# Patient Record
Sex: Male | Born: 1960 | Race: White | Hispanic: No | Marital: Single | Smoking: Never smoker
Health system: Southern US, Community
[De-identification: ages and names within clinical notes are randomized; demographics above are authoritative.]

## PROBLEM LIST (undated history)

## (undated) DIAGNOSIS — E119 Type 2 diabetes mellitus without complications: Secondary | ICD-10-CM

## (undated) DIAGNOSIS — K219 Gastro-esophageal reflux disease without esophagitis: Secondary | ICD-10-CM

## (undated) DIAGNOSIS — M1990 Unspecified osteoarthritis, unspecified site: Secondary | ICD-10-CM

## (undated) DIAGNOSIS — E7800 Pure hypercholesterolemia, unspecified: Secondary | ICD-10-CM

## (undated) DIAGNOSIS — M869 Osteomyelitis, unspecified: Secondary | ICD-10-CM

## (undated) HISTORY — PX: AMPUTATION TOE: SHX6595

## (undated) HISTORY — PX: EXTERNAL EAR SURGERY: SHX627

## (undated) HISTORY — PX: ADENOIDECTOMY: SUR15

## (undated) HISTORY — PX: ELBOW FRACTURE SURGERY: SHX616

---

## 2017-07-19 ENCOUNTER — Encounter: Payer: Self-pay | Admitting: *Deleted

## 2017-07-19 ENCOUNTER — Ambulatory Visit
Admission: EM | Admit: 2017-07-19 | Discharge: 2017-07-19 | Disposition: A | Discharge status: Home / Self Care | Payer: Managed Care, Other (non HMO) | Attending: Family | Admitting: Family

## 2017-07-19 ENCOUNTER — Ambulatory Visit (INDEPENDENT_AMBULATORY_CARE_PROVIDER_SITE_OTHER): Payer: Managed Care, Other (non HMO)

## 2017-07-19 DIAGNOSIS — L03119 Cellulitis of unspecified part of limb: Secondary | ICD-10-CM | POA: Diagnosis not present

## 2017-07-19 DIAGNOSIS — L02419 Cutaneous abscess of limb, unspecified: Secondary | ICD-10-CM

## 2017-07-19 DIAGNOSIS — E1159 Type 2 diabetes mellitus with other circulatory complications: Secondary | ICD-10-CM

## 2017-07-19 DIAGNOSIS — M79672 Pain in left foot: Secondary | ICD-10-CM | POA: Diagnosis present

## 2017-07-19 DIAGNOSIS — M79671 Pain in right foot: Secondary | ICD-10-CM | POA: Diagnosis present

## 2017-07-19 DIAGNOSIS — E11621 Type 2 diabetes mellitus with foot ulcer: Secondary | ICD-10-CM | POA: Diagnosis not present

## 2017-07-19 DIAGNOSIS — L97518 Non-pressure chronic ulcer of other part of right foot with other specified severity: Secondary | ICD-10-CM | POA: Diagnosis not present

## 2017-07-19 DIAGNOSIS — L97528 Non-pressure chronic ulcer of other part of left foot with other specified severity: Secondary | ICD-10-CM

## 2017-07-19 HISTORY — DX: Type 2 diabetes mellitus without complications: E11.9

## 2017-07-19 LAB — URINALYSIS, COMPLETE (UACMP) WITH MICROSCOPIC
Bacteria, UA: NONE SEEN
Bilirubin Urine: NEGATIVE
GLUCOSE, UA: 500 mg/dL — AB
HGB URINE DIPSTICK: NEGATIVE
Ketones, ur: NEGATIVE mg/dL
Leukocytes, UA: NEGATIVE
Nitrite: NEGATIVE
Protein, ur: 100 mg/dL — AB
RBC / HPF: NONE SEEN RBC/hpf (ref 0–5)
pH: 5 (ref 5.0–8.0)

## 2017-07-19 LAB — BASIC METABOLIC PANEL
Anion gap: 8 (ref 5–15)
BUN: 14 mg/dL (ref 6–20)
CHLORIDE: 100 mmol/L — AB (ref 101–111)
CO2: 28 mmol/L (ref 22–32)
CREATININE: 0.73 mg/dL (ref 0.61–1.24)
Calcium: 8.8 mg/dL — ABNORMAL LOW (ref 8.9–10.3)
GFR calc Af Amer: >60 mL/min (ref >60)
GFR calc non Af Amer: >60 mL/min (ref >60)
Glucose, Bld: 236 mg/dL — ABNORMAL HIGH (ref 65–99)
Potassium: 3.9 mmol/L (ref 3.5–5.1)
SODIUM: 136 mmol/L (ref 135–145)

## 2017-07-19 LAB — CBC WITH DIFFERENTIAL/PLATELET
Basophils Absolute: 0.1 10*3/uL (ref 0–0.1)
Basophils Relative: 1 %
EOS ABS: 0.2 10*3/uL (ref 0–0.7)
EOS PCT: 2 %
HCT: 38.8 % — ABNORMAL LOW (ref 40.0–52.0)
Hemoglobin: 13.6 g/dL (ref 13.0–18.0)
LYMPHS ABS: 1.9 10*3/uL (ref 1.0–3.6)
Lymphocytes Relative: 19 %
MCH: 32.1 pg (ref 26.0–34.0)
MCHC: 35.2 g/dL (ref 32.0–36.0)
MCV: 91.3 fL (ref 80.0–100.0)
MONOS PCT: 6 %
Monocytes Absolute: 0.6 10*3/uL (ref 0.2–1.0)
Neutro Abs: 7 10*3/uL — ABNORMAL HIGH (ref 1.4–6.5)
Neutrophils Relative %: 72 %
PLATELETS: 245 10*3/uL (ref 150–440)
RBC: 4.24 MIL/uL — ABNORMAL LOW (ref 4.40–5.90)
RDW: 12.6 % (ref 11.5–14.5)
WBC: 9.7 10*3/uL (ref 3.8–10.6)

## 2017-07-19 LAB — GLUCOSE, CAPILLARY: Glucose-Capillary: 224 mg/dL — ABNORMAL HIGH (ref 65–99)

## 2017-07-19 MED ORDER — MUPIROCIN 2 % EX OINT: 1.0000 "application " | TOPICAL_OINTMENT | Freq: Two times a day (BID) | CUTANEOUS | 0 refills | Status: DC

## 2017-07-19 MED ORDER — DOXYCYCLINE HYCLATE 100 MG PO TBEC: 100.0000 mg | DELAYED_RELEASE_TABLET | Freq: Two times a day (BID) | ORAL | 0 refills | Status: DC

## 2017-07-19 MED ORDER — METFORMIN HCL ER (MOD) 500 MG PO TB24: 500.0000 mg | ORAL_TABLET | Freq: Every evening | ORAL | 0 refills | Status: DC

## 2017-07-19 NOTE — ED Triage Notes (Signed)
Also, pt states pain now extends from toes up to lower third of lower legs. Both feet are red and edematous.

## 2017-07-19 NOTE — ED Provider Notes (Signed)
[facility]    CSN: [id number] Arrival date & time: 07/19/17  1751     History   Chief Complaint Chief Complaint  Patient presents with  . Foot Pain    HPI [name] is a 56 y.o. male.   Chief complaint of bilateral feet pain and swelling, one week ago, waxing and waning. Some worsening in left great toe, unchanged 'and perhaps' improvement of right toe.   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 Not itchy. Tried neosporin with temporary relief. Has been wrapping gauze and notices clear and sometimes a 'tinge of red.'  Had had 'small blisters on feet' but 'nothing like this.' Has pain in BLE when walking around. Pain improves with rest and swelling goes down over night. After wearing boots all day, swelling returns BLE.   Denies exertional chest pain or pressure, numbness or tingling radiating to left arm or jaw, palpitations, dizziness, frequent headaches, changes in vision, or shortness of breath.   Hasn't had PCP 15 years- called Dr Charisse March office and has OV next week.   Diagnosed with diabetes- 15 years ago. Endorses numbness,tingling in BLE for 4-5 years. Has lost 15 pounds over past couple of years with changes in diet. Endorses uriniating more frequency. No dysuria, fever.   No polydipsia, polyphagia.   No recent surgeries, immobilization. No h/o dvt.   No known h/o MRSA          Past Medical History:  Diagnosis Date  . Diabetes mellitus without complication (HCC)     There are no active problems to display for this patient.   Past Surgical History:  Procedure Laterality Date  . ELBOW FRACTURE SURGERY Right        Home Medications    Prior to Admission medications   Medication Sig Start Date End Date Taking? Authorizing Provider  doxycycline (DORYX) 100 MG EC tablet Take 1 tablet (100 mg total) by mouth 2 (two) times  daily. 07/19/17   Allegra Grana, FNP  metFORMIN (GLUMETZA) 500 MG (MOD) 24 hr tablet Take 1 tablet (500 mg total) by mouth every evening. Take in evening with meal. 07/19/17   Brettany Sydney, Lyn Records, FNP  mupirocin ointment (BACTROBAN) 2 % Place 1 application into the nose 2 (two) times daily. 07/19/17   Allegra Grana, FNP    Family History History reviewed. No pertinent family history.  Social History Social History  Substance Use Topics  . Smoking status: Never Smoker  . Smokeless tobacco: Never Used  . Alcohol use Yes     Allergies   Patient has no known allergies.   Review of Systems Review of Systems  Constitutional: Negative for chills and fever.  Eyes: Negative for visual disturbance.  Respiratory: Negative for cough.   Cardiovascular: Positive for leg swelling. Negative for chest pain and palpitations.  Gastrointestinal: Negative for nausea and vomiting.  Skin: Positive for rash and wound.  Neurological: Positive for numbness. Negative for headaches.     Physical Exam Triage Vital Signs ED Triage Vitals  Enc Vitals Group     BP 07/19/17 1807 (!) 146/76     Pulse Rate 07/19/17 1807 95     Resp 07/19/17 1807 16     Temp 07/19/17 1807 98.6 F (37 C)     Temp Source 07/19/17 1807 Oral     SpO2 07/19/17 1807 97 %  Weight 07/19/17 1810 245 lb (111.1 kg)     Height 07/19/17 1810 6' (1.829 m)     Head Circumference --      Peak Flow --      Pain Score 07/19/17 1810 6     Pain Loc --      Pain Edu? --      Excl. in GC? --    No data found.   Updated Vital Signs BP (!) 146/76 (BP Location: Left Arm)   Pulse 95   Temp 98.6 F (37 C) (Oral)   Resp 16   Ht 6' (1.829 m)   Wt 245 lb (111.1 kg)   SpO2 97%   BMI 33.23 kg/m   Visual Acuity Right Eye Distance:   Left Eye Distance:   Bilateral Distance:    Right Eye Near:   Left Eye Near:    Bilateral Near:     Physical Exam  Constitutional: He appears well-developed and well-nourished.    Cardiovascular: Regular rhythm and normal heart sounds.   Trace pedal , non pitting edema. No palpable cords or masses. No erythema or increased warmth. No asymmetry in calf size when compared bilaterally LE hair growth symmetric and present. No discoloration of varicosities noted. LE warm and palpable pedal pulses.   Pulmonary/Chest: Effort normal and breath sounds normal. No respiratory distress. He has no wheezes. He has no rhonchi. He has no rales.  Musculoskeletal:       Feet:  Indurated, ulcerated lesions noted medial aspect bilateral great toes.  Left 5 x 5cm Right 4x3 cm  No purulent discharge or foul smell. No streaking. No increased warmth. Localized surrounding erythema.  Lymphadenopathy:       Head (left side): No submandibular and no preauricular adenopathy present.  Neurological: He is alert.  Decreased sensation over dorsal and ventral aspect of feet.  Skin: Skin is warm and dry.  Psychiatric: He has a normal mood and affect. His speech is normal and behavior is normal.  Vitals reviewed.    UC Treatments / Results  Labs (all labs ordered are listed, but only abnormal results are displayed) Labs Reviewed  GLUCOSE, CAPILLARY - Abnormal; Notable for the following:       Result Value   Glucose-Capillary 224 (*)    All other components within normal limits  BASIC METABOLIC PANEL - Abnormal; Notable for the following:    Chloride 100 (*)    Glucose, Bld 236 (*)    Calcium 8.8 (*)    All other components within normal limits  CBC WITH DIFFERENTIAL/PLATELET - Abnormal; Notable for the following:    RBC 4.24 (*)    HCT 38.8 (*)    Neutro Abs 7.0 (*)    All other components within normal limits  URINALYSIS, COMPLETE (UACMP) WITH MICROSCOPIC - Abnormal; Notable for the following:    Specific Gravity, Urine >1.030 (*)    Glucose, UA 500 (*)    Protein, ur 100 (*)    Squamous Epithelial / LPF 0-5 (*)    All other components within normal limits  AEROBIC CULTURE  (SUPERFICIAL SPECIMEN)    EKG  EKG Interpretation None       Radiology Dg Foot Complete Left  Result Date: 07/19/2017 CLINICAL DATA:  56 year old male with history of ulcers on the great toes bilaterally for 1 week. History of diabetes. EXAM: LEFT FOOT - COMPLETE 3+ VIEW COMPARISON:  No priors. FINDINGS: Soft tissue irregularity along the plantar and medial aspect of the great  toe, corresponding to the reported soft tissue ulcer. No definite osseous destruction in the adjacent phalanges of the great toe. No acute displaced fracture, subluxation or dislocation. IMPRESSION: 1. No acute bony abnormality in the left foot. Specifically, no definite imaging findings to suggest osteomyelitis in the left great toe. Electronically Signed   By: Trudie Reed M.D.   On: 07/19/2017 19:39   Dg Foot Complete Right  Result Date: 07/19/2017 CLINICAL DATA:  Right great toe ulcer for the past week. EXAM: RIGHT FOOT COMPLETE - 3+ VIEW COMPARISON:  None. FINDINGS: Scalloped defect in the medial soft tissues of the great toe with underlying increased soft tissue density. No soft tissue gas, bone destruction or periosteal reaction. IMPRESSION: Medial great toe ulceration without plain radiographic evidence of underlying osteomyelitis. Electronically Signed   By: Beckie Salts M.D.   On: 07/19/2017 19:38    Procedures Procedures (including critical care time)  Medications Ordered in UC Medications - No data to display   Initial Impression / Assessment and Plan / UC Course  I have reviewed the triage vital signs and the nursing notes.  Pertinent labs & imaging results that were available during my care of the patient were reviewed by me and considered in my medical decision making (see chart for details).      Final Clinical Impressions(s) / UC Diagnoses   Final diagnoses:  Type 2 diabetes mellitus with other circulatory complication, unspecified whether long term insulin use (HCC)  Cellulitis and  abscess of leg  Afebrile. Reassured by x-rays which do not show any bone erosion, gas to raise suspicion osteomyelitis. White blood cells are normal. Mild elevation in neutrophils. No systemic features. At this time, suspect a localized cellulitis. We'll cover with MRSA PO agent and also topical Bactroban .Discussed with patient his results which include large amounts of glucose in urine and elevated serum glucose is diagnostic for diabetes. No significant electrolyte abnormalities. Normal renal function. We agreed to go ahead and start metformin this evening and patient wound follow-up with PCP for further titration.  Pending wound culture. Dressed wound tonight and advise daily dressing changes at home. Patient will come back for wound recheck Sunday. Return precautions given.  New Prescriptions New Prescriptions   DOXYCYCLINE (DORYX) 100 MG EC TABLET    Take 1 tablet (100 mg total) by mouth 2 (two) times daily.   METFORMIN (GLUMETZA) 500 MG (MOD) 24 HR TABLET    Take 1 tablet (500 mg total) by mouth every evening. Take in evening with meal.   MUPIROCIN OINTMENT (BACTROBAN) 2 %    Place 1 application into the nose 2 (two) times daily.     Controlled Substance Prescriptions [place] Controlled Substance Registry consulted? Not Applicable   Allegra Grana, FNP 07/19/17 2023

## 2017-07-19 NOTE — ED Triage Notes (Signed)
Pt was dx with NIDDM many years ago but hasn't seen a PCP in "15 years". 5 days ago pt developed "blisters" on both big toes which have progressively worsened to now deep thickness ulcers.

## 2017-07-19 NOTE — Discharge Instructions (Addendum)
It is most important for you to be hypervigilant to ensure infection is not worsening. Signs of increased redness, pain, swelling, purulent discharge would all be signs of worsening infection- you would need to be seen in emergency room immediately. Otherwise, come back to our clinic on Sunday for recheck to ensure antibiotics are working. Pending wound culture I also provided information for wound care clinic and I would like you to call them on Monday. Please ensure you maintain your appointment with her PCP on Tuesday as well. We'll also start metformin-this dose may be titrated by your PCP  If there is no improvement in your symptoms, or if there is any worsening of symptoms, or if you have any additional concerns, please return for re-evaluation; or, if we are closed, consider going to the Emergency Room for evaluation if symptoms urgent.

## 2017-07-21 ENCOUNTER — Telehealth (HOSPITAL_COMMUNITY): Payer: Self-pay | Admitting: Internal Medicine

## 2017-07-21 ENCOUNTER — Ambulatory Visit
Admission: EM | Admit: 2017-07-21 | Discharge: 2017-07-21 | Disposition: A | Discharge status: Home / Self Care | Payer: Managed Care, Other (non HMO) | Attending: Family Medicine | Admitting: Family Medicine

## 2017-07-21 DIAGNOSIS — L97509 Non-pressure chronic ulcer of other part of unspecified foot with unspecified severity: Secondary | ICD-10-CM

## 2017-07-21 DIAGNOSIS — E11621 Type 2 diabetes mellitus with foot ulcer: Secondary | ICD-10-CM

## 2017-07-21 DIAGNOSIS — L03119 Cellulitis of unspecified part of limb: Secondary | ICD-10-CM

## 2017-07-21 MED ORDER — AMOXICILLIN 875 MG PO TABS: 875.0000 mg | ORAL_TABLET | Freq: Two times a day (BID) | ORAL | 0 refills | Status: DC

## 2017-07-21 NOTE — ED Provider Notes (Signed)
[facility]    CSN: [id number] Arrival date & time: 07/21/17  1210     History   Chief Complaint Chief Complaint  Patient presents with  . Leg Pain    HPI [name] is a 56 y.o. male.   HPI  This a 56 year old male diabetic who was seen approximately 2 days ago for foot ulcers that he's had on both big toes. He had not been taking metformin for a while and was restarted on metformin at that visit. He was also placed on Bactroban ointment and doxycycline. Skin marker shows that the lytic demarcation  has improved. First photographs for detail.        Past Medical History:  Diagnosis Date  . Diabetes mellitus without complication (HCC)     There are no active problems to display for this patient.   Past Surgical History:  Procedure Laterality Date  . ELBOW FRACTURE SURGERY Right        Home Medications    Prior to Admission medications   Medication Sig Start Date End Date Taking? Authorizing Provider  doxycycline (DORYX) 100 MG EC tablet Take 1 tablet (100 mg total) by mouth 2 (two) times daily. 07/19/17  Yes Allegra GranaArnett, Margaret G, FNP  metFORMIN (GLUMETZA) 500 MG (MOD) 24 hr tablet Take 1 tablet (500 mg total) by mouth every evening. Take in evening with meal. 07/19/17  Yes Arnett, Lyn RecordsMargaret G, FNP  mupirocin ointment (BACTROBAN) 2 % Place 1 application into the nose 2 (two) times daily. 07/19/17  Yes Allegra GranaArnett, Margaret G, FNP    Family History No family history on file.  Social History Social History  Substance Use Topics  . Smoking status: Never Smoker  . Smokeless tobacco: Never Used  . Alcohol use Yes     Allergies   Patient has no known allergies.   Review of Systems Review of Systems  Constitutional: Positive for activity change. Negative for chills, fatigue and fever.  Skin: Positive for color change and wound.  All other systems reviewed and are negative.    Physical Exam Triage Vital Signs ED Triage Vitals  Enc Vitals  Group     BP 07/21/17 1304 139/78     Pulse Rate 07/21/17 1304 81     Resp 07/21/17 1304 16     Temp 07/21/17 1304 98.1 F (36.7 C)     Temp Source 07/21/17 1304 Oral     SpO2 07/21/17 1304 98 %     Weight 07/21/17 1302 245 lb (111.1 kg)     Height 07/21/17 1302 6' (1.829 m)     Head Circumference --      Peak Flow --      Pain Score 07/21/17 1303 5     Pain Loc --      Pain Edu? --      Excl. in GC? --    No data found.   Updated Vital Signs BP 139/78 (BP Location: Right Arm)   Pulse 81   Temp 98.1 F (36.7 C) (Oral)   Resp 16   Ht 6' (1.829 m)   Wt 245 lb (111.1 kg)   SpO2 98%   BMI 33.23 kg/m   Visual Acuity Right Eye Distance:   Left Eye Distance:   Bilateral Distance:    Right Eye Near:   Left Eye Near:    Bilateral Near:     Physical Exam  Constitutional: He is oriented to person, place, and time. He appears well-developed and well-nourished. No  distress.  HENT:  Head: Normocephalic.  Eyes: Pupils are equal, round, and reactive to light.  Neck: Normal range of motion.  Musculoskeletal: Normal range of motion. He exhibits edema and tenderness.  Neurological: He is alert and oriented to person, place, and time.  Skin: Skin is warm and dry. He is not diaphoretic. There is erythema.  Refer to photographs for detail  Psychiatric: He has a normal mood and affect. His behavior is normal. Judgment and thought content normal.  Nursing note and vitals reviewed.          UC Treatments / Results  Labs (all labs ordered are listed, but only abnormal results are displayed) Labs Reviewed - No data to display  EKG  EKG Interpretation None       Radiology Dg Foot Complete Left  Result Date: 07/19/2017 CLINICAL DATA:  56 year old male with history of ulcers on the great toes bilaterally for 1 week. History of diabetes. EXAM: LEFT FOOT - COMPLETE 3+ VIEW COMPARISON:  No priors. FINDINGS: Soft tissue irregularity along the plantar and medial aspect of  the great toe, corresponding to the reported soft tissue ulcer. No definite osseous destruction in the adjacent phalanges of the great toe. No acute displaced fracture, subluxation or dislocation. IMPRESSION: 1. No acute bony abnormality in the left foot. Specifically, no definite imaging findings to suggest osteomyelitis in the left great toe. Electronically Signed   By: Trudie Reed M.D.   On: 07/19/2017 19:39   Dg Foot Complete Right  Result Date: 07/19/2017 CLINICAL DATA:  Right great toe ulcer for the past week. EXAM: RIGHT FOOT COMPLETE - 3+ VIEW COMPARISON:  None. FINDINGS: Scalloped defect in the medial soft tissues of the great toe with underlying increased soft tissue density. No soft tissue gas, bone destruction or periosteal reaction. IMPRESSION: Medial great toe ulceration without plain radiographic evidence of underlying osteomyelitis. Electronically Signed   By: Beckie Salts M.D.   On: 07/19/2017 19:38    Procedures Procedures (including critical care time)  Medications Ordered in UC Medications - No data to display   Initial Impression / Assessment and Plan / UC Course  I have reviewed the triage vital signs and the nursing notes.  Pertinent labs & imaging results that were available during my care of the patient were reviewed by me and considered in my medical decision making (see chart for details).  Plan: 1. Test/x-ray results and diagnosis reviewed with patient 2. rx as per orders; risks, benefits, potential side effects reviewed with patient 3. Recommend supportive treatment with continuing his present therapy. Patient is has an appointment with [facility] on Tuesday of this week.They Will assume his care from then on. 4. F/u prn if symptoms worsen or don't improve         Final Clinical Impressions(s) / UC Diagnoses   Final diagnoses:  Cellulitis of upper extremity, unspecified laterality  Type 2 diabetes mellitus with foot ulcer,  unspecified whether long term insulin use (HCC)    New Prescriptions Discharge Medication List as of 07/21/2017  2:03 PM       Controlled Substance Prescriptions [place] Controlled Substance Registry consulted? Not Applicable   Lutricia Feil, PA-C 07/21/17 1417

## 2017-07-21 NOTE — ED Triage Notes (Signed)
Follow up after antibiotics Rx toe pain improved  Little.

## 2017-07-21 NOTE — Telephone Encounter (Signed)
Clinical staff, please let patient know that wound culture from toe ulcer was positive for Strep germ.  Strep is not uniformly sensitive to doxycycline, so would add amoxicillin.  Rx amoxicillin [place] [facility] [place] [place].  Finish doxycycline and amoxicillin and followup with PCP and with wound center as discussed at urgent care visit 8/10.  LM

## 2017-07-23 ENCOUNTER — Encounter: Payer: Self-pay | Admitting: Family Medicine

## 2017-07-23 ENCOUNTER — Ambulatory Visit (INDEPENDENT_AMBULATORY_CARE_PROVIDER_SITE_OTHER): Payer: Managed Care, Other (non HMO) | Admitting: Family Medicine

## 2017-07-23 VITALS — BP 136/81 | HR 74 | Temp 98.3°F | Ht 70.25 in | Wt 245.0 lb

## 2017-07-23 DIAGNOSIS — E1165 Type 2 diabetes mellitus with hyperglycemia: Secondary | ICD-10-CM | POA: Diagnosis not present

## 2017-07-23 DIAGNOSIS — Z1159 Encounter for screening for other viral diseases: Secondary | ICD-10-CM | POA: Diagnosis not present

## 2017-07-23 DIAGNOSIS — L97509 Non-pressure chronic ulcer of other part of unspecified foot with unspecified severity: Secondary | ICD-10-CM | POA: Diagnosis not present

## 2017-07-23 DIAGNOSIS — Z114 Encounter for screening for human immunodeficiency virus [HIV]: Secondary | ICD-10-CM

## 2017-07-23 DIAGNOSIS — Z23 Encounter for immunization: Secondary | ICD-10-CM | POA: Diagnosis not present

## 2017-07-23 DIAGNOSIS — E11621 Type 2 diabetes mellitus with foot ulcer: Secondary | ICD-10-CM

## 2017-07-23 DIAGNOSIS — Z7689 Persons encountering health services in other specified circumstances: Secondary | ICD-10-CM

## 2017-07-23 DIAGNOSIS — R21 Rash and other nonspecific skin eruption: Secondary | ICD-10-CM

## 2017-07-23 LAB — UA/M W/RFLX CULTURE, ROUTINE
BILIRUBIN UA: NEGATIVE
GLUCOSE, UA: NEGATIVE
KETONES UA: NEGATIVE
Leukocytes, UA: NEGATIVE
NITRITE UA: NEGATIVE
RBC UA: NEGATIVE
Specific Gravity, UA: >1.03 — ABNORMAL HIGH (ref 1.005–1.030)
UUROB: 0.2 mg/dL (ref 0.2–1.0)
pH, UA: 5 (ref 5.0–7.5)

## 2017-07-23 LAB — AEROBIC CULTURE  (SUPERFICIAL SPECIMEN)

## 2017-07-23 LAB — AEROBIC CULTURE W GRAM STAIN (SUPERFICIAL SPECIMEN)

## 2017-07-23 LAB — MICROSCOPIC EXAMINATION: Bacteria, UA: NONE SEEN

## 2017-07-23 LAB — MICROALBUMIN, URINE WAIVED
CREATININE, URINE WAIVED: 200 mg/dL (ref 10–300)
Microalb, Ur Waived: 150 mg/L — ABNORMAL HIGH (ref 0–19)

## 2017-07-23 MED ORDER — AZITHROMYCIN 250 MG PO TABS: ORAL_TABLET | ORAL | 0 refills | Status: DC

## 2017-07-23 NOTE — Progress Notes (Signed)
BP 136/81   Pulse 74   Temp 98.3 F (36.8 C)   Ht 5' 10.25" (1.784 m)   Wt 245 lb (111.1 kg)   SpO2 96%   BMI 34.90 kg/m    Subjective:    Patient ID: [name], male    DOB: [birth year], 56 y.o.   MRN: [id number]  HPI: [name] is a 56 y.o. male  Chief Complaint  Patient presents with  . Establish Care  . Diabetes    was restarted on Metformin at [facility], had been off of Metformin since he was last seen here in 2006.  . Toe Ulcers    bilateral great toe ulcers that were treated to UC. States they looks like they are healing.  . Rash    came up on both forearms last night, itches   Patient presents today to re-establish care after being lost to follow up for 12 years. Hx of DM, previously under good control per last office notes. Has been off all medications the entire 12 years and has not been following any particular diet. Presented to UC last week for ulcerations of b/l great toes that were pretty extensive. Was started on abx and instructed to f/u with PCP. Was restarted on metformin, 500 mg XR, at UC.   Also notes a rash and itching on b/l wrists and forearms since last night after starting amoxicillin. No known bites or other changes. Has not tried anything on the areas. Denies SOB, throat swelling, chest tightness, other rashes.    Relevant past medical, surgical, family and social history reviewed and updated as indicated. Interim medical history since our last visit reviewed. Allergies and medications reviewed and updated.  Review of Systems  Constitutional: Negative.   HENT: Negative.   Respiratory: Negative.   Cardiovascular: Negative.   Gastrointestinal: Negative.   Genitourinary: Negative.   Musculoskeletal: Positive for arthralgias (toe pain).  Skin: Positive for rash and wound.  Neurological: Negative.   Psychiatric/Behavioral: Negative.    Per HPI unless specifically indicated above     Objective:    BP 136/81   Pulse 74   Temp 98.3 F (36.8  C)   Ht 5' 10.25" (1.784 m)   Wt 245 lb (111.1 kg)   SpO2 96%   BMI 34.90 kg/m   Wt Readings from Last 3 Encounters:  07/23/17 245 lb (111.1 kg)  07/21/17 245 lb (111.1 kg)  07/19/17 245 lb (111.1 kg)    Physical Exam  Constitutional: He is oriented to person, place, and time. No distress.  Overweight  HENT:  Head: Atraumatic.  Eyes: Pupils are equal, round, and reactive to light. Conjunctivae are normal.  Neck: Normal range of motion. Neck supple.  Cardiovascular: Normal rate and normal heart sounds.   Pulmonary/Chest: Effort normal and breath sounds normal. No respiratory distress.  Musculoskeletal: Normal range of motion.  Neurological: He is alert and oriented to person, place, and time. No cranial nerve deficit.  Skin: Skin is warm and dry.  Diffuse, erythematous macular rash and edema on b/l wrists and forearms  B/l great toes with significant ulceration from calluses from work boots  Psychiatric: He has a normal mood and affect. His behavior is normal.  Nursing note and vitals reviewed.  Results for orders placed or performed in visit on 07/23/17  Microscopic Examination  Result Value Ref Range   WBC, UA 0-5 0 - 5 /hpf   RBC, UA 0-2 0 - 2 /hpf   Epithelial Cells (non renal)  0-10 0 - 10 /hpf   Bacteria, UA None seen None seen/Few  Comprehensive metabolic panel  Result Value Ref Range   Glucose 172 (H) 65 - 99 mg/dL   BUN 14 6 - 24 mg/dL   Creatinine, Ser 1.610.70 (L) 0.76 - 1.27 mg/dL   GFR calc non Af Amer 107 >59 mL/min/1.73   GFR calc Af Amer 124 >59 mL/min/1.73   BUN/Creatinine Ratio 20 9 - 20   Sodium 139 134 - 144 mmol/L   Potassium 4.3 3.5 - 5.2 mmol/L   Chloride 99 96 - 106 mmol/L   CO2 24 20 - 29 mmol/L   Calcium 8.9 8.7 - 10.2 mg/dL   Total Protein 6.6 6.0 - 8.5 g/dL   Albumin 4.0 3.5 - 5.5 g/dL   Globulin, Total 2.6 1.5 - 4.5 g/dL   Albumin/Globulin Ratio 1.5 1.2 - 2.2   Bilirubin Total 0.4 0.0 - 1.2 mg/dL   Alkaline Phosphatase 53 39 - 117 IU/L    AST 23 0 - 40 IU/L   ALT 21 0 - 44 IU/L  HIV antibody  Result Value Ref Range   HIV Screen 4th Generation wRfx Non Reactive Non Reactive  HgB A1c  Result Value Ref Range   Hgb A1c MFr Bld 10.2 (H) 4.8 - 5.6 %   Est. average glucose Bld gHb Est-mCnc 246 mg/dL  Hepatitis C Antibody  Result Value Ref Range   Hep C Virus Ab <0.1 0.0 - 0.9 s/co ratio  Microalbumin, Urine Waived  Result Value Ref Range   Microalb, Ur Waived 150 (H) 0 - 19 mg/L   Creatinine, Urine Waived 200 10 - 300 mg/dL   Microalb/Creat Ratio 30-300 (H) <30 mg/g  UA/M w/rflx Culture, Routine  Result Value Ref Range   Specific Gravity, UA >1.030 (H) 1.005 - 1.030   pH, UA 5.0 5.0 - 7.5   Color, UA Yellow Yellow   Appearance Ur Clear Clear   Leukocytes, UA Negative Negative   Protein, UA 2+ (A) Negative/Trace   Glucose, UA Negative Negative   Ketones, UA Negative Negative   RBC, UA Negative Negative   Bilirubin, UA Negative Negative   Urobilinogen, Ur 0.2 0.2 - 1.0 mg/dL   Nitrite, UA Negative Negative   Microscopic Examination See below:       Assessment & Plan:   Problem List Items Addressed This Visit      Endocrine   Type 2 diabetes mellitus with hyperglycemia (HCC)    Will get an A1C today, increase medications as tolerated until at goal. Discussed lifestyle center for lifestyle education. Pt wanting to try to make modifications independently first. Good dietary and exercise modifications reviewed.       Relevant Orders   Comprehensive metabolic panel (Completed)   HgB A1c (Completed)   Microalbumin, Urine Waived (Completed)   UA/M w/rflx Culture, Routine (Completed)   Microscopic Examination (Completed)    Other Visit Diagnoses    Encounter to establish care    -  Primary   Will do full physical at 3 month f/u as pt has not been seen for so long. Will come fasting for labs. 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 Will write him out of work until then as he has to wear steel toe boots for work. Cont wound care  Relevant Orders   Ambulatory referral to Podiatry   Need for hepatitis C screening test       Relevant Orders   Hepatitis C Antibody (Completed)   Encounter for screening for HIV       Relevant Orders   HIV antibody (Completed)   Need for Tdap vaccination       Relevant Orders   Tdap vaccine greater than or equal to 7yo IM (Completed)   Rash       Will switch from amoxil to zpack in case sensitivity rash. Benadryl BID, f/u if not improving.        Follow up plan: Return in about 3 months (around 10/23/2017) for DM, CPE.

## 2017-07-24 ENCOUNTER — Telehealth: Payer: Self-pay

## 2017-07-24 ENCOUNTER — Telehealth: Payer: Self-pay | Admitting: Family Medicine

## 2017-07-24 LAB — COMPREHENSIVE METABOLIC PANEL
A/G RATIO: 1.5 (ref 1.2–2.2)
ALK PHOS: 53 IU/L (ref 39–117)
ALT: 21 IU/L (ref 0–44)
AST: 23 IU/L (ref 0–40)
Albumin: 4 g/dL (ref 3.5–5.5)
BILIRUBIN TOTAL: 0.4 mg/dL (ref 0.0–1.2)
BUN/Creatinine Ratio: 20 (ref 9–20)
BUN: 14 mg/dL (ref 6–24)
CALCIUM: 8.9 mg/dL (ref 8.7–10.2)
CO2: 24 mmol/L (ref 20–29)
Chloride: 99 mmol/L (ref 96–106)
Creatinine, Ser: 0.7 mg/dL — ABNORMAL LOW (ref 0.76–1.27)
GFR calc Af Amer: 124 mL/min/{1.73_m2} (ref >59)
GFR, EST NON AFRICAN AMERICAN: 107 mL/min/{1.73_m2} (ref >59)
GLOBULIN, TOTAL: 2.6 g/dL (ref 1.5–4.5)
Glucose: 172 mg/dL — ABNORMAL HIGH (ref 65–99)
Potassium: 4.3 mmol/L (ref 3.5–5.2)
Sodium: 139 mmol/L (ref 134–144)
Total Protein: 6.6 g/dL (ref 6.0–8.5)

## 2017-07-24 LAB — HEMOGLOBIN A1C
ESTIMATED AVERAGE GLUCOSE: 246 mg/dL
HEMOGLOBIN A1C: 10.2 % — AB (ref 4.8–5.6)

## 2017-07-24 LAB — HEPATITIS C ANTIBODY

## 2017-07-24 LAB — HIV ANTIBODY (ROUTINE TESTING W REFLEX): HIV SCREEN 4TH GENERATION: NONREACTIVE

## 2017-07-24 MED ORDER — CANAGLIFLOZIN 100 MG PO TABS: 100.0000 mg | ORAL_TABLET | Freq: Every day | ORAL | 3 refills | Status: DC

## 2017-07-24 MED ORDER — DAPAGLIFLOZIN PROPANEDIOL 5 MG PO TABS: 5.0000 mg | ORAL_TABLET | Freq: Every day | ORAL | 3 refills | Status: DC

## 2017-07-24 MED ORDER — METFORMIN HCL 500 MG PO TABS: 500.0000 mg | ORAL_TABLET | Freq: Two times a day (BID) | ORAL | 3 refills | Status: DC

## 2017-07-24 NOTE — Telephone Encounter (Signed)
Left message to call.

## 2017-07-24 NOTE — Assessment & Plan Note (Signed)
Will get an A1C today, increase medications as tolerated until at goal. Discussed lifestyle center for lifestyle education. Pt wanting to try to make modifications independently first. Good dietary and exercise modifications reviewed.

## 2017-07-24 NOTE — Patient Instructions (Signed)
Follow up in 3 months

## 2017-07-24 NOTE — Telephone Encounter (Signed)
Also needs to have his appt changed to a CPE/DM f/u.

## 2017-07-24 NOTE — Telephone Encounter (Signed)
Patient notified of lab results and med changes.  Also scheduled him for CPE/DM f/u.

## 2017-07-24 NOTE — Telephone Encounter (Signed)
Insurance will not cover Invokana, the suggested alternatives are [place]ardiance or [place].

## 2017-07-24 NOTE — Telephone Encounter (Signed)
Script changed to [employer]

## 2017-07-24 NOTE — Telephone Encounter (Signed)
Please call and let him know that his A1C was 10.2. I would like to change his metformin to 500 twice daily and add invokana once daily. We will recheck in 3 months and increase as needed. Will also discuss at that time getting over to the lifestyle center to work with the educators there. Continue lifestyle modifications. Other labs looked normal

## 2017-07-29 ENCOUNTER — Telehealth: Payer: Self-pay | Admitting: Family Medicine

## 2017-07-29 NOTE — Telephone Encounter (Signed)
We did not write that Rx. It looks like it came from the FNP at the urgent care. However, it also looks like they were covering for a bacteria that didn't grow out in the culture. So, he can stop it.

## 2017-07-29 NOTE — Telephone Encounter (Signed)
Called and let patient know what Dr. Johnson said.  

## 2017-07-29 NOTE — Telephone Encounter (Signed)
Patient has been using medication that he as prescribed for his nose bactroban but has been experiencing nose bleeds when he blows his nose which makes it last for a few minutes  Please advise.  Thank you  [phone]

## 2017-07-29 NOTE — Telephone Encounter (Signed)
Routing to provider  

## 2017-08-05 ENCOUNTER — Encounter: Payer: Self-pay | Admitting: Podiatry

## 2017-08-05 ENCOUNTER — Ambulatory Visit (INDEPENDENT_AMBULATORY_CARE_PROVIDER_SITE_OTHER): Payer: Managed Care, Other (non HMO) | Admitting: Podiatry

## 2017-08-05 DIAGNOSIS — E11621 Type 2 diabetes mellitus with foot ulcer: Secondary | ICD-10-CM | POA: Diagnosis not present

## 2017-08-05 DIAGNOSIS — L03039 Cellulitis of unspecified toe: Secondary | ICD-10-CM

## 2017-08-05 DIAGNOSIS — L97501 Non-pressure chronic ulcer of other part of unspecified foot limited to breakdown of skin: Secondary | ICD-10-CM | POA: Diagnosis not present

## 2017-08-05 DIAGNOSIS — L02619 Cutaneous abscess of unspecified foot: Secondary | ICD-10-CM

## 2017-08-05 MED ORDER — DOXYCYCLINE HYCLATE 100 MG PO TABS: 100.0000 mg | ORAL_TABLET | Freq: Two times a day (BID) | ORAL | 0 refills | Status: DC

## 2017-08-05 NOTE — Progress Notes (Signed)
   Subjective:    Patient ID: [name], male    DOB: 03-10-1961, 56 y.o.   MRN: [id number]  HPI care of himself and is now taking this diabetic patient presents to the office with chief complaint of a skin lesions on both big toes.  This patient states that he has been a diabetic but he has not taking care of himself for years.  He says that he purchased a new pair of shoes. 3 weeks ago which cause blisters on both big toes.  He says both big toes. Then became infected. He says he was evaluated by Aldine Contes who treated him with antibiotics.  He was prescribed doxycycline.  He also describes taking amoxicillin which she believes caused a skin reaction.  He says he has been soaking his feet and bandaging his feet.  He says both his toes are improving and the left big toe, which was worse has become much smaller in size.  This patient says he is taking medicine for his diabetes.  He presents the office today for follow-up for an evaluation of the healing of these 2 diabetic ulcers.    Review of Systems  Constitutional: Positive for unexpected weight change.  Skin: Positive for wound.       Objective:   Physical Exam GENERAL APPEARANCE: Alert, conversant. Appropriately groomed. No acute distress.  VASCULAR: Pedal pulses are  palpable at  [facility] and PT bilateral.  Capillary refill time is immediate to all digits,  Normal temperature gradient.   NEUROLOGIC: LOPS absent toes  B/L  Lops diminished feet  B/L MUSCULOSKELETAL: acceptable muscle strength, tone and stability bilateral.  Intrinsic muscluature intact bilateral.  Rectus appearance of foot and digits noted bilateral.  ULCER  Healing ulcer right hallux covered with hyperkeratotic tissue.  No evidence of infection.  Left hallux is enlarged secondary to infection.  Desquamation noted left hallux.  There is a plantar ulcer measuring 10 mm x 10 mm. No drainage or infection noted.   DERMATOLOGIC: skin color, texture, and turgor are within normal  limits.  No preulcerative lesions or ulcers  are seen, no interdigital maceration noted.  No open lesions present.  Digital nails are asymptomatic. No drainage noted.         Assessment & Plan:  Diabetic ulcers great toes  B/L  Cellulitis hallux  B/L  IE  debridement of necrotic callous tissue on the hallux both feet.  Neosporin and dry sterile dressing was applied to both feet.  He was given home instructions for soaks.  He was prescribed doxycycline to take for the next 10 days.  Return to the clinic in 2 weeks for further evaluation and treatment.  Patient has done well in the last 3 weeks and the 2 ulcers which were seen in the previous note have healed and he is doing much better.  I am concerned that he does have a loss of pressure sensation in the digits, both feet.  Therefore, a gel pad was dispensed to be worn over the bandages on both great toes.  If this condition worsens or becomes very painful, the patient was told to contact this office or go to the Emergency Department at the hospital.  Helane Gunther DPM

## 2017-08-14 ENCOUNTER — Ambulatory Visit (INDEPENDENT_AMBULATORY_CARE_PROVIDER_SITE_OTHER): Payer: Managed Care, Other (non HMO) | Admitting: Family Medicine

## 2017-08-14 ENCOUNTER — Encounter: Payer: Self-pay | Admitting: Family Medicine

## 2017-08-14 VITALS — BP 120/74 | HR 87 | Temp 98.3°F | Ht 70.25 in | Wt 236.0 lb

## 2017-08-14 DIAGNOSIS — E1165 Type 2 diabetes mellitus with hyperglycemia: Secondary | ICD-10-CM

## 2017-08-14 MED ORDER — EMPAGLIFLOZIN 10 MG PO TABS: 10.0000 mg | ORAL_TABLET | Freq: Every day | ORAL | 3 refills | Status: DC

## 2017-08-14 NOTE — Patient Instructions (Signed)
Follow up 3 months from starting the medication

## 2017-08-14 NOTE — Progress Notes (Signed)
BP 120/74   Pulse 87   Temp 98.3 F (36.8 C)   Ht 5' 10.25" (1.784 m)   Wt 236 lb (107 kg)   SpO2 97%   BMI 33.62 kg/m    Subjective:    Patient ID: [name], male    DOB: 03/23/1961, 56 y.o.   MRN: [id number]  HPI: [name] is a 56 y.o. male  Chief Complaint  Patient presents with  . Annual Exam  . Diabetes    he could not afford the Farxiga   Pt was to have followed up in 3 months for CPE and DM f/u after starting farxiga. Came in after only one month and after not having been able to add on the medicine due to cost/coverage. Will have him come back in 3 months from starting jardiance for CPE.   Relevant past medical, surgical, family and social history reviewed and updated as indicated. Interim medical history since our last visit reviewed. Allergies and medications reviewed and updated.  Review of Systems  Per HPI unless specifically indicated above     Objective:    BP 120/74   Pulse 87   Temp 98.3 F (36.8 C)   Ht 5' 10.25" (1.784 m)   Wt 236 lb (107 kg)   SpO2 97%   BMI 33.62 kg/m   Wt Readings from Last 3 Encounters:  08/14/17 236 lb (107 kg)  07/23/17 245 lb (111.1 kg)  07/21/17 245 lb (111.1 kg)    Physical Exam  Results for orders placed or performed in visit on 07/23/17  Microscopic Examination  Result Value Ref Range   WBC, UA 0-5 0 - 5 /hpf   RBC, UA 0-2 0 - 2 /hpf   Epithelial Cells (non renal) 0-10 0 - 10 /hpf   Bacteria, UA None seen None seen/Few  Comprehensive metabolic panel  Result Value Ref Range   Glucose 172 (H) 65 - 99 mg/dL   BUN 14 6 - 24 mg/dL   Creatinine, Ser 1.470.70 (L) 0.76 - 1.27 mg/dL   GFR calc non Af Amer 107 >59 mL/min/1.73   GFR calc Af Amer 124 >59 mL/min/1.73   BUN/Creatinine Ratio 20 9 - 20   Sodium 139 134 - 144 mmol/L   Potassium 4.3 3.5 - 5.2 mmol/L   Chloride 99 96 - 106 mmol/L   CO2 24 20 - 29 mmol/L   Calcium 8.9 8.7 - 10.2 mg/dL   Total Protein 6.6 6.0 - 8.5 g/dL   Albumin 4.0 3.5 - 5.5  g/dL   Globulin, Total 2.6 1.5 - 4.5 g/dL   Albumin/Globulin Ratio 1.5 1.2 - 2.2   Bilirubin Total 0.4 0.0 - 1.2 mg/dL   Alkaline Phosphatase 53 39 - 117 IU/L   AST 23 0 - 40 IU/L   ALT 21 0 - 44 IU/L  HIV antibody  Result Value Ref Range   HIV Screen 4th Generation wRfx Non Reactive Non Reactive  HgB A1c  Result Value Ref Range   Hgb A1c MFr Bld 10.2 (H) 4.8 - 5.6 %   Est. average glucose Bld gHb Est-mCnc 246 mg/dL  Hepatitis C Antibody  Result Value Ref Range   Hep C Virus Ab <0.1 0.0 - 0.9 s/co ratio  Microalbumin, Urine Waived  Result Value Ref Range   Microalb, Ur Waived 150 (H) 0 - 19 mg/L   Creatinine, Urine Waived 200 10 - 300 mg/dL   Microalb/Creat Ratio 30-300 (H) <30 mg/g  UA/M w/rflx Culture, Routine  Result Value Ref Range   Specific Gravity, UA >1.030 (H) 1.005 - 1.030   pH, UA 5.0 5.0 - 7.5   Color, UA Yellow Yellow   Appearance Ur Clear Clear   Leukocytes, UA Negative Negative   Protein, UA 2+ (A) Negative/Trace   Glucose, UA Negative Negative   Ketones, UA Negative Negative   RBC, UA Negative Negative   Bilirubin, UA Negative Negative   Urobilinogen, Ur 0.2 0.2 - 1.0 mg/dL   Nitrite, UA Negative Negative   Microscopic Examination See below:       Assessment & Plan:   Problem List Items Addressed This Visit      Endocrine   Type 2 diabetes mellitus with hyperglycemia (HCC) - Primary    Will send in jardiance as the other two haven't been covered. Follow up 3 months from starting the medicine for recheck and CPE      Relevant Medications   empagliflozin (JARDIANCE) 10 MG TABS tablet       Follow up plan: Return in about 3 months (around 11/13/2017) for CPE, DM.

## 2017-08-14 NOTE — Assessment & Plan Note (Signed)
Will send in jardiance as the other two haven't been covered. Follow up 3 months from starting the medicine for recheck and CPE

## 2017-08-19 ENCOUNTER — Ambulatory Visit (INDEPENDENT_AMBULATORY_CARE_PROVIDER_SITE_OTHER): Payer: Managed Care, Other (non HMO) | Admitting: Podiatry

## 2017-08-19 ENCOUNTER — Encounter: Payer: Self-pay | Admitting: Podiatry

## 2017-08-19 DIAGNOSIS — L97501 Non-pressure chronic ulcer of other part of unspecified foot limited to breakdown of skin: Secondary | ICD-10-CM | POA: Diagnosis not present

## 2017-08-19 DIAGNOSIS — E11621 Type 2 diabetes mellitus with foot ulcer: Secondary | ICD-10-CM

## 2017-08-19 NOTE — Progress Notes (Signed)
This patient presents to the office for follow-up evaluation of diabetic ulcers on both big toes.  He was seen about 2 weeks ago and treated with debridement and doxycycline.  His right big toes causing no pain and discomfort as he walks and wears his shoes.  His left diabetic ulcer remains open and causing mild drainage  and is still open.  .  The infection in his left big toe has reduction in swelling and pain.  Peeling his noted around the left hallux.  He presents the office today for an evaluation and treatment of these 2 diabetic ulcers   GENERAL APPEARANCE: Alert, conversant. Appropriately groomed. No acute distress.  VASCULAR: Pedal [facility]  [name].  Capillary refill time is immediate to all digits,  Normal temperature gradient.   NEUROLOGIC: sensation is absent  to 5.07 monofilament at 5/5 sites bilateral.  Light touch is absent  bilateral, Muscle strength normal.  MUSCULOSKELETAL: acceptable muscle strength, tone and stability bilateral.  Intrinsic muscluature intact bilateral.  Rectus appearance of foot and digits noted bilateral.  ULCER healing diabetic ulcer right hallux. Left hallux has reduced swelling and desquamation noted. There is diabetic ulcer still measures 6 mm x 10 mm.  No drainage or infection noted DERMATOLOGIC: skin color, texture, and turgor are within normal limits.  No preulcerative lesions or ulcers  are seen, no interdigital maceration noted.  No open lesions present.  Digital nails are asymptomatic. No drainage noted.  Diabetic ulcer left hallux.  Return office visit. Debridement of necrotic tissue noted to the left hallux. Patient was told to continue soaks and bandaging on his left hallux.  He is to return the office in 3 weeks for further evaluation and treatment.  We should consider diabetic shoes for this patient if he qualifies     Helane GuntherGregory Mahima Hottle DPM

## 2017-09-09 ENCOUNTER — Ambulatory Visit: Payer: Managed Care, Other (non HMO) | Admitting: Podiatry

## 2017-10-24 ENCOUNTER — Ambulatory Visit: Payer: Managed Care, Other (non HMO) | Admitting: Family Medicine

## 2018-01-08 ENCOUNTER — Telehealth: Payer: Self-pay | Admitting: Family Medicine

## 2018-01-08 NOTE — Telephone Encounter (Signed)
Pt overdue for CPE and A1C recheck - [employer] has sent an overdue notice, can we reach out and try to get him scheduled?

## 2018-01-08 NOTE — Telephone Encounter (Signed)
Spoke with the patient. He states he will call back in a week or so to schedule the appointment   Just FYI  Thanks

## 2018-02-05 ENCOUNTER — Ambulatory Visit (INDEPENDENT_AMBULATORY_CARE_PROVIDER_SITE_OTHER): Payer: BLUE CROSS/BLUE SHIELD | Admitting: Family Medicine

## 2018-02-05 ENCOUNTER — Encounter: Payer: Self-pay | Admitting: Family Medicine

## 2018-02-05 VITALS — BP 138/76 | HR 92 | Temp 98.1°F | Ht 69.5 in | Wt 256.1 lb

## 2018-02-05 DIAGNOSIS — E1165 Type 2 diabetes mellitus with hyperglycemia: Secondary | ICD-10-CM | POA: Diagnosis not present

## 2018-02-05 DIAGNOSIS — L97521 Non-pressure chronic ulcer of other part of left foot limited to breakdown of skin: Secondary | ICD-10-CM | POA: Diagnosis not present

## 2018-02-05 LAB — BAYER DCA HB A1C WAIVED: HB A1C: 8.9 % — AB (ref <7.0)

## 2018-02-05 MED ORDER — METFORMIN HCL 1000 MG PO TABS: 1000.0000 mg | ORAL_TABLET | Freq: Two times a day (BID) | ORAL | 1 refills | Status: DC

## 2018-02-05 MED ORDER — MUPIROCIN 2 % EX OINT: 1.0000 "application " | TOPICAL_OINTMENT | Freq: Two times a day (BID) | CUTANEOUS | 1 refills | Status: DC

## 2018-02-05 MED ORDER — GLIPIZIDE 5 MG PO TABS: 5.0000 mg | ORAL_TABLET | Freq: Two times a day (BID) | ORAL | 3 refills | Status: DC

## 2018-02-05 NOTE — Progress Notes (Signed)
BP 138/76 (BP Location: Left Arm, Patient Position: Sitting, Cuff Size: Normal)   Pulse 92   Temp 98.1 F (36.7 C) (Oral)   Ht 5' 9.5" (1.765 m)   Wt 256 lb 1.6 oz (116.2 kg)   SpO2 96%   BMI 37.28 kg/m    Subjective:    Patient ID: [name], male    DOB: 10/12/1961, 57 y.o.   MRN: [id number]  HPI: [name] is a 57 y.o. male  Chief Complaint  Patient presents with  . Diabetes    F/U. Has not taken Jardiance due to price. Only taking metformin.   Pt here for DM f/u. Currently taking metformin 500 mg BID with no problems. Compliant with medication, no reported side effects or low blood sugar spells. Does not check home BS's. Last A1C was 10.2 in 07/2017. Pt was unable to start Jardiance or other related medications at that time due to coverage. Has ben working diligently on dietary changes, particularly cutting out all sodas and sweet teas.   Had multiple foot ulcers, has been treating at home with bactroban ointment. Right great toe has healed completely per pt, left great toe slowly healing. Minimal pain in the area, occasional drainage, no fevers, streaking, sweats.   Past Medical History:  Diagnosis Date  . Diabetes mellitus without complication [facility](HCC)    Social History   Socioeconomic History  . Marital status: Single    Spouse name: Not on file  . Number of children: Not on file  . Years of education: Not on file  . Highest education level: Not on file  Social Needs  . Financial resource strain: Not on file  . Food insecurity - worry: Not on file  . Food insecurity - inability: Not on file  . Transportation needs - medical: Not on file  . Transportation needs - non-medical: Not on file  Occupational History  . Not on file  Tobacco Use  . Smoking status: Never Smoker  . Smokeless tobacco: Never Used  Substance and Sexual Activity  . Alcohol use: Yes    Comment: occasionally  . Drug use: No  . Sexual activity: Not on file  Other Topics Concern  .  Not on file  Social History Narrative  . Not on file   Relevant past medical, surgical, family and social history reviewed and updated as indicated. Interim medical history since our last visit reviewed. Allergies and medications reviewed and updated.  Review of Systems  Per HPI unless specifically indicated above     Objective:    BP 138/76 (BP Location: Left Arm, Patient Position: Sitting, Cuff Size: Normal)   Pulse 92   Temp 98.1 F (36.7 C) (Oral)   Ht 5' 9.5" (1.765 m)   Wt 256 lb 1.6 oz (116.2 kg)   SpO2 96%   BMI 37.28 kg/m   Wt Readings from Last 3 Encounters:  02/05/18 256 lb 1.6 oz (116.2 kg)  08/14/17 236 lb (107 kg)  07/23/17 245 lb (111.1 kg)    Physical Exam  Constitutional: He is oriented to person, place, and time. He appears well-developed and well-nourished. No distress.  HENT:  Head: Atraumatic.  Eyes: Conjunctivae are normal. Pupils are equal, round, and reactive to light.  Neck: Normal range of motion. Neck supple.  Cardiovascular: Normal rate, normal heart sounds and intact distal pulses.  Pulmonary/Chest: Effort normal and breath sounds normal. No respiratory distress.  Musculoskeletal: Normal range of motion.  Neurological: He is alert and oriented to  person, place, and time.  Skin: Skin is warm and dry.  Left great toe with healing ulcer, minimal crevice along center of wound, no active draining or apparent infection. Minimal ttp over wound  Psychiatric: He has a normal mood and affect. His behavior is normal.  Nursing note and vitals reviewed.   Results for orders placed or performed in visit on 02/05/18  Bayer DCA Hb A1c Waived  Result Value Ref Range   Bayer DCA Hb A1c Waived 8.9 (H) <7.0 %  Basic metabolic panel  Result Value Ref Range   Glucose 295 (H) 65 - 99 mg/dL   BUN 19 6 - 24 mg/dL   Creatinine, Ser 4.09 0.76 - 1.27 mg/dL   GFR calc non Af Amer 100 >59 mL/min/1.73   GFR calc Af Amer 116 >59 mL/min/1.73   BUN/Creatinine Ratio  23 (H) 9 - 20   Sodium 136 134 - 144 mmol/L   Potassium 4.3 3.5 - 5.2 mmol/L   Chloride 96 96 - 106 mmol/L   CO2 25 20 - 29 mmol/L   Calcium 9.2 8.7 - 10.2 mg/dL      Assessment & Plan:   Problem List Items Addressed This Visit      Endocrine   Type 2 diabetes mellitus with hyperglycemia (HCC) - Primary    A1C improved to 8.9 with just dietary modifications. Pt was unable to start recommended medications d/t cost. Will increase metformin to 1000 mg BID and add glipizide. Continue working on lifestyle modifications.       Relevant Medications   glipiZIDE (GLUCOTROL) 5 MG tablet   metFORMIN (GLUCOPHAGE) 1000 MG tablet   Other Relevant Orders   Bayer DCA Hb A1c Waived (Completed)   Basic metabolic panel (Completed)    Other Visit Diagnoses    Skin ulcer of left foot, limited to breakdown of skin (HCC)       Significant improvement from 6 months ago when last seen, with no sign of active infection. Will refill ointment, continue wound care as before       Follow up plan: Return in about 3 months (around 05/05/2018) for CPE.

## 2018-02-06 LAB — BASIC METABOLIC PANEL
BUN/Creatinine Ratio: 23 — ABNORMAL HIGH (ref 9–20)
BUN: 19 mg/dL (ref 6–24)
CALCIUM: 9.2 mg/dL (ref 8.7–10.2)
CO2: 25 mmol/L (ref 20–29)
CREATININE: 0.82 mg/dL (ref 0.76–1.27)
Chloride: 96 mmol/L (ref 96–106)
GFR calc Af Amer: 116 mL/min/{1.73_m2} (ref >59)
GFR, EST NON AFRICAN AMERICAN: 100 mL/min/{1.73_m2} (ref >59)
Glucose: 295 mg/dL — ABNORMAL HIGH (ref 65–99)
Potassium: 4.3 mmol/L (ref 3.5–5.2)
Sodium: 136 mmol/L (ref 134–144)

## 2018-02-08 NOTE — Assessment & Plan Note (Signed)
A1C improved to 8.9 with just dietary modifications. Pt was unable to start recommended medications d/t cost. Will increase metformin to 1000 mg BID and add glipizide. Continue working on lifestyle modifications.

## 2018-04-01 ENCOUNTER — Other Ambulatory Visit: Payer: Self-pay | Admitting: Family Medicine

## 2018-05-07 ENCOUNTER — Ambulatory Visit: Payer: BLUE CROSS/BLUE SHIELD | Admitting: Family Medicine

## 2018-07-16 ENCOUNTER — Ambulatory Visit: Payer: BLUE CROSS/BLUE SHIELD | Admitting: Family Medicine

## 2018-08-13 ENCOUNTER — Ambulatory Visit: Payer: BLUE CROSS/BLUE SHIELD | Admitting: Family Medicine

## 2018-09-04 ENCOUNTER — Ambulatory Visit (INDEPENDENT_AMBULATORY_CARE_PROVIDER_SITE_OTHER): Payer: BLUE CROSS/BLUE SHIELD | Admitting: Family Medicine

## 2018-09-04 ENCOUNTER — Other Ambulatory Visit: Payer: Self-pay

## 2018-09-04 ENCOUNTER — Encounter: Payer: Self-pay | Admitting: Family Medicine

## 2018-09-04 VITALS — BP 137/85 | HR 94 | Temp 98.1°F | Ht 69.5 in | Wt 252.0 lb

## 2018-09-04 DIAGNOSIS — E1165 Type 2 diabetes mellitus with hyperglycemia: Secondary | ICD-10-CM | POA: Diagnosis not present

## 2018-09-04 MED ORDER — GLIPIZIDE 5 MG PO TABS: 5.0000 mg | ORAL_TABLET | Freq: Two times a day (BID) | ORAL | 1 refills | Status: DC

## 2018-09-04 MED ORDER — METFORMIN HCL 1000 MG PO TABS: 1000.0000 mg | ORAL_TABLET | Freq: Two times a day (BID) | ORAL | 1 refills | Status: DC

## 2018-09-04 NOTE — Progress Notes (Signed)
BP 137/85   Pulse 94   Temp 98.1 F (36.7 C) (Oral)   Ht 5' 9.5" (1.765 m)   Wt 252 lb (114.3 kg)   SpO2 96%   BMI 36.68 kg/m    Subjective:    Patient ID: [name], male    DOB: 06/01/1961, 57 y.o.   MRN: [id number]  HPI: [name] is a 57 y.o. male  Chief Complaint  Patient presents with  . Diabetes    f/u   Here today for DM f/u. Does not check home sugars. Last A1C was 8.9 back in Feb. Taking medications about 2-3 times a week. No side effects, just forgets. Tries to limit sugar intake for the most part. Has almost completely cut out soft drinks the past few months. Denies low blood sugar spells.   Relevant past medical, surgical, family and social history reviewed and updated as indicated. Interim medical history since our last visit reviewed. Allergies and medications reviewed and updated.  Review of Systems  Per HPI unless specifically indicated above     Objective:    BP 137/85   Pulse 94   Temp 98.1 F (36.7 C) (Oral)   Ht 5' 9.5" (1.765 m)   Wt 252 lb (114.3 kg)   SpO2 96%   BMI 36.68 kg/m   Wt Readings from Last 3 Encounters:  09/04/18 252 lb (114.3 kg)  02/05/18 256 lb 1.6 oz (116.2 kg)  08/14/17 236 lb (107 kg)    Physical Exam  Constitutional: He is oriented to person, place, and time. He appears well-developed and well-nourished.  HENT:  Head: Atraumatic.  Eyes: Conjunctivae and EOM are normal.  Neck: Normal range of motion. Neck supple.  Cardiovascular: Normal rate and regular rhythm.  Pulmonary/Chest: Effort normal and breath sounds normal.  Musculoskeletal: Normal range of motion.  Neurological: He is alert and oriented to person, place, and time.  Skin: Skin is warm and dry.  Psychiatric: He has a normal mood and affect. His behavior is normal.  Nursing note and vitals reviewed.   Results for orders placed or performed in visit on 02/05/18  Bayer DCA Hb A1c Waived  Result Value Ref Range   HB A1C (BAYER DCA - WAIVED) 8.9  (H) <7.0 %  Basic metabolic panel  Result Value Ref Range   Glucose 295 (H) 65 - 99 mg/dL   BUN 19 6 - 24 mg/dL   Creatinine, Ser 4.78 0.76 - 1.27 mg/dL   GFR calc non Af Amer 100 >59 mL/min/1.73   GFR calc Af Amer 116 >59 mL/min/1.73   BUN/Creatinine Ratio 23 (H) 9 - 20   Sodium 136 134 - 144 mmol/L   Potassium 4.3 3.5 - 5.2 mmol/L   Chloride 96 96 - 106 mmol/L   CO2 25 20 - 29 mmol/L   Calcium 9.2 8.7 - 10.2 mg/dL      Assessment & Plan:   Problem List Items Addressed This Visit      Endocrine   Type 2 diabetes mellitus with hyperglycemia (HCC) - Primary    Recheck A1C today. Long discussion with patient about medication and diet compliance. Weight up nearly 20 lb from last year. Continue current regimen - cannot judge if additions need to be made at this time as he's rarely taking the two medications he's got      Relevant Medications   metFORMIN (GLUCOPHAGE) 1000 MG tablet   glipiZIDE (GLUCOTROL) 5 MG tablet   Other Relevant Orders   HgB  A1c   Microalbumin, Urine Waived   Basic Metabolic Panel (BMET)       Follow up plan: Return in about 3 months (around 12/04/2018) for CPE.

## 2018-09-04 NOTE — Assessment & Plan Note (Signed)
Recheck A1C today. Long discussion with patient about medication and diet compliance. Weight up nearly 20 lb from last year. Continue current regimen - cannot judge if additions need to be made at this time as he's rarely taking the two medications he's got

## 2018-09-04 NOTE — Patient Instructions (Signed)
Follow up for CPE 

## 2018-09-05 LAB — MICROALBUMIN, URINE WAIVED
CREATININE, URINE WAIVED: 100 mg/dL (ref 10–300)
Microalb, Ur Waived: 150 mg/L — ABNORMAL HIGH (ref 0–19)

## 2018-09-05 LAB — SPECIMEN STATUS

## 2018-09-11 LAB — BASIC METABOLIC PANEL
BUN / CREAT RATIO: 17 (ref 9–20)
BUN: 16 mg/dL (ref 6–24)
CHLORIDE: 100 mmol/L (ref 96–106)
CO2: 22 mmol/L (ref 20–29)
Calcium: 9.2 mg/dL (ref 8.7–10.2)
Creatinine, Ser: 0.96 mg/dL (ref 0.76–1.27)
GFR calc non Af Amer: 89 mL/min/{1.73_m2} (ref >59)
GFR, EST AFRICAN AMERICAN: 102 mL/min/{1.73_m2} (ref >59)
Glucose: 358 mg/dL — ABNORMAL HIGH (ref 65–99)
Potassium: 4.5 mmol/L (ref 3.5–5.2)
Sodium: 137 mmol/L (ref 134–144)

## 2018-10-03 LAB — SPECIMEN STATUS REPORT

## 2018-10-03 LAB — HGB A1C W/O EAG: HEMOGLOBIN A1C: 11 % — AB (ref 4.8–5.6)

## 2018-12-24 ENCOUNTER — Ambulatory Visit (INDEPENDENT_AMBULATORY_CARE_PROVIDER_SITE_OTHER): Payer: BLUE CROSS/BLUE SHIELD | Admitting: Family Medicine

## 2018-12-24 ENCOUNTER — Encounter: Payer: Self-pay | Admitting: Family Medicine

## 2018-12-24 VITALS — BP 133/79 | HR 89 | Temp 97.6°F | Ht 70.0 in | Wt 249.0 lb

## 2018-12-24 DIAGNOSIS — E1165 Type 2 diabetes mellitus with hyperglycemia: Secondary | ICD-10-CM | POA: Diagnosis not present

## 2018-12-24 DIAGNOSIS — L97529 Non-pressure chronic ulcer of other part of left foot with unspecified severity: Secondary | ICD-10-CM | POA: Diagnosis not present

## 2018-12-24 DIAGNOSIS — Z6835 Body mass index (BMI) 35.0-35.9, adult: Secondary | ICD-10-CM | POA: Diagnosis not present

## 2018-12-24 DIAGNOSIS — E669 Obesity, unspecified: Secondary | ICD-10-CM | POA: Insufficient documentation

## 2018-12-24 DIAGNOSIS — N529 Male erectile dysfunction, unspecified: Secondary | ICD-10-CM | POA: Diagnosis not present

## 2018-12-24 DIAGNOSIS — Z0000 Encounter for general adult medical examination without abnormal findings: Secondary | ICD-10-CM | POA: Diagnosis not present

## 2018-12-24 DIAGNOSIS — Z1211 Encounter for screening for malignant neoplasm of colon: Secondary | ICD-10-CM

## 2018-12-24 LAB — UA/M W/RFLX CULTURE, ROUTINE
Bilirubin, UA: NEGATIVE
Ketones, UA: NEGATIVE
Leukocytes, UA: NEGATIVE
Nitrite, UA: NEGATIVE
Specific Gravity, UA: >1.03 — ABNORMAL HIGH (ref 1.005–1.030)
UUROB: 0.2 mg/dL (ref 0.2–1.0)
pH, UA: 5 (ref 5.0–7.5)

## 2018-12-24 LAB — MICROSCOPIC EXAMINATION: RBC, UA: NONE SEEN /hpf (ref 0–2)

## 2018-12-24 MED ORDER — MUPIROCIN 2 % EX OINT: 1.0000 "application " | TOPICAL_OINTMENT | Freq: Two times a day (BID) | CUTANEOUS | 1 refills | Status: DC

## 2018-12-24 MED ORDER — SILDENAFIL CITRATE 100 MG PO TABS: 100.0000 mg | ORAL_TABLET | Freq: Every day | ORAL | 6 refills | Status: DC | PRN

## 2018-12-24 NOTE — Progress Notes (Signed)
BP 133/79   Pulse 89   Temp 97.6 F (36.4 C) (Oral)   Ht 5\' 10"  (1.778 m)   Wt 249 lb (112.9 kg)   SpO2 96%   BMI 35.73 kg/m    Subjective:    Patient ID: [name], male    DOB: [birth year], 58 y.o.   MRN: [id number]  HPI: Corleone Biegler is a 58 y.o. male presenting on 12/24/2018 for comprehensive medical examination. Current medical complaints include:see below  DM - historically non-compliant with medications, has been on glipizide and metformin for quite some time with significantly uncontrolled A1C. Taking his medicines about 3-4 times weekly on average. No low blood sugar spells or side effects. Pt does not want to continue adding on medicines, esp anything injectable - refuses these. Does not watch diet or exercise.   4-5 years of erectile issues. Has never been on anything for this in the past. Very bothersome for him.   He currently lives with: Interim Problems from his last visit: yes  Depression Screen done today and results listed below:  Depression screen [facility] 2/9 12/24/2018 08/14/2017 07/23/2017  Decreased Interest 0 0 0  Down, Depressed, Hopeless 0 0 0  PHQ - 2 Score 0 0 0  Altered sleeping 0 - -  Tired, decreased energy 0 - -  Change in appetite 0 - -  Feeling bad or failure about yourself  0 - -  Trouble concentrating 0 - -  Moving slowly or fidgety/restless 0 - -  Suicidal thoughts 0 - -  PHQ-9 Score 0 - -     The patient does not have a history of falls. I did not complete a risk assessment for falls. A plan of care for falls was not documented.   Past Medical History:  Past Medical History:  Diagnosis Date  . Diabetes mellitus without complication [facility])     Surgical History:  Past Surgical History:  Procedure Laterality Date  . ELBOW FRACTURE SURGERY Right   . EXTERNAL EAR SURGERY      Medications:  Current Outpatient Medications on File Prior to Visit  Medication Sig  . glipiZIDE (GLUCOTROL) 5 MG tablet Take 1 tablet (5 mg total) by mouth  2 (two) times daily before a meal.  . metFORMIN (GLUCOPHAGE) 1000 MG tablet Take 1 tablet (1,000 mg total) by mouth 2 (two) times daily with a meal.   No current facility-administered medications on file prior to visit.     Allergies:  Allergies  Allergen Reactions  . Amoxicillin Rash    Social History:  Social History   Socioeconomic History  . Marital status: Single    Spouse name: Not on file  . Number of children: Not on file  . Years of education: Not on file  . Highest education level: Not on file  Occupational History  . Not on file  Social Needs  . Financial resource strain: Not on file  . Food insecurity:    Worry: Not on file    Inability: Not on file  . Transportation needs:    Medical: Not on file    Non-medical: Not on file  Tobacco Use  . Smoking status: Never Smoker  . Smokeless tobacco: Never Used  Substance and Sexual Activity  . Alcohol use: Yes    Comment: occasionally  . Drug use: No  . Sexual activity: Not on file  Lifestyle  . Physical activity:    Days per week: Not on file    Minutes per  session: Not on file  . Stress: Not on file  Relationships  . Social connections:    Talks on phone: Not on file    Gets together: Not on file    Attends religious service: Not on file    Active member of club or organization: Not on file    Attends meetings of clubs or organizations: Not on file    Relationship status: Not on file  . Intimate partner violence:    Fear of current or ex partner: Not on file    Emotionally abused: Not on file    Physically abused: Not on file    Forced sexual activity: Not on file  Other Topics Concern  . Not on file  Social History Narrative  . Not on file   Social History   Tobacco Use  Smoking Status Never Smoker  Smokeless Tobacco Never Used   Social History   Substance and Sexual Activity  Alcohol Use Yes   Comment: occasionally    Family History:  Family History  Problem Relation Age of Onset  .  Dementia Mother   . Hypertension Mother   . Diabetes Maternal Grandmother   . Hypertension Maternal Grandmother   . Cancer Neg Hx   . COPD Neg Hx   . Heart disease Neg Hx   . Stroke Neg Hx     Past medical history, surgical history, medications, allergies, family history and social history reviewed with patient today and changes made to appropriate areas of the chart.   Review of Systems - General ROS: negative Psychological ROS: negative Ophthalmic ROS: negative ENT ROS: negative Allergy and Immunology ROS: negative Hematological and Lymphatic ROS: negative Endocrine ROS: negative Respiratory ROS: no cough, shortness of breath, or wheezing Cardiovascular ROS: no chest pain or dyspnea on exertion Gastrointestinal ROS: no abdominal pain, change in bowel habits, or black or bloody stools Genito-Urinary ROS: erectile dysfunction Musculoskeletal ROS: negative Neurological ROS: no TIA or stroke symptoms Dermatological ROS: negative All other ROS negative except what is listed above and in the HPI.      Objective:    BP 133/79   Pulse 89   Temp 97.6 F (36.4 C) (Oral)   Ht 5\' 10"  (1.778 m)   Wt 249 lb (112.9 kg)   SpO2 96%   BMI 35.73 kg/m   Wt Readings from Last 3 Encounters:  12/24/18 249 lb (112.9 kg)  09/04/18 252 lb (114.3 kg)  02/05/18 256 lb 1.6 oz (116.2 kg)    Physical Exam Vitals signs and nursing note reviewed.  Constitutional:      General: He is not in acute distress.    Appearance: He is well-developed.  HENT:     Head: Atraumatic.     Right Ear: External ear normal.     Left Ear: External ear normal.     Nose: Nose normal.  Eyes:     General: No scleral icterus.    Conjunctiva/sclera: Conjunctivae normal.     Pupils: Pupils are equal, round, and reactive to light.  Neck:     Musculoskeletal: Normal range of motion and neck supple.  Cardiovascular:     Rate and Rhythm: Normal rate and regular rhythm.     Heart sounds: Normal heart sounds. No  murmur.  Pulmonary:     Effort: Pulmonary effort is normal. No respiratory distress.     Breath sounds: Normal breath sounds.  Abdominal:     General: Bowel sounds are normal. There is no distension.  Palpations: Abdomen is soft. There is no mass.     Tenderness: There is no abdominal tenderness. There is no guarding.  Genitourinary:    Comments: Exam declined Musculoskeletal: Normal range of motion.        General: No tenderness.  Skin:    General: Skin is warm and dry.     Findings: No rash.  Neurological:     Mental Status: He is alert and oriented to person, place, and time.     Deep Tendon Reflexes: Reflexes are normal and symmetric.  Psychiatric:        Behavior: Behavior normal.    Diabetic Foot Exam - Simple   Simple Foot Form Diabetic Foot exam was performed with the following findings:  Yes 12/24/2018 10:02 PM  Visual Inspection See comments:  Yes Sensation Testing Intact to touch and monofilament testing bilaterally:  Yes Pulse Check Posterior Tibialis and Dorsalis pulse intact bilaterally:  Yes Comments Left great toe ulcer, non infected    Results for orders placed or performed in visit on 12/24/18  Microscopic Examination  Result Value Ref Range   WBC, UA 0-5 0 - 5 /hpf   RBC, UA None seen 0 - 2 /hpf   Epithelial Cells (non renal) 0-10 0 - 10 /hpf   Bacteria, UA Few (A) None seen/Few  CBC with Differential/Platelet  Result Value Ref Range   WBC 6.9 3.4 - 10.8 x10E3/uL   RBC 4.27 4.14 - 5.80 x10E6/uL   Hemoglobin 13.6 13.0 - 17.7 g/dL   Hematocrit 69.7 94.8 - 51.0 %   MCV 91 79 - 97 fL   MCH 31.9 26.6 - 33.0 pg   MCHC 34.9 31.5 - 35.7 g/dL   RDW 01.6 55.3 - 74.8 %   Platelets 205 150 - 450 x10E3/uL   Neutrophils 62 Not Estab. %   Lymphs 27 Not Estab. %   Monocytes 7 Not Estab. %   Eos 3 Not Estab. %   Basos 0 Not Estab. %   Neutrophils Absolute 4.3 1.4 - 7.0 x10E3/uL   Lymphocytes Absolute 1.9 0.7 - 3.1 x10E3/uL   Monocytes Absolute 0.5 0.1 -  0.9 x10E3/uL   EOS (ABSOLUTE) 0.2 0.0 - 0.4 x10E3/uL   Basophils Absolute 0.0 0.0 - 0.2 x10E3/uL   Immature Granulocytes 1 Not Estab. %   Immature Grans (Abs) 0.1 0.0 - 0.1 x10E3/uL  Comprehensive metabolic panel  Result Value Ref Range   Glucose 215 (H) 65 - 99 mg/dL   BUN 13 6 - 24 mg/dL   Creatinine, Ser 2.70 0.76 - 1.27 mg/dL   GFR calc non Af Amer 101 >59 mL/min/1.73   GFR calc Af Amer 116 >59 mL/min/1.73   BUN/Creatinine Ratio 16 9 - 20   Sodium 135 134 - 144 mmol/L   Potassium 4.1 3.5 - 5.2 mmol/L   Chloride 98 96 - 106 mmol/L   CO2 23 20 - 29 mmol/L   Calcium 9.4 8.7 - 10.2 mg/dL   Total Protein 6.6 6.0 - 8.5 g/dL   Albumin 4.3 3.5 - 5.5 g/dL   Globulin, Total 2.3 1.5 - 4.5 g/dL   Albumin/Globulin Ratio 1.9 1.2 - 2.2   Bilirubin Total 0.5 0.0 - 1.2 mg/dL   Alkaline Phosphatase 54 39 - 117 IU/L   AST 24 0 - 40 IU/L   ALT 29 0 - 44 IU/L  Lipid Panel w/o Chol/HDL Ratio  Result Value Ref Range   Cholesterol, Total 192 100 - 199 mg/dL   Triglycerides  363 (H) 0 - 149 mg/dL   HDL 31 (L) >74 mg/dL   VLDL Cholesterol Cal 73 (H) 5 - 40 mg/dL   LDL Calculated 88 0 - 99 mg/dL  UA/M w/rflx Culture, Routine  Result Value Ref Range   Specific Gravity, UA >1.030 (H) 1.005 - 1.030   pH, UA 5.0 5.0 - 7.5   Color, UA Yellow Yellow   Appearance Ur Clear Clear   Leukocytes, UA Negative Negative   Protein, UA 2+ (A) Negative/Trace   Glucose, UA 2+ (A) Negative   Ketones, UA Negative Negative   RBC, UA Trace (A) Negative   Bilirubin, UA Negative Negative   Urobilinogen, Ur 0.2 0.2 - 1.0 mg/dL   Nitrite, UA Negative Negative   Microscopic Examination See below:   HgB A1c  Result Value Ref Range   Hgb A1c MFr Bld 11.2 (H) 4.8 - 5.6 %   Est. average glucose Bld gHb Est-mCnc 275 mg/dL      Assessment & Plan:   Problem List Items Addressed This Visit      Endocrine   Type 2 diabetes mellitus with hyperglycemia (HCC) - Primary    Recheck A1C, add on as needed. Discussed  importance of medication compliance as well as lifestyle modifications. Recommended adding a weekly dosed GLP-1 to help with this but pt refusing any injectable therapy.       Relevant Orders   Comprehensive metabolic panel (Completed)   UA/M w/rflx Culture, Routine (Completed)   HgB A1c (Completed)     Other   Obesity    Lifestyle modifications reviewed      Relevant Orders   Lipid Panel w/o Chol/HDL Ratio (Completed)   ED (erectile dysfunction)    Trial viagra. Risks and benefits reviewed       Other Visit Diagnoses    Annual physical exam       Relevant Orders   CBC with Differential/Platelet (Completed)   Colon cancer screening       Relevant Orders   Cologuard   Chronic ulcer of great toe of left foot, unspecified ulcer stage (HCC)       Non-infected, but still not healing after 1-2 years. Return to podiatry for management       Discussed aspirin prophylaxis for myocardial infarction prevention and decision  LABORATORY TESTING:  Health maintenance labs ordered today as discussed above.   The natural history of prostate cancer and ongoing controversy regarding screening and potential treatment outcomes of prostate cancer has been discussed with the patient. The meaning of a false positive PSA and a false negative PSA has been discussed. He indicates understanding of the limitations of this screening test and wishes not to proceed with screening PSA testing.   IMMUNIZATIONS:   - Tdap: Tetanus vaccination status reviewed: last tetanus booster within 10 years. - Influenza: Refused - Pneumovax: Up to date - Prevnar: Not applicable - HPV: Not applicable - Zostavax vaccine: Not applicable  SCREENING: - Colonoscopy: Refused  Discussed with patient purpose of the colonoscopy is to detect colon cancer at curable precancerous or early stages    PATIENT COUNSELING:    Sexuality: Discussed sexually transmitted diseases, partner selection, use of condoms, avoidance of  unintended pregnancy  and contraceptive alternatives.   Advised to avoid cigarette smoking.  I discussed with the patient that most people either abstain from alcohol or drink within safe limits (<=14/week and <=4 drinks/occasion for males, <=7/weeks and <= 3 drinks/occasion for females) and that the risk for alcohol  disorders and other health effects rises proportionally with the number of drinks per week and how often a drinker exceeds daily limits.  Discussed cessation/primary prevention of drug use and availability of treatment for abuse.   Diet: Encouraged to adjust caloric intake to maintain  or achieve ideal body weight, to reduce intake of dietary saturated fat and total fat, to limit sodium intake by avoiding high sodium foods and not adding table salt, and to maintain adequate dietary potassium and calcium preferably from fresh fruits, vegetables, and low-fat dairy products.    stressed the importance of regular exercise  Injury prevention: Discussed safety belts, safety helmets, smoke detector, smoking near bedding or upholstery.   Dental health: Discussed importance of regular tooth brushing, flossing, and dental visits.   Follow up plan: NEXT PREVENTATIVE PHYSICAL DUE IN 1 YEAR. Return in about 3 months (around 03/25/2019) for DM.

## 2018-12-24 NOTE — Progress Notes (Signed)
Cologuard ordered via online portal.  

## 2018-12-25 LAB — CBC WITH DIFFERENTIAL/PLATELET
BASOS ABS: 0 10*3/uL (ref 0.0–0.2)
Basos: 0 %
EOS (ABSOLUTE): 0.2 10*3/uL (ref 0.0–0.4)
Eos: 3 %
Hematocrit: 39 % (ref 37.5–51.0)
Hemoglobin: 13.6 g/dL (ref 13.0–17.7)
IMMATURE GRANS (ABS): 0.1 10*3/uL (ref 0.0–0.1)
IMMATURE GRANULOCYTES: 1 %
LYMPHS: 27 %
Lymphocytes Absolute: 1.9 10*3/uL (ref 0.7–3.1)
MCH: 31.9 pg (ref 26.6–33.0)
MCHC: 34.9 g/dL (ref 31.5–35.7)
MCV: 91 fL (ref 79–97)
Monocytes Absolute: 0.5 10*3/uL (ref 0.1–0.9)
Monocytes: 7 %
NEUTROS PCT: 62 %
Neutrophils Absolute: 4.3 10*3/uL (ref 1.4–7.0)
PLATELETS: 205 10*3/uL (ref 150–450)
RBC: 4.27 x10E6/uL (ref 4.14–5.80)
RDW: 12.3 % (ref 11.6–15.4)
WBC: 6.9 10*3/uL (ref 3.4–10.8)

## 2018-12-25 LAB — COMPREHENSIVE METABOLIC PANEL
ALT: 29 IU/L (ref 0–44)
AST: 24 IU/L (ref 0–40)
Albumin/Globulin Ratio: 1.9 (ref 1.2–2.2)
Albumin: 4.3 g/dL (ref 3.5–5.5)
Alkaline Phosphatase: 54 IU/L (ref 39–117)
BILIRUBIN TOTAL: 0.5 mg/dL (ref 0.0–1.2)
BUN/Creatinine Ratio: 16 (ref 9–20)
BUN: 13 mg/dL (ref 6–24)
CALCIUM: 9.4 mg/dL (ref 8.7–10.2)
CO2: 23 mmol/L (ref 20–29)
CREATININE: 0.8 mg/dL (ref 0.76–1.27)
Chloride: 98 mmol/L (ref 96–106)
GFR calc Af Amer: 116 mL/min/{1.73_m2} (ref >59)
GFR calc non Af Amer: 101 mL/min/{1.73_m2} (ref >59)
GLUCOSE: 215 mg/dL — AB (ref 65–99)
Globulin, Total: 2.3 g/dL (ref 1.5–4.5)
Potassium: 4.1 mmol/L (ref 3.5–5.2)
Sodium: 135 mmol/L (ref 134–144)
TOTAL PROTEIN: 6.6 g/dL (ref 6.0–8.5)

## 2018-12-25 LAB — HEMOGLOBIN A1C
Est. average glucose Bld gHb Est-mCnc: 275 mg/dL
Hgb A1c MFr Bld: 11.2 % — ABNORMAL HIGH (ref 4.8–5.6)

## 2018-12-25 LAB — LIPID PANEL W/O CHOL/HDL RATIO
Cholesterol, Total: 192 mg/dL (ref 100–199)
HDL: 31 mg/dL — ABNORMAL LOW (ref >39)
LDL Calculated: 88 mg/dL (ref 0–99)
Triglycerides: 363 mg/dL — ABNORMAL HIGH (ref 0–149)
VLDL CHOLESTEROL CAL: 73 mg/dL — AB (ref 5–40)

## 2018-12-26 ENCOUNTER — Other Ambulatory Visit: Payer: Self-pay | Admitting: Family Medicine

## 2018-12-26 MED ORDER — DAPAGLIFLOZIN PROPANEDIOL 5 MG PO TABS: 5.0000 mg | ORAL_TABLET | Freq: Every day | ORAL | 2 refills | Status: DC

## 2018-12-28 DIAGNOSIS — N529 Male erectile dysfunction, unspecified: Secondary | ICD-10-CM | POA: Insufficient documentation

## 2018-12-28 NOTE — Assessment & Plan Note (Signed)
Recheck A1C, add on as needed. Discussed importance of medication compliance as well as lifestyle modifications. Recommended adding a weekly dosed GLP-1 to help with this but pt refusing any injectable therapy.

## 2018-12-28 NOTE — Assessment & Plan Note (Signed)
Lifestyle modifications reviewed 

## 2018-12-28 NOTE — Assessment & Plan Note (Signed)
Trial viagra. Risks and benefits reviewed

## 2018-12-31 ENCOUNTER — Other Ambulatory Visit: Payer: Self-pay | Admitting: Family Medicine

## 2018-12-31 MED ORDER — EMPAGLIFLOZIN 10 MG PO TABS: 10.0000 mg | ORAL_TABLET | Freq: Every day | ORAL | 2 refills | Status: DC

## 2019-01-02 ENCOUNTER — Telehealth: Payer: Self-pay

## 2019-01-02 NOTE — Telephone Encounter (Signed)
Approved PA Case [id number] Status: Approved.  PA Case ID: [id number]

## 2019-01-02 NOTE — Telephone Encounter (Signed)
Prior authorization for Jardiance 10 mg was initiated via [email or web address]. Key: Angela Burke

## 2019-02-27 ENCOUNTER — Telehealth: Payer: Self-pay

## 2019-02-27 NOTE — Telephone Encounter (Signed)
Called patient to remind him to complete his cologuard and send it back to  [employer]. Pt states he will do it.

## 2019-03-01 DIAGNOSIS — Z1211 Encounter for screening for malignant neoplasm of colon: Secondary | ICD-10-CM | POA: Diagnosis not present

## 2019-03-05 LAB — COLOGUARD: Cologuard: NEGATIVE

## 2019-03-27 ENCOUNTER — Ambulatory Visit: Payer: BLUE CROSS/BLUE SHIELD | Admitting: Family Medicine

## 2019-04-24 ENCOUNTER — Ambulatory Visit: Payer: BLUE CROSS/BLUE SHIELD | Admitting: Family Medicine

## 2019-06-04 ENCOUNTER — Encounter: Payer: Self-pay | Admitting: Family Medicine

## 2019-06-04 ENCOUNTER — Ambulatory Visit (INDEPENDENT_AMBULATORY_CARE_PROVIDER_SITE_OTHER): Payer: BC Managed Care – PPO | Admitting: Family Medicine

## 2019-06-04 ENCOUNTER — Other Ambulatory Visit: Payer: Self-pay

## 2019-06-04 VITALS — BP 133/82 | HR 93 | Temp 98.6°F

## 2019-06-04 DIAGNOSIS — N529 Male erectile dysfunction, unspecified: Secondary | ICD-10-CM | POA: Diagnosis not present

## 2019-06-04 DIAGNOSIS — E1165 Type 2 diabetes mellitus with hyperglycemia: Secondary | ICD-10-CM

## 2019-06-04 LAB — BAYER DCA HB A1C WAIVED: HB A1C (BAYER DCA - WAIVED): 10.5 % — ABNORMAL HIGH (ref <7.0)

## 2019-06-04 MED ORDER — BASAGLAR KWIKPEN 100 UNIT/ML ~~LOC~~ SOPN: 10.0000 [IU] | PEN_INJECTOR | Freq: Every day | SUBCUTANEOUS | 2 refills | Status: DC

## 2019-06-04 NOTE — Progress Notes (Signed)
BP 133/82 (BP Location: Left Arm, Patient Position: Sitting, Cuff Size: Normal)   Pulse 93   Temp 98.6 F (37 C) (Oral)   SpO2 96%    Subjective:    Patient ID: [name], male    DOB: 1961/02/06, 58 y.o.   MRN: [id number]  HPI: [name] is a 58 y.o. male  Chief Complaint  Patient presents with  . Diabetes    Follow-up  . Back Pain    Patient states having back pain.   . Erectile Dysfunction    Patient states Viagra was ineffective.   Here today for 3 month f/u DM. Has been cutting out most sugars and starchy foods, minimal soft drinks. Does not check home BSs. Currently on metformin and glipizide. States jardiance was too expensive so he never started it. Denies low blood sugar spells or side effects on current medications. Very resistant to any type of injectable medications, has always refused them in the past. Does not exercise.   Viagra did not help with his ED issues. Does not want to try other medications for this at this time.   Relevant past medical, surgical, family and social history reviewed and updated as indicated. Interim medical history since our last visit reviewed. Allergies and medications reviewed and updated.  Review of Systems  Per HPI unless specifically indicated above     Objective:    BP 133/82 (BP Location: Left Arm, Patient Position: Sitting, Cuff Size: Normal)   Pulse 93   Temp 98.6 F (37 C) (Oral)   SpO2 96%   Wt Readings from Last 3 Encounters:  12/24/18 249 lb (112.9 kg)  09/04/18 252 lb (114.3 kg)  02/05/18 256 lb 1.6 oz (116.2 kg)    Physical Exam Vitals signs and nursing note reviewed.  Constitutional:      Appearance: Normal appearance.  HENT:     Head: Atraumatic.  Eyes:     Extraocular Movements: Extraocular movements intact.     Conjunctiva/sclera: Conjunctivae normal.  Neck:     Musculoskeletal: Normal range of motion and neck supple.  Cardiovascular:     Rate and Rhythm: Normal rate and regular rhythm.   Pulmonary:     Effort: Pulmonary effort is normal.     Breath sounds: Normal breath sounds.  Musculoskeletal: Normal range of motion.  Skin:    General: Skin is warm and dry.  Neurological:     General: No focal deficit present.     Mental Status: He is oriented to person, place, and time.  Psychiatric:        Mood and Affect: Mood normal.        Thought Content: Thought content normal.        Judgment: Judgment normal.     Results for orders placed or performed in visit on 06/04/19  Comprehensive metabolic panel  Result Value Ref Range   Glucose 362 (H) 65 - 99 mg/dL   BUN 25 (H) 6 - 24 mg/dL   Creatinine, Ser 0.89 0.76 - 1.27 mg/dL   GFR calc non Af Amer 96 >59 mL/min/1.73   GFR calc Af Amer 110 >59 mL/min/1.73   BUN/Creatinine Ratio 28 (H) 9 - 20   Sodium 134 134 - 144 mmol/L   Potassium 4.7 3.5 - 5.2 mmol/L   Chloride 97 96 - 106 mmol/L   CO2 21 20 - 29 mmol/L   Calcium 9.5 8.7 - 10.2 mg/dL   Total Protein 6.9 6.0 - 8.5 g/dL   Albumin  4.7 3.8 - 4.9 g/dL   Globulin, Total 2.2 1.5 - 4.5 g/dL   Albumin/Globulin Ratio 2.1 1.2 - 2.2   Bilirubin Total 0.5 0.0 - 1.2 mg/dL   Alkaline Phosphatase 57 39 - 117 IU/L   AST 25 0 - 40 IU/L   ALT 31 0 - 44 IU/L  Bayer DCA Hb A1c Waived  Result Value Ref Range   HB A1C (BAYER DCA - WAIVED) 10.5 (H) <7.0 %  Lipid panel  Result Value Ref Range   Cholesterol, Total 225 (H) 100 - 199 mg/dL   Triglycerides 161777 (HH) 0 - 149 mg/dL   HDL 26 (L) >09>39 mg/dL   VLDL Cholesterol Cal Comment 5 - 40 mg/dL   LDL Calculated Comment 0 - 99 mg/dL   Chol/HDL Ratio 8.7 (H) 0.0 - 5.0 ratio  Testosterone, Free, Total, SHBG  Result Value Ref Range   Testosterone 196 (L) 264 - 916 ng/dL   Testosterone, Free 4.8 (L) 7.2 - 24.0 pg/mL   Sex Hormone Binding 29.4 19.3 - 76.4 nmol/L      Assessment & Plan:   Problem List Items Addressed This Visit      Endocrine   Type 2 diabetes mellitus with hyperglycemia (HCC) - Primary    Long discussion about  continued significantly elevated BSs and long term risks. Patient agreeable to consider using an injectable pen device. Will send basaglar and monitor closely for tolerance. Reviewed use and precautions. Continue oral medications. Will refer to CCM to help with medication coverage. Continue lifestyle modifications, add exercise.       Relevant Medications   Insulin Glargine (BASAGLAR KWIKPEN) 100 UNIT/ML SOPN   Other Relevant Orders   Comprehensive metabolic panel (Completed)   Bayer DCA Hb A1c Waived (Completed)   Lipid panel (Completed)   Referral to Chronic Care Management Services     Other   ED (erectile dysfunction)    Discussed importance of DM control to help with this, and will also check testosterone levels. Declines trying other medicines today      Relevant Orders   Testosterone, Free, Total, SHBG       Follow up plan: Return in about 4 weeks (around 07/02/2019) for DM.

## 2019-06-07 LAB — TESTOSTERONE, FREE, TOTAL, SHBG
Sex Hormone Binding: 29.4 nmol/L (ref 19.3–76.4)
Testosterone, Free: 4.8 pg/mL — ABNORMAL LOW (ref 7.2–24.0)
Testosterone: 196 ng/dL — ABNORMAL LOW (ref 264–916)

## 2019-06-07 LAB — COMPREHENSIVE METABOLIC PANEL
ALT: 31 IU/L (ref 0–44)
AST: 25 IU/L (ref 0–40)
Albumin/Globulin Ratio: 2.1 (ref 1.2–2.2)
Albumin: 4.7 g/dL (ref 3.8–4.9)
Alkaline Phosphatase: 57 IU/L (ref 39–117)
BUN/Creatinine Ratio: 28 — ABNORMAL HIGH (ref 9–20)
BUN: 25 mg/dL — ABNORMAL HIGH (ref 6–24)
Bilirubin Total: 0.5 mg/dL (ref 0.0–1.2)
CO2: 21 mmol/L (ref 20–29)
Calcium: 9.5 mg/dL (ref 8.7–10.2)
Chloride: 97 mmol/L (ref 96–106)
Creatinine, Ser: 0.89 mg/dL (ref 0.76–1.27)
GFR calc Af Amer: 110 mL/min/{1.73_m2} (ref >59)
GFR calc non Af Amer: 96 mL/min/{1.73_m2} (ref >59)
Globulin, Total: 2.2 g/dL (ref 1.5–4.5)
Glucose: 362 mg/dL — ABNORMAL HIGH (ref 65–99)
Potassium: 4.7 mmol/L (ref 3.5–5.2)
Sodium: 134 mmol/L (ref 134–144)
Total Protein: 6.9 g/dL (ref 6.0–8.5)

## 2019-06-07 LAB — LIPID PANEL
Chol/HDL Ratio: 8.7 ratio — ABNORMAL HIGH (ref 0.0–5.0)
Cholesterol, Total: 225 mg/dL — ABNORMAL HIGH (ref 100–199)
HDL: 26 mg/dL — ABNORMAL LOW (ref >39)
Triglycerides: 777 mg/dL (ref 0–149)

## 2019-06-08 ENCOUNTER — Telehealth: Payer: Self-pay | Admitting: Family Medicine

## 2019-06-08 ENCOUNTER — Other Ambulatory Visit: Payer: Self-pay | Admitting: Family Medicine

## 2019-06-08 MED ORDER — ATORVASTATIN CALCIUM 20 MG PO TABS: 20.0000 mg | ORAL_TABLET | Freq: Every day | ORAL | 0 refills | Status: DC

## 2019-06-08 MED ORDER — BLOOD GLUCOSE METER KIT: PACK | 0 refills | Status: DC

## 2019-06-08 MED ORDER — PEN NEEDLES 32G X 6 MM MISC: 1.0000 [IU] | Freq: Every day | 11 refills | Status: DC | PRN

## 2019-06-08 NOTE — Telephone Encounter (Signed)
Signed by Dr. Wynetta Emery in absence of Apolonio Schneiders and faxed to pharmacy

## 2019-06-08 NOTE — Telephone Encounter (Signed)
Called patient to discuss labs - significantly elevated TGs, discussed starting lipitor, making good diet and exercise changes. Also discussed low testosterone levels and their potential impact on energy, libido and erectile issues. Offered Urology referral, pt wishes to hold off for now. Recheck as scheduled

## 2019-06-08 NOTE — Assessment & Plan Note (Signed)
Discussed importance of DM control to help with this, and will also check testosterone levels. Declines trying other medicines today

## 2019-06-08 NOTE — Assessment & Plan Note (Signed)
Long discussion about continued significantly elevated BSs and long term risks. Patient agreeable to consider using an injectable pen device. Will send basaglar and monitor closely for tolerance. Reviewed use and precautions. Continue oral medications. Will refer to CCM to help with medication coverage. Continue lifestyle modifications, add exercise.

## 2019-06-08 NOTE — Telephone Encounter (Signed)
Apolonio Schneiders please sign off on Rx order.

## 2019-06-08 NOTE — Telephone Encounter (Signed)
Order signed.

## 2019-06-08 NOTE — Telephone Encounter (Signed)
Patient said he received a prescription for his insulin on 06/05/19 but he did not receive a prescription for the needles and the meter.  Please have that sent to his preferred pharmacy [facility] in [place].

## 2019-06-30 ENCOUNTER — Ambulatory Visit: Payer: Self-pay | Admitting: Pharmacist

## 2019-06-30 DIAGNOSIS — E1165 Type 2 diabetes mellitus with hyperglycemia: Secondary | ICD-10-CM

## 2019-06-30 NOTE — Patient Instructions (Signed)
Visit Information  Goals Addressed            This Visit's Progress     Patient Stated   . "I need to control my blood sugars" (pt-stated)       Current Barriers:  . Diabetes: uncontrolled; most recent A1c 10.6%. Related to medication nonadherence due to cost - he doesn't remember the name of the medication (likely Jardiance) but notes that the copay was >$400 so he never filled . At last office visit with Merrie Roof, PA, Basaglar initiated at 10 units daily. Also managed on metformin 1000 mg BID and glipizide 5 mg BID  . Current blood glucose readings: Reports fastings initially in 300s when starting Basaglar, but now generally 120-250s. Has been checking daily.  . Cardiovascular risk reduction: o Current hypertensive regimen: n/a o Current hyperlipidemia regimen: atorvastatin 20 mg daily initiated at last office visit; LDL unable to be calculated d/t TG >700.    Pharmacist Clinical Goal(s):  Marland Kitchen Over the next 90 days, patient with work with PharmD and primary care provider to address optimized hyperglycemic management  Interventions: . Comprehensive medication review performed . Discussed goal A1c <7% and goal fasting <130 to reduce risk of long term complications of diabetes. Patient verbalized understanding.  . Discussed availability of manufacturer coupon cards for brand diabetes medications for commercially insured patients; downloaded [place] coupon card from website. Will likely bring patient's copay to $5/month. Reiterated that these are available for all brand medications, if in the future it is decided to retry SGLT2 or start GLP1  Patient Self Care Activities:  . Patient will continue to check blood glucose daily document, and provide at future appointments . Patient will take medications as prescribed . Patient will report any questions or concerns to provider   Initial goal documentation        The patient verbalized understanding of instructions provided today and  declined a print copy of patient instruction materials.    Plan: - Will place physical copy of coupon card in patient's folder at front desk in case needed at his upcoming appointment  - Will outreach patient in 3-5 weeks for continued medication management support and diabetic education  Catie Darnelle Maffucci, PharmD Clinical Pharmacist [place] [phone]

## 2019-06-30 NOTE — Chronic Care Management (AMB) (Signed)
Chronic Care Management   Note  06/30/2019 Name: Daray Polgar MRN: [id number] DOB: 09/20/1961   Subjective:  [name] is a 58 y.o. year old male who is a primary care patient of Volney American, [place]. The CCM team was consulted for assistance with chronic disease management and care coordination needs.    Received referral for medication access assistance.   Review of patient status, including review of consultants reports, laboratory and other test data, was performed as part of comprehensive evaluation and provision of chronic care management services.   Objective:  Lab Results  Component Value Date   CREATININE 0.89 06/04/2019   CREATININE 0.80 12/24/2018   CREATININE 0.96 09/04/2018    Lab Results  Component Value Date   HGBA1C 10.5 (H) 06/04/2019       Component Value Date/Time   CHOL 225 (H) 06/04/2019 1645   TRIG 777 ([place]) 06/04/2019 1645   HDL 26 (L) 06/04/2019 1645   CHOLHDL 8.7 (H) 06/04/2019 1645   LDLCALC Comment 06/04/2019 1645    Clinical ASCVD: No  The 10-year ASCVD risk score Mikey Bussing DC Jr., et al., 2013) is: 23.1%   Values used to calculate the score:     Age: 35 years     Sex: Male     Is Non-Hispanic African American: No     Diabetic: Yes     Tobacco smoker: No     Systolic Blood Pressure: 132 mmHg     Is BP treated: No     HDL Cholesterol: 26 mg/dL     Total Cholesterol: 225 mg/dL    BP Readings from Last 3 Encounters:  06/04/19 133/82  12/24/18 133/79  09/04/18 137/85    Allergies  Allergen Reactions  . Amoxicillin Rash    Medications Reviewed Today    Reviewed by Volney American, PA-C (Physician Assistant Certified) on 44/01/02 at 1549  Med List Status: <None>  Medication Order Taking? Sig Documenting Provider Last Dose Status Informant       Patient not taking:      Discontinued 06/04/19 1523 (Cost of medication)   glipiZIDE (GLUCOTROL) 5 MG tablet [id number] Yes Take 1 tablet (5 mg total) by mouth 2 (two)  times daily before a meal. Volney American, PA-C Taking Active   metFORMIN (GLUCOPHAGE) 1000 MG tablet [id number] Yes Take 1 tablet (1,000 mg total) by mouth 2 (two) times daily with a meal. Volney American, PA-C Taking Active   mupirocin ointment (BACTROBAN) 2 % [id number] No Place 1 application into the nose 2 (two) times daily.  Patient not taking: Reported on 06/04/2019   Volney American, [place] Not Taking Active        Patient not taking:      Discontinued 06/04/19 1524 (Ineffective)            Assessment:   Goals Addressed            This Visit's Progress     Patient Stated   . "I need to control my blood sugars" (pt-stated)       Current Barriers:  . Diabetes: uncontrolled; most recent A1c 10.6%. Related to medication nonadherence due to cost - he doesn't remember the name of the medication (likely Jardiance) but notes that the copay was >$400 so he never filled . At last office visit with Merrie Roof, PA, Basaglar initiated at 10 units daily. Also managed on metformin 1000 mg BID and glipizide 5 mg BID  . Current blood glucose readings: Reports  fastings initially in 300s when starting Basaglar, but now generally 120-250s. Has been checking daily.  . Cardiovascular risk reduction: o Current hypertensive regimen: n/a o Current hyperlipidemia regimen: atorvastatin 20 mg daily initiated at last office visit; LDL unable to be calculated d/t TG >700.    Pharmacist Clinical Goal(s):  Marland Kitchen. Over the next 90 days, patient with work with PharmD and primary care provider to address optimized hyperglycemic management  Interventions: . Comprehensive medication review performed . Discussed goal A1c <7% and goal fasting <130 to reduce risk of long term complications of diabetes. Patient verbalized understanding.  . Discussed availability of manufacturer coupon cards for brand diabetes medications for commercially insured patients; downloaded Basaglar coupon card from  website. Will likely bring patient's copay to $5/month. Reiterated that these are available for all brand medications, if in the future it is decided to retry SGLT2 or start GLP1  Patient Self Care Activities:  . Patient will continue to check blood glucose daily document, and provide at future appointments . Patient will take medications as prescribed . Patient will report any questions or concerns to provider   Initial goal documentation        Plan: - Will place physical copy of coupon card in patient's folder at front desk in case needed at his upcoming appointment  - Will outreach patient in 3-5 weeks for continued medication management support and diabetic education  Catie Feliz Beamravis, PharmD Clinical Pharmacist [facility] [phone]

## 2019-07-02 ENCOUNTER — Encounter: Payer: Self-pay | Admitting: Family Medicine

## 2019-07-02 ENCOUNTER — Ambulatory Visit (INDEPENDENT_AMBULATORY_CARE_PROVIDER_SITE_OTHER): Payer: BC Managed Care – PPO | Admitting: Family Medicine

## 2019-07-02 ENCOUNTER — Other Ambulatory Visit: Payer: Self-pay

## 2019-07-02 VITALS — BP 105/70 | HR 90 | Temp 98.4°F | Ht 72.0 in | Wt 256.0 lb

## 2019-07-02 DIAGNOSIS — E1165 Type 2 diabetes mellitus with hyperglycemia: Secondary | ICD-10-CM

## 2019-07-02 NOTE — Progress Notes (Signed)
   BP 105/70 (BP Location: Right Arm, Patient Position: Sitting, Cuff Size: Normal)   Pulse 90   Temp 98.4 F (36.9 C) (Oral)   Ht 6' (1.829 m)   Wt 256 lb (116.1 kg)   SpO2 97%   BMI 34.72 kg/m    Subjective:    Patient ID: [name], male    DOB: 08/04/1961, 58 y.o.   MRN: [id number]  HPI: [name] is a 58 y.o. male  Chief Complaint  Patient presents with  . Diabetes    Follow-up since starting Basaglar Insulin. No complaints.   Patient presenting today for 1 month f/u after initiating basaglar for persistently uncontrolled DM. Home BSs - 120-220 since adding the medication. Currently on 10 units daily along with metformin and glipizide. No low blood sugar spells. Has been working on diet changes.   Relevant past medical, surgical, family and social history reviewed and updated as indicated. Interim medical history since our last visit reviewed. Allergies and medications reviewed and updated.  Review of Systems  Per HPI unless specifically indicated above     Objective:    BP 105/70 (BP Location: Right Arm, Patient Position: Sitting, Cuff Size: Normal)   Pulse 90   Temp 98.4 F (36.9 C) (Oral)   Ht 6' (1.829 m)   Wt 256 lb (116.1 kg)   SpO2 97%   BMI 34.72 kg/m   Wt Readings from Last 3 Encounters:  07/02/19 256 lb (116.1 kg)  12/24/18 249 lb (112.9 kg)  09/04/18 252 lb (114.3 kg)    Physical Exam Vitals signs and nursing note reviewed.  Constitutional:      Appearance: Normal appearance.  HENT:     Head: Atraumatic.  Eyes:     Extraocular Movements: Extraocular movements intact.     Conjunctiva/sclera: Conjunctivae normal.  Neck:     Musculoskeletal: Normal range of motion and neck supple.  Cardiovascular:     Rate and Rhythm: Normal rate and regular rhythm.  Pulmonary:     Effort: Pulmonary effort is normal.     Breath sounds: Normal breath sounds.  Musculoskeletal: Normal range of motion.  Skin:    General: Skin is warm and dry.   Neurological:     General: No focal deficit present.     Mental Status: He is oriented to person, place, and time.  Psychiatric:        Mood and Affect: Mood normal.        Thought Content: Thought content normal.        Judgment: Judgment normal.     Results for orders placed or performed in visit on 07/02/19  Cologuard  Result Value Ref Range   Cologuard Negative Negative      Assessment & Plan:   Problem List Items Addressed This Visit      Endocrine   Type 2 diabetes mellitus with hyperglycemia ([place]) - Primary    Titrate up to 12 units daily and monitor BSs, may go up to as high as 14 depending on readings. Cautioned about low blood sugar readings/sxs. Diet and exercise reviewed. Will recheck A1C in 2 months      Relevant Medications   aspirin EC 81 MG tablet       Follow up plan: Return in about 2 months (around 09/02/2019) for DM.

## 2019-07-06 NOTE — Assessment & Plan Note (Signed)
Titrate up to 12 units daily and monitor BSs, may go up to as high as 14 depending on readings. Cautioned about low blood sugar readings/sxs. Diet and exercise reviewed. Will recheck A1C in 2 months

## 2019-08-05 ENCOUNTER — Ambulatory Visit: Payer: Self-pay | Admitting: Pharmacist

## 2019-08-05 ENCOUNTER — Telehealth: Payer: Self-pay

## 2019-08-05 NOTE — Chronic Care Management (AMB) (Signed)
  Chronic Care Management   Note  08/05/2019 Name: [name] MRN: [id number] DOB: September 27, 1961  [name] is a 58 y.o. year old male who is a primary care patient of Volney American, [place]. The CCM team was consulted for assistance with chronic disease management and care coordination needs.     Contacted patient to follow up on blood sugar results s/p primary care provider appointment last month. Left HIPAA compliant message for him to return my call at his convenience.   Follow up plan: - If I do not hear back, will outreach again in 3-4 weeks  Catie Darnelle Maffucci, PharmD Clinical Pharmacist [place] [phone]

## 2019-08-12 ENCOUNTER — Ambulatory Visit: Payer: Self-pay | Admitting: Pharmacist

## 2019-08-12 NOTE — Chronic Care Management (AMB) (Signed)
  Chronic Care Management   Note  08/12/2019 Name: [name] MRN: [id number] DOB: 03-16-1961  [name] is a 58 y.o. year old male who is a primary care patient of Volney American, [place]. The CCM team was consulted for assistance with chronic disease management and care coordination needs.    Received call back from patient returning my call. Left HIPAA compliant message for him.   Follow up plan: - If I do not hear back form patient, will outreach later this month as scheduled.   Catie Darnelle Maffucci, PharmD Clinical Pharmacist [place] [phone]

## 2019-08-19 LAB — HM DIABETES EYE EXAM

## 2019-08-31 ENCOUNTER — Other Ambulatory Visit: Payer: Self-pay | Admitting: Family Medicine

## 2019-08-31 DIAGNOSIS — H269 Unspecified cataract: Secondary | ICD-10-CM

## 2019-08-31 NOTE — Progress Notes (Signed)
Referral placed per Optometry report that was received via fax for consultation for right cataracts

## 2019-09-02 ENCOUNTER — Other Ambulatory Visit: Payer: Self-pay

## 2019-09-02 ENCOUNTER — Encounter: Payer: Self-pay | Admitting: Family Medicine

## 2019-09-02 ENCOUNTER — Ambulatory Visit (INDEPENDENT_AMBULATORY_CARE_PROVIDER_SITE_OTHER): Payer: BC Managed Care – PPO | Admitting: Family Medicine

## 2019-09-02 VITALS — BP 139/77 | HR 80 | Temp 98.1°F | Ht 72.0 in | Wt 249.0 lb

## 2019-09-02 DIAGNOSIS — M79642 Pain in left hand: Secondary | ICD-10-CM | POA: Diagnosis not present

## 2019-09-02 DIAGNOSIS — M79641 Pain in right hand: Secondary | ICD-10-CM

## 2019-09-02 DIAGNOSIS — E1165 Type 2 diabetes mellitus with hyperglycemia: Secondary | ICD-10-CM

## 2019-09-02 NOTE — Assessment & Plan Note (Signed)
Will recheck A1C and adjust as needed. Discussed going up to 16-18 units daily as tolerated with his basaglar with goal fasting sugar range of 90-120. Continue working on lifestyle modifications for improvement as well

## 2019-09-02 NOTE — Progress Notes (Signed)
BP 139/77   Pulse 80   Temp 98.1 F (36.7 C) (Oral)   Ht 6' (1.829 m)   Wt 249 lb (112.9 kg)   SpO2 95%   BMI 33.77 kg/m    Subjective:    Patient ID: [name], male    DOB: 01/07/1961, 58 y.o.   MRN: [id number]  HPI: [name] is a 58 y.o. male  Chief Complaint  Patient presents with  . Diabetes   Here today for 3 month DM f/u after starting basaglar. Has been doing 12-14 units daily and BS readings have been 120-180 range. No low blood sugar spells or other side effects noted. Trying to eat better but still feels there's room for improvement. Still taking his other medicines faithfully without issue.   Still having b/l hand pain and stiffness as well as chronic low back pain. Thinks these issues are arthritic. Takes NSAIDs prn with some relief. Does not feel like it's gotten to the point yet where he wants to consult a specialist or it's significantly impacting daily life.   Relevant past medical, surgical, family and social history reviewed and updated as indicated. Interim medical history since our last visit reviewed. Allergies and medications reviewed and updated.  Review of Systems  Per HPI unless specifically indicated above     Objective:    BP 139/77   Pulse 80   Temp 98.1 F (36.7 C) (Oral)   Ht 6' (1.829 m)   Wt 249 lb (112.9 kg)   SpO2 95%   BMI 33.77 kg/m   Wt Readings from Last 3 Encounters:  09/02/19 249 lb (112.9 kg)  07/02/19 256 lb (116.1 kg)  12/24/18 249 lb (112.9 kg)    Physical Exam Vitals signs and nursing note reviewed.  Constitutional:      Appearance: Normal appearance.  HENT:     Head: Atraumatic.  Eyes:     Extraocular Movements: Extraocular movements intact.     Conjunctiva/sclera: Conjunctivae normal.  Neck:     Musculoskeletal: Normal range of motion and neck supple.  Cardiovascular:     Rate and Rhythm: Normal rate and regular rhythm.  Pulmonary:     Effort: Pulmonary effort is normal.     Breath sounds:  Normal breath sounds.  Musculoskeletal: Normal range of motion.  Skin:    General: Skin is warm and dry.  Neurological:     General: No focal deficit present.     Mental Status: He is oriented to person, place, and time.  Psychiatric:        Mood and Affect: Mood normal.        Thought Content: Thought content normal.        Judgment: Judgment normal.     Results for orders placed or performed in visit on 07/02/19  Cologuard  Result Value Ref Range   Cologuard Negative Negative      Assessment & Plan:   Problem List Items Addressed This Visit      Endocrine   Type 2 diabetes mellitus with hyperglycemia ([facility]) - Primary    Will recheck A1C and adjust as needed. Discussed going up to 16-18 units daily as tolerated with his basaglar with goal fasting sugar range of 90-120. Continue working on lifestyle modifications for improvement as well      Relevant Orders   HgB A1c    Other Visit Diagnoses    Bilateral hand pain       Suspect arthritic. Warm epsom salt soaks, NSAIDs,  massage. F/u if worsening or not improving       Follow up plan: Return in about 3 months (around 12/02/2019) for DM.

## 2019-09-03 LAB — HEMOGLOBIN A1C
Est. average glucose Bld gHb Est-mCnc: 177 mg/dL
Hgb A1c MFr Bld: 7.8 % — ABNORMAL HIGH (ref 4.8–5.6)

## 2019-09-04 ENCOUNTER — Telehealth: Payer: Self-pay

## 2019-09-04 ENCOUNTER — Ambulatory Visit: Payer: Self-pay | Admitting: Pharmacist

## 2019-09-04 NOTE — Chronic Care Management (AMB) (Signed)
  Chronic Care Management   Note  09/04/2019 Name: [name] MRN: [id number] DOB: Jan 16, 1961  [name] is a 58 y.o. year old male who is a primary care patient of Volney American, [place]. The CCM team was consulted for assistance with chronic disease management and care coordination needs.    Attempted to contact patient to follow up on medication management needs. Left HIPAA compliant message asking patient to return my call if he has any questions or concerns.   Follow up plan: - If I do not hear back, will outreach again in the next ~8 weeks for continued medication management support  Catie Darnelle Maffucci, PharmD Clinical Pharmacist [place] [phone]

## 2019-09-20 ENCOUNTER — Other Ambulatory Visit: Payer: Self-pay | Admitting: Family Medicine

## 2019-10-14 ENCOUNTER — Ambulatory Visit: Payer: Self-pay | Admitting: Pharmacist

## 2019-10-14 DIAGNOSIS — E1165 Type 2 diabetes mellitus with hyperglycemia: Secondary | ICD-10-CM

## 2019-10-14 NOTE — Chronic Care Management (AMB) (Signed)
Chronic Care Management   Follow Up Note   10/14/2019 Name: [name] MRN: [id number] DOB: Dec 12, 1960  Referred by: Volney American, PA-C Reason for referral : Chronic Care Management (Medication Management)   [name] is a 58 y.o. year old male who is a primary care patient of Volney American, PA-C. The CCM team was consulted for assistance with chronic disease management and care coordination needs.    Contacted patient for medication management review today.   Review of patient status, including review of consultants reports, relevant laboratory and other test results, and collaboration with appropriate care team members and the patient's provider was performed as part of comprehensive patient evaluation and provision of chronic care management services.    SDOH (Social Determinants of Health) screening performed today: Financial Strain . See Care Plan for related entries.   Advanced Directives Status: N See Care Plan and [employer]yn[employer] application for related entries.  Outpatient Encounter Medications as of 10/14/2019  Medication Sig Note  . blood glucose meter kit and supplies Dispense based on patient and insurance preference. Use up to four times daily as directed. (FOR ICD-10 E10.9, E11.9).   Marland Kitchen glipiZIDE (GLUCOTROL) 5 MG tablet Take 1 tablet (5 mg total) by mouth 2 (two) times daily before a meal.   . Insulin Glargine (BASAGLAR KWIKPEN) 100 UNIT/ML SOPN Inject 0.1 mLs (10 Units total) into the skin daily. 10/14/2019: 14-16 units daily   . Insulin Pen Needle (PEN NEEDLES) 32G X 6 MM MISC 1 Units by Does not apply route daily as needed.   . metFORMIN (GLUCOPHAGE) 1000 MG tablet Take 1 tablet (1,000 mg total) by mouth 2 (two) times daily with a meal.   . Accu-Chek FastClix Lancets MISC USE LANCET(S) TO CHECK GLUCOSE UP TO 4 TIMES DAILY   . ACCU-CHEK GUIDE test strip USE UP TO 4 TIMES DAILY AS DIRECTED   . aspirin EC 81 MG tablet Take 81 mg by mouth daily.   Marland Kitchen  atorvastatin (LIPITOR) 20 MG tablet Take 1 tablet by mouth once daily    No facility-administered encounter medications on file as of 10/14/2019.      Goals Addressed            This Visit's Progress     Patient Stated   . "I need to control my blood sugars" (pt-stated)       Current Barriers:  . Diabetes: uncontrolled but improved; most recent A1c 7.8%. o Improved with medication adherence related to affordability  . Antihyperglycemic regimen: metformin 1000 mg BID, Basaglar 14-16 units QAM, glipizide 5 mg BID o Previously prescribed Jardiance, though never started d/t cost  . Current blood glucose readings: Has not been checking blood sugars since last appointment with [name] . Cardiovascular risk reduction: o Current hypertensive regimen: n/a o Current hyperlipidemia regimen: atorvastatin 20 mg daily initiated at last office visit; LDL unable to be calculated d/t TG >700.    Pharmacist Clinical Goal(s):  Marland Kitchen Over the next 90 days, patient with work with PharmD and primary care provider to address optimized hyperglycemic management  Interventions: . Comprehensive medication review performed . Congratulated patient on continued improvement in A1c, encouraged to occasionally check blood sugars prior to f/u with [name] Roof, PA . Discussed goal A1c <7% and goal fasting <130, and  goal post prandial <180 . D/t obesity, consider d/c insulin and start GLP1, such as Ozempic, for fasting and post prandial glucose control as well as weight loss. Patient has follow up with [name]  [place] next month. Recommend directing patient to GLP1 copay card websites to aid in affordability.  Patient Self Care Activities:  . Patient will continue to check blood glucose daily document, and provide at future appointments . Patient will take medications as prescribed . Patient will report any questions or concerns to provider   Please see past updates related to this goal by clicking on the "Past Updates"  button in the selected goal          Plan: - Patient not interested in continuing to follow with CCM team. Provided w/ my contact information for any future questions or concerns  Catie Darnelle Maffucci, PharmD Clinical Pharmacist [place] [phone]

## 2019-10-14 NOTE — Patient Instructions (Signed)
Visit Information  Goals Addressed            This Visit's Progress     Patient Stated   . "I need to control my blood sugars" (pt-stated)       Current Barriers:  . Diabetes: uncontrolled but improved; most recent A1c 7.8%. o Improved with medication adherence related to affordability  . Antihyperglycemic regimen: metformin 1000 mg BID, Basaglar 14-16 units QAM, glipizide 5 mg BID o Previously prescribed Jardiance, though never started d/t cost  . Current blood glucose readings: Has not been checking blood sugars since last appointment with Apolonio Schneiders . Cardiovascular risk reduction: o Current hypertensive regimen: n/a o Current hyperlipidemia regimen: atorvastatin 20 mg daily initiated at last office visit; LDL unable to be calculated d/t TG >700.    Pharmacist Clinical Goal(s):  Marland Kitchen Over the next 90 days, patient with work with PharmD and primary care provider to address optimized hyperglycemic management  Interventions: . Comprehensive medication review performed . Congratulated patient on continued improvement in A1c, encouraged to occasionally check blood sugars prior to f/u with Merrie Roof, PA . Discussed goal A1c <7% and goal fasting <130, and  goal post prandial <180 . D/t obesity, consider d/c insulin and start GLP1, such as Ozempic, for fasting and post prandial glucose control as well as weight loss. Patient has follow up with Merrie Roof next month. Recommend directing patient to GLP1 copay card websites to aid in affordability.  Patient Self Care Activities:  . Patient will continue to check blood glucose daily document, and provide at future appointments . Patient will take medications as prescribed . Patient will report any questions or concerns to provider   Please see past updates related to this goal by clicking on the "Past Updates" button in the selected goal         The patient verbalized understanding of instructions provided today and declined a print copy  of patient instruction materials.   Plan: - Patient not interested in continuing to follow with CCM team. Provided w/ my contact information for any future questions or concerns  Catie Darnelle Maffucci, PharmD Clinical Pharmacist [place] [phone]

## 2019-11-04 DIAGNOSIS — E113393 Type 2 diabetes mellitus with moderate nonproliferative diabetic retinopathy without macular edema, bilateral: Secondary | ICD-10-CM | POA: Diagnosis not present

## 2019-11-13 ENCOUNTER — Telehealth: Payer: Self-pay | Admitting: Family Medicine

## 2019-11-13 MED ORDER — METFORMIN HCL 1000 MG PO TABS: 1000.0000 mg | ORAL_TABLET | Freq: Two times a day (BID) | ORAL | 1 refills | Status: DC

## 2019-11-13 MED ORDER — GLIPIZIDE 5 MG PO TABS: 5.0000 mg | ORAL_TABLET | Freq: Two times a day (BID) | ORAL | 1 refills | Status: DC

## 2019-11-13 MED ORDER — BASAGLAR KWIKPEN 100 UNIT/ML ~~LOC~~ SOPN: 10.0000 [IU] | PEN_INJECTOR | Freq: Every day | SUBCUTANEOUS | 2 refills | Status: DC

## 2019-11-13 NOTE — Telephone Encounter (Signed)
refilled 

## 2019-11-13 NOTE — Telephone Encounter (Signed)
Patient requesting metFORMIN (GLUCOPHAGE) 1000 MG tablet , glipiZIDE (GLUCOTROL) 5 MG tablet and insulin medicine not the needles , informed please allow 48 to 72 hour turn around time  [place] (N), [place] [facility] [place]

## 2019-11-13 NOTE — Telephone Encounter (Signed)
Routing to provider. Patient last seen 09/02/19 and has appointment 12/02/19.

## 2019-12-02 ENCOUNTER — Ambulatory Visit: Payer: BC Managed Care – PPO | Admitting: Family Medicine

## 2019-12-17 ENCOUNTER — Ambulatory Visit: Payer: BC Managed Care – PPO | Admitting: Family Medicine

## 2019-12-18 DIAGNOSIS — H25041 Posterior subcapsular polar age-related cataract, right eye: Secondary | ICD-10-CM | POA: Diagnosis not present

## 2019-12-18 DIAGNOSIS — E119 Type 2 diabetes mellitus without complications: Secondary | ICD-10-CM | POA: Diagnosis not present

## 2019-12-29 ENCOUNTER — Other Ambulatory Visit
Admission: RE | Admit: 2019-12-29 | Discharge: 2019-12-29 | Disposition: A | Discharge status: Home / Self Care | Payer: BC Managed Care – PPO | Source: Ambulatory Visit | Attending: Ophthalmology | Admitting: Ophthalmology

## 2019-12-29 ENCOUNTER — Other Ambulatory Visit: Payer: Self-pay

## 2019-12-29 DIAGNOSIS — Z01812 Encounter for preprocedural laboratory examination: Secondary | ICD-10-CM | POA: Diagnosis not present

## 2019-12-29 DIAGNOSIS — U071 COVID-19: Secondary | ICD-10-CM | POA: Diagnosis not present

## 2019-12-29 HISTORY — DX: COVID-19: U07.1

## 2019-12-30 LAB — SARS CORONAVIRUS 2 (TAT 6-24 HRS): SARS Coronavirus 2: POSITIVE — AB

## 2019-12-31 ENCOUNTER — Telehealth: Payer: Self-pay | Admitting: Nurse Practitioner

## 2019-12-31 ENCOUNTER — Encounter: Admission: RE | Payer: Self-pay | Source: Home / Self Care

## 2019-12-31 ENCOUNTER — Ambulatory Visit: Admission: RE | Admit: 2019-12-31 | Payer: BC Managed Care – PPO | Source: Home / Self Care

## 2019-12-31 SURGERY — PHACOEMULSIFICATION, CATARACT, WITH IOL INSERTION
Anesthesia: Topical | Laterality: Right

## 2019-12-31 NOTE — Telephone Encounter (Signed)
Called to Discuss with patient about Covid symptoms and the use of bamlanivimab, a monoclonal antibody infusion for those with mild to moderate Covid symptoms and at a high risk of hospitalization.     Pt is qualified for this infusion at the [facility] infusion center due to co-morbid conditions and/or a member of an at-risk group.     Patient Active Problem List   Diagnosis Date Noted  . ED (erectile dysfunction) 12/28/2018  . Obesity 12/24/2018  . Type 2 diabetes mellitus with hyperglycemia (HCC) 07/23/2017    Patient denies any symptoms at this time. Symptoms tier reviewed as well as criteria for ending isolation. Preventative practices reviewed. Patient verbalized understanding.    Patient advised to call back if he decides that he does want to get infusion. Callback number to the infusion center given. Patient advised to go to Urgent care or ED with severe symptoms.

## 2020-01-04 ENCOUNTER — Ambulatory Visit: Payer: Self-pay | Admitting: Family Medicine

## 2020-01-05 DIAGNOSIS — Z20828 Contact with and (suspected) exposure to other viral communicable diseases: Secondary | ICD-10-CM | POA: Diagnosis not present

## 2020-01-11 ENCOUNTER — Ambulatory Visit: Payer: Self-pay | Admitting: Family Medicine

## 2020-02-08 ENCOUNTER — Ambulatory Visit (INDEPENDENT_AMBULATORY_CARE_PROVIDER_SITE_OTHER): Payer: BC Managed Care – PPO | Admitting: Family Medicine

## 2020-02-08 ENCOUNTER — Other Ambulatory Visit: Payer: Self-pay

## 2020-02-08 ENCOUNTER — Encounter: Payer: Self-pay | Admitting: Family Medicine

## 2020-02-08 VITALS — BP 120/76 | HR 91 | Temp 98.3°F | Ht 72.0 in | Wt 249.0 lb

## 2020-02-08 DIAGNOSIS — E785 Hyperlipidemia, unspecified: Secondary | ICD-10-CM

## 2020-02-08 DIAGNOSIS — E1169 Type 2 diabetes mellitus with other specified complication: Secondary | ICD-10-CM | POA: Diagnosis not present

## 2020-02-08 DIAGNOSIS — E1165 Type 2 diabetes mellitus with hyperglycemia: Secondary | ICD-10-CM | POA: Diagnosis not present

## 2020-02-08 NOTE — Assessment & Plan Note (Signed)
Recheck A1C, adjust as needed. Recommended daily fasting BS checks and logging readings to bring to appts. Continue working on diet and exercise changes

## 2020-02-08 NOTE — Assessment & Plan Note (Signed)
Recheck lipids, adjust as needed. Continue current regimen 

## 2020-02-08 NOTE — Progress Notes (Signed)
BP 120/76   Pulse 91   Temp 98.3 F (36.8 C) (Oral)   Ht 6' (1.829 m)   Wt 249 lb (112.9 kg)   SpO2 98%   BMI 33.77 kg/m    Subjective:    Patient ID: [name], male    DOB: 25-Nov-1961, 59 y.o.   MRN: [id number]  HPI: [name] is a 59 y.o. male  Chief Complaint  Patient presents with  . Diabetes   Overall pretty compliant with medications but here and there missing days. Taking 16 units of basaglar daily in addition to glipizide and metformin. No low blood sugar episodes noted. Home BS readings - rarely checking it but in the afternoons after meals it's running 160-180 range. Diet could be improved, not exercising.   HLD - taking lipitor without issue. Denies CP, SOB, claudication myalgias.   Relevant past medical, surgical, family and social history reviewed and updated as indicated. Interim medical history since our last visit reviewed. Allergies and medications reviewed and updated.  Review of Systems  Per HPI unless specifically indicated above     Objective:    BP 120/76   Pulse 91   Temp 98.3 F (36.8 C) (Oral)   Ht 6' (1.829 m)   Wt 249 lb (112.9 kg)   SpO2 98%   BMI 33.77 kg/m   Wt Readings from Last 3 Encounters:  02/08/20 249 lb (112.9 kg)  09/02/19 249 lb (112.9 kg)  07/02/19 256 lb (116.1 kg)    Physical Exam Vitals and nursing note reviewed.  Constitutional:      Appearance: Normal appearance.  HENT:     Head: Atraumatic.  Eyes:     Extraocular Movements: Extraocular movements intact.     Conjunctiva/sclera: Conjunctivae normal.  Cardiovascular:     Rate and Rhythm: Normal rate and regular rhythm.  Pulmonary:     Effort: Pulmonary effort is normal.     Breath sounds: Normal breath sounds.  Musculoskeletal:        General: Normal range of motion.     Cervical back: Normal range of motion and neck supple.  Skin:    General: Skin is warm and dry.  Neurological:     General: No focal deficit present.     Mental Status: He is  oriented to person, place, and time.  Psychiatric:        Mood and Affect: Mood normal.        Thought Content: Thought content normal.        Judgment: Judgment normal.     Results for orders placed or performed during the hospital encounter of 12/29/19  SARS CORONAVIRUS 2 (TAT 6-24 HRS) Nasopharyngeal Nasopharyngeal Swab   Specimen: Nasopharyngeal Swab  Result Value Ref Range   SARS Coronavirus 2 POSITIVE (A) NEGATIVE      Assessment & Plan:   Problem List Items Addressed This Visit      Endocrine   Type 2 diabetes mellitus with hyperglycemia (HCC) - Primary    Recheck A1C, adjust as needed. Recommended daily fasting BS checks and logging readings to bring to appts. Continue working on diet and exercise changes      Relevant Orders   HgB A1c   Comprehensive metabolic panel   Hyperlipidemia associated with type 2 diabetes mellitus (HCC)    Recheck lipids, adjust as needed. Continue current regimen      Relevant Orders   Lipid Panel w/o Chol/HDL Ratio       Follow up plan:  Return in about 6 months (around 08/10/2020) for CPE.

## 2020-02-09 LAB — HEMOGLOBIN A1C
Est. average glucose Bld gHb Est-mCnc: 160 mg/dL
Hgb A1c MFr Bld: 7.2 % — ABNORMAL HIGH (ref 4.8–5.6)

## 2020-02-25 ENCOUNTER — Encounter: Payer: Self-pay | Admitting: Ophthalmology

## 2020-02-25 ENCOUNTER — Other Ambulatory Visit: Payer: Self-pay

## 2020-03-01 NOTE — Discharge Instructions (Signed)

## 2020-03-03 ENCOUNTER — Ambulatory Visit: Payer: BC Managed Care – PPO | Admitting: Anesthesiology

## 2020-03-03 ENCOUNTER — Ambulatory Visit
Admission: RE | Admit: 2020-03-03 | Discharge: 2020-03-03 | Disposition: A | Discharge status: Home / Self Care | Payer: BC Managed Care – PPO | Attending: Ophthalmology | Admitting: Ophthalmology

## 2020-03-03 ENCOUNTER — Encounter
Admission: RE | Disposition: A | Discharge status: Home / Self Care | Payer: Self-pay | Source: Home / Self Care | Attending: Ophthalmology

## 2020-03-03 ENCOUNTER — Other Ambulatory Visit: Payer: Self-pay

## 2020-03-03 ENCOUNTER — Encounter: Payer: Self-pay | Admitting: Ophthalmology

## 2020-03-03 DIAGNOSIS — Z8616 Personal history of COVID-19: Secondary | ICD-10-CM | POA: Diagnosis not present

## 2020-03-03 DIAGNOSIS — H25041 Posterior subcapsular polar age-related cataract, right eye: Secondary | ICD-10-CM | POA: Diagnosis not present

## 2020-03-03 DIAGNOSIS — Y838 Other surgical procedures as the cause of abnormal reaction of the patient, or of later complication, without mention of misadventure at the time of the procedure: Secondary | ICD-10-CM | POA: Insufficient documentation

## 2020-03-03 DIAGNOSIS — E1136 Type 2 diabetes mellitus with diabetic cataract: Secondary | ICD-10-CM | POA: Diagnosis not present

## 2020-03-03 DIAGNOSIS — H59211 Accidental puncture and laceration of right eye and adnexa during an ophthalmic procedure: Secondary | ICD-10-CM | POA: Insufficient documentation

## 2020-03-03 DIAGNOSIS — H25811 Combined forms of age-related cataract, right eye: Secondary | ICD-10-CM | POA: Diagnosis not present

## 2020-03-03 DIAGNOSIS — H2511 Age-related nuclear cataract, right eye: Secondary | ICD-10-CM | POA: Diagnosis not present

## 2020-03-03 HISTORY — DX: Unspecified osteoarthritis, unspecified site: M19.90

## 2020-03-03 HISTORY — DX: Gastro-esophageal reflux disease without esophagitis: K21.9

## 2020-03-03 HISTORY — PX: CATARACT EXTRACTION W/PHACO: SHX586

## 2020-03-03 HISTORY — DX: Pure hypercholesterolemia, unspecified: E78.00

## 2020-03-03 LAB — GLUCOSE, CAPILLARY
Glucose-Capillary: 164 mg/dL — ABNORMAL HIGH (ref 70–99)
Glucose-Capillary: 202 mg/dL — ABNORMAL HIGH (ref 70–99)

## 2020-03-03 SURGERY — PHACOEMULSIFICATION, CATARACT, WITH IOL INSERTION
Anesthesia: Monitor Anesthesia Care | Site: Eye | Laterality: Right

## 2020-03-03 MED ORDER — ARMC OPHTHALMIC DILATING DROPS
1.0000 "application " | OPHTHALMIC | Status: DC | PRN
  Administered 2020-03-03 (×3): 1 via OPHTHALMIC

## 2020-03-03 MED ORDER — ACETAMINOPHEN 160 MG/5ML PO SOLN: 325.0000 mg | Freq: Once | ORAL | Status: DC

## 2020-03-03 MED ORDER — NA CHONDROIT SULF-NA HYALURON 40-17 MG/ML IO SOLN
INTRAOCULAR | Status: DC | PRN
  Administered 2020-03-03: 1 mL via INTRAOCULAR

## 2020-03-03 MED ORDER — MIDAZOLAM HCL 2 MG/2ML IJ SOLN
INTRAMUSCULAR | Status: DC | PRN
  Administered 2020-03-03 (×2): 1 mg via INTRAVENOUS

## 2020-03-03 MED ORDER — TRIAMCINOLONE ACETONIDE 40 MG/ML IJ SUSP: INTRAMUSCULAR | Status: DC | PRN

## 2020-03-03 MED ORDER — TETRACAINE HCL 0.5 % OP SOLN
1.0000 [drp] | OPHTHALMIC | Status: DC | PRN
  Administered 2020-03-03 (×3): 1 [drp] via OPHTHALMIC

## 2020-03-03 MED ORDER — NEOMYCIN-POLYMYXIN-DEXAMETH 3.5-10000-0.1 OP OINT
TOPICAL_OINTMENT | OPHTHALMIC | Status: DC | PRN
  Administered 2020-03-03: 1 via OPHTHALMIC

## 2020-03-03 MED ORDER — TRIAMCINOLONE ACETONIDE 40 MG/ML IO SUSP
INTRAOCULAR | Status: DC | PRN
  Administered 2020-03-03: 40 mg via INTRAVITREAL

## 2020-03-03 MED ORDER — ACETYLCHOLINE CHLORIDE 20 MG IO SOLR
INTRAOCULAR | Status: DC | PRN
  Administered 2020-03-03: 20 mg via INTRAOCULAR

## 2020-03-03 MED ORDER — EPINEPHRINE PF 1 MG/ML IJ SOLN
INTRAOCULAR | Status: DC | PRN
  Administered 2020-03-03: 400 mL via OPHTHALMIC

## 2020-03-03 MED ORDER — LIDOCAINE HCL (PF) 2 % IJ SOLN
INTRAOCULAR | Status: DC | PRN
  Administered 2020-03-03: 1 mL via INTRAOCULAR

## 2020-03-03 MED ORDER — BRIMONIDINE TARTRATE-TIMOLOL 0.2-0.5 % OP SOLN
OPHTHALMIC | Status: DC | PRN
  Administered 2020-03-03: 1 [drp] via OPHTHALMIC

## 2020-03-03 MED ORDER — LACTATED RINGERS IV SOLN: 10.0000 mL/h | INTRAVENOUS | Status: DC

## 2020-03-03 MED ORDER — PROVISC 10 MG/ML IO SOLN
INTRAOCULAR | Status: DC | PRN
  Administered 2020-03-03: 0.55 mL via INTRAOCULAR

## 2020-03-03 MED ORDER — TETRACAINE 0.5 % OP SOLN OPTIME - NO CHARGE
OPHTHALMIC | Status: DC | PRN
  Administered 2020-03-03: 2 [drp] via OPHTHALMIC

## 2020-03-03 MED ORDER — ACETAMINOPHEN 325 MG PO TABS: 325.0000 mg | ORAL_TABLET | Freq: Once | ORAL | Status: DC

## 2020-03-03 MED ORDER — FENTANYL CITRATE (PF) 100 MCG/2ML IJ SOLN
INTRAMUSCULAR | Status: DC | PRN
  Administered 2020-03-03: 50 ug via INTRAVENOUS

## 2020-03-03 MED ORDER — TRYPAN BLUE 0.06 % OP SOLN
OPHTHALMIC | Status: DC | PRN
  Administered 2020-03-03: 0.5 mL via INTRAOCULAR

## 2020-03-03 MED ORDER — MOXIFLOXACIN HCL 0.5 % OP SOLN
OPHTHALMIC | Status: DC | PRN
  Administered 2020-03-03: 0.2 mL via OPHTHALMIC

## 2020-03-03 SURGICAL SUPPLY — 24 items
DISSECTOR HYDRO NUCLEUS 50X22 (MISCELLANEOUS) ×10 IMPLANT
DRSG TEGADERM 2-3/8X2-3/4 SM (GAUZE/BANDAGES/DRESSINGS) ×2 IMPLANT
GLOVE BIOGEL PI IND STRL 8 (GLOVE) ×1 IMPLANT
GLOVE BIOGEL PI INDICATOR 8 (GLOVE) ×2
GOWN STRL REUS W/ TWL LRG LVL3 (GOWN DISPOSABLE) ×1 IMPLANT
GOWN STRL REUS W/ TWL XL LVL3 (GOWN DISPOSABLE) ×1 IMPLANT
GOWN STRL REUS W/TWL LRG LVL3 (GOWN DISPOSABLE) ×1
GOWN STRL REUS W/TWL XL LVL3 (GOWN DISPOSABLE) ×1
KNIFE 45D UP 2.3 (MISCELLANEOUS) ×2 IMPLANT
LENS IOL ACRSF MP 15.0 (Intraocular Lens) IMPLANT
LENS IOL ACRYSOF POST 15.0 (Intraocular Lens) ×2 IMPLANT
LENS IOL DIOP 15.5 (Intraocular Lens) IMPLANT
LENS IOL TECNIS MONO 15.5 (Intraocular Lens) IMPLANT
PACK CATARACT (MISCELLANEOUS) ×2 IMPLANT
PACK DR. KING ARMS (PACKS) ×2 IMPLANT
PACK EYE AFTER SURG (MISCELLANEOUS) ×2 IMPLANT
PACK VIT ANT 23G (MISCELLANEOUS) ×1 IMPLANT
SOL BAL SALT 15ML (MISCELLANEOUS) ×6
SOLUTION BAL SALT 15ML (MISCELLANEOUS) IMPLANT
SOLUTION OPHTHALMIC SALT (MISCELLANEOUS) ×2 IMPLANT
SUT ETHILON 10-0 CS-B-6CS-B-6 (SUTURE) ×2
SUTURE EHLN 10-0 CS-B-6CS-B-6 (SUTURE) IMPLANT
WATER STERILE IRR 250ML POUR (IV SOLUTION) ×2 IMPLANT
WIPE NON LINTING 3.25X3.25 (MISCELLANEOUS) ×2 IMPLANT

## 2020-03-03 NOTE — Anesthesia Preprocedure Evaluation (Signed)
Anesthesia Evaluation  Patient identified by MRN, date of birth, ID band Patient awake    Reviewed: Allergy & Precautions, H&P , NPO status , Patient's Chart, lab work & pertinent test results  Airway Mallampati: II  TM Distance: >3 FB Neck ROM: full    Dental no notable dental hx.    Pulmonary    Pulmonary exam normal breath sounds clear to auscultation       Cardiovascular hypertension, Normal cardiovascular exam Rhythm:regular Rate:Normal     Neuro/Psych    GI/Hepatic GERD  ,  Endo/Other  diabetes  Renal/GU      Musculoskeletal   Abdominal   Peds  Hematology   Anesthesia Other Findings   Reproductive/Obstetrics                             Anesthesia Physical Anesthesia Plan  ASA: II  Anesthesia Plan: MAC   Post-op Pain Management:    Induction:   PONV Risk Score and Plan: 1 and Treatment may vary due to age or medical condition, Midazolam and TIVA  Airway Management Planned:   Additional Equipment:   Intra-op Plan:   Post-operative Plan:   Informed Consent: I have reviewed the patients History and Physical, chart, labs and discussed the procedure including the risks, benefits and alternatives for the proposed anesthesia with the patient or authorized representative who has indicated his/her understanding and acceptance.     Dental Advisory Given  Plan Discussed with: CRNA  Anesthesia Plan Comments:         Anesthesia Quick Evaluation

## 2020-03-03 NOTE — H&P (Signed)
   I have reviewed the patient's H&P and agree with its findings. There have been no interval changes.  Devonn Giampietro MD Ophthalmology 

## 2020-03-03 NOTE — Transfer of Care (Signed)
Immediate Anesthesia Transfer of Care Note  Patient: [name]  Procedure(s) Performed: CATARACT EXTRACTION PHACO AND INTRAOCULAR LENS PLACEMENT (IOC) RIGHT DIABETIC VISION BLUE (Right Eye)  Patient Location: PACU  Anesthesia Type: MAC  Level of Consciousness: awake, alert  and patient cooperative  Airway and Oxygen Therapy: Patient Spontanous Breathing  Post-op Assessment: Post-op Vital signs reviewed, Patient's Cardiovascular Status Stable, Respiratory Function Stable, Patent Airway and No signs of Nausea or vomiting  Post-op Vital Signs: Reviewed and stable  Complications: No apparent anesthesia complications

## 2020-03-03 NOTE — Anesthesia Procedure Notes (Signed)
Procedure Name: MAC Date/Time: 03/03/2020 7:35 AM Performed by: Vanetta Shawl, CRNA Pre-anesthesia Checklist: Patient identified, Emergency Drugs available, Suction available, Timeout performed and Patient being monitored Patient Re-evaluated:Patient Re-evaluated prior to induction Oxygen Delivery Method: Nasal cannula Placement Confirmation: positive ETCO2

## 2020-03-03 NOTE — Anesthesia Postprocedure Evaluation (Signed)
Anesthesia Post Note  Patient: [name]  Procedure(s) Performed: CATARACT EXTRACTION PHACO AND INTRAOCULAR LENS PLACEMENT (IOC) RIGHT DIABETIC VISION BLUE (Right Eye)     Patient location during evaluation: PACU Anesthesia Type: MAC Level of consciousness: awake and alert and oriented Pain management: satisfactory to patient Vital Signs Assessment: post-procedure vital signs reviewed and stable Respiratory status: spontaneous breathing, nonlabored ventilation and respiratory function stable Cardiovascular status: blood pressure returned to baseline and stable Postop Assessment: Adequate PO intake and No signs of nausea or vomiting Anesthetic complications: no    Raliegh Ip

## 2020-03-03 NOTE — Op Note (Signed)
  PREOPERATIVE DIAGNOSIS:  Nuclear sclerotic cataract of the RIGHT eye.   POSTOPERATIVE DIAGNOSIS:  Nuclear sclerotic cataract of the RIGHT eye.   OPERATIVE PROCEDURE: Cataract surgery OD   SURGEON:  Elliot Cousin, MD.   ANESTHESIA:  Anesthesiologist: Ranee Gosselin, MD CRNA: Jiles Garter, CRNA  1.      Managed anesthesia care. 2.     0.39ml of Shugarcaine was instilled following the paracentesis   COMPLICATIONS:  Insertion of sulcus lens; anterior vitrectomy   TECHNIQUE:   Divide and conquer   DESCRIPTION OF PROCEDURE:  The patient was examined and consented in the preoperative holding area where the aforementioned topical anesthesia was applied to the RIGHT eye and then brought back to the Operating Room where the RIGHT eye was prepped and draped in the usual sterile ophthalmic fashion and a lid speculum was placed. A paracentesis was created with the side port blade, the anterior chamber was washed out with trypan blue to stain the anterior capsule, and the anterior chamber was filled with viscoelastic. A near clear corneal incision was performed with the steel keratome. A continuous curvilinear capsulorrhexis was performed with a cystotome followed by the capsulorrhexis forceps. Hydrodissection and hydrodelineation were carried out with BSS on a blunt cannula. The lens was removed in a divide and conquer  technique and the remaining cortical material was removed with the irrigation-aspiration handpiece.  Towards the end of this process, an anterior capsular tear was noted that extended posteriorly.  The main incision was extended to 2.8 mm with a keratome, and a 3-piece IOL was inserted in the usual fashion, taking care to rotate the haptics away from the anterior tear.  The main incision was closed with a 10-0 nylon suture, a new paracentesis was created, and an anterior vitrectomy was performed after the injection of triescence.  The wounds were hydrated. The anterior chamber was flushed  and the eye was inflated to physiologic pressure. 0.104ml Vigamox was placed in the anterior chamber. The wounds were found to be water tight. The eye was dressed with maxitrol, and a patch and shield were placed over the eye. The patient was also given drops with which to begin a drop regimen tomorrow and will follow-up with me in one day. Implant Name Type Inv. Item Serial No. Manufacturer Lot No. LRB No. Used Action  LENS IOL DIOP 15.5 - [id number] Intraocular Lens LENS IOL DIOP 15.5 [id number] AMO  Right 1 Wasted  LENS IOL POST 15.0 - [id number] Intraocular Lens LENS IOL POST 15.0 [id number] ALCON  Right 1 Implanted    Procedure(s) with comments: CATARACT EXTRACTION PHACO AND INTRAOCULAR LENS PLACEMENT (IOC) RIGHT DIABETIC VISION BLUE (Right) - COVID ( + ) 12-29-2019  30.97 02:27.7  Electronically signed: Jalayia Bagheri 03/03/2020 9:54 AM

## 2020-04-04 ENCOUNTER — Other Ambulatory Visit: Payer: Self-pay | Admitting: Family Medicine

## 2020-04-04 NOTE — Telephone Encounter (Signed)
Medication Refill - Medication: Insulin Glargine (BASAGLAR KWIKPEN) 100 UNIT/ML SOPN   Preferred Pharmacy (with phone number or street name):  [facility] [address] [place]),  - [address] Phone:  [phone]  Fax:  [phone]       Agent: Please be advised that RX refills may take up to 3 business days. We ask that you follow-up with your pharmacy.

## 2020-04-04 NOTE — Telephone Encounter (Signed)
Requested medication (s) are due for refill today:  Yes  Requested medication (s) are on the active medication list:  yes  Future visit scheduled:  yes  Last Refill: 11/13/19; 15 ml., RF x 2  Note to clinic:  it is noted that pt. Is taking differently; in office note 09/02/19, dose was increased to take 16-18 units daily.  (pt. has reported he is taking 18 units daily)  Please update script to reflect higher dose.    Requested Prescriptions  Pending Prescriptions Disp Refills   Insulin Glargine (BASAGLAR KWIKPEN) 100 UNIT/ML 15 mL 2    Sig: Inject 0.1 mLs (10 Units total) into the skin daily.      Endocrinology:  Diabetes - Insulins Passed - 04/04/2020  4:19 PM      Passed - HBA1C is between 0 and 7.9 and within 180 days    HB A1C (BAYER DCA - WAIVED)  Date Value Ref Range Status  06/04/2019 10.5 (H) <7.0 % Final    Comment:                                          Diabetic Adult            <7.0                                       Healthy Adult        4.3 - 5.7                                                           (DCCT/NGSP) American Diabetes Association's Summary of Glycemic Recommendations for Adults with Diabetes: Hemoglobin A1c <7.0%. More stringent glycemic goals (A1c <6.0%) may further reduce complications at the cost of increased risk of hypoglycemia.    Hgb A1c MFr Bld  Date Value Ref Range Status  02/08/2020 7.2 (H) 4.8 - 5.6 % Final    Comment:             Prediabetes: 5.7 - 6.4          Diabetes: >6.4          Glycemic control for adults with diabetes: <7.0           Passed - Valid encounter within last 6 months    Recent Outpatient Visits           1 month ago Type 2 diabetes mellitus with hyperglycemia, without long-term current use of insulin [facility])   [facility], [place], [place]   7 months ago Type 2 diabetes mellitus with hyperglycemia, without long-term current use of insulin [facility])   [facility], [place], [place]   9 months ago Type 2 diabetes mellitus with hyperglycemia, without long-term current use of insulin [facility])   [facility], [place], [place]   10 months ago Type 2 diabetes mellitus with hyperglycemia, without long-term current use of insulin [facility])   [facility], [place], [place]   1 year ago Type 2 diabetes mellitus with hyperglycemia, without long-term current use of insulin ([place])  [facility] [place], [place], [place]       Future Appointments             In 1 month Maurice March, [place], PA-C [facility], [place]

## 2020-04-04 NOTE — Telephone Encounter (Signed)
Routing to provider  

## 2020-04-05 ENCOUNTER — Telehealth: Payer: Self-pay

## 2020-04-05 MED ORDER — BASAGLAR KWIKPEN 100 UNIT/ML ~~LOC~~ SOPN: 18.0000 [IU] | PEN_INJECTOR | Freq: Every day | SUBCUTANEOUS | 2 refills | Status: DC

## 2020-04-05 NOTE — Telephone Encounter (Signed)
PA for Basaglar initiated and submitted via Cover My Meds. Key: [id number]

## 2020-04-07 NOTE — Telephone Encounter (Signed)
PA denied. Will await denial letter for reasoning.

## 2020-04-13 NOTE — Telephone Encounter (Signed)
Received denial letter. Patient must try and fail 2 alternatives before Basaglar is covered. Alternatives are Lantus, Toujeo, Levemir, and [place].   Can one of these be sent in for the patient? Did not see any of these listed in historical medications.

## 2020-04-14 MED ORDER — TRESIBA FLEXTOUCH 100 UNIT/ML ~~LOC~~ SOPN: 18.0000 [IU] | PEN_INJECTOR | Freq: Every day | SUBCUTANEOUS | 2 refills | Status: DC

## 2020-04-14 NOTE — Telephone Encounter (Signed)
Sent in tresiba to switch the basaglar out due to coverage

## 2020-04-14 NOTE — Telephone Encounter (Signed)
Patient notified

## 2020-05-23 ENCOUNTER — Other Ambulatory Visit: Payer: Self-pay

## 2020-05-23 ENCOUNTER — Ambulatory Visit (INDEPENDENT_AMBULATORY_CARE_PROVIDER_SITE_OTHER): Payer: BC Managed Care – PPO | Admitting: Family Medicine

## 2020-05-23 ENCOUNTER — Encounter: Payer: Self-pay | Admitting: Family Medicine

## 2020-05-23 VITALS — BP 136/82 | HR 90 | Temp 98.3°F | Wt 247.0 lb

## 2020-05-23 DIAGNOSIS — E11621 Type 2 diabetes mellitus with foot ulcer: Secondary | ICD-10-CM | POA: Diagnosis not present

## 2020-05-23 DIAGNOSIS — L97519 Non-pressure chronic ulcer of other part of right foot with unspecified severity: Secondary | ICD-10-CM

## 2020-05-23 DIAGNOSIS — E1169 Type 2 diabetes mellitus with other specified complication: Secondary | ICD-10-CM | POA: Diagnosis not present

## 2020-05-23 DIAGNOSIS — E785 Hyperlipidemia, unspecified: Secondary | ICD-10-CM

## 2020-05-23 DIAGNOSIS — E1140 Type 2 diabetes mellitus with diabetic neuropathy, unspecified: Secondary | ICD-10-CM | POA: Diagnosis not present

## 2020-05-23 DIAGNOSIS — Z89432 Acquired absence of left foot: Secondary | ICD-10-CM | POA: Insufficient documentation

## 2020-05-23 DIAGNOSIS — E119 Type 2 diabetes mellitus without complications: Secondary | ICD-10-CM | POA: Insufficient documentation

## 2020-05-23 DIAGNOSIS — E1165 Type 2 diabetes mellitus with hyperglycemia: Secondary | ICD-10-CM | POA: Diagnosis not present

## 2020-05-23 DIAGNOSIS — Z89422 Acquired absence of other left toe(s): Secondary | ICD-10-CM | POA: Insufficient documentation

## 2020-05-23 DIAGNOSIS — Z794 Long term (current) use of insulin: Secondary | ICD-10-CM

## 2020-05-23 LAB — BAYER DCA HB A1C WAIVED: HB A1C (BAYER DCA - WAIVED): 7.3 % — ABNORMAL HIGH (ref <7.0)

## 2020-05-23 MED ORDER — ATORVASTATIN CALCIUM 20 MG PO TABS: 20.0000 mg | ORAL_TABLET | ORAL | 1 refills | Status: DC

## 2020-05-23 NOTE — Progress Notes (Signed)
BP 136/82   Pulse 90   Temp 98.3 F (36.8 C) (Oral)   Wt 247 lb (112 kg)   SpO2 96%   BMI 33.50 kg/m    Subjective:    Patient ID: [name], male    DOB: [birth year], 59 y.o.   MRN: [id number]  HPI: [name] is a 59 y.o. male  Chief Complaint  Patient presents with  . Diabetes  . Wound Check    pt states that has had an open wound on his right foot   Has a couple of wounds on his feet. Has been using alcohol and peroxide and witch hazel and neosporin. He has been keeping them covered. Seem like they've been staying about the same. Has not been following with podiatry.   DIABETES Hypoglycemic episodes:no Polydipsia/polyuria: yes Visual disturbance: yes Chest pain: no Paresthesias: yes Glucose Monitoring: no  Accucheck frequency: Not Checking Taking Insulin?: yes Blood Pressure Monitoring: not checking Retinal Examination: Up to Date Foot Exam: Done today Diabetic Education: Completed Pneumovax: Up to Date Influenza: not up to date Aspirin: yes   Has not been taking his atorvastatin because he heard bad things about it. Has not had any side effects from it.   Relevant past medical, surgical, family and social history reviewed and updated as indicated. Interim medical history since our last visit reviewed. Allergies and medications reviewed and updated.  Review of Systems  Constitutional: Negative.   Respiratory: Negative.   Cardiovascular: Negative.   Gastrointestinal: Negative.   Musculoskeletal: Negative.   Skin: Positive for wound. Negative for color change, pallor and rash.  Neurological: Negative.   Psychiatric/Behavioral: Negative.     Per HPI unless specifically indicated above     Objective:    BP 136/82   Pulse 90   Temp 98.3 F (36.8 C) (Oral)   Wt 247 lb (112 kg)   SpO2 96%   BMI 33.50 kg/m   Wt Readings from Last 3 Encounters:  05/23/20 247 lb (112 kg)  03/03/20 246 lb (111.6 kg)  02/08/20 249 lb (112.9 kg)    Physical  Exam Vitals and nursing note reviewed.  Constitutional:      General: He is not in acute distress.    Appearance: Normal appearance. He is obese. He is not ill-appearing, toxic-appearing or diaphoretic.  HENT:     Head: Normocephalic and atraumatic.     Right Ear: External ear normal.     Left Ear: External ear normal.     Nose: Nose normal.     Mouth/Throat:     Mouth: Mucous membranes are moist.     Pharynx: Oropharynx is clear.  Eyes:     General: No scleral icterus.       Right eye: No discharge.        Left eye: No discharge.     Extraocular Movements: Extraocular movements intact.     Conjunctiva/sclera: Conjunctivae normal.     Pupils: Pupils are equal, round, and reactive to light.  Cardiovascular:     Rate and Rhythm: Normal rate and regular rhythm.     Pulses: Normal pulses.     Heart sounds: Normal heart sounds. No murmur heard.  No friction rub. No gallop.   Pulmonary:     Effort: Pulmonary effort is normal. No respiratory distress.     Breath sounds: Normal breath sounds. No stridor. No wheezing, rhonchi or rales.  Chest:     Chest wall: No tenderness.  Musculoskeletal:  General: Normal range of motion.     Cervical back: Normal range of motion and neck supple.  Skin:    General: Skin is warm and dry.     Capillary Refill: Capillary refill takes less than 2 seconds.     Coloration: Skin is not jaundiced or pale.     Findings: No bruising, erythema, lesion or rash.  Neurological:     General: No focal deficit present.     Mental Status: He is alert and oriented to person, place, and time. Mental status is at baseline.  Psychiatric:        Mood and Affect: Mood normal.        Behavior: Behavior normal.        Thought Content: Thought content normal.        Judgment: Judgment normal.     Results for orders placed or performed in visit on 05/23/20  Bayer DCA Hb A1c Waived  Result Value Ref Range   HB A1C (BAYER DCA - WAIVED) 7.3 (H) <7.0 %        Assessment & Plan:   Problem List Items Addressed This Visit      Endocrine   Type 2 diabetes mellitus with hyperglycemia ([place]) - Primary    Stable with A1c of 7.3. Continue current regimen. Continue to monitor. Call with any concerns. Refills given today.       Relevant Medications   atorvastatin (LIPITOR) 20 MG tablet   Other Relevant Orders   Bayer DCA Hb A1c Waived (Completed)   Hyperlipidemia associated with type 2 diabetes mellitus ([place])    Encouraged him to take his medicine every other day. Continue to monitor. Call with any concerns.       Relevant Medications   atorvastatin (LIPITOR) 20 MG tablet   Type 2 diabetes mellitus with diabetic neuropathy, with long-term current use of insulin (HCC)   Relevant Medications   atorvastatin (LIPITOR) 20 MG tablet   Diabetic ulcer of toe of right foot associated with type 2 diabetes mellitus ([place])    Will get him into wound care. Referral generated today. Continue to monitor.       Relevant Medications   atorvastatin (LIPITOR) 20 MG tablet   Other Relevant Orders   AMB referral to wound care center       Follow up plan: Return in about 3 months (around 08/23/2020).

## 2020-05-23 NOTE — Assessment & Plan Note (Signed)
Encouraged him to take his medicine every other day. Continue to monitor. Call with any concerns.

## 2020-05-23 NOTE — Assessment & Plan Note (Signed)
Will get him into wound care. Referral generated today. Continue to monitor.

## 2020-05-23 NOTE — Assessment & Plan Note (Signed)
Stable with A1c of 7.3. Continue current regimen. Continue to monitor. Call with any concerns. Refills given today.

## 2020-06-07 ENCOUNTER — Encounter: Payer: BC Managed Care – PPO | Attending: Physician Assistant | Admitting: Physician Assistant

## 2020-06-07 ENCOUNTER — Other Ambulatory Visit: Payer: Self-pay | Admitting: Physician Assistant

## 2020-06-07 ENCOUNTER — Ambulatory Visit
Admission: RE | Admit: 2020-06-07 | Discharge: 2020-06-07 | Disposition: A | Discharge status: Home / Self Care | Payer: BC Managed Care – PPO | Source: Ambulatory Visit | Attending: Physician Assistant | Admitting: Physician Assistant

## 2020-06-07 ENCOUNTER — Ambulatory Visit
Admission: RE | Admit: 2020-06-07 | Discharge: 2020-06-07 | Disposition: A | Discharge status: Home / Self Care | Payer: BC Managed Care – PPO | Attending: Physician Assistant | Admitting: Physician Assistant

## 2020-06-07 ENCOUNTER — Other Ambulatory Visit
Admission: RE | Admit: 2020-06-07 | Discharge: 2020-06-07 | Disposition: A | Discharge status: Home / Self Care | Payer: BC Managed Care – PPO | Source: Home / Self Care | Attending: *Deleted | Admitting: *Deleted

## 2020-06-07 ENCOUNTER — Other Ambulatory Visit: Payer: Self-pay

## 2020-06-07 DIAGNOSIS — E11621 Type 2 diabetes mellitus with foot ulcer: Secondary | ICD-10-CM | POA: Insufficient documentation

## 2020-06-07 DIAGNOSIS — B999 Unspecified infectious disease: Secondary | ICD-10-CM | POA: Diagnosis not present

## 2020-06-07 DIAGNOSIS — E1140 Type 2 diabetes mellitus with diabetic neuropathy, unspecified: Secondary | ICD-10-CM | POA: Diagnosis not present

## 2020-06-07 DIAGNOSIS — H2512 Age-related nuclear cataract, left eye: Secondary | ICD-10-CM | POA: Diagnosis not present

## 2020-06-07 DIAGNOSIS — L97522 Non-pressure chronic ulcer of other part of left foot with fat layer exposed: Secondary | ICD-10-CM | POA: Diagnosis not present

## 2020-06-07 DIAGNOSIS — E1136 Type 2 diabetes mellitus with diabetic cataract: Secondary | ICD-10-CM | POA: Insufficient documentation

## 2020-06-07 DIAGNOSIS — M869 Osteomyelitis, unspecified: Secondary | ICD-10-CM

## 2020-06-07 DIAGNOSIS — L97529 Non-pressure chronic ulcer of other part of left foot with unspecified severity: Secondary | ICD-10-CM | POA: Insufficient documentation

## 2020-06-07 DIAGNOSIS — E7800 Pure hypercholesterolemia, unspecified: Secondary | ICD-10-CM | POA: Diagnosis not present

## 2020-06-07 DIAGNOSIS — M86172 Other acute osteomyelitis, left ankle and foot: Secondary | ICD-10-CM | POA: Diagnosis not present

## 2020-06-07 NOTE — Progress Notes (Addendum)
[name], [name] ([id number]) Visit Report for 06/07/2020 Allergy List Details Patient Name: [name], [name] Date of Service: 06/07/2020 9:15 AM Medical Record Number: [id number] Patient Account Number: [id number] Date of Birth/Sex: 1961/09/17 (59 y.o. M) Treating RN: Huel Coventry Primary Care Bard Haupert: Roosvelt Maser Other Clinician: Referring Zenaya Ulatowski: Roosvelt Maser Treating Risa Auman/Extender: Linwood Dibbles, HOYT Weeks in Treatment: 0 Allergies Active Allergies amoxicillin Allergy Notes Electronic Signature(s) Signed: 06/07/2020 12:55:53 PM By: Elliot Gurney, BSN, RN, CWS, Kim RN, BSN Entered By: Elliot Gurney, BSN, RN, CWS, Kim on 06/07/2020 10:55:51 Sabala, Elton ([id number]) -------------------------------------------------------------------------------- Arrival Information Details Patient Name: [name] Date of Service: 06/07/2020 9:15 AM Medical Record Number: [id number] Patient Account Number: [id number] Date of Birth/Sex: [birth year] (59 y.o. M) Treating RN: Huel Coventry Primary Care Vyolet Sakuma: Roosvelt Maser Other Clinician: Referring Ellen Goris: Roosvelt Maser Treating Koron Godeaux/Extender: Linwood Dibbles, HOYT Weeks in Treatment: 0 Visit Information Patient Arrived: Ambulatory Arrival Time: 09:47 Accompanied By: self Transfer Assistance: None Patient Identification Verified: Yes Secondary Verification Process Completed: Yes Patient Has Alerts: Yes Patient Alerts: Type II Diabetic Electronic Signature(s) Signed: 06/07/2020 12:55:53 PM By: Elliot Gurney, BSN, RN, CWS, Kim RN, BSN Entered By: Elliot Gurney, BSN, RN, CWS, Kim on 06/07/2020 09:53:26 [name], [name] ([id number]) -------------------------------------------------------------------------------- Clinic Level of Care Assessment Details Patient Name: [name] Date of Service: 06/07/2020 9:15 AM Medical Record Number: [id number] Patient Account Number: [id number] Date of Birth/Sex: [birth year] (59 y.o. M) Treating RN: Huel Coventry Primary Care Enza Shone: Roosvelt Maser Other Clinician: Referring Petro Talent: Roosvelt Maser Treating Keniesha Adderly/Extender: Linwood Dibbles, HOYT Weeks in Treatment: 0 Clinic Level of Care Assessment Items TOOL 1 Quantity Score  - Use when EandM and Procedure is performed on INITIAL visit 0 ASSESSMENTS - Nursing Assessment / Reassessment X - General Physical Exam (combine w/ comprehensive assessment (listed just below) when performed on new 1 20 pt. evals) X- 1 25 Comprehensive Assessment (HX, ROS, Risk Assessments, Wounds Hx, etc.) ASSESSMENTS - Wound and Skin Assessment / Reassessment  - Dermatologic / Skin Assessment (not related to wound area) 0 ASSESSMENTS - Ostomy and/or Continence Assessment and Care  - Incontinence Assessment and Management 0  - 0 Ostomy Care Assessment and Management (repouching, etc.) PROCESS - Coordination of Care X - Simple Patient / Family Education for ongoing care 1 15  - 0 Complex (extensive) Patient / Family Education for ongoing care  - 0 Staff obtains Chiropractor, Records, Test Results / Process Orders  - 0 Staff telephones HHA, Nursing Homes / Clarify orders / etc  - 0 Routine Transfer to another Facility (non-emergent condition)  - 0 Routine Hospital Admission (non-emergent condition) X- 1 15 New Admissions / Manufacturing engineer / Ordering NPWT, Apligraf, etc.  - 0 Emergency Hospital Admission (emergent condition) PROCESS - Special Needs  - Pediatric / Minor Patient Management 0  - 0 Isolation Patient Management  - 0 Hearing / Language / Visual special needs  - 0 Assessment of Community assistance (transportation, D/C planning, etc.)  - 0 Additional assistance / Altered mentation  - 0 Support Surface(s) Assessment (bed, cushion, seat, etc.) INTERVENTIONS - Miscellaneous  - External ear exam 0  - 0 Patient Transfer (multiple staff / Nurse, adult / Similar devices)  - 0 Simple Staple / Suture removal (25 or less)  - 0 Complex Staple  / Suture removal (26 or more)  - 0 Hypo/Hyperglycemic Management (do not check if billed separately) X- 1 15 Ankle / Brachial Index (ABI) - do not check if billed separately Has the patient been seen at the hospital within the last three  years: Yes Total Score: 90 Level Of Care: New/Established - Level 3 [name], [name] ([id number]) Electronic Signature(s) Signed: 06/07/2020 12:55:53 PM By: Elliot Gurney, BSN, RN, CWS, Kim RN, BSN Entered By: Elliot Gurney, BSN, RN, CWS, Kim on 06/07/2020 11:03:19 [name], [name] ([id number]) -------------------------------------------------------------------------------- [name] Date of Service: 06/07/2020 9:15 AM Medical Record Number: [id number] Patient Account Number: [id number] Date of Birth/Sex: November 27, 1961 (59 y.o. M) Treating RN: Huel Coventry Primary Care Marcel Sorter: Roosvelt Maser Other Clinician: Referring Jamila Slatten: Roosvelt Maser Treating Tallulah Hosman/Extender: Linwood Dibbles, HOYT Weeks in Treatment: 0 Encounter Discharge Information Items Post Procedure Vitals Discharge Condition: Stable Temperature (F): 98.4 Ambulatory Status: Ambulatory Pulse (bpm): 75 Discharge Destination: Home Respiratory Rate (breaths/min): 18 Transportation: Private Auto Blood Pressure (mmHg): 152/74 Accompanied By: self Schedule Follow-up Appointment: Yes Clinical Summary of Care: Electronic Signature(s) Signed: 06/07/2020 4:34:32 PM By: Elliot Gurney, BSN, RN, CWS, Kim RN, BSN Entered By: Elliot Gurney, BSN, RN, CWS, Kim on 06/07/2020 16:34:31 [name], [name] ([id number]) -------------------------------------------------------------------------------- Lower Extremity Assessment Details Patient Name: [name] Date of Service: 06/07/2020 9:15 AM Medical Record Number: [id number] Patient Account Number: [id number] Date of Birth/Sex: 08-Jun-1961 (59 y.o. M) Treating RN: Huel Coventry Primary Care Mallary Kreger: Roosvelt Maser Other Clinician: Referring  Everli Rother: Roosvelt Maser Treating Lorissa Kishbaugh/Extender: Linwood Dibbles, HOYT Weeks in Treatment: 0 Edema Assessment Assessed: [Left: Yes] [Right: Yes] Edema: [Left: No] [Right: No] Vascular Assessment Pulses: Dorsalis Pedis Palpable: [Left:Yes] [Right:Yes] Doppler Audible: [Left:Yes] [Right:Yes] Posterior Tibial Palpable: [Left:Yes] [Right:Yes] Doppler Audible: [Right:Yes] Blood Pressure: Brachial: [Left:122] Dorsalis Pedis: 190 [Left:Dorsalis Pedis: 200] Ankle: Posterior Tibial: 198 [Left:Posterior Tibial: 198 1.62] [Right:1.64] Electronic Signature(s) Signed: 06/07/2020 12:55:53 PM By: Elliot Gurney, BSN, RN, CWS, Kim RN, BSN Entered By: Elliot Gurney, BSN, RN, CWS, Kim on 06/07/2020 10:11:31 [name], [name] ([id number]) -------------------------------------------------------------------------------- Multi Wound Chart Details Patient Name: [name] Date of Service: 06/07/2020 9:15 AM Medical Record Number: [id number] Patient Account Number: [id number] Date of Birth/Sex: Aug 19, 1961 (59 y.o. M) Treating RN: Huel Coventry Primary Care Luanna Weesner: Roosvelt Maser Other Clinician: Referring Jovi Zavadil: Roosvelt Maser Treating Jaylissa Felty/Extender: Linwood Dibbles, HOYT Weeks in Treatment: 0 Vital Signs Height(in): 72 Pulse(bpm): 75 Weight(lbs): 249 Blood Pressure(mmHg): 152/74 Body Mass Index(BMI): 34 Temperature(F): 98.4 Respiratory Rate(breaths/min): 18 Photos: Wound Location: Right, Plantar Foot Left, Anterior Toe Great Left, Circumferential Toe Second Wounding Event: Trauma Gradually Appeared Gradually Appeared Primary Etiology: Trauma, Other Diabetic Wound/Ulcer of the Lower Diabetic Wound/Ulcer of the Lower Extremity Extremity Secondary Etiology: Diabetic Wound/Ulcer of the Lower N/A N/A Extremity Comorbid History: Cataracts, Type II Diabetes, Cataracts, Type II Diabetes, Cataracts, Type II Diabetes, Neuropathy Neuropathy Neuropathy Date Acquired: 05/17/2020 05/07/2020 05/17/2020 Weeks of Treatment: 0 0  0 Wound Status: Open Open Open Measurements L x W x D (cm) 0.5x0.6x0.3 1.1x1.5x0.2 7x6x2.2 Area (cm) : 0.236 1.296 32.987 Volume (cm) : 0.071 0.259 72.571 % Reduction in Area: 0.00% 0.00% N/A % Reduction in Volume: 0.00% 0.00% N/A Starting Position 1 (o'clock): 8 8 Ending Position 1 (o'clock): 5 2 Maximum Distance 1 (cm): 0.4 0.9 Undermining: Yes Yes No Classification: Full Thickness Without Exposed Grade 1 Grade 4 Support Structures Wagner Verification: N/A N/A Other Exudate Amount: Medium Medium Medium Exudate Type: Serous Serous Serous Exudate Color: amber amber amber Wound Margin: Flat and Intact Epibole Flat and Intact Granulation Amount: Large (67-100%) None Present (0%) None Present (0%) Granulation Quality: Red N/A N/A Necrotic Amount: Small (1-33%) Large (67-100%) Large (67-100%) Necrotic Tissue: Adherent [address], Adherent [place], [place] ([id number]) Exposed Structures: Fat Layer (Subcutaneous Tissue) Fat Layer (Subcutaneous Tissue) Fat Layer (  Subcutaneous Tissue) Exposed: Yes Exposed: Yes Exposed: Yes Fascia: No Fascia: No Fascia: No Tendon: No Tendon: No Tendon: No Muscle: No Muscle: No Muscle: No Joint: No Joint: No Joint: No Bone: No Bone: No Bone: No Epithelialization: None None None Treatment Notes Electronic Signature(s) Signed: 06/07/2020 12:55:53 PM By: Elliot Gurney, BSN, RN, CWS, Kim RN, BSN Entered By: Elliot Gurney, BSN, RN, CWS, Kim on 06/07/2020 10:47:56 [name], [name] ([id number]) -------------------------------------------------------------------------------- Multi-Disciplinary Care Plan Details Patient Name: [name] Date of Service: 06/07/2020 9:15 AM Medical Record Number: [id number] Patient Account Number: [id number] Date of Birth/Sex: [birth year] (59 y.o. M) Treating RN: Huel Coventry Primary Care Adley Castello: Roosvelt Maser Other Clinician: Referring Prosper Paff: Roosvelt Maser Treating Bernerd Terhune/Extender: Linwood Dibbles,  HOYT Weeks in Treatment: 0 Active Inactive Necrotic Tissue Nursing Diagnoses: Impaired tissue integrity related to necrotic/devitalized tissue Goals: Necrotic/devitalized tissue will be minimized in the wound bed Date Initiated: 06/07/2020 Target Resolution Date: 07/06/2020 Goal Status: Active Interventions: Assess patient pain level pre-, during and post procedure and prior to discharge Treatment Activities: Excisional debridement : 06/07/2020 Notes: Orientation to the Wound Care Program Nursing Diagnoses: Knowledge deficit related to the wound healing center program Goals: Patient/caregiver will verbalize understanding of the Wound Healing Center Program Date Initiated: 06/07/2020 Target Resolution Date: 07/06/2020 Goal Status: Active Interventions: Provide education on orientation to the wound center Notes: Osteomyelitis Nursing Diagnoses: Potential for infection: osteomyelitis Goals: Diagnostic evaluation for osteomyelitis completed as ordered Date Initiated: 06/07/2020 Target Resolution Date: 07/06/2020 Goal Status: Active Patient/caregiver will verbalize understanding of disease process and disease management Date Initiated: 06/07/2020 Target Resolution Date: 07/06/2020 Goal Status: Active Patient's osteomyelitis will resolve Date Initiated: 06/07/2020 Target Resolution Date: 07/06/2020 Goal Status: Active Signs and symptoms for osteomyelitis will be recognized and promptly addressed Date Initiated: 06/07/2020 Target Resolution Date: 07/06/2020 Goal Status: Active Interventions: Assess for signs and symptoms of osteomyelitis resolution every visit [name], [name] ([id number]) Provide education on osteomyelitis Notes: Peripheral Neuropathy Nursing Diagnoses: Knowledge deficit related to disease process and management of peripheral neurovascular dysfunction Goals: Patient/caregiver will verbalize understanding of disease process and disease management Date Initiated:  06/07/2020 Target Resolution Date: 07/06/2020 Goal Status: Active Interventions: Assess signs and symptoms of neuropathy upon admission and as needed Provide education on Management of Neuropathy and Related Ulcers Notes: Pressure Nursing Diagnoses: Knowledge deficit related to management of pressures ulcers Goals: Patient will remain free from development of additional pressure ulcers Date Initiated: 06/07/2020 Target Resolution Date: 07/06/2020 Goal Status: Active Patient will remain free of pressure ulcers Date Initiated: 06/07/2020 Target Resolution Date: 07/06/2020 Goal Status: Active Interventions: Assess: immobility, friction, shearing, incontinence upon admission and as needed Notes: Soft Tissue Infection Nursing Diagnoses: Impaired tissue integrity Goals: Patient/caregiver will verbalize understanding of or measures to prevent infection and contamination in the home setting Date Initiated: 06/07/2020 Target Resolution Date: 07/06/2020 Goal Status: Active Interventions: Assess signs and symptoms of infection every visit Treatment Activities: Culture and sensitivity : 06/07/2020 Notes: Wound/Skin Impairment Nursing Diagnoses: Impaired tissue integrity Goals: Patient/caregiver will verbalize understanding of skin care regimen Date Initiated: 06/07/2020 Target Resolution Date: 07/06/2020 Goal Status: Active Ulcer/skin breakdown will have a volume reduction of 30% by week 4 Date Initiated: 06/07/2020 Target Resolution Date: 07/06/2020 Goal Status: Active [name], [name] ([id number]) Interventions: Assess patient/caregiver ability to obtain necessary supplies Assess ulceration(s) every visit Treatment Activities: Referred to DME Taneya Conkel for dressing supplies : 06/07/2020 Notes: Electronic Signature(s) Signed: 06/07/2020 12:55:53 PM By: Elliot Gurney, BSN, RN, CWS, Kim RN, BSN Entered By: Elliot Gurney, BSN, RN, CWS, Kim on 06/07/2020 10:47:38 Estis,  Jourdan  ([id number]) -------------------------------------------------------------------------------- Pain Assessment Details Patient Name: [name], [name] Date of Service: 06/07/2020 9:15 AM Medical Record Number: [id number] Patient Account Number: [id number] Date of Birth/Sex: [birth year] (59 y.o. M) Treating RN: Huel Coventry Primary Care Pershing Skidmore: Roosvelt Maser Other Clinician: Referring Heinrich Fertig: Roosvelt Maser Treating Ziasia Lenoir/Extender: Linwood Dibbles, HOYT Weeks in Treatment: 0 Active Problems Location of Pain Severity and Description of Pain Patient Has Paino No Site Locations Pain Management and Medication Current Pain Management: Notes Patient denies pain at this time. Electronic Signature(s) Signed: 06/07/2020 12:55:53 PM By: Elliot Gurney, BSN, RN, CWS, Kim RN, BSN Entered By: Elliot Gurney, BSN, RN, CWS, Kim on 06/07/2020 09:53:44 Dech, Marshaun ([id number]) -------------------------------------------------------------------------------- Patient/Caregiver Education Details Patient Name: [name] Date of Service: 06/07/2020 9:15 AM Medical Record Number: [id number] Patient Account Number: [id number] Date of Birth/Gender: Aug 04, 1961 (59 y.o. M) Treating RN: Huel Coventry Primary Care Physician: Roosvelt Maser Other Clinician: Referring Physician: Roosvelt Maser Treating Physician/Extender: Skeet Simmer in Treatment: 0 Education Assessment Education Provided To: Patient Education Topics Provided Infection: Handouts: Infection Prevention and Management Methods: Demonstration, Explain/Verbal Responses: State content correctly Peripheral Neuropathy: Handouts: Diabetes Methods: Demonstration, Explain/Verbal Responses: State content correctly Welcome To The Wound Care Center: Handouts: Welcome To The Wound Care Center Methods: Demonstration, Explain/Verbal Responses: Refused information Wound/Skin Impairment: Handouts: Caring for Your Ulcer Methods: Demonstration, Explain/Verbal Responses:  State content correctly Electronic Signature(s) Signed: 06/07/2020 12:55:53 PM By: Elliot Gurney, BSN, RN, CWS, Kim RN, BSN Entered By: Elliot Gurney, BSN, RN, CWS, Kim on 06/07/2020 11:04:36 [name], [name] ([id number]) -------------------------------------------------------------------------------- Wound Assessment Details Patient Name: [name] Date of Service: 06/07/2020 9:15 AM Medical Record Number: [id number] Patient Account Number: [id number] Date of Birth/Sex: 1961/01/24 (59 y.o. M) Treating RN: Huel Coventry Primary Care Jasaiah Karwowski: Roosvelt Maser Other Clinician: Referring Pollie Poma: Roosvelt Maser Treating Jalexis Breed/Extender: Linwood Dibbles, HOYT Weeks in Treatment: 0 Wound Status Wound Number: 1 Primary Etiology: Trauma, Other Wound Location: Right, Plantar Foot Secondary Etiology: Diabetic Wound/Ulcer of the Lower Extremity Wounding Event: Trauma Wound Status: Open Date Acquired: 05/17/2020 Comorbid History: Cataracts, Type II Diabetes, Neuropathy Weeks Of Treatment: 0 Clustered Wound: No Photos Wound Measurements Length: (cm) 0.5 Width: (cm) 0.6 Depth: (cm) 0.3 Area: (cm) 0.236 Volume: (cm) 0.071 % Reduction in Area: 0% % Reduction in Volume: 0% Epithelialization: None Tunneling: No Undermining: Yes Starting Position (o'clock): 8 Ending Position (o'clock): 5 Maximum Distance: (cm) 0.4 Wound Description Classification: Full Thickness Without Exposed Support Structu Wound Margin: Flat and Intact Exudate Amount: Medium Exudate Type: Serous Exudate Color: amber res Foul Odor After Cleansing: No Slough/Fibrino Yes Wound Bed Granulation Amount: Large (67-100%) Exposed Structure Granulation Quality: Red Fascia Exposed: No Necrotic Amount: Small (1-33%) Fat Layer (Subcutaneous Tissue) Exposed: Yes Necrotic Quality: Adherent Slough Tendon Exposed: No Muscle Exposed: No Joint Exposed: No Bone Exposed: No Treatment Notes Wound #1 (Right, Plantar Foot) [name], [name]  ([id number]) Notes Silvercell, gauze and conform Electronic Signature(s) Signed: 06/07/2020 12:55:53 PM By: Elliot Gurney, BSN, RN, CWS, Kim RN, BSN Entered By: Elliot Gurney, BSN, RN, CWS, Kim on 06/07/2020 10:19:20 Pleitez, Amarion ([id number]) -------------------------------------------------------------------------------- Wound Assessment Details Patient Name: [name] Date of Service: 06/07/2020 9:15 AM Medical Record Number: [id number] Patient Account Number: [id number] Date of Birth/Sex: 05-17-1961 (59 y.o. M) Treating RN: Huel Coventry Primary Care Antwane Grose: Roosvelt Maser Other Clinician: Referring Waverly Tarquinio: Roosvelt Maser Treating [name] Bancroft/Extender: Linwood Dibbles, HOYT Weeks in Treatment: 0 Wound Status Wound Number: 2 Primary Etiology: Diabetic Wound/Ulcer of the Lower Extremity Wound Location: Left, Anterior Toe Great Wound Status: Open Wounding Event: Gradually Appeared Comorbid History: Cataracts,  Type II Diabetes, Neuropathy Date Acquired: 05/07/2020 Weeks Of Treatment: 0 Clustered Wound: No Photos Wound Measurements Length: (cm) 1.1 % R Width: (cm) 1.5 % R Depth: (cm) 0.2 Epi Area: (cm) 1.296 Tu Volume: (cm) 0.259 Un eduction in Area: 0% eduction in Volume: 0% thelialization: None nneling: No dermining: Yes Starting Position (o'clock): 8 Ending Position (o'clock): 2 Maximum Distance: (cm) 0.9 Wound Description Classification: Grade 1 Fou Wound Margin: Epibole Slo Exudate Amount: Medium Exudate Type: Serous Exudate Color: amber l Odor After Cleansing: No ugh/Fibrino Yes Wound Bed Granulation Amount: None Present (0%) Exposed Structure Necrotic Amount: Large (67-100%) Fascia Exposed: No Necrotic Quality: Adherent Slough Fat Layer (Subcutaneous Tissue) Exposed: Yes Tendon Exposed: No Muscle Exposed: No Joint Exposed: No Bone Exposed: No Treatment Notes Wound #2 (Left, Anterior Toe Great) Weppler, Wolfe ([id number]) Notes Silvercell, gauze and conform Electronic  Signature(s) Signed: 06/07/2020 12:55:53 PM By: Elliot GurneyWoody, BSN, RN, CWS, Kim RN, BSN Entered By: Elliot GurneyWoody, BSN, RN, CWS, Kim on 06/07/2020 10:21:39 Ertle, Trever ([id number]) -------------------------------------------------------------------------------- Wound Assessment Details Patient Name: [name] LenzNORRIS, Kindred Date of Service: 06/07/2020 9:15 AM Medical Record Number: [id number] Patient Account Number: [id number] Date of Birth/Sex: 05/16/1961 (59 y.o. M) Treating RN: Huel CoventryWoody, Kim Primary Care Tyiesha Brackney: Roosvelt MaserLANE, RACHEL Other Clinician: Referring Manaia Samad: Roosvelt MaserLANE, RACHEL Treating Chaim Gatley/Extender: Linwood DibblesSTONE III, HOYT Weeks in Treatment: 0 Wound Status Wound Number: 3 Primary Etiology: Diabetic Wound/Ulcer of the Lower Extremity Wound Location: Left, Circumferential Toe Second Wound Status: Open Wounding Event: Gradually Appeared Comorbid History: Cataracts, Type II Diabetes, Neuropathy Date Acquired: 05/17/2020 Weeks Of Treatment: 0 Clustered Wound: No Photos Wound Measurements Length: (cm) 7 Width: (cm) 6 Depth: (cm) 2.2 Area: (cm) 32.987 Volume: (cm) 72.571 % Reduction in Area: 0% % Reduction in Volume: 0% Epithelialization: None Tunneling: No Undermining: No Wound Description Classification: Grade 4 Wound Margin: Flat and Intact Exudate Amount: Medium Exudate Type: Serous Exudate Color: amber Foul Odor After Cleansing: No Slough/Fibrino Yes Wound Bed Granulation Amount: None Present (0%) Exposed Structure Necrotic Amount: Large (67-100%) Fascia Exposed: No Necrotic Quality: Eschar, Adherent Slough Fat Layer (Subcutaneous Tissue) Exposed: Yes Tendon Exposed: No Muscle Exposed: No Joint Exposed: No Bone Exposed: Yes Treatment Notes Wound #3 (Left, Circumferential Toe Second) Notes Silvercell, gauze and conform Electronic Signature(s) Signed: 06/08/2020 8:52:01 AM By: Elliot GurneyWoody, BSN, RN, CWS, Kim RN, BSN CliftonNORRIS, Mihir ([id number]) Previous Signature: 06/07/2020 12:55:53 PM Version  By: Elliot GurneyWoody, BSN, RN, CWS, Kim RN, BSN Entered By: Elliot GurneyWoody, BSN, RN, CWS, Kim on 06/08/2020 08:52:01 Drach, Kaliel ([id number]) -------------------------------------------------------------------------------- Vitals Details Patient Name: [name] LenzNORRIS, Jiovani Date of Service: 06/07/2020 9:15 AM Medical Record Number: [id number] Patient Account Number: [id number] Date of Birth/Sex: 07/22/1961 (59 y.o. M) Treating RN: Huel CoventryWoody, Kim Primary Care Jarrid Lienhard: Roosvelt MaserLANE, RACHEL Other Clinician: Referring Kandas Oliveto: Roosvelt MaserLANE, RACHEL Treating Laneya Gasaway/Extender: Linwood DibblesSTONE III, HOYT Weeks in Treatment: 0 Vital Signs Time Taken: 09:47 Temperature (F): 98.4 Height (in): 72 Pulse (bpm): 75 Weight (lbs): 249 Respiratory Rate (breaths/min): 18 Body Mass Index (BMI): 33.8 Blood Pressure (mmHg): 152/74 Reference Range: 80 - 120 mg / dl Electronic Signature(s) Signed: 06/07/2020 12:55:53 PM By: Elliot GurneyWoody, BSN, RN, CWS, Kim RN, BSN Entered By: Elliot GurneyWoody, BSN, RN, CWS, Kim on 06/07/2020 09:54:24

## 2020-06-07 NOTE — Progress Notes (Signed)
[name], Pickel [name] ([id number]) Visit Report for 06/07/2020 Abuse/Suicide Risk Screen Details Patient Name: [name], [name] Date of Service: 06/07/2020 9:15 AM Medical Record Number: [id number] Patient Account Number: [id number] Date of Birth/Sex: 04-02-1961 (59 y.o. M) Treating RN: Huel Coventry Primary Care Sergei Delo: Roosvelt Maser Other Clinician: Referring Archit Leger: Roosvelt Maser Treating Liliana Dang/Extender: Linwood Dibbles, HOYT Weeks in Treatment: 0 Abuse/Suicide Risk Screen Items Answer ABUSE RISK SCREEN: Has anyone close to you tried to hurt or harm you recentlyo No Do you feel uncomfortable with anyone in your familyo No Has anyone forced you do things that you didnot want to doo No Electronic Signature(s) Signed: 06/07/2020 12:55:53 PM By: Elliot Gurney, BSN, RN, CWS, Kim RN, BSN Entered By: Elliot Gurney, BSN, RN, CWS, Kim on 06/07/2020 09:59:44 Kleckley, Shawna ([id number]) -------------------------------------------------------------------------------- Activities of Daily Living Details Patient Name: [name] Date of Service: 06/07/2020 9:15 AM Medical Record Number: [id number] Patient Account Number: [id number] Date of Birth/Sex: June 09, 1961 (59 y.o. M) Treating RN: Huel Coventry Primary Care Azariya Freeman: Roosvelt Maser Other Clinician: Referring Sameen Leas: Roosvelt Maser Treating Nohemy Koop/Extender: Linwood Dibbles, HOYT Weeks in Treatment: 0 Activities of Daily Living Items Answer Activities of Daily Living (Please select one for each item) Drive Automobile Completely Able Take Medications Completely Able Use Telephone Completely Able Care for Appearance Completely Able Use Toilet Completely Able Bath / Shower Completely Able Dress Self Completely Able Feed Self Completely Able Walk Completely Able Get In / Out Bed Completely Able Housework Completely Able Prepare Meals Completely Able Handle Money Completely Able Shop for Self Completely Able Electronic Signature(s) Signed: 06/07/2020 12:55:53 PM By:  Elliot Gurney, BSN, RN, CWS, Kim RN, BSN Entered By: Elliot Gurney, BSN, RN, CWS, Kim on 06/07/2020 09:59:56 Kuhnert, Kauan ([id number]) -------------------------------------------------------------------------------- Education Screening Details Patient Name: [name] Date of Service: 06/07/2020 9:15 AM Medical Record Number: [id number] Patient Account Number: [id number] Date of Birth/Sex: 17-Dec-1960 (59 y.o. M) Treating RN: Huel Coventry Primary Care [name]: Roosvelt Maser Other Clinician: Referring Chandra Asher: Roosvelt Maser Treating Itali Mckendry/Extender: Linwood Dibbles, HOYT Weeks in Treatment: 0 Primary Learner Assessed: Patient Learning Preferences/Education Level/Primary Language Learning Preference: Explanation, Demonstration Highest Education Level: College or Above Preferred Language: English Cognitive Barrier Language Barrier: No Translator Needed: No Memory Deficit: No Emotional Barrier: No Physical Barrier Impaired Vision: No Impaired Hearing: No Decreased Hand dexterity: No Knowledge/Comprehension Knowledge Level: High Comprehension Level: High Ability to understand written instructions: High Ability to understand verbal instructions: High Motivation Anxiety Level: Calm Cooperation: Cooperative Education Importance: Acknowledges Need Interest in Health Problems: Uninterested Perception: Coherent Willingness to Engage in Self-Management High Activities: Readiness to Engage in Self-Management High Activities: Psychologist, prison and probation services) Signed: 06/07/2020 12:55:53 PM By: Elliot Gurney, BSN, RN, CWS, Kim RN, BSN Entered By: Elliot Gurney, BSN, RN, CWS, Kim on 06/07/2020 10:00:26 Olson, Amaury ([id number]) -------------------------------------------------------------------------------- Fall Risk Assessment Details Patient Name: [name] Date of Service: 06/07/2020 9:15 AM Medical Record Number: [id number] Patient Account Number: [id number] Date of Birth/Sex: [birth year] (59 y.o. M) Treating RN:  Huel Coventry Primary Care Idriss Quackenbush: Roosvelt Maser Other Clinician: Referring Jasreet Dickie: Roosvelt Maser Treating Tikita Mabee/Extender: Linwood Dibbles, HOYT Weeks in Treatment: 0 Fall Risk Assessment Items Have you had 2 or more falls in the last 12 monthso 0 No Have you had any fall that resulted in injury in the last 12 monthso 0 No FALLS RISK SCREEN History of falling - immediate or within 3 months 0 No Secondary diagnosis (Do you have 2 or more medical diagnoseso) 0 No Ambulatory aid None/bed rest/wheelchair/nurse 0 Yes Crutches/cane/walker 0 No Furniture 0 No Intravenous therapy Access/Saline/Heparin Lock 0 No  Gait/Transferring Normal/ bed rest/ wheelchair 0 Yes Weak (short steps with or without shuffle, stooped but able to lift head while walking, may 0 No seek support from furniture) Impaired (short steps with shuffle, may have difficulty arising from chair, head down, impaired 0 No balance) Mental Status Oriented to own ability 0 Yes Electronic Signature(s) Signed: 06/07/2020 12:55:53 PM By: Elliot Gurney, BSN, RN, CWS, Kim RN, BSN Entered By: Elliot Gurney, BSN, RN, CWS, Kim on 06/07/2020 10:00:36 Gibbins, Timm ([id number]) -------------------------------------------------------------------------------- Foot Assessment Details Patient Name: [name] Date of Service: 06/07/2020 9:15 AM Medical Record Number: [id number] Patient Account Number: [id number] Date of Birth/Sex: 1961-02-07 (59 y.o. M) Treating RN: Huel Coventry Primary Care Jabier Deese: Roosvelt Maser Other Clinician: Referring Solana Coggin: Roosvelt Maser Treating Candy Ziegler/Extender: Linwood Dibbles, HOYT Weeks in Treatment: 0 Foot Assessment Items Site Locations + = Sensation present, - = Sensation absent, C = Callus, U = Ulcer R = Redness, W = Warmth, M = Maceration, PU = Pre-ulcerative lesion F = Fissure, S = Swelling, D = Dryness Assessment Right: Left: Other Deformity: No No Prior Foot Ulcer: No No Prior Amputation: No No Charcot Joint: No  No Ambulatory Status: Ambulatory Without Help Gait: Steady Electronic Signature(s) Signed: 06/07/2020 12:55:53 PM By: Elliot Gurney, BSN, RN, CWS, Kim RN, BSN Entered By: Elliot Gurney, BSN, RN, CWS, Kim on 06/07/2020 10:02:27 Dalal, Aiman ([id number]) -------------------------------------------------------------------------------- Nutrition Risk Screening Details Patient Name: [name] Date of Service: 06/07/2020 9:15 AM Medical Record Number: [id number] Patient Account Number: [id number] Date of Birth/Sex: 05/14/1961 (59 y.o. M) Treating RN: Huel Coventry Primary Care Norval Slaven: Roosvelt Maser Other Clinician: Referring Evianna Chandran: Roosvelt Maser Treating Tassie Pollett/Extender: Linwood Dibbles, HOYT Weeks in Treatment: 0 Height (in): 72 Weight (lbs): 249 Body Mass Index (BMI): 33.8 Nutrition Risk Screening Items Score Screening NUTRITION RISK SCREEN: I have an illness or condition that made me change the kind and/or amount of food I eat 0 No I eat fewer than two meals per day 0 No I eat few fruits and vegetables, or milk products 2 Yes I have three or more drinks of beer, liquor or wine almost every day 0 No I have tooth or mouth problems that make it hard for me to eat 0 No I don't always have enough money to buy the food I need 0 No I eat alone most of the time 0 No I take three or more different prescribed or over-the-counter drugs a day 0 No Without wanting to, I have lost or gained 10 pounds in the last six months 0 No I am not always physically able to shop, cook and/or feed myself 0 No Nutrition Protocols Good Risk Protocol Provide education on elevated Moderate Risk Protocol 0 blood sugars and impact on wound healing, as applicable High Risk Proctocol Risk Level: Good Risk Score: 2 Electronic Signature(s) Signed: 06/07/2020 12:55:53 PM By: Elliot Gurney, BSN, RN, CWS, Kim RN, BSN Entered By: Elliot Gurney, BSN, RN, CWS, Kim on 06/07/2020 10:01:06

## 2020-06-10 LAB — AEROBIC CULTURE W GRAM STAIN (SUPERFICIAL SPECIMEN)
Culture: NORMAL
Gram Stain: NONE SEEN

## 2020-06-14 ENCOUNTER — Encounter: Payer: Self-pay | Admitting: Ophthalmology

## 2020-06-16 ENCOUNTER — Encounter: Payer: BC Managed Care – PPO | Attending: Physician Assistant | Admitting: Physician Assistant

## 2020-06-16 ENCOUNTER — Other Ambulatory Visit: Payer: Self-pay

## 2020-06-16 DIAGNOSIS — L97524 Non-pressure chronic ulcer of other part of left foot with necrosis of bone: Secondary | ICD-10-CM | POA: Diagnosis not present

## 2020-06-16 DIAGNOSIS — L97512 Non-pressure chronic ulcer of other part of right foot with fat layer exposed: Secondary | ICD-10-CM | POA: Diagnosis not present

## 2020-06-16 DIAGNOSIS — E11621 Type 2 diabetes mellitus with foot ulcer: Secondary | ICD-10-CM | POA: Insufficient documentation

## 2020-06-16 DIAGNOSIS — L97528 Non-pressure chronic ulcer of other part of left foot with other specified severity: Secondary | ICD-10-CM | POA: Insufficient documentation

## 2020-06-16 DIAGNOSIS — L97522 Non-pressure chronic ulcer of other part of left foot with fat layer exposed: Secondary | ICD-10-CM | POA: Insufficient documentation

## 2020-06-16 DIAGNOSIS — E1140 Type 2 diabetes mellitus with diabetic neuropathy, unspecified: Secondary | ICD-10-CM | POA: Diagnosis not present

## 2020-06-16 NOTE — Progress Notes (Addendum)
[name] ([id number]) Visit Report for 06/16/2020 Arrival Information Details Patient Name: [name], [name] Date of Service: 06/16/2020 3:30 PM Medical Record Number: [id number] Patient Account Number: [id number] Date of Birth/Sex: 16-Nov-1961 (59 y.o. M) Treating RN: Huel Coventry Primary Care Mara Favero: Roosvelt Maser Other Clinician: Referring Lasandra Batley: Roosvelt Maser Treating Jaydence Vanyo/Extender: Linwood Dibbles, HOYT Weeks in Treatment: 1 Visit Information History Since Last Visit Added or deleted any medications: No Patient Arrived: Ambulatory Any new allergies or adverse reactions: No Arrival Time: 15:30 Had a fall or experienced change in No Accompanied By: self activities of daily living that may affect Transfer Assistance: None risk of falls: Patient Identification Verified: Yes Signs or symptoms of abuse/neglect since last visito No Secondary Verification Process Completed: Yes Hospitalized since last visit: No Patient Has Alerts: Yes Implantable device outside of the clinic excluding No Patient Alerts: Type II Diabetic cellular tissue based products placed in the center since last visit: Has Dressing in Place as Prescribed: Yes Pain Present Now: No Electronic Signature(s) Signed: 06/16/2020 5:43:28 PM By: Elliot Gurney, BSN, RN, CWS, Kim RN, BSN Entered By: Elliot Gurney, BSN, RN, CWS, Kim on 06/16/2020 15:30:35 [name], [name] ([id number]) -------------------------------------------------------------------------------- [name] Date of Service: 06/16/2020 3:30 PM Medical Record Number: [id number] Patient Account Number: [id number] Date of Birth/Sex: 02-Oct-1961 (59 y.o. M) Treating RN: Huel Coventry Primary Care [name]: Roosvelt Maser Other Clinician: Referring Serenah Mill: Roosvelt Maser Treating [name]/Extender: Linwood Dibbles, HOYT Weeks in Treatment: 1 Encounter Discharge Information Items Post Procedure Vitals Discharge Condition:  Stable Temperature (F): 98.0 Ambulatory Status: Ambulatory Pulse (bpm): 96 Discharge Destination: Home Respiratory Rate (breaths/min): 16 Transportation: Private Auto Blood Pressure (mmHg): 133/80 Accompanied By: self Schedule Follow-up Appointment: Yes Clinical Summary of Care: Electronic Signature(s) Signed: 06/16/2020 5:43:28 PM By: Elliot Gurney, BSN, RN, CWS, Kim RN, BSN Entered By: Elliot Gurney, BSN, RN, CWS, Kim on 06/16/2020 16:06:19 Tiedt, Keyston ([id number]) -------------------------------------------------------------------------------- Lower Extremity Assessment Details Patient Name: [name] Date of Service: 06/16/2020 3:30 PM Medical Record Number: [id number] Patient Account Number: [id number] Date of Birth/Sex: 1961/07/16 (59 y.o. M) Treating RN: Huel Coventry Primary Care [name]: Roosvelt Maser Other Clinician: Referring Lyam Provencio: Roosvelt Maser Treating Rotunda Worden/Extender: Linwood Dibbles, HOYT Weeks in Treatment: 1 Edema Assessment Assessed: [Left: No] [Right: No] Edema: [Left: No] [Right: No] Vascular Assessment Pulses: Dorsalis Pedis Palpable: [Left:Yes] [Right:Yes] Electronic Signature(s) Signed: 06/16/2020 5:43:28 PM By: Elliot Gurney, BSN, RN, CWS, Kim RN, BSN Entered By: Elliot Gurney, BSN, RN, CWS, Kim on 06/16/2020 15:39:17 Gunkel, Ryshawn ([id number]) -------------------------------------------------------------------------------- Multi Wound Chart Details Patient Name: [name] Date of Service: 06/16/2020 3:30 PM Medical Record Number: [id number] Patient Account Number: [id number] Date of Birth/Sex: Dec 07, 1961 (59 y.o. 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 Extremity Secondary Etiology: Diabetic Wound/Ulcer of the Lower N/A N/A Extremity Comorbid History: Cataracts, Type II Diabetes, Cataracts, Type II Diabetes, N/A Neuropathy Neuropathy Date Acquired: 05/17/2020 05/07/2020 05/17/2020 Weeks of Treatment: 1 1 1  Wound Status: Open Open Open Measurements L x W x D (cm) 0.5x0.5x0.4 1x1.4x0.2 2x2x0.3 Area (cm) : 0.196 1.1 3.142 Volume (cm) : 0.079 0.22 0.942 % Reduction in  Area: 16.90% 15.10% 90.50% % Reduction in Volume: -11.30% 15.10% 98.70% Starting Position 1 (o'clock): 12 Ending Position 1 (o'clock): 12 Maximum Distance 1 (cm): 0.2 Undermining: No Yes N/A Classification: Full Thickness Without Exposed Grade 1 Grade 4 Support Structures Exudate Amount: Medium Medium N/A Exudate Type: Serous Serous N/A Exudate Color: amber amber N/A Wound Margin: Flat and Intact Epibole N/A Granulation Amount: Medium (34-66%) None Present (0%) N/A Granulation Quality: Red N/A N/A Necrotic Amount: Medium (34-66%) Large (67-100%) N/A Exposed Structures: Fat Layer (Subcutaneous Tissue) Fat Layer (Subcutaneous Tissue) N/A Exposed: Yes Exposed: Yes Fascia: No Fascia: No Tendon: No Tendon: No Muscle: No Muscle: No Joint: No Joint: No Bone: No Bone: No Epithelialization: None None N/A Treatment Notes WILKERSON, Kemari ([id number]) Electronic Signature(s) Signed: 06/16/2020 5:43:28 PM By: Elliot Gurney, BSN, RN, CWS, Kim RN, BSN Entered By: Elliot Gurney, BSN, RN, CWS, Kim on 06/16/2020 15:44:19 [name], [name] ([id number]) -------------------------------------------------------------------------------- Multi-Disciplinary Care Plan Details Patient Name: [name] Date of Service: 06/16/2020  3:30 PM Medical Record Number: [id number] Patient Account Number: [id number] Date of Birth/Sex: 1961-11-17 (59 y.o. M) Treating RN: Huel Coventry Primary Care Jerald Hennington: Roosvelt Maser Other Clinician: Referring Jeannemarie Sawaya: Roosvelt Maser Treating Kanoe Wanner/Extender: Linwood Dibbles, HOYT Weeks in Treatment: 1 Active Inactive Necrotic Tissue Nursing Diagnoses: Impaired tissue integrity related to necrotic/devitalized tissue Goals: Necrotic/devitalized tissue will be minimized in the wound bed Date Initiated: 06/07/2020 Target Resolution Date: 07/06/2020 Goal Status: Active Interventions: Assess patient pain level pre-, during and post procedure and prior to discharge Treatment Activities: Excisional debridement : 06/07/2020 Notes: Orientation to the Wound Care Program Nursing Diagnoses: Knowledge deficit related to the wound healing center program Goals: Patient/caregiver will verbalize understanding of the Wound Healing Center Program Date Initiated: 06/07/2020 Target Resolution Date: 07/06/2020 Goal Status: Active Interventions: Provide education on orientation to the wound center Notes: Osteomyelitis Nursing Diagnoses: Potential for infection: osteomyelitis Goals: Diagnostic evaluation for osteomyelitis completed as ordered Date Initiated: 06/07/2020 Target Resolution Date: 07/06/2020 Goal Status: Active Patient/caregiver will verbalize understanding of disease process and disease management Date Initiated: 06/07/2020 Target Resolution Date: 07/06/2020 Goal Status: Active Patient's osteomyelitis will resolve Date Initiated: 06/07/2020 Target Resolution Date: 07/06/2020 Goal Status: Active Signs and symptoms for osteomyelitis will be recognized and promptly addressed Date Initiated: 06/07/2020 Target Resolution Date: 07/06/2020 Goal Status: Active Interventions: Assess for signs and symptoms of osteomyelitis resolution every visit Schuchart, Chick ([id number]) Provide education on  osteomyelitis Notes: Peripheral Neuropathy Nursing Diagnoses: Knowledge deficit related to disease process and management of peripheral neurovascular dysfunction Goals: Patient/caregiver will verbalize understanding of disease process and disease management Date Initiated: 06/07/2020 Target Resolution Date: 07/06/2020 Goal Status: Active Interventions: Assess signs and symptoms of neuropathy upon admission and as needed Provide education on Management of Neuropathy and Related Ulcers Notes: Pressure Nursing Diagnoses: Knowledge deficit related to management of pressures ulcers Goals: Patient will remain free from development of additional pressure ulcers Date Initiated: 06/07/2020 Target Resolution Date: 07/06/2020 Goal Status: Active Patient will remain free of pressure ulcers Date Initiated: 06/07/2020 Target Resolution Date: 07/06/2020 Goal Status: Active Interventions: Assess: immobility, friction, shearing, incontinence upon admission and as needed Notes: Soft Tissue Infection Nursing Diagnoses: Impaired tissue integrity Goals: Patient/caregiver will verbalize understanding of or measures to prevent infection and contamination in the home setting Date Initiated: 06/07/2020 Target Resolution Date: 07/06/2020 Goal Status: Active Interventions: Assess signs and symptoms of infection every visit Treatment Activities: Culture and sensitivity : 06/07/2020 Notes: Wound/Skin Impairment Nursing Diagnoses: Impaired tissue integrity Goals: Patient/caregiver will verbalize understanding of skin care regimen Date  Initiated: 06/07/2020 Target Resolution Date: 07/06/2020 Goal Status: Active Ulcer/skin breakdown will have a volume reduction of 30% by week 4 Date Initiated: 06/07/2020 Target Resolution Date: 07/06/2020 Goal Status: Active Saner, Kailen ([id number]) Interventions: Assess patient/caregiver ability to obtain necessary supplies Assess ulceration(s) every visit Treatment  Activities: Referred to DME [facility] for dressing supplies : 06/07/2020 Notes: Electronic Signature(s) Signed: 06/16/2020 5:43:28 PM By: Elliot GurneyWoody, BSN, RN, CWS, Kim RN, BSN Entered By: Elliot GurneyWoody, BSN, RN, CWS, Kim on 06/16/2020 15:44:08 Hamill, Moustafa ([id number]) -------------------------------------------------------------------------------- Pain Assessment Details Patient Name: [name] LenzNORRIS, Kiyan Date of Service: 06/16/2020 3:30 PM Medical Record Number: [id number] Patient Account Number: [id number] Date of Birth/Sex: 09/13/1961 (59 y.o. M) Treating RN: Huel CoventryWoody, Kim Primary Care Lakyn Alsteen: Roosvelt MaserLANE, RACHEL Other Clinician: Referring Jarris Kortz: Roosvelt MaserLANE, RACHEL Treating Onyinyechi Huante/Extender: Linwood DibblesSTONE III, HOYT Weeks in Treatment: 1 Active Problems Location of Pain Severity and Description of Pain Patient Has Paino No Site Locations Pain Management and Medication Current Pain Management: Notes Patient denies pain at this time. Electronic Signature(s) Signed: 06/16/2020 5:43:28 PM By: Elliot GurneyWoody, BSN, RN, CWS, Kim RN, BSN Entered By: Elliot GurneyWoody, BSN, RN, CWS, Kim on 06/16/2020 15:31:29 Caserta, Jeriel ([id number]) -------------------------------------------------------------------------------- Patient/Caregiver Education Details Patient Name: [name] LenzNORRIS, Spyros Date of Service: 06/16/2020 3:30 PM Medical Record Number: [id number] Patient Account Number: [id number] Date of Birth/Gender: 01/07/1961 (59 y.o. M) Treating RN: Huel CoventryWoody, Kim Primary Care Physician: Roosvelt MaserLANE, RACHEL Other Clinician: Referring Physician: Roosvelt MaserLANE, RACHEL Treating Physician/Extender: Skeet SimmerSTONE III, HOYT Weeks in Treatment: 1 Education Assessment Education Provided To: Patient Education Topics Provided Wound/Skin Impairment: Handouts: Caring for Your Ulcer Methods: Demonstration, Explain/Verbal Responses: State content correctly Electronic Signature(s) Signed: 06/16/2020 5:43:28 PM By: Elliot GurneyWoody, BSN, RN, CWS, Kim RN, BSN Entered By: Elliot GurneyWoody, BSN, RN, CWS, Kim on  06/16/2020 16:02:09 Knutson, Raywood ([id number]) -------------------------------------------------------------------------------- Wound Assessment Details Patient Name: [name] LenzNORRIS, Octavia Date of Service: 06/16/2020 3:30 PM Medical Record Number: [id number] Patient Account Number: [id number] Date of Birth/Sex: 12/17/1960 (59 y.o. M) Treating RN: Huel CoventryWoody, Kim Primary Care Iley Breeden: Roosvelt MaserLANE, RACHEL Other Clinician: Referring Lakasha Mcfall: Roosvelt MaserLANE, RACHEL Treating Effrey Davidow/Extender: Linwood DibblesSTONE III, HOYT Weeks in Treatment: 1 Wound Status Wound Number: 1 Primary Etiology: Trauma, Other Wound Location: Right, Plantar Foot Secondary Etiology: Diabetic Wound/Ulcer of the Lower Extremity Wounding Event: Trauma Wound Status: Open Date Acquired: 05/17/2020 Comorbid History: Cataracts, Type II Diabetes, Neuropathy Weeks Of Treatment: 1 Clustered Wound: No Photos Photo Uploaded By: Elliot GurneyWoody, BSN, RN, CWS, Kim on 06/16/2020 15:40:01 Wound Measurements Length: (cm) 0.5 Width: (cm) 0.5 Depth: (cm) 0.4 Area: (cm) 0.196 Volume: (cm) 0.079 % Reduction in Area: 16.9% % Reduction in Volume: -11.3% Epithelialization: None Tunneling: No Undermining: No Wound Description Classification: Full Thickness Without Exposed Support Structu Wound Margin: Flat and Intact Exudate Amount: Medium Exudate Type: Serous Exudate Color: amber res Foul Odor After Cleansing: No Slough/Fibrino Yes Wound Bed Granulation Amount: Medium (34-66%) Exposed Structure Granulation Quality: Red Fascia Exposed: No Necrotic Amount: Medium (34-66%) Fat Layer (Subcutaneous Tissue) Exposed: Yes Necrotic Quality: Adherent Slough Tendon Exposed: No Muscle Exposed: No Joint Exposed: No Bone Exposed: No Treatment Notes Wound #1 (Right, Plantar Foot) Notes scell, gauze, conform Electronic Signature(s) Ricciardelli, Avner ([id number]) Signed: 06/16/2020 5:43:28 PM By: Elliot GurneyWoody, BSN, RN, CWS, Kim RN, BSN Entered By: Elliot GurneyWoody, BSN, RN, CWS, Kim on 06/16/2020  15:37:25 Macgowan, Jhoan ([id number]) -------------------------------------------------------------------------------- Wound Assessment Details Patient Name: [name] LenzNORRIS, Mazi Date of Service: 06/16/2020 3:30 PM Medical Record Number: [id number] Patient Account Number: [id number] Date of Birth/Sex: 11/14/1961 (59 y.o. M) Treating RN: Huel CoventryWoody, Kim Primary Care Jaislyn Blinn: Roosvelt MaserLANE, RACHEL Other Clinician: Referring Almedia Cordell: Roosvelt MaserLANE, RACHEL  Treating Juda Toepfer/Extender: [name], HOYT Weeks in Treatment: 1 Wound Status Wound Number: 2 Primary Etiology: Diabetic Wound/Ulcer of the Lower Extremity Wound Location: Left, Anterior Toe Great Wound Status: Open Wounding Event: Gradually Appeared Comorbid History: Cataracts, Type II Diabetes, Neuropathy Date Acquired: 05/07/2020 Weeks Of Treatment: 1 Clustered Wound: No Photos Photo Uploaded By: Elliot Gurney, BSN, RN, CWS, Kim on 06/16/2020 15:40:02 Wound Measurements Length: (cm) 1 Width: (cm) 1.4 Depth: (cm) 0.2 Area: (cm) 1.1 Volume: (cm) 0.22 % Reduction in Area: 15.1% % Reduction in Volume: 15.1% Epithelialization: None Undermining: Yes Starting Position (o'clock): 12 Ending Position (o'clock): 12 Maximum Distance: (cm) 0.2 Wound Description Classification: Grade 1 Wound Margin: Epibole Exudate Amount: Medium Exudate Type: Serous Exudate Color: amber Foul Odor After Cleansing: No Slough/Fibrino Yes Wound Bed Granulation Amount: None Present (0%) Exposed Structure Necrotic Amount: Large (67-100%) Fascia Exposed: No Necrotic Quality: Adherent Slough Fat Layer (Subcutaneous Tissue) Exposed: Yes Tendon Exposed: No Muscle Exposed: No Joint Exposed: No Bone Exposed: No Treatment Notes Wound #2 (Left, Anterior Toe Great) Notes Rockholt, Mizael ([id number]) scell, gauze, conform Electronic Signature(s) Signed: 06/16/2020 5:43:28 PM By: Elliot Gurney, BSN, RN, CWS, Kim RN, BSN Entered By: Elliot Gurney, BSN, RN, CWS, Kim on 06/16/2020 15:38:08 Koziel, Nayden  ([id number]) -------------------------------------------------------------------------------- Wound Assessment Details Patient Name: [name] Date of Service: 06/16/2020 3:30 PM Medical Record Number: [id number] Patient Account Number: [id number] Date of Birth/Sex: 07-15-1961 (59 y.o. M) Treating RN: Huel Coventry Primary Care Erikka Follmer: Roosvelt Maser Other Clinician: Referring Anishka Bushard: Roosvelt Maser Treating Parveen Freehling/Extender: Linwood Dibbles, HOYT Weeks in Treatment: 1 Wound Status Wound Number: 3 Primary Etiology: Diabetic Wound/Ulcer of the Lower Extremity Wound Location: Left, Circumferential Toe Second Wound Status: Open Wounding Event: Gradually Appeared Date Acquired: 05/17/2020 Weeks Of Treatment: 1 Clustered Wound: No Photos Photo Uploaded By: Elliot Gurney, BSN, RN, CWS, Kim on 06/16/2020 15:40:26 Wound Measurements Length: (cm) 2 Width: (cm) 2 Depth: (cm) 0.3 Area: (cm) 3.142 Volume: (cm) 0.942 % Reduction in Area: 90.5% % Reduction in Volume: 98.7% Wound Description Classification: Grade 4 Treatment Notes Wound #3 (Left, Circumferential Toe Second) Notes scell, gauze, conform Electronic Signature(s) Signed: 06/16/2020 5:43:28 PM By: Elliot Gurney, BSN, RN, CWS, Kim RN, BSN Entered By: Elliot Gurney, BSN, RN, CWS, Kim on 06/16/2020 15:38:57 Mester, Zoran ([id number]) -------------------------------------------------------------------------------- Vitals Details Patient Name: [name] Date of Service: 06/16/2020 3:30 PM Medical Record Number: [id number] Patient Account Number: [id number] Date of Birth/Sex: 1961-07-02 (59 y.o. M) Treating RN: Huel Coventry Primary Care Alyne Martinson: Roosvelt Maser Other Clinician: Referring Jairy Angulo: Roosvelt Maser Treating Naleah Kofoed/Extender: Linwood Dibbles, HOYT Weeks in Treatment: 1 Vital Signs Time Taken: 15:30 Temperature (F): 98.0 Height (in): 72 Pulse (bpm): 96 Weight (lbs): 249 Respiratory Rate (breaths/min): 16 Body Mass Index (BMI): 33.8 Blood  Pressure (mmHg): 133/80 Reference Range: 80 - 120 mg / dl Electronic Signature(s) Signed: 06/16/2020 5:43:28 PM By: Elliot Gurney, BSN, RN, CWS, Kim RN, BSN Entered By: Elliot Gurney, BSN, RN, CWS, Kim on 06/16/2020 15:30:55

## 2020-06-16 NOTE — Progress Notes (Addendum)
[name], Elisha ([id number]) Visit Report for 06/16/2020 Chief Complaint Document Details Patient Name: [name], Vicki Date of Service: 06/16/2020 3:30 PM Medical Record Number: [id number] Patient Account Number: [id number] Date of Birth/Sex: 03/08/1961 (59 y.o. M) Treating RN: Rodell PernaScott, Dajea Primary Care Provider: Roosvelt MaserLANE, RACHEL Other Clinician: Referring Provider: Roosvelt MaserLANE, RACHEL Treating Provider/Extender: Linwood DibblesSTONE III, Itxel Wickard Weeks in Treatment: 1 Information Obtained from: Patient Chief Complaint Left foot/toe ulcers Electronic Signature(s) Signed: 06/16/2020 3:29:32 PM By: Lenda KelpStone III, Issa Luster PA-C Entered By: Lenda KelpStone III, Linzy Darling on 06/16/2020 15:29:31 Hazelrigg, Amine ([id number]) -------------------------------------------------------------------------------- Debridement Details Patient Name: [name] LenzNORRIS, Torre Date of Service: 06/16/2020 3:30 PM Medical Record Number: [id number] Patient Account Number: [id number] Date of Birth/Sex: 01/02/1961 (59 y.o. M) Treating RN: Huel CoventryWoody, Kim Primary Care Provider: Roosvelt MaserLANE, RACHEL Other Clinician: Referring Provider: Roosvelt MaserLANE, RACHEL Treating Provider/Extender: Linwood DibblesSTONE III, Beckey Polkowski Weeks in Treatment: 1 Debridement Performed for Wound #2 Left,Anterior Toe Great Assessment: Performed By: Physician [name], Lawrnce Reyez E., PA-C Debridement Type: Debridement Severity of Tissue Pre Debridement: Fat layer exposed Level of Consciousness (Pre- Awake and Alert procedure): Pre-procedure Verification/Time Out Yes - 15:46 Taken: Start Time: 15:46 Pain Control: Lidocaine Total Area Debrided (L x W): 1 (cm) x 1.4 (cm) = 1.4 (cm) Tissue and other material Viable, Non-Viable, Callus, Slough, Subcutaneous, Slough debrided: Level: Skin/Subcutaneous Tissue Debridement Description: Excisional Instrument: Curette Bleeding: Minimum Hemostasis Achieved: Pressure End Time: 15:47 Response to Treatment: Procedure was tolerated well Level of Consciousness (Post- Awake and Alert procedure): Post  Debridement Measurements of Total Wound Length: (cm) 1 Width: (cm) 1.4 Depth: (cm) 0.2 Volume: (cm) 0.22 Character of Wound/Ulcer Post Debridement: Stable Severity of Tissue Post Debridement: Fat layer exposed Post Procedure Diagnosis Same as Pre-procedure Electronic Signature(s) Signed: 06/16/2020 4:55:37 PM By: Lenda KelpStone III, Kizzie Cotten PA-C Signed: 06/16/2020 5:43:28 PM By: Elliot GurneyWoody, BSN, RN, CWS, Kim RN, BSN Entered By: Elliot GurneyWoody, BSN, RN, CWS, Kim on 06/16/2020 15:47:31 Jolly, Thaddius ([id number]) -------------------------------------------------------------------------------- Debridement Details Patient Name: [name] LenzNORRIS, Tod Date of Service: 06/16/2020 3:30 PM Medical Record Number: [id number] Patient Account Number: [id number] Date of Birth/Sex: 11/13/1961 (59 y.o. M) Treating RN: Huel CoventryWoody, Kim Primary Care Provider: Roosvelt MaserLANE, RACHEL Other Clinician: Referring Provider: Roosvelt MaserLANE, RACHEL Treating Provider/Extender: Linwood DibblesSTONE III, Effie Janoski Weeks in Treatment: 1 Debridement Performed for Wound #3 Left,Circumferential Toe Second Assessment: Performed By: Physician [name], Jazilyn Siegenthaler E., PA-C Debridement Type: Debridement Severity of Tissue Pre Debridement: Fat layer exposed Level of Consciousness (Pre- Awake and Alert procedure): Pre-procedure Verification/Time Out Yes - 15:46 Taken: Start Time: 15:46 Pain Control: Lidocaine Total Area Debrided (L x W): 2 (cm) x 2 (cm) = 4 (cm) Tissue and other material Viable, Non-Viable, Bone, Callus, Slough, Subcutaneous, Slough debrided: Level: Skin/Subcutaneous Tissue/Muscle/Bone Debridement Description: Excisional Instrument: Curette Bleeding: Minimum Hemostasis Achieved: Pressure End Time: 15:47 Response to Treatment: Procedure was tolerated well Level of Consciousness (Post- Awake and Alert procedure): Post Debridement Measurements of Total Wound Length: (cm) 2 Width: (cm) 2 Depth: (cm) 0.3 Volume: (cm) 0.942 Character of Wound/Ulcer Post Debridement:  Stable Severity of Tissue Post Debridement: Necrosis of bone Post Procedure Diagnosis Same as Pre-procedure Electronic Signature(s) Signed: 06/16/2020 4:55:37 PM By: Lenda KelpStone III, Reganne Messerschmidt PA-C Signed: 06/16/2020 5:43:28 PM By: Elliot GurneyWoody, BSN, RN, CWS, Kim RN, BSN Entered By: Elliot GurneyWoody, BSN, RN, CWS, Kim on 06/16/2020 15:54:16 Durfee, Sayge ([id number]) -------------------------------------------------------------------------------- Debridement Details Patient Name: [name] LenzNORRIS, Hawken Date of Service: 06/16/2020 3:30 PM Medical Record Number: [id number] Patient Account Number: [id number] Date of Birth/Sex: 06/18/1961 (59 y.o. M) Treating RN: Huel CoventryWoody, Kim Primary Care Provider: Roosvelt MaserLANE, RACHEL Other Clinician: Referring Provider: Roosvelt MaserLANE, RACHEL Treating  Provider/Extender: Linwood Dibbles, Brandyce Dimario Weeks in Treatment: 1 Debridement Performed for Wound #1 Right,Plantar Foot Assessment: Performed By: Physician [name], Seleen Walter E., PA-C Debridement Type: Debridement Severity of Tissue Pre Debridement: Fat layer exposed Level of Consciousness (Pre- Awake and Alert procedure): Pre-procedure Verification/Time Out Yes - 15:46 Taken: Start Time: 15:46 Pain Control: Lidocaine Total Area Debrided (L x W): 0.5 (cm) x 0.5 (cm) = 0.25 (cm) Tissue and other material Viable, Non-Viable, Callus, Slough, Subcutaneous, Slough debrided: Level: Skin/Subcutaneous Tissue Debridement Description: Excisional Instrument: Curette Bleeding: Minimum Hemostasis Achieved: Pressure End Time: 15:47 Response to Treatment: Procedure was tolerated well Level of Consciousness (Post- Awake and Alert procedure): Post Debridement Measurements of Total Wound Length: (cm) 0.5 Width: (cm) 0.5 Depth: (cm) 0.4 Volume: (cm) 0.079 Character of Wound/Ulcer Post Debridement: Stable Severity of Tissue Post Debridement: Fat layer exposed Post Procedure Diagnosis Same as Pre-procedure Electronic Signature(s) Signed: 06/16/2020 4:55:37 PM By: Lenda Kelp PA-C Signed: 06/16/2020 5:43:28 PM By: Elliot Gurney, BSN, RN, CWS, Kim RN, BSN Entered By: Elliot Gurney, BSN, RN, CWS, Kim on 06/16/2020 16:00:07 Monsivais, River ([id number]) -------------------------------------------------------------------------------- HPI Details Patient Name: [name] Lenz Date of Service: 06/16/2020 3:30 PM Medical Record Number: [id number] Patient Account Number: [id number] Date of Birth/Sex: [birth year] (59 y.o. M) Treating RN: Rodell Perna Primary Care Provider: Roosvelt Maser Other Clinician: Referring Provider: Roosvelt Maser Treating Provider/Extender: Linwood Dibbles, Devrin Monforte Weeks in Treatment: 1 History of Present Illness HPI Description: 06/10/2020 upon evaluation today patient appears to be doing poorly in regard to his right plantar foot, left great toe, and left second toe. Unfortunately he has significant wounds at these locations but especially in regard to the second toe which does have a lot of necrotic tissue over the distal portion of the toe. The great toe does have some necrotic tissue as well. With that being said the patient tells me that this has been going on for 1-2 months and seems to be getting worse especially in regard to the toe. He has not had any specific x-rays to identify obvious signs of osteomyelitis up to this point that something would likely get a different today as well. With that being said he also has not had any current arterial studies which I think is something else that we probably will look into performing at this point. He does have a history of diabetes mellitus type 2, and diabetic neuropathy. Currently he has been using normal shoes for ambulation though he does have some kind of external device so that he does not have to have actual still toed boots anymore at work which he states has helped nonetheless we is at work he does have to have something that protects his feet as such per policy. He tells me he is having to do a lot more walking right  now compared to normal but he can try to see if he can change something in that regard. 06/16/2020 upon evaluation today patient appears to be doing well all things considered with regard to his wounds. I do not feel like anything is it any worse and overall I feel like the Bactrim has done well. The culture that I reviewed did not reveal any specific organisms unfortunately that would help tailor treatment. I did actually review the x-ray as well it does appear that he has evidence of osteomyelitis of the distal tuft of the second toe left foot. That was discussed with him today. He likely will need a referral to infectious disease. Electronic Signature(s) Signed: 06/16/2020 4:36:50 PM  By: Lenda Kelp PA-C Entered By: Lenda Kelp on 06/16/2020 16:36:50 Lazare, Skylor ([id number]) -------------------------------------------------------------------------------- Physical Exam Details Patient Name: [name] Lenz Date of Service: 06/16/2020 3:30 PM Medical Record Number: [id number] Patient Account Number: [id number] Date of Birth/Sex: 1961-11-09 (59 y.o. M) Treating RN: Rodell Perna Primary Care Provider: Roosvelt Maser Other Clinician: Referring Provider: Roosvelt Maser Treating Provider/Extender: [name], Randee Huston Weeks in Treatment: 1 Constitutional Well-nourished and well-hydrated in no acute distress. Respiratory normal breathing without difficulty. Psychiatric this patient is able to make decisions and demonstrates good insight into disease process. Alert and Oriented x 3. pleasant and cooperative. Notes Upon inspection patient's wound bed actually showed signs of good granulation at this time. There does not appear to be any evidence of active infection and overall very pleased with where things stand. I did perform debridement at all wound locations I was able to remove callus and slough from most locations on the second toe actually did remove some necrotic bone in the distal portion of  the toe as well which the patient tolerated without any pain. I do believe taking some silver alginate to this area will be beneficial for him. Electronic Signature(s) Signed: 06/16/2020 4:37:27 PM By: Lenda Kelp PA-C Entered By: Lenda Kelp on 06/16/2020 16:37:27 Salomone, Abednego ([id number]) -------------------------------------------------------------------------------- Physician Orders Details Patient Name: [name] Lenz Date of Service: 06/16/2020 3:30 PM Medical Record Number: [id number] Patient Account Number: [id number] Date of Birth/Sex: Jun 25, 1961 (59 y.o. M) Treating RN: Huel Coventry Primary Care Provider: Roosvelt Maser Other Clinician: Referring Provider: Roosvelt Maser Treating Provider/Extender: Linwood Dibbles, Darlynn Ricco Weeks in Treatment: 1 Verbal / Phone Orders: No Diagnosis Coding ICD-10 Coding Code Description E11.621 Type 2 diabetes mellitus with foot ulcer 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.528 Non-pressure chronic ulcer of other part of left foot with other specified severity E11.40 Type 2 diabetes mellitus with diabetic neuropathy, unspecified Wound Cleansing Wound #1 Right,Plantar Foot o Clean wound with Normal Saline. Primary Wound Dressing Wound #1 Right,Plantar Foot o Silver Alginate Wound #2 Left,Anterior Toe Great o Silver Alginate Wound #3 Left,Circumferential Toe Second o Silver Alginate Secondary Dressing Wound #1 Right,Plantar Foot o Gauze and Kerlix/Conform Wound #2 Left,Anterior Toe Great o Gauze and Kerlix/Conform Wound #3 Left,Circumferential Toe Second o Gauze and Kerlix/Conform Dressing Change Frequency Wound #1 Right,Plantar Foot o Change Dressing Monday, Wednesday, Friday Wound #2 Left,Anterior Toe Great o Change Dressing Monday, Wednesday, Friday Wound #3 Left,Circumferential Toe Second o Change Dressing Monday, Wednesday, Friday Follow-up Appointments Wound #1  Right,Plantar Foot o Return Appointment in 1 week. Wound #2 Left,Anterior Toe Great o Return Appointment in 1 week. Wound #3 Left,Circumferential Toe Second o Return Appointment in 1 week. Edema Control Wound #1 Right,Plantar Foot o Elevate legs to the level of the heart and pump ankles as often as possible Hutcherson, Vipul ([id number]) Wound #2 Left,Anterior Toe Great o Elevate legs to the level of the heart and pump ankles as often as possible Wound #3 Left,Circumferential Toe Second o Elevate legs to the level of the heart and pump ankles as often as possible Off-Loading Wound #1 Right,Plantar Foot o Open toe surgical shoe Wound #2 Left,Anterior Toe Great o Open toe surgical shoe Wound #3 Left,Circumferential Toe Second o Open toe surgical shoe Additional Orders / Instructions Wound #1 Right,Plantar Foot o Increase protein intake. Wound #2 Left,Anterior Toe Great o Increase protein intake. Wound #3 Left,Circumferential Toe Second o Increase protein intake. Medications-please add to medication list. Wound #  1 Right,Plantar Foot o P.O. Antibiotics Wound #2 Left,Anterior Toe Great o P.O. Antibiotics Wound #3 Left,Circumferential Toe Second o P.O. Antibiotics Consults o Infectious Disease - [facility] Patient Medications Allergies: amoxicillin Notifications Medication Indication Start End Bactrim DS 06/16/2020 DOSE 1 - oral 800 mg-160 mg tablet - 1 tablet oral taken 2 times per day for 14 additional days to add on to the current regimen Electronic Signature(s) Signed: 06/16/2020 4:38:52 PM By: Lenda Kelp PA-C Entered By: Lenda Kelp on 06/16/2020 16:38:51 Seney, Diontae ([id number]) -------------------------------------------------------------------------------- Problem List Details Patient Name: [name] Lenz Date of Service: 06/16/2020 3:30 PM Medical Record Number: [id number] Patient Account Number: [id number] Date of Birth/Sex: 1961-09-02  (59 y.o. M) Treating RN: Rodell Perna Primary Care Provider: Roosvelt Maser Other Clinician: Referring Provider: Roosvelt Maser Treating Provider/Extender: Linwood Dibbles, Kadey Mihalic Weeks in Treatment: 1 Active Problems ICD-10 Encounter Code Description Active Date MDM Diagnosis E11.621 Type 2 diabetes mellitus with foot ulcer 06/07/2020 No Yes L97.522 Non-pressure chronic ulcer of other part of left foot with fat layer 06/07/2020 No Yes exposed L97.528 Non-pressure chronic ulcer of other part of left foot with other specified 06/07/2020 No Yes severity E11.40 Type 2 diabetes mellitus with diabetic neuropathy, unspecified 06/07/2020 No Yes Inactive Problems Resolved Problems Electronic Signature(s) Signed: 06/16/2020 3:29:21 PM By: Lenda Kelp PA-C Entered By: Lenda Kelp on 06/16/2020 15:29:21 Callicott, Heith ([id number]) -------------------------------------------------------------------------------- Progress Note Details Patient Name: [name] Lenz Date of Service: 06/16/2020 3:30 PM Medical Record Number: [id number] Patient Account Number: [id number] Date of Birth/Sex: 1961-06-03 (59 y.o. M) Treating RN: Rodell Perna Primary Care Provider: Roosvelt Maser Other Clinician: Referring Provider: Roosvelt Maser Treating Provider/Extender: Linwood Dibbles, Jaslen Adcox Weeks in Treatment: 1 Subjective Chief Complaint Information obtained from Patient Left foot/toe ulcers History of Present Illness (HPI) 06/10/2020 upon evaluation today patient appears to be doing poorly in regard to his right plantar foot, left great toe, and left second toe. Unfortunately he has significant wounds at these locations but especially in regard to the second toe which does have a lot of necrotic tissue over the distal portion of the toe. The great toe does have some necrotic tissue as well. With that being said the patient tells me that this has been going on for 1-2 months and seems to be getting worse especially in regard to the  toe. He has not had any specific x-rays to identify obvious signs of osteomyelitis up to this point that something would likely get a different today as well. With that being said he also has not had any current arterial studies which I think is something else that we probably will look into performing at this point. He does have a history of diabetes mellitus type 2, and diabetic neuropathy. Currently he has been using normal shoes for ambulation though he does have some kind of external device so that he does not have to have actual still toed boots anymore at work which he states has helped nonetheless we is at work he does have to have something that protects his feet as such per policy. He tells me he is having to do a lot more walking right now compared to normal but he can try to see if he can change something in that regard. 06/16/2020 upon evaluation today patient appears to be doing well all things considered with regard to his wounds. I do not feel like anything is it any worse and overall I feel like the Bactrim has done well. The culture that I reviewed  did not reveal any specific organisms unfortunately that would help tailor treatment. I did actually review the x-ray as well it does appear that he has evidence of osteomyelitis of the distal tuft of the second toe left foot. That was discussed with him today. He likely will need a referral to infectious disease. Objective Constitutional Well-nourished and well-hydrated in no acute distress. Vitals Time Taken: 3:30 PM, Height: 72 in, Weight: 249 lbs, BMI: 33.8, Temperature: 98.0 F, Pulse: 96 bpm, Respiratory Rate: 16 breaths/min, Blood Pressure: 133/80 mmHg. Respiratory normal breathing without difficulty. Psychiatric this patient is able to make decisions and demonstrates good insight into disease process. Alert and Oriented x 3. pleasant and cooperative. General Notes: Upon inspection patient's wound bed actually showed signs of  good granulation at this time. There does not appear to be any evidence of active infection and overall very pleased with where things stand. I did perform debridement at all wound locations I was able to remove callus and slough from most locations on the second toe actually did remove some necrotic bone in the distal portion of the toe as well which the patient tolerated without any pain. I do believe taking some silver alginate to this area will be beneficial for him. Integumentary (Hair, Skin) Wound #1 status is Open. Original cause of wound was Trauma. The wound is located on the Right,Plantar Foot. The wound measures 0.5cm length x 0.5cm width x 0.4cm depth; 0.196cm^2 area and 0.079cm^3 volume. There is Fat Layer (Subcutaneous Tissue) Exposed exposed. There is no tunneling or undermining noted. There is a medium amount of serous drainage noted. The wound margin is flat and intact. There is medium (34-66%) red granulation within the wound bed. There is a medium (34-66%) amount of necrotic tissue within the wound bed including Adherent Slough. Wound #2 status is Open. Original cause of wound was Gradually Appeared. The wound is located on the [employer]. The wound measures 1cm length x 1.4cm width x 0.2cm depth; 1.1cm^2 area and 0.22cm^3 volume. There is Fat Layer (Subcutaneous Tissue) Exposed exposed. There is undermining starting at 12:00 and ending at 12:00 with a maximum distance of 0.2cm. There is a medium amount of serous drainage noted. The wound margin is epibole. There is no granulation within the wound bed. There is a large (67-100%) amount of necrotic tissue within the wound bed including Adherent Slough. Diodato, Millie ([id number]) Wound #3 status is Open. Original cause of wound was Gradually Appeared. The wound is located on the Left,Circumferential Toe Second. The wound measures 2cm length x 2cm width x 0.3cm depth; 3.142cm^2 area and 0.942cm^3  volume. Assessment Active Problems ICD-10 Type 2 diabetes mellitus with foot ulcer Non-pressure chronic ulcer of other part of left foot with fat layer exposed Non-pressure chronic ulcer of other part of left foot with other specified severity Type 2 diabetes mellitus with diabetic neuropathy, unspecified Procedures Wound #1 Pre-procedure diagnosis of Wound #1 is a Trauma, Other located on the Right,Plantar Foot .Severity of Tissue Pre Debridement is: Fat layer exposed. There was a Excisional Skin/Subcutaneous Tissue Debridement with a total area of 0.25 sq cm performed by [name], Jerek Meulemans E., PA-C. With the following instrument(s): Curette to remove Viable and Non-Viable tissue/material. Material removed includes Callus, Subcutaneous Tissue, and Slough after achieving pain control using Lidocaine. A time out was conducted at 15:46, prior to the start of the procedure. A Minimum amount of bleeding was controlled with Pressure. The procedure was tolerated well. Post Debridement Measurements: 0.5cm length  x 0.5cm width x 0.4cm depth; 0.079cm^3 volume. Character of Wound/Ulcer Post Debridement is stable. Severity of Tissue Post Debridement is: Fat layer exposed. Post procedure Diagnosis Wound #1: Same as Pre-Procedure Wound #2 Pre-procedure diagnosis of Wound #2 is a Diabetic Wound/Ulcer of the Lower Extremity located on the Left,Anterior Toe Great .Severity of Tissue Pre Debridement is: Fat layer exposed. There was a Excisional Skin/Subcutaneous Tissue Debridement with a total area of 1.4 sq cm performed by [name], Laquetta Racey E., PA-C. With the following instrument(s): Curette to remove Viable and Non-Viable tissue/material. Material removed includes Callus, Subcutaneous Tissue, and Slough after achieving pain control using Lidocaine. A time out was conducted at 15:46, prior to the start of the procedure. A Minimum amount of bleeding was controlled with Pressure. The procedure was tolerated well.  Post Debridement Measurements: 1cm length x 1.4cm width x 0.2cm depth; 0.22cm^3 volume. Character of Wound/Ulcer Post Debridement is stable. Severity of Tissue Post Debridement is: Fat layer exposed. Post procedure Diagnosis Wound #2: Same as Pre-Procedure Wound #3 Pre-procedure diagnosis of Wound #3 is a Diabetic Wound/Ulcer of the Lower Extremity located on the Left,Circumferential Toe Second .Severity of Tissue Pre Debridement is: Fat layer exposed. There was a Excisional Skin/Subcutaneous Tissue/Muscle/Bone Debridement with a total area of 4 sq cm performed by [name], Sarajane Fambrough E., PA-C. With the following instrument(s): Curette to remove Viable and Non-Viable tissue/material. Material removed includes Bone,Callus, Subcutaneous Tissue, and Slough after achieving pain control using Lidocaine. A time out was conducted at 15:46, prior to the start of the procedure. A Minimum amount of bleeding was controlled with Pressure. The procedure was tolerated well. Post Debridement Measurements: 2cm length x 2cm width x 0.3cm depth; 0.942cm^3 volume. Character of Wound/Ulcer Post Debridement is stable. Severity of Tissue Post Debridement is: Necrosis of bone. Post procedure Diagnosis Wound #3: Same as Pre-Procedure Plan Wound Cleansing: Wound #1 Right,Plantar Foot: Clean wound with Normal Saline. Primary Wound Dressing: Wound #1 Right,Plantar Foot: Silver Alginate Wound #2 Left,Anterior Toe Great: Silver Alginate Wound #3 Left,Circumferential Toe Second: Silver Alginate Secondary Dressing: Wound #1 Right,Plantar Foot: Gauze and Kerlix/Conform Nudelman, Artemio ([id number]) Wound #2 Left,Anterior Toe Great: Gauze and Kerlix/Conform Wound #3 Left,Circumferential Toe Second: Gauze and Kerlix/Conform Dressing Change Frequency: Wound #1 Right,Plantar Foot: Change Dressing Monday, Wednesday, Friday Wound #2 Left,Anterior Toe Great: Change Dressing Monday, Wednesday, Friday Wound #3  Left,Circumferential Toe Second: Change Dressing Monday, Wednesday, Friday Follow-up Appointments: Wound #1 Right,Plantar Foot: Return Appointment in 1 week. Wound #2 Left,Anterior Toe Great: Return Appointment in 1 week. Wound #3 Left,Circumferential Toe Second: Return Appointment in 1 week. Edema Control: Wound #1 Right,Plantar Foot: Elevate legs to the level of the heart and pump ankles as often as possible Wound #2 Left,Anterior Toe Great: Elevate legs to the level of the heart and pump ankles as often as possible Wound #3 Left,Circumferential Toe Second: Elevate legs to the level of the heart and pump ankles as often as possible Off-Loading: Wound #1 Right,Plantar Foot: Open toe surgical shoe Wound #2 Left,Anterior Toe Great: Open toe surgical shoe Wound #3 Left,Circumferential Toe Second: Open toe surgical shoe Additional Orders / Instructions: Wound #1 Right,Plantar Foot: Increase protein intake. Wound #2 Left,Anterior Toe Great: Increase protein intake. Wound #3 Left,Circumferential Toe Second: Increase protein intake. Medications-please add to medication list.: Wound #1 Right,Plantar Foot: P.O. Antibiotics Wound #2 Left,Anterior Toe Great: P.O. Antibiotics Wound #3 Left,Circumferential Toe Second: P.O. Antibiotics Consults ordered were: Infectious Disease - [facility] The following medication(s) was prescribed: Bactrim DS oral 800  mg-160 mg tablet 1 1 tablet oral taken 2 times per day for 14 additional days to add on to the current regimen starting 06/16/2020 1. I would recommend currently that we continue with the wound care measures as before specifically with regard to the silver alginate dressing which I think is probably still the best. 2. I am also can go ahead and extend the Bactrim for an additional 2 weeks he does seem to be doing well with that at least until he is able to see infectious disease we are making an infectious disease referral as well. 3. I am also  can recommend that we continue to monitor for any signs of worsening infection. Especially any fevers, chills, nausea, vomiting, or diarrhea if any this occurs patient knows to go to the ER for further evaluation at that point. We will see patient back for reevaluation in 1 week here in the clinic. If anything worsens or changes patient will contact our office for additional recommendations. Electronic Signature(s) Signed: 06/16/2020 4:39:30 PM By: Lenda Kelp PA-C Entered By: Lenda Kelp on 06/16/2020 16:39:30 Yetter, Tyreke ([id number]) -------------------------------------------------------------------------------- SuperBill Details Patient Name: [name] Lenz Date of Service: 06/16/2020 Medical Record Number: [id number] Patient Account Number: [id number] Date of Birth/Sex: [birth year] (59 y.o. M) Treating RN: Rodell Perna Primary Care Provider: Roosvelt Maser Other Clinician: Referring Provider: Roosvelt Maser Treating Provider/Extender: Linwood Dibbles, Larie Mathes Weeks in Treatment: 1 Diagnosis Coding ICD-10 Codes Code Description E11.621 Type 2 diabetes mellitus with foot ulcer 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.528 Non-pressure chronic ulcer of other part of left foot with other specified severity E11.40 Type 2 diabetes mellitus with diabetic neuropathy, unspecified Facility Procedures CPT4 Code: 01601093 Description: 11042 - DEB SUBQ TISSUE 20 SQ CM/< Modifier: Quantity: 1 CPT4 Code: Description: ICD-10 Diagnosis Description L97.522 Non-pressure chronic ulcer of other part of left foot with fat layer expos L97.528 Non-pressure chronic ulcer of other part of left foot with other specified Modifier: ed severity Quantity: CPT4 Code: 23557322 Description: 11044 - DEB BONE 20 SQ CM/< Modifier: Quantity: 1 CPT4 Code: Description: ICD-10 Diagnosis Description L97.528 Non-pressure chronic ulcer of other part of left foot with other  specified L97.522 Non-pressure chronic ulcer of other part of left foot with fat layer expos Modifier: severity ed Quantity: Physician Procedures CPT4: Description Modifier Quantity Code [id number] 99214 - WC PHYS LEVEL 4 - EST PT 25 1 CPT4: ICD-10 Diagnosis Description E11.621 Type 2 diabetes mellitus with foot ulcer 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.528 Non-pressure chronic ulcer of other part of left foot with other specified  severity E11.40 Type 2 diabetes mellitus with diabetic neuropathy, unspecified CPT4: 6237628 11042 - WC PHYS SUBQ TISS 20 SQ CM 1 CPT4: ICD-10 Diagnosis Description 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.528 Non-pressure chronic ulcer of other part of left foot with other specified severity CPT4: 3151761 Debridement; bone (includes epidermis, dermis, subQ tissue, muscle and/or fascia, if performed) 1st 20 1 sqcm or less CPT4: ICD-10 Diagnosis Description L97.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

## 2020-06-21 NOTE — Discharge Instructions (Signed)

## 2020-06-23 ENCOUNTER — Ambulatory Visit: Payer: BC Managed Care – PPO | Admitting: Anesthesiology

## 2020-06-23 ENCOUNTER — Encounter: Payer: Self-pay | Admitting: Ophthalmology

## 2020-06-23 ENCOUNTER — Other Ambulatory Visit: Payer: Self-pay

## 2020-06-23 ENCOUNTER — Ambulatory Visit
Admission: RE | Admit: 2020-06-23 | Discharge: 2020-06-23 | Disposition: A | Discharge status: Home / Self Care | Payer: BC Managed Care – PPO | Attending: Ophthalmology | Admitting: Ophthalmology

## 2020-06-23 ENCOUNTER — Encounter
Admission: RE | Disposition: A | Discharge status: Home / Self Care | Payer: Self-pay | Source: Home / Self Care | Attending: Ophthalmology

## 2020-06-23 DIAGNOSIS — Z880 Allergy status to penicillin: Secondary | ICD-10-CM | POA: Insufficient documentation

## 2020-06-23 DIAGNOSIS — I10 Essential (primary) hypertension: Secondary | ICD-10-CM | POA: Insufficient documentation

## 2020-06-23 DIAGNOSIS — E7800 Pure hypercholesterolemia, unspecified: Secondary | ICD-10-CM | POA: Insufficient documentation

## 2020-06-23 DIAGNOSIS — H2512 Age-related nuclear cataract, left eye: Secondary | ICD-10-CM | POA: Diagnosis not present

## 2020-06-23 DIAGNOSIS — E1136 Type 2 diabetes mellitus with diabetic cataract: Secondary | ICD-10-CM | POA: Diagnosis not present

## 2020-06-23 DIAGNOSIS — Z9841 Cataract extraction status, right eye: Secondary | ICD-10-CM | POA: Diagnosis not present

## 2020-06-23 DIAGNOSIS — K219 Gastro-esophageal reflux disease without esophagitis: Secondary | ICD-10-CM | POA: Insufficient documentation

## 2020-06-23 DIAGNOSIS — H25812 Combined forms of age-related cataract, left eye: Secondary | ICD-10-CM | POA: Diagnosis not present

## 2020-06-23 HISTORY — DX: Osteomyelitis, unspecified: M86.9

## 2020-06-23 HISTORY — PX: CATARACT EXTRACTION W/PHACO: SHX586

## 2020-06-23 LAB — GLUCOSE, CAPILLARY: Glucose-Capillary: 136 mg/dL — ABNORMAL HIGH (ref 70–99)

## 2020-06-23 SURGERY — PHACOEMULSIFICATION, CATARACT, WITH IOL INSERTION
Anesthesia: Monitor Anesthesia Care | Site: Eye | Laterality: Left

## 2020-06-23 MED ORDER — MIDAZOLAM HCL 2 MG/2ML IJ SOLN
INTRAMUSCULAR | Status: DC | PRN
  Administered 2020-06-23: 1 mg via INTRAVENOUS

## 2020-06-23 MED ORDER — BRIMONIDINE TARTRATE-TIMOLOL 0.2-0.5 % OP SOLN
OPHTHALMIC | Status: DC | PRN
  Administered 2020-06-23: 1 [drp] via OPHTHALMIC

## 2020-06-23 MED ORDER — TETRACAINE 0.5 % OP SOLN OPTIME - NO CHARGE
OPHTHALMIC | Status: DC | PRN
  Administered 2020-06-23: 2 [drp] via OPHTHALMIC

## 2020-06-23 MED ORDER — PROVISC 10 MG/ML IO SOLN
INTRAOCULAR | Status: DC | PRN
  Administered 2020-06-23: 0.55 mL via INTRAOCULAR

## 2020-06-23 MED ORDER — LACTATED RINGERS IV SOLN: INTRAVENOUS | Status: DC

## 2020-06-23 MED ORDER — ACETAMINOPHEN 325 MG PO TABS: 325.0000 mg | ORAL_TABLET | Freq: Once | ORAL | Status: DC

## 2020-06-23 MED ORDER — NA CHONDROIT SULF-NA HYALURON 40-17 MG/ML IO SOLN
INTRAOCULAR | Status: DC | PRN
  Administered 2020-06-23: 1 mL via INTRAOCULAR

## 2020-06-23 MED ORDER — TETRACAINE HCL 0.5 % OP SOLN
1.0000 [drp] | OPHTHALMIC | Status: DC | PRN
  Administered 2020-06-23 (×3): 1 [drp] via OPHTHALMIC

## 2020-06-23 MED ORDER — ACETAMINOPHEN 160 MG/5ML PO SOLN: 325.0000 mg | Freq: Once | ORAL | Status: DC

## 2020-06-23 MED ORDER — EPINEPHRINE PF 1 MG/ML IJ SOLN
INTRAOCULAR | Status: DC | PRN
  Administered 2020-06-23: 87 mL via OPHTHALMIC

## 2020-06-23 MED ORDER — LIDOCAINE HCL (PF) 2 % IJ SOLN
INTRAOCULAR | Status: DC | PRN
  Administered 2020-06-23: 1 mL via INTRAOCULAR

## 2020-06-23 MED ORDER — MOXIFLOXACIN HCL 0.5 % OP SOLN
OPHTHALMIC | Status: DC | PRN
  Administered 2020-06-23: 0.2 mL via OPHTHALMIC

## 2020-06-23 MED ORDER — FENTANYL CITRATE (PF) 100 MCG/2ML IJ SOLN
INTRAMUSCULAR | Status: DC | PRN
  Administered 2020-06-23: 50 ug via INTRAVENOUS

## 2020-06-23 MED ORDER — TRYPAN BLUE 0.06 % OP SOLN
OPHTHALMIC | Status: DC | PRN
  Administered 2020-06-23: 0.5 mL via INTRAOCULAR

## 2020-06-23 MED ORDER — ARMC OPHTHALMIC DILATING DROPS
1.0000 "application " | OPHTHALMIC | Status: DC | PRN
  Administered 2020-06-23 (×3): 1 via OPHTHALMIC

## 2020-06-23 SURGICAL SUPPLY — 20 items
DISSECTOR HYDRO NUCLEUS 50X22 (MISCELLANEOUS) ×8 IMPLANT
DRSG TEGADERM 2-3/8X2-3/4 SM (GAUZE/BANDAGES/DRESSINGS) ×2 IMPLANT
GLOVE BIOGEL PI IND STRL 8 (GLOVE) ×1 IMPLANT
GLOVE BIOGEL PI INDICATOR 8 (GLOVE) ×1
GOWN STRL REUS W/ TWL LRG LVL3 (GOWN DISPOSABLE) ×1 IMPLANT
GOWN STRL REUS W/ TWL XL LVL3 (GOWN DISPOSABLE) ×1 IMPLANT
GOWN STRL REUS W/TWL LRG LVL3 (GOWN DISPOSABLE) ×1
GOWN STRL REUS W/TWL XL LVL3 (GOWN DISPOSABLE) ×1
KNIFE 45D UP 2.3 (MISCELLANEOUS) ×2 IMPLANT
LENS IOL DIOP 15.5 (Intraocular Lens) ×2 IMPLANT
LENS IOL TECNIS MONO 15.5 (Intraocular Lens) IMPLANT
MARKER SKIN DUAL TIP RULER LAB (MISCELLANEOUS) ×2 IMPLANT
NDL CAPSULORHEX 25GA (NEEDLE) ×1 IMPLANT
NEEDLE CAPSULORHEX 25GA (NEEDLE) ×2 IMPLANT
PACK CATARACT (MISCELLANEOUS) ×2 IMPLANT
PACK DR. KING ARMS (PACKS) ×2 IMPLANT
PACK EYE AFTER SURG (MISCELLANEOUS) ×2 IMPLANT
SOLUTION OPHTHALMIC SALT (MISCELLANEOUS) ×2 IMPLANT
WATER STERILE IRR 250ML POUR (IV SOLUTION) ×2 IMPLANT
WIPE NON LINTING 3.25X3.25 (MISCELLANEOUS) ×2 IMPLANT

## 2020-06-23 NOTE — Anesthesia Postprocedure Evaluation (Signed)
Anesthesia Post Note  Patient: [name]  Procedure(s) Performed: CATARACT EXTRACTION PHACO AND INTRAOCULAR LENS PLACEMENT (IOC) LEFT DIABETIC VISION BLUE 5.14  00:34.7 (Left Eye)     Patient location during evaluation: PACU Anesthesia Type: MAC Level of consciousness: awake and alert and oriented Pain management: satisfactory to patient Vital Signs Assessment: post-procedure vital signs reviewed and stable Respiratory status: spontaneous breathing, nonlabored ventilation and respiratory function stable Cardiovascular status: blood pressure returned to baseline and stable Postop Assessment: Adequate PO intake and No signs of nausea or vomiting Anesthetic complications: no   No complications documented.  Raliegh Ip

## 2020-06-23 NOTE — Anesthesia Preprocedure Evaluation (Signed)
Anesthesia Evaluation  Patient identified by MRN, date of birth, ID band Patient awake    Reviewed: Allergy & Precautions, H&P , NPO status , Patient's Chart, lab work & pertinent test results  Airway Mallampati: II  TM Distance: >3 FB Neck ROM: full    Dental no notable dental hx.    Pulmonary    Pulmonary exam normal breath sounds clear to auscultation       Cardiovascular hypertension, Normal cardiovascular exam Rhythm:regular Rate:Normal     Neuro/Psych    GI/Hepatic GERD  ,  Endo/Other  diabetes  Renal/GU      Musculoskeletal   Abdominal   Peds  Hematology   Anesthesia Other Findings   Reproductive/Obstetrics                             Anesthesia Physical Anesthesia Plan  ASA: II  Anesthesia Plan: MAC   Post-op Pain Management:    Induction:   PONV Risk Score and Plan: 1 and Treatment may vary due to age or medical condition, Midazolam and TIVA  Airway Management Planned:   Additional Equipment:   Intra-op Plan:   Post-operative Plan:   Informed Consent: I have reviewed the patients History and Physical, chart, labs and discussed the procedure including the risks, benefits and alternatives for the proposed anesthesia with the patient or authorized representative who has indicated his/her understanding and acceptance.     Dental Advisory Given  Plan Discussed with: CRNA  Anesthesia Plan Comments:         Anesthesia Quick Evaluation  

## 2020-06-23 NOTE — Anesthesia Procedure Notes (Signed)
Procedure Name: MAC Date/Time: 06/23/2020 9:15 AM Performed by: Cameron Ali, CRNA Pre-anesthesia Checklist: Patient identified, Emergency Drugs available, Suction available, Timeout performed and Patient being monitored Patient Re-evaluated:Patient Re-evaluated prior to induction Oxygen Delivery Method: Nasal cannula Placement Confirmation: positive ETCO2

## 2020-06-23 NOTE — Op Note (Signed)
  PREOPERATIVE DIAGNOSIS:  Nuclear sclerotic cataract of the LEFT eye.   POSTOPERATIVE DIAGNOSIS:  Nuclear sclerotic cataract of the LEFT eye.   OPERATIVE PROCEDURE: Cataract surgery OS   SURGEON:  Elliot Cousin, MD.   ANESTHESIA:  Anesthesiologist: Ranee Gosselin, MD CRNA: Maree Krabbe, CRNA  1.      Managed anesthesia care. 2.     0.46ml of Shugarcaine was instilled following the paracentesis   COMPLICATIONS:  None.   TECHNIQUE:   Divide and conquer   DESCRIPTION OF PROCEDURE:  The patient was examined and consented in the preoperative holding area where the aforementioned topical anesthesia was applied to the LEFT eye and then brought back to the Operating Room where the left eye was prepped and draped in the usual sterile ophthalmic fashion and a lid speculum was placed. A paracentesis was created with the side port blade, the anterior chamber was washed out with trypan blue to stain the anterior capsule, and the anterior chamber was filled with viscoelastic. A near clear corneal incision was performed with the steel keratome. A continuous curvilinear capsulorrhexis was performed with a cystotome followed by the capsulorrhexis forceps. Hydrodissection and hydrodelineation were carried out with BSS on a blunt cannula. The lens was removed in a divide and conquer  technique and the remaining cortical material was removed with the irrigation-aspiration handpiece. The capsular bag was inflated with viscoelastic and the lens was placed in the capsular bag without complication. The remaining viscoelastic was removed from the eye with the irrigation-aspiration handpiece. The wounds were hydrated. The anterior chamber was flushed and the eye was inflated to physiologic pressure. 0.72ml Vigamox was placed in the anterior chamber. The wounds were found to be water tight. The eye was dressed with Vigamox. The patient was given protective glasses to wear throughout the day and a shield with which to sleep  tonight. The patient was also given drops with which to begin a drop regimen today and will follow-up with me in one day. Implant Name Type Inv. Item Serial No. Manufacturer Lot No. LRB No. Used Action  LENS IOL DIOP 15.5 - [id number] Intraocular Lens LENS IOL DIOP 15.5 [id number] [employer]  Left 1 Implanted    Procedure(s) with comments: CATARACT EXTRACTION PHACO AND INTRAOCULAR LENS PLACEMENT (IOC) LEFT DIABETIC VISION BLUE 5.14  00:34.7 (Left) - DIABETIC  Electronically signed: Elliot Cousin 06/23/2020 9:59 AM

## 2020-06-23 NOTE — H&P (Signed)
   I have reviewed the patient's H&P and agree with its findings. There have been no interval changes.  Atlas Crossland MD Ophthalmology 

## 2020-06-23 NOTE — Transfer of Care (Signed)
Immediate Anesthesia Transfer of Care Note  Patient: [name]  Procedure(s) Performed: CATARACT EXTRACTION PHACO AND INTRAOCULAR LENS PLACEMENT (IOC) LEFT DIABETIC VISION BLUE 5.14  00:34.7 (Left Eye)  Patient Location: PACU  Anesthesia Type: MAC  Level of Consciousness: awake, alert  and patient cooperative  Airway and Oxygen Therapy: Patient Spontanous Breathing and Patient connected to supplemental oxygen  Post-op Assessment: Post-op Vital signs reviewed, Patient's Cardiovascular Status Stable, Respiratory Function Stable, Patent Airway and No signs of Nausea or vomiting  Post-op Vital Signs: Reviewed and stable  Complications: No complications documented.

## 2020-06-24 ENCOUNTER — Encounter: Payer: Self-pay | Admitting: Ophthalmology

## 2020-06-24 LAB — GLUCOSE, CAPILLARY: Glucose-Capillary: 150 mg/dL — ABNORMAL HIGH (ref 70–99)

## 2020-06-27 NOTE — Progress Notes (Signed)
[name] ([id number]) Visit Report for 06/07/2020 Chief Complaint Document Details Patient Name: [name] Date of Service: 06/07/2020 9:15 AM Medical Record Number: [id number] Patient Account Number: [id number] Date of Birth/Sex: 05/05/1961 (59 y.o. M) Treating RN: Huel Coventry Primary Care Provider: Roosvelt Maser Other Clinician: Referring Provider: Roosvelt Maser Treating Provider/Extender: Linwood Dibbles, Kensy Blizard Weeks in Treatment: 0 Information Obtained from: Patient Chief Complaint Left foot/toe ulcers Electronic Signature(s) Signed: 06/07/2020 10:35:18 AM By: Lenda Kelp PA-C Entered By: Lenda Kelp on 06/07/2020 10:35:17 [name], [name] ([id number]) -------------------------------------------------------------------------------- Debridement Details Patient Name: [name] Date of Service: 06/07/2020 9:15 AM Medical Record Number: [id number] Patient Account Number: [id number] Date of Birth/Sex: 21-Jun-1961 (59 y.o. M) Treating RN: Huel Coventry Primary Care Provider: Roosvelt Maser Other Clinician: Referring Provider: Roosvelt Maser Treating Provider/Extender: Linwood Dibbles, Parris Signer Weeks in Treatment: 0 Debridement Performed for Wound #2 Left,Anterior Toe Great Assessment: Performed By: Physician [name], Camie Hauss E., PA-C Debridement Type: Chemical/Enzymatic/Mechanical Agent Used: Saline Severity of Tissue Pre Debridement: Fat layer exposed Level of Consciousness (Pre- Awake and Alert procedure): Pre-procedure Verification/Time Out Yes - 10:40 Taken: Instrument: Other : saline and gauze Bleeding: None Hemostasis Achieved: Pressure Response to Treatment: Procedure was tolerated well Level of Consciousness (Post- Awake and Alert procedure): Post Debridement Measurements of Total Wound Length: (cm) 1.1 Width: (cm) 1.5 Depth: (cm) 0.2 Volume: (cm) 0.259 Character of Wound/Ulcer Post Debridement: Stable Severity of Tissue Post Debridement: Fat layer exposed Post  Procedure Diagnosis Same as Pre-procedure Electronic Signature(s) Signed: 06/07/2020 12:55:53 PM By: Elliot Gurney, BSN, RN, CWS, Kim RN, BSN Signed: 06/27/2020 4:14:28 PM By: Lenda Kelp PA-C Entered By: Elliot Gurney, BSN, RN, CWS, Kim on 06/07/2020 11:02:54 [name], [name] ([id number]) -------------------------------------------------------------------------------- Debridement Details Patient Name: [name] Date of Service: 06/07/2020 9:15 AM Medical Record Number: [id number] Patient Account Number: [id number] Date of Birth/Sex: [birth year] (59 y.o. M) Treating RN: Huel Coventry Primary Care Provider: Roosvelt Maser Other Clinician: Referring Provider: Roosvelt Maser Treating Provider/Extender: Linwood Dibbles, Estefani Bateson Weeks in Treatment: 0 Debridement Performed for Wound #3 Left,Circumferential Toe Second Assessment: Performed By: Physician [name], Lagena Strand E., PA-C Debridement Type: Debridement Severity of Tissue Pre Debridement: Fat layer exposed Level of Consciousness (Pre- Awake and Alert procedure): Pre-procedure Verification/Time Out Yes - 10:40 Taken: Total Area Debrided (L x W): 4 (cm) x 3 (cm) = 12 (cm) Tissue and other material Non-Viable, Eschar, Slough, Subcutaneous, Slough debrided: Level: Skin/Subcutaneous Tissue Debridement Description: Excisional Instrument: Curette, Forceps Specimen: Swab, Number of Specimens Taken: 1 Bleeding: Minimum Hemostasis Achieved: Pressure Response to Treatment: Procedure was tolerated well Level of Consciousness (Post- Awake and Alert procedure): Post Debridement Measurements of Total Wound Length: (cm) 7.4 Width: (cm) 6 Depth: (cm) 2.2 Volume: (cm) 76.718 Character of Wound/Ulcer Post Debridement: Stable Severity of Tissue Post Debridement: Fat layer exposed Post Procedure Diagnosis Same as Pre-procedure Electronic Signature(s) Signed: 06/07/2020 5:17:41 PM By: Lenda Kelp PA-C Signed: 06/07/2020 5:48:04 PM By: Elliot Gurney, BSN, RN, CWS, Kim  RN, BSN Previous Signature: 06/07/2020 12:55:53 PM Version By: Elliot Gurney, BSN, RN, CWS, Kim RN, BSN Entered By: Lenda Kelp on 06/07/2020 17:17:40 [name], [name] ([id number]) -------------------------------------------------------------------------------- HPI Details Patient Name: [name] Date of Service: 06/07/2020 9:15 AM Medical Record Number: [id number] Patient Account Number: [id number] Date of Birth/Sex: 12-18-1960 (59 y.o. M) Treating RN: Huel Coventry Primary Care Provider: Roosvelt Maser Other Clinician: Referring Provider: Roosvelt Maser Treating Provider/Extender: Linwood Dibbles, Javae Braaten Weeks in Treatment: 0 History of Present Illness HPI Description: 06/10/2020 upon evaluation today patient appears to be doing poorly  in regard to his right plantar foot, left great toe, and left second toe. Unfortunately he has significant wounds at these locations but especially in regard to the second toe which does have a lot of necrotic tissue over the distal portion of the toe. The great toe does have some necrotic tissue as well. With that being said the patient tells me that this has been going on for 1-2 months and seems to be getting worse especially in regard to the toe. He has not had any specific x-rays to identify obvious signs of osteomyelitis up to this point that something would likely get a different today as well. With that being said he also has not had any current arterial studies which I think is something else that we probably will look into performing at this point. He does have a history of diabetes mellitus type 2, and diabetic neuropathy. Currently he has been using normal shoes for ambulation though he does have some kind of external device so that he does not have to have actual still toed boots anymore at work which he states has helped nonetheless we is at work he does have to have something that protects his feet as such per policy. He tells me he is having to do a lot more  walking right now compared to normal but he can try to see if he can change something in that regard. Electronic Signature(s) Signed: 06/10/2020 3:44:56 PM By: Lenda Kelp PA-C Entered By: Lenda Kelp on 06/10/2020 15:44:56 [name], [name] ([id number]) -------------------------------------------------------------------------------- Physical Exam Details Patient Name: [name] Date of Service: 06/07/2020 9:15 AM Medical Record Number: [id number] Patient Account Number: [id number] Date of Birth/Sex: 03-22-1961 (59 y.o. M) Treating RN: Huel Coventry Primary Care Provider: Roosvelt Maser Other Clinician: Referring Provider: Roosvelt Maser Treating Provider/Extender: Linwood Dibbles, Kmya Placide Weeks in Treatment: 0 Constitutional patient is hypertensive.. pulse regular and within target range for patient.Marland Kitchen respirations regular, non-labored and within target range for patient.Marland Kitchen temperature within target range for patient.. Well-nourished and well-hydrated in no acute distress. Eyes conjunctiva clear no eyelid edema noted. pupils equal round and reactive to light and accommodation. Ears, Nose, Mouth, and Throat no gross abnormality of ear auricles or external auditory canals. normal hearing noted during conversation. mucus membranes moist. Respiratory normal breathing without difficulty. Cardiovascular 2+ dorsalis pedis/posterior tibialis pulses. no clubbing, cyanosis, significant edema, <3 sec cap refill. Musculoskeletal normal gait and posture. no significant deformity or arthritic changes, no loss or range of motion, no clubbing. Psychiatric this patient is able to make decisions and demonstrates good insight into disease process. Alert and Oriented x 3. pleasant and cooperative. Notes Upon inspection patient's wound bed actually showed signs of good granulation at this time. Fortunately there is no evidence of active infection systemically although I am concerned about infection in the toe  particular in fact the distal portion of the toe does not appear to be stable I am concerned about breakdown of the bone in this location. I was able to debride away some of the necrotic tissue which did expose the fact that the patient does have a more extensive wound at the nailbed location. With that being said I think that this is can be something that we will have to continue to monitor and further evaluate I Ernie Hew have the patient have an x-ray were also can evaluate an arterial test and then subsequently depending on the results of everything we will then make a determinations on what we go treatment wise  going forward. Electronic Signature(s) Signed: 06/10/2020 3:45:51 PM By: Lenda Kelp PA-C Entered By: Lenda Kelp on 06/10/2020 15:45:50 [name], [name] ([id number]) -------------------------------------------------------------------------------- Physician Orders Details Patient Name: [name] Date of Service: 06/07/2020 9:15 AM Medical Record Number: [id number] Patient Account Number: [id number] Date of Birth/Sex: 27-Nov-1961 (59 y.o. M) Treating RN: Huel Coventry Primary Care Provider: Roosvelt Maser Other Clinician: Referring Provider: Roosvelt Maser Treating Provider/Extender: Linwood Dibbles, Omunique Pederson Weeks in Treatment: 0 Verbal / Phone Orders: No Diagnosis Coding ICD-10 Coding Code Description E11.621 Type 2 diabetes mellitus with foot ulcer 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.528 Non-pressure chronic ulcer of other part of left foot with other specified severity E11.40 Type 2 diabetes mellitus with diabetic neuropathy, unspecified Wound Cleansing Wound #1 Right,Plantar Foot o Clean wound with Normal Saline. Wound #2 Left,Anterior Toe Great o Clean wound with Normal Saline. Wound #3 Left,Circumferential Toe Second o Clean wound with Normal Saline. Primary Wound Dressing Wound #1 Right,Plantar Foot o Alginate Wound  #2 Left,Anterior Toe Great o Alginate Wound #3 Left,Circumferential Toe Second o Alginate Secondary Dressing Wound #1 Right,Plantar Foot o Gauze and Kerlix/Conform Wound #2 Left,Anterior Toe Great o Gauze and Kerlix/Conform Wound #3 Left,Circumferential Toe Second o Gauze and Kerlix/Conform Dressing Change Frequency Wound #1 Right,Plantar Foot o Change Dressing Monday, Wednesday, Friday Wound #2 Left,Anterior Toe Great o Change Dressing Monday, Wednesday, Friday Wound #3 Left,Circumferential Toe Second o Change Dressing Monday, Wednesday, Friday Follow-up Appointments Wound #1 Right,Plantar Foot o Return Appointment in 1 week. Wound #2 Left,Anterior Toe Great o Return Appointment in 1 week. Wound #3 Left,Circumferential Toe Second [name], [name] ([id number]) o Return Appointment in 1 week. Edema Control Wound #1 Right,Plantar Foot o Elevate legs to the level of the heart and pump ankles as often as possible Wound #2 Left,Anterior Toe Great o Elevate legs to the level of the heart and pump ankles as often as possible Wound #3 Left,Circumferential Toe Second o Elevate legs to the level of the heart and pump ankles as often as possible Off-Loading Wound #2 Left,Anterior Toe Great o Open toe surgical shoe Wound #3 Left,Circumferential Toe Second o Open toe surgical shoe Additional Orders / Instructions Wound #1 Right,Plantar Foot o Increase protein intake. Wound #2 Left,Anterior Toe Great o Increase protein intake. Wound #3 Left,Circumferential Toe Second o Increase protein intake. Medications-please add to medication list. Wound #1 Right,Plantar Foot o P.O. Antibiotics Wound #2 Left,Anterior Toe Great o P.O. Antibiotics Wound #3 Left,Circumferential Toe Second o P.O. Antibiotics Laboratory o Bacteria identified in Wound by Culture (MICRO) - left 2nd toe oooo LOINC Code: 6462-6 oooo Convenience Name: Wound culture  routine Radiology o X-ray, toes - Left 2nd toe Services and Therapies o Ankle Brachial Index (ABI) - ABI/TBI with consult Patient Medications Allergies: amoxicillin Notifications Medication Indication Start End Bactrim DS 06/08/2020 DOSE 1 - oral 800 mg-160 mg tablet - 1 tablet oral taken 2 times per day for 14 days Electronic Signature(s) Signed: 06/08/2020 2:48:27 PM By: Lenda Kelp PA-C Previous Signature: 06/07/2020 12:55:53 PM Version By: Elliot Gurney, BSN, RN, CWS, Kim RN, BSN [place], [place] ([id number]) Entered By: Lenda Kelp on 06/08/2020 14:48:27 [name], [name] ([id number]) -------------------------------------------------------------------------------- Problem List Details Patient Name: [name] Date of Service: 06/07/2020 9:15 AM Medical Record Number: [id number] Patient Account Number: [id number] Date of Birth/Sex: May 25, 1961 (59 y.o. M) Treating RN: Huel Coventry Primary Care Provider: Roosvelt Maser Other Clinician: Referring Provider: Roosvelt Maser Treating Provider/Extender: Linwood Dibbles, Iyari Hagner Weeks in  Treatment: 0 Active Problems ICD-10 Encounter Code Description Active Date MDM Diagnosis E11.621 Type 2 diabetes mellitus with foot ulcer 06/07/2020 No Yes L97.522 Non-pressure chronic ulcer of other part of left foot with fat layer 06/07/2020 No Yes exposed L97.528 Non-pressure chronic ulcer of other part of left foot with other specified 06/07/2020 No Yes severity E11.40 Type 2 diabetes mellitus with diabetic neuropathy, unspecified 06/07/2020 No Yes Inactive Problems Resolved Problems Electronic Signature(s) Signed: 06/07/2020 10:35:03 AM By: Lenda Kelp PA-C Entered By: Lenda Kelp on 06/07/2020 10:35:02 [name], [name] ([id number]) -------------------------------------------------------------------------------- Progress Note Details Patient Name: [name] Date of Service: 06/07/2020 9:15 AM Medical Record Number: [id number] Patient Account  Number: [id number] Date of Birth/Sex: 02-26-1961 (59 y.o. M) Treating RN: Huel Coventry Primary Care Provider: Roosvelt Maser Other Clinician: Referring Provider: Roosvelt Maser Treating Provider/Extender: Linwood Dibbles, Brandace Cargle Weeks in Treatment: 0 Subjective Chief Complaint Information obtained from Patient Left foot/toe ulcers History of Present Illness (HPI) 06/10/2020 upon evaluation today patient appears to be doing poorly in regard to his right plantar foot, left great toe, and left second toe. Unfortunately he has significant wounds at these locations but especially in regard to the second toe which does have a lot of necrotic tissue over the distal portion of the toe. The great toe does have some necrotic tissue as well. With that being said the patient tells me that this has been going on for 1-2 months and seems to be getting worse especially in regard to the toe. He has not had any specific x-rays to identify obvious signs of osteomyelitis up to this point that something would likely get a different today as well. With that being said he also has not had any current arterial studies which I think is something else that we probably will look into performing at this point. He does have a history of diabetes mellitus type 2, and diabetic neuropathy. Currently he has been using normal shoes for ambulation though he does have some kind of external device so that he does not have to have actual still toed boots anymore at work which he states has helped nonetheless we is at work he does have to have something that protects his feet as such per policy. He tells me he is having to do a lot more walking right now compared to normal but he can try to see if he can change something in that regard. Patient History Information obtained from Patient. Allergies amoxicillin Family History Diabetes - Maternal Grandparents, No family history of Stroke. Social History Never smoker, Marital Status - Single,  Alcohol Use - Never, Drug Use - No History, Caffeine Use - Daily. Medical History Eyes Patient has history of Cataracts - right removed, left 7/15 Endocrine Patient has history of Type II Diabetes Neurologic Patient has history of Neuropathy Patient is treated with Insulin, Oral Agents. Blood sugar is not tested. Medical And Surgical History Notes Cardiovascular HIgh Cholesterol Review of Systems (ROS) Eyes Denies complaints or symptoms of Dry Eyes, Vision Changes, Glasses / Contacts. Ear/Nose/Mouth/Throat Denies complaints or symptoms of Difficult clearing ears, Sinusitis. Hematologic/Lymphatic Denies complaints or symptoms of Bleeding / Clotting Disorders, Human Immunodeficiency Virus. Respiratory Denies complaints or symptoms of Chronic or frequent coughs, Shortness of Breath. Cardiovascular Denies complaints or symptoms of Chest pain, LE edema. Gastrointestinal Denies complaints or symptoms of Frequent diarrhea, Nausea, Vomiting. Genitourinary Denies complaints or symptoms of Kidney failure/ Dialysis, Incontinence/dribbling. Immunological Denies complaints or symptoms of Hives, Itching. Integumentary (Skin) Complains or has symptoms  of Wounds. Musculoskeletal [name], [name] ([id number]) Denies complaints or symptoms of Muscle Pain, Muscle Weakness. Neurologic Denies complaints or symptoms of Numbness/parasthesias, Focal/Weakness. Psychiatric Denies complaints or symptoms of Anxiety, Claustrophobia. Objective Constitutional patient is hypertensive.. pulse regular and within target range for patient.Marland Kitchen respirations regular, non-labored and within target range for patient.Marland Kitchen temperature within target range for patient.. Well-nourished and well-hydrated in no acute distress. Vitals Time Taken: 9:47 AM, Height: 72 in, Weight: 249 lbs, BMI: 33.8, Temperature: 98.4 F, Pulse: 75 bpm, Respiratory Rate: 18 breaths/min, Blood Pressure: 152/74 mmHg. Eyes conjunctiva clear no  eyelid edema noted. pupils equal round and reactive to light and accommodation. Ears, Nose, Mouth, and Throat no gross abnormality of ear auricles or external auditory canals. normal hearing noted during conversation. mucus membranes moist. Respiratory normal breathing without difficulty. Cardiovascular 2+ dorsalis pedis/posterior tibialis pulses. no clubbing, cyanosis, significant edema, Musculoskeletal normal gait and posture. no significant deformity or arthritic changes, no loss or range of motion, no clubbing. Psychiatric this patient is able to make decisions and demonstrates good insight into disease process. Alert and Oriented x 3. pleasant and cooperative. General Notes: Upon inspection patient's wound bed actually showed signs of good granulation at this time. Fortunately there is no evidence of active infection systemically although I am concerned about infection in the toe particular in fact the distal portion of the toe does not appear to be stable I am concerned about breakdown of the bone in this location. I was able to debride away some of the necrotic tissue which did expose the fact that the patient does have a more extensive wound at the nailbed location. With that being said I think that this is can be something that we will have to continue to monitor and further evaluate I Ernie Hew have the patient have an x-ray were also can evaluate an arterial test and then subsequently depending on the results of everything we will then make a determinations on what we go treatment wise going forward. Integumentary (Hair, Skin) Wound #1 status is Open. Original cause of wound was Trauma. The wound is located on the Right,Plantar Foot. The wound measures 0.5cm length x 0.6cm width x 0.3cm depth; 0.236cm^2 area and 0.071cm^3 volume. There is Fat Layer (Subcutaneous Tissue) Exposed exposed. There is no tunneling noted, however, there is undermining starting at 8:00 and ending at 5:00 with a  maximum distance of 0.4cm. There is a medium amount of serous drainage noted. The wound margin is flat and intact. There is large (67-100%) red granulation within the wound bed. There is a small (1-33%) amount of necrotic tissue within the wound bed including Adherent Slough. Wound #2 status is Open. Original cause of wound was Gradually Appeared. The wound is located on the [employer]. The wound measures 1.1cm length x 1.5cm width x 0.2cm depth; 1.296cm^2 area and 0.259cm^3 volume. There is Fat Layer (Subcutaneous Tissue) Exposed exposed. There is no tunneling noted, however, there is undermining starting at 8:00 and ending at 2:00 with a maximum distance of 0.9cm. There is a medium amount of serous drainage noted. The wound margin is epibole. There is no granulation within the wound bed. There is a large (67-100%) amount of necrotic tissue within the wound bed including Adherent Slough. Wound #3 status is Open. Original cause of wound was Gradually Appeared. The wound is located on the Left,Circumferential Toe Second. The wound measures 7cm length x 6cm width x 2.2cm depth; 32.987cm^2 area and 72.571cm^3 volume. There is bone and Fat Layer (  Subcutaneous Tissue) Exposed exposed. There is no tunneling or undermining noted. There is a medium amount of serous drainage noted. The wound margin is flat and intact. There is no granulation within the wound bed. There is a large (67-100%) amount of necrotic tissue within the wound bed including Eschar and Adherent Slough. Assessment Active Problems ICD-10 Type 2 diabetes mellitus with foot ulcer [name], [name] ([id number]) Non-pressure chronic ulcer of other part of left foot with fat layer exposed Non-pressure chronic ulcer of other part of left foot with other specified severity Type 2 diabetes mellitus with diabetic neuropathy, unspecified Procedures Wound #2 Pre-procedure diagnosis of Wound #2 is a Diabetic Wound/Ulcer of the Lower  Extremity located on the Left,Anterior Toe Great .Severity of Tissue Pre Debridement is: Fat layer exposed. There was a Chemical/Enzymatic/Mechanical debridement performed by [name], Yaris Ferrell E., PA-C. With the following instrument(s): saline and gauze to remove Non-Viable tissue/material. Material removed includes Eschar, Subcutaneous Tissue, and Slough. Other agent used was Saline. A time out was conducted at 10:40, prior to the start of the procedure. There was no bleeding. The procedure was tolerated well. Post Debridement Measurements: 1.1cm length x 1.5cm width x 0.2cm depth; 0.259cm^3 volume. Character of Wound/Ulcer Post Debridement is stable. Severity of Tissue Post Debridement is: Fat layer exposed. Post procedure Diagnosis Wound #2: Same as Pre-Procedure Wound #3 Pre-procedure diagnosis of Wound #3 is a Diabetic Wound/Ulcer of the Lower Extremity located on the Left,Circumferential Toe Second .Severity of Tissue Pre Debridement is: Fat layer exposed. There was a Excisional Skin/Subcutaneous Tissue Debridement with a total area of 12 sq cm performed by [name], Darion Juhasz E., PA-C. With the following instrument(s): Curette, and Forceps to remove Non-Viable tissue/material. Material removed includes Eschar, Subcutaneous Tissue, and Slough. 1 specimen was taken by a Swab and sent to the lab per facility protocol. A time out was conducted at 10:40, prior to the start of the procedure. A Minimum amount of bleeding was controlled with Pressure. The procedure was tolerated well. Post Debridement Measurements: 7.4cm length x 6cm width x 2.2cm depth; 76.718cm^3 volume. Character of Wound/Ulcer Post Debridement is stable. Severity of Tissue Post Debridement is: Fat layer exposed. Post procedure Diagnosis Wound #3: Same as Pre-Procedure Plan Wound Cleansing: Wound #1 Right,Plantar Foot: Clean wound with Normal Saline. Wound #2 Left,Anterior Toe Great: Clean wound with Normal Saline. Wound #3  Left,Circumferential Toe Second: Clean wound with Normal Saline. Primary Wound Dressing: Wound #1 Right,Plantar Foot: Alginate Wound #2 Left,Anterior Toe Great: Alginate Wound #3 Left,Circumferential Toe Second: Alginate Secondary Dressing: Wound #1 Right,Plantar Foot: Gauze and Kerlix/Conform Wound #2 Left,Anterior Toe Great: Gauze and Kerlix/Conform Wound #3 Left,Circumferential Toe Second: Gauze and Kerlix/Conform Dressing Change Frequency: Wound #1 Right,Plantar Foot: Change Dressing Monday, Wednesday, Friday Wound #2 Left,Anterior Toe Great: Change Dressing Monday, Wednesday, Friday Wound #3 Left,Circumferential Toe Second: Change Dressing Monday, Wednesday, Friday Follow-up Appointments: Wound #1 Right,Plantar Foot: Return Appointment in 1 week. Wound #2 Left,Anterior Toe Great: Return Appointment in 1 week. Wound #3 Left,Circumferential Toe Second: Return Appointment in 1 week. Edema Control: Wound #1 Right,Plantar Foot: Elevate legs to the level of the heart and pump ankles as often as possible Wound #2 Left,Anterior Toe Great: [name], [name] ([id number]) Elevate legs to the level of the heart and pump ankles as often as possible Wound #3 Left,Circumferential Toe Second: Elevate legs to the level of the heart and pump ankles as often as possible Off-Loading: Wound #2 Left,Anterior Toe Great: Open toe surgical shoe Wound #3 Left,Circumferential Toe Second:  Open toe surgical shoe Additional Orders / Instructions: Wound #1 Right,Plantar Foot: Increase protein intake. Wound #2 Left,Anterior Toe Great: Increase protein intake. Wound #3 Left,Circumferential Toe Second: Increase protein intake. Medications-please add to medication list.: Wound #1 Right,Plantar Foot: P.O. Antibiotics Wound #2 Left,Anterior Toe Great: P.O. Antibiotics Wound #3 Left,Circumferential Toe Second: P.O. Antibiotics Laboratory ordered were: Wound culture routine - left 2nd  toe Radiology ordered were: X-ray, toes - Left 2nd toe Services and Therapies ordered were: Ankle Brachial Index (ABI) - ABI/TBI with consult The following medication(s) was prescribed: Bactrim DS oral 800 mg-160 mg tablet 1 1 tablet oral taken 2 times per day for 14 days starting 06/08/2020 1. I am going to suggest currently that we go ahead and initiate treatment with a alginate dressing I think this is probably can be the best way to go. We will not use silver for now since the patient is going to be going to x-ray and again I do not want this to interfere with the x-ray. 2. I am also can recommend that we go ahead and wrap this with roll gauze to secure in place along with gauze to cover. 3. I am also going to suggest that we put the patient in an open toe surgical shoe I think this is what he should wear all the time he can especially even at work but if he cannot do this at work that he needs to try and keep the walking to a minimum. 4. I am can order arterial studies with ABI and TBI as well for the patient. 5. Due to the fact that I do believe this patient likely has some type of infection I am going to start him on Bactrim DS which will be the initial antibiotic of choice until we can hopefully get results back from the culture which was obtained today as well. We will see patient back for reevaluation in 1 week here in the clinic. If anything worsens or changes patient will contact our office for additional recommendations. Electronic Signature(s) Signed: 06/10/2020 3:47:44 PM By: Lenda KelpStone III, Dodi Leu PA-C Entered By: Lenda KelpStone III, Sabra Sessler on 06/10/2020 15:47:44 [name], [name] ([id number]) -------------------------------------------------------------------------------- ROS/PFSH Details Patient Name: [name] LenzNORRIS, Kemet Date of Service: 06/07/2020 9:15 AM Medical Record Number: [id number] Patient Account Number: [id number] Date of Birth/Sex: 09/24/1961 (59 y.o. M) Treating RN: Huel CoventryWoody, Kim Primary Care  Provider: Roosvelt MaserLANE, RACHEL Other Clinician: Referring Provider: Roosvelt MaserLANE, RACHEL Treating Provider/Extender: Linwood DibblesSTONE III, Atari Novick Weeks in Treatment: 0 Information Obtained From Patient Eyes Complaints and Symptoms: Negative for: Dry Eyes; Vision Changes; Glasses / Contacts Medical History: Positive for: Cataracts - right removed, left 7/15 Ear/Nose/Mouth/Throat Complaints and Symptoms: Negative for: Difficult clearing ears; Sinusitis Hematologic/Lymphatic Complaints and Symptoms: Negative for: Bleeding / Clotting Disorders; Human Immunodeficiency Virus Respiratory Complaints and Symptoms: Negative for: Chronic or frequent coughs; Shortness of Breath Cardiovascular Complaints and Symptoms: Negative for: Chest pain; LE edema Medical History: Past Medical History Notes: HIgh Cholesterol Gastrointestinal Complaints and Symptoms: Negative for: Frequent diarrhea; Nausea; Vomiting Genitourinary Complaints and Symptoms: Negative for: Kidney failure/ Dialysis; Incontinence/dribbling Immunological Complaints and Symptoms: Negative for: Hives; Itching Integumentary (Skin) Complaints and Symptoms: Positive for: Wounds Musculoskeletal Complaints and Symptoms: Negative for: Muscle Pain; Muscle Weakness Gabor, [name] ([id number]) Neurologic Complaints and Symptoms: Negative for: Numbness/parasthesias; Focal/Weakness Medical History: Positive for: Neuropathy Psychiatric Complaints and Symptoms: Negative for: Anxiety; Claustrophobia Endocrine Medical History: Positive for: Type II Diabetes Time with diabetes: 17 years Treated with: Insulin, Oral agents Blood sugar tested every day: No Oncologic HBO Extended  History Items Eyes: Cataracts Immunizations Pneumococcal Vaccine: Received Pneumococcal Vaccination: No Implantable Devices None Family and Social History Diabetes: Yes - Maternal Grandparents; Stroke: No; Never smoker; Marital Status - Single; Alcohol Use: Never; Drug Use:  No History; Caffeine Use: Daily Electronic Signature(s) Signed: 06/07/2020 12:55:53 PM By: Elliot Gurney, BSN, RN, CWS, Kim RN, BSN Signed: 06/27/2020 4:14:28 PM By: Lenda Kelp PA-C Entered By: Elliot Gurney BSN, RN, CWS, Kim on 06/07/2020 09:59:36 [name], Zubin ([id number]) -------------------------------------------------------------------------------- SuperBill Details Patient Name: [name] Date of Service: 06/07/2020 Medical Record Number: [id number] Patient Account Number: [id number] Date of Birth/Sex: 02/01/1961 (59 y.o. 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 E11.40 Type 2 diabetes mellitus with diabetic neuropathy, unspecified Facility Procedures CPT4 Code: 78469629 Description: 99213 - WOUND CARE VISIT-LEV 3 EST PT Modifier: Quantity: 1 CPT4 Code: 52841324 Description: 11042 - DEB SUBQ TISSUE 20 SQ CM/< Modifier: Quantity: 1 CPT4 Code: Description: ICD-10 Diagnosis Description L97.528 Non-pressure chronic ulcer of other part of left foot with other specified Modifier: severity Quantity: Physician Procedures CPT4 Code: 4010272 Description: 99204 - WC PHYS LEVEL 4 - NEW PT Modifier: 25 Quantity: 1 CPT4 Code: Description: ICD-10 Diagnosis Description E11.621 Type 2 diabetes mellitus with foot ulcer L97.522 Non-pressure chronic ulcer of other part of left foot with fat layer expos L97.528 Non-pressure chronic ulcer of other part of left foot with other  specified E11.40 Type 2 diabetes mellitus with diabetic neuropathy, unspecified Modifier: ed  severity Quantity: CPT4 Code: 5366440 Description: 11042 - WC PHYS SUBQ TISS 20 SQ CM Modifier: Quantity: 1 CPT4 Code: Description: ICD-10 Diagnosis Description L97.528 Non-pressure chronic ulcer of other part of left foot with other specified Modifier: severity Quantity: Electronic Signature(s) Signed: 06/07/2020 5:18:14 PM By: Lenda Kelp PA-C Previous Signature: 06/07/2020 5:13:48 PM Version By: Lenda Kelp PA-C Previous Signature: 06/07/2020 12:55:53 PM Version By: Elliot Gurney, BSN, RN, CWS, Kim RN, BSN Entered By: Lenda Kelp on 06/07/2020 17:18:14

## 2020-06-30 ENCOUNTER — Other Ambulatory Visit: Payer: Self-pay

## 2020-06-30 ENCOUNTER — Encounter: Payer: BC Managed Care – PPO | Admitting: Physician Assistant

## 2020-06-30 DIAGNOSIS — E1140 Type 2 diabetes mellitus with diabetic neuropathy, unspecified: Secondary | ICD-10-CM | POA: Diagnosis not present

## 2020-06-30 DIAGNOSIS — L97522 Non-pressure chronic ulcer of other part of left foot with fat layer exposed: Secondary | ICD-10-CM | POA: Diagnosis not present

## 2020-06-30 DIAGNOSIS — L97512 Non-pressure chronic ulcer of other part of right foot with fat layer exposed: Secondary | ICD-10-CM | POA: Diagnosis not present

## 2020-06-30 DIAGNOSIS — L97528 Non-pressure chronic ulcer of other part of left foot with other specified severity: Secondary | ICD-10-CM | POA: Diagnosis not present

## 2020-06-30 DIAGNOSIS — E11621 Type 2 diabetes mellitus with foot ulcer: Secondary | ICD-10-CM | POA: Diagnosis not present

## 2020-06-30 NOTE — Progress Notes (Addendum)
[name], [name] ([id number]) Visit Report for 06/30/2020 Chief Complaint Document Details Patient Name: [name], [name] Date of Service: 06/30/2020 3:00 PM Medical Record Number: [id number] Patient Account Number: [id number] Date of Birth/Sex: 1961/05/18 (59 y.o. M) Treating RN: Rodell Perna Primary Care Provider: Roosvelt Maser Other Clinician: Referring Provider: Roosvelt Maser Treating Provider/Extender: Linwood Dibbles, Aison Malveaux Weeks in Treatment: 3 Information Obtained from: Patient Chief Complaint Left foot/toe ulcers Electronic Signature(s) Signed: 06/30/2020 3:20:51 PM By: Lenda Kelp PA-C Entered By: Lenda Kelp on 06/30/2020 15:20:49 [name], [name] ([id number]) -------------------------------------------------------------------------------- Debridement Details Patient Name: [name] Date of Service: 06/30/2020 3:00 PM Medical Record Number: [id number] Patient Account Number: [id number] Date of Birth/Sex: [birth year] (59 y.o. M) Treating RN: Rodell Perna Primary Care Provider: Roosvelt Maser Other Clinician: Referring Provider: Roosvelt Maser Treating Provider/Extender: Linwood Dibbles, Evone Arseneau Weeks in Treatment: 3 Debridement Performed for Wound #2 Left,Anterior Toe Great Assessment: Performed By: Physician [name], Blakelyn Dinges E., PA-C Debridement Type: Debridement Severity of Tissue Pre Debridement: Fat layer exposed Level of Consciousness (Pre- Awake and Alert procedure): Pre-procedure Verification/Time Out Yes - 15:25 Taken: Start Time: 15:27 Pain Control: Lidocaine Total Area Debrided (L x W): 1 (cm) x 1 (cm) = 1 (cm) Tissue and other material Viable, Non-Viable, Callus, Slough, Subcutaneous, Slough debrided: Level: Skin/Subcutaneous Tissue Debridement Description: Excisional Instrument: Curette Bleeding: Minimum Hemostasis Achieved: Pressure End Time: 15:29 Response to Treatment: Procedure was tolerated well Level of Consciousness (Post- Awake and  Alert procedure): Post Debridement Measurements of Total Wound Length: (cm) 1 Width: (cm) 1 Depth: (cm) 0.2 Volume: (cm) 0.157 Character of Wound/Ulcer Post Debridement: Stable Severity of Tissue Post Debridement: Fat layer exposed Post Procedure Diagnosis Same as Pre-procedure Electronic Signature(s) Signed: 06/30/2020 4:00:14 PM By: Rodell Perna Signed: 07/01/2020 2:35:58 PM By: Lenda Kelp PA-C Entered By: Rodell Perna on 06/30/2020 15:31:45 [name], [name] ([id number]) -------------------------------------------------------------------------------- Debridement Details Patient Name: [name] Date of Service: 06/30/2020 3:00 PM Medical Record Number: [id number] Patient Account Number: [id number] Date of Birth/Sex: 09-04-1961 (59 y.o. M) Treating RN: Rodell Perna Primary Care Provider: Roosvelt Maser Other Clinician: Referring Provider: Roosvelt Maser Treating Provider/Extender: Linwood Dibbles, Ashlee Bewley Weeks in Treatment: 3 Debridement Performed for Wound #3 Left,Circumferential Toe Second Assessment: Performed By: Physician [name], Maddux First E., PA-C Debridement Type: Debridement Severity of Tissue Pre Debridement: Bone involvement without necrosis Level of Consciousness (Pre- Awake and Alert procedure): Pre-procedure Verification/Time Out Yes - 15:25 Taken: Start Time: 15:27 Pain Control: Lidocaine Total Area Debrided (L x W): 0.5 (cm) x 0.6 (cm) = 0.3 (cm) Tissue and other material Viable, Non-Viable, Callus, Slough, Subcutaneous, Slough debrided: Level: Skin/Subcutaneous Tissue Debridement Description: Excisional Instrument: Curette Bleeding: Minimum Hemostasis Achieved: Pressure End Time: 15:29 Response to Treatment: Procedure was tolerated well Level of Consciousness (Post- Awake and Alert procedure): Post Debridement Measurements of Total Wound Length: (cm) 0.5 Width: (cm) 0.6 Depth: (cm) 0.2 Volume: (cm) 0.047 Character of Wound/Ulcer Post Debridement:  Stable Severity of Tissue Post Debridement: Fat layer exposed Post Procedure Diagnosis Same as Pre-procedure Electronic Signature(s) Signed: 06/30/2020 4:00:14 PM By: Rodell Perna Signed: 07/01/2020 2:35:58 PM By: Lenda Kelp PA-C Entered By: Rodell Perna on 06/30/2020 15:32:02 [name], [name] ([id number]) -------------------------------------------------------------------------------- Debridement Details Patient Name: [name] Date of Service: 06/30/2020 3:00 PM Medical Record Number: [id number] Patient Account Number: [id number] Date of Birth/Sex: 15-Sep-1961 (59 y.o. M) Treating RN: Rodell Perna Primary Care Provider: Roosvelt Maser Other Clinician: Referring Provider: Roosvelt Maser Treating Provider/Extender: Linwood Dibbles, Yue Glasheen Weeks in Treatment: 3 Debridement Performed for Wound #1 Right,Plantar Foot Assessment:  Performed By: Physician [name], Tangala Wiegert E., PA-C Debridement Type: Debridement Severity of Tissue Pre Debridement: Fat layer exposed Level of Consciousness (Pre- Awake and Alert procedure): Pre-procedure Verification/Time Out Yes - 15:25 Taken: Start Time: 15:27 Pain Control: Lidocaine Total Area Debrided (L x W): 0.4 (cm) x 0.3 (cm) = 0.12 (cm) Tissue and other material Viable, Non-Viable, Callus, Slough, Subcutaneous, Slough debrided: Level: Skin/Subcutaneous Tissue Debridement Description: Excisional Instrument: Curette Bleeding: Minimum Hemostasis Achieved: Pressure End Time: 15:29 Response to Treatment: Procedure was tolerated well Level of Consciousness (Post- Awake and Alert procedure): Post Debridement Measurements of Total Wound Length: (cm) 0.4 Width: (cm) 0.3 Depth: (cm) 0.2 Volume: (cm) 0.019 Character of Wound/Ulcer Post Debridement: Stable Severity of Tissue Post Debridement: Fat layer exposed Post Procedure Diagnosis Same as Pre-procedure Electronic Signature(s) Signed: 06/30/2020 4:00:14 PM By: Rodell Perna Signed: 07/01/2020  2:35:58 PM By: Lenda Kelp PA-C Entered By: Rodell Perna on 06/30/2020 15:34:41 [name], [name] ([id number]) -------------------------------------------------------------------------------- HPI Details Patient Name: [name] Date of Service: 06/30/2020 3:00 PM Medical Record Number: [id number] Patient Account Number: [id number] Date of Birth/Sex: [birth year] (59 y.o. M) Treating RN: Rodell Perna Primary Care Provider: Roosvelt Maser Other Clinician: Referring Provider: Roosvelt Maser Treating Provider/Extender: Linwood Dibbles, Zarrah Loveland Weeks in Treatment: 3 History of Present Illness HPI Description: 06/10/2020 upon evaluation today patient appears to be doing poorly in regard to his right plantar foot, left great toe, and left second toe. Unfortunately he has significant wounds at these locations but especially in regard to the second toe which does have a lot of necrotic tissue over the distal portion of the toe. The great toe does have some necrotic tissue as well. With that being said the patient tells me that this has been going on for 1-2 months and seems to be getting worse especially in regard to the toe. He has not had any specific x-rays to identify obvious signs of osteomyelitis up to this point that something would likely get a different today as well. With that being said he also has not had any current arterial studies which I think is something else that we probably will look into performing at this point. He does have a history of diabetes mellitus type 2, and diabetic neuropathy. Currently he has been using normal shoes for ambulation though he does have some kind of external device so that he does not have to have actual still toed boots anymore at work which he states has helped nonetheless we is at work he does have to have something that protects his feet as such per policy. He tells me he is having to do a lot more walking right now compared to normal but he can try to see if he  can change something in that regard. 06/16/2020 upon evaluation today patient appears to be doing well all things considered with regard to his wounds. I do not feel like anything is it any worse and overall I feel like the Bactrim has done well. The culture that I reviewed did not reveal any specific organisms unfortunately that would help tailor treatment. I did actually review the x-ray as well it does appear that he has evidence of osteomyelitis of the distal tuft of the second toe left foot. That was discussed with him today. He likely will need a referral to infectious disease. 06/30/2020 on evaluation today patient appears to be doing well at this time in regard to his wounds all things considered. The erythema has improved he does have evidence of  osteomyelitis again the second toe on the left foot but at the other sites there were no obvious signs. He does have wounds bilaterally on his feet and toes. These are to require sharp debridement today. He is still pending as far as his appointment with infectious disease. With that being said this is actually scheduled for 29 July which is this month. He also has an appointment on the 27th with vascular. Overall at least we are getting to the point where I think we are getting something is taken care of here. Electronic Signature(s) Signed: 06/30/2020 4:09:11 PM By: Lenda Kelp PA-C Entered By: Lenda Kelp on 06/30/2020 16:09:11 [name], [name] ([id number]) -------------------------------------------------------------------------------- Physical Exam Details Patient Name: [name] Date of Service: 06/30/2020 3:00 PM Medical Record Number: [id number] Patient Account Number: [id number] Date of Birth/Sex: 1961-08-27 (59 y.o. M) Treating RN: Rodell Perna Primary Care Provider: Roosvelt Maser Other Clinician: Referring Provider: Roosvelt Maser Treating Provider/Extender: [name], Randi [employer] Weeks in Treatment: 3 Constitutional Well-nourished  and well-hydrated in no acute distress. Respiratory normal breathing without difficulty. Psychiatric this patient is able to make decisions and demonstrates good insight into disease process. Alert and Oriented x 3. pleasant and cooperative. Notes Upon inspection patient's wound bed actually showed signs of good granulation at this time there does not appear to be any evidence of active infection I did perform sharp debridement to clear away some of the slough and was able to clear that as well as callus away from around the edges of the wound the patient tolerated this today without complication post debridement wound bed appears to be doing much better. Electronic Signature(s) Signed: 06/30/2020 4:09:33 PM By: Lenda Kelp PA-C Entered By: Lenda Kelp on 06/30/2020 16:09:32 [name], [name] ([id number]) -------------------------------------------------------------------------------- Physician Orders Details Patient Name: [name] Date of Service: 06/30/2020 3:00 PM Medical Record Number: [id number] Patient Account Number: [id number] Date of Birth/Sex: [birth year] (59 y.o. M) Treating RN: Rodell Perna Primary Care Provider: Roosvelt Maser Other Clinician: Referring Provider: Roosvelt Maser Treating Provider/Extender: Linwood Dibbles, Tanette Chauca Weeks in Treatment: 3 Verbal / Phone Orders: No Diagnosis Coding ICD-10 Coding Code Description E11.621 Type 2 diabetes mellitus with foot ulcer 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.528 Non-pressure chronic ulcer of other part of left foot with other specified severity E11.40 Type 2 diabetes mellitus with diabetic neuropathy, unspecified Wound Cleansing Wound #1 Right,Plantar Foot o Clean wound with Normal Saline. Wound #2 Left,Anterior Toe Great o Clean wound with Normal Saline. Wound #3 Left,Circumferential Toe Second o Clean wound with Normal Saline. Primary Wound Dressing Wound #1  Right,Plantar Foot o Silver Alginate Wound #2 Left,Anterior Toe Great o Silver Alginate Wound #3 Left,Circumferential Toe Second o Silver Alginate Secondary Dressing Wound #1 Right,Plantar Foot o Gauze and Kerlix/Conform Wound #2 Left,Anterior Toe Great o Gauze and Kerlix/Conform Wound #3 Left,Circumferential Toe Second o Gauze and Kerlix/Conform Dressing Change Frequency Wound #1 Right,Plantar Foot o Change Dressing Monday, Wednesday, Friday Wound #2 Left,Anterior Toe Great o Change Dressing Monday, Wednesday, Friday Wound #3 Left,Circumferential Toe Second o Change Dressing Monday, Wednesday, Friday Follow-up Appointments Wound #1 Right,Plantar Foot o Return Appointment in 1 week. Wound #2 Left,Anterior Toe Great o Return Appointment in 1 week. Wound #3 Left,Circumferential Toe Second [name], [name] ([id number]) o Return Appointment in 1 week. Edema Control Wound #1 Right,Plantar Foot o Elevate legs to the level of the heart and pump ankles as often as possible Wound #2 Left,Anterior Toe Great o Elevate  legs to the level of the heart and pump ankles as often as possible Wound #3 Left,Circumferential Toe Second o Elevate legs to the level of the heart and pump ankles as often as possible Off-Loading Wound #1 Right,Plantar Foot o Open toe surgical shoe Wound #2 Left,Anterior Toe Great o Open toe surgical shoe Wound #3 Left,Circumferential Toe Second o Open toe surgical shoe Additional Orders / Instructions Wound #1 Right,Plantar Foot o Increase protein intake. Wound #2 Left,Anterior Toe Great o Increase protein intake. Wound #3 Left,Circumferential Toe Second o Increase protein intake. Medications-please add to medication list. Wound #1 Right,Plantar Foot o P.O. Antibiotics Wound #2 Left,Anterior Toe Great o P.O. Antibiotics Wound #3 Left,Circumferential Toe Second o P.O. Antibiotics Electronic  Signature(s) Signed: 06/30/2020 4:00:14 PM By: Rodell Perna Signed: 07/01/2020 2:35:58 PM By: Lenda Kelp PA-C Entered By: Rodell Perna on 06/30/2020 15:29:14 [name], [name] ([id number]) -------------------------------------------------------------------------------- Problem List Details Patient Name: [name] Date of Service: 06/30/2020 3:00 PM Medical Record Number: [id number] Patient Account Number: [id number] Date of Birth/Sex: October 10, 1961 (59 y.o. M) Treating RN: Rodell Perna Primary Care Provider: Roosvelt Maser Other Clinician: Referring Provider: Roosvelt Maser Treating Provider/Extender: Lenda Kelp Weeks in Treatment: 3 Active Problems ICD-10 Encounter Code Description Active Date MDM Diagnosis E11.621 Type 2 diabetes mellitus with foot ulcer 06/07/2020 No Yes L97.522 Non-pressure chronic ulcer of other part of left foot with fat layer 06/07/2020 No Yes exposed L97.528 Non-pressure chronic ulcer of other part of left foot with other specified 06/07/2020 No Yes severity E11.40 Type 2 diabetes mellitus with diabetic neuropathy, unspecified 06/07/2020 No Yes Inactive Problems Resolved Problems Electronic Signature(s) Signed: 06/30/2020 3:20:42 PM By: Lenda Kelp PA-C Entered By: Lenda Kelp on 06/30/2020 15:20:40 [name], [name] ([id number]) -------------------------------------------------------------------------------- Progress Note Details Patient Name: [name] Date of Service: 06/30/2020 3:00 PM Medical Record Number: [id number] Patient Account Number: [id number] Date of Birth/Sex: [birth year] (59 y.o. M) Treating RN: Rodell Perna Primary Care Provider: Roosvelt Maser Other Clinician: Referring Provider: Roosvelt Maser Treating Provider/Extender: Linwood Dibbles, Natividad Halls Weeks in Treatment: 3 Subjective Chief Complaint Information obtained from Patient Left foot/toe ulcers History of Present Illness (HPI) 06/10/2020 upon evaluation today patient appears to be  doing poorly in regard to his right plantar foot, left great toe, and left second toe. Unfortunately he has significant wounds at these locations but especially in regard to the second toe which does have a lot of necrotic tissue over the distal portion of the toe. The great toe does have some necrotic tissue as well. With that being said the patient tells me that this has been going on for 1-2 months and seems to be getting worse especially in regard to the toe. He has not had any specific x-rays to identify obvious signs of osteomyelitis up to this point that something would likely get a different today as well. With that being said he also has not had any current arterial studies which I think is something else that we probably will look into performing at this point. He does have a history of diabetes mellitus type 2, and diabetic neuropathy. Currently he has been using normal shoes for ambulation though he does have some kind of external device so that he does not have to have actual still toed boots anymore at work which he states has helped nonetheless we is at work he does have to have something that protects his feet as such per policy. He tells me he is having to do a lot more walking right  now compared to normal but he can try to see if he can change something in that regard. 06/16/2020 upon evaluation today patient appears to be doing well all things considered with regard to his wounds. I do not feel like anything is it any worse and overall I feel like the Bactrim has done well. The culture that I reviewed did not reveal any specific organisms unfortunately that would help tailor treatment. I did actually review the x-ray as well it does appear that he has evidence of osteomyelitis of the distal tuft of the second toe left foot. That was discussed with him today. He likely will need a referral to infectious disease. 06/30/2020 on evaluation today patient appears to be doing well at this time  in regard to his wounds all things considered. The erythema has improved he does have evidence of osteomyelitis again the second toe on the left foot but at the other sites there were no obvious signs. He does have wounds bilaterally on his feet and toes. These are to require sharp debridement today. He is still pending as far as his appointment with infectious disease. With that being said this is actually scheduled for 29 July which is this month. He also has an appointment on the 27th with vascular. Overall at least we are getting to the point where I think we are getting something is taken care of here. Objective Constitutional Well-nourished and well-hydrated in no acute distress. Vitals Time Taken: 2:50 PM, Height: 72 in, Weight: 249 lbs, BMI: 33.8, Temperature: 98.2 F, Pulse: 84 bpm, Respiratory Rate: 16 breaths/min, Blood Pressure: 124/66 mmHg. Respiratory normal breathing without difficulty. Psychiatric this patient is able to make decisions and demonstrates good insight into disease process. Alert and Oriented x 3. pleasant and cooperative. General Notes: Upon inspection patient's wound bed actually showed signs of good granulation at this time there does not appear to be any evidence of active infection I did perform sharp debridement to clear away some of the slough and was able to clear that as well as callus away from around the edges of the wound the patient tolerated this today without complication post debridement wound bed appears to be doing much better. Integumentary (Hair, Skin) Wound #1 status is Open. Original cause of wound was Trauma. The wound is located on the Right,Plantar Foot. The wound measures 0.4cm length x 0.3cm width x 0.2cm depth; 0.094cm^2 area and 0.019cm^3 volume. There is Fat Layer (Subcutaneous Tissue) Exposed exposed. There is no tunneling noted, however, there is undermining starting at 12:00 and ending at 12:00 with a maximum distance of 0.5cm. There  is a medium amount of sanguinous drainage noted. The wound margin is flat and intact. There is large (67-100%) red granulation within the wound bed. There is a small (1-33%) amount of necrotic tissue within the wound bed including Adherent Slough. [name], [name] ([id number]) Wound #2 status is Open. Original cause of wound was Gradually Appeared. The wound is located on the [employer]. The wound measures 1cm length x 1cm width x 0.2cm depth; 0.785cm^2 area and 0.157cm^3 volume. There is Fat Layer (Subcutaneous Tissue) Exposed exposed. There is a medium amount of serous drainage noted. The wound margin is epibole. There is no granulation within the wound bed. There is a large (67-100%) amount of necrotic tissue within the wound bed including Adherent Slough. Wound #3 status is Open. Original cause of wound was Gradually Appeared. The wound is located on the Left,Circumferential Toe Second. The wound measures 0.5cm  length x 0.6cm width x 0.1cm depth; 0.236cm^2 area and 0.024cm^3 volume. There is Fat Layer (Subcutaneous Tissue) Exposed exposed. There is a small amount of serous drainage noted. The wound margin is flat and intact. There is no granulation within the wound bed. There is a large (67-100%) amount of necrotic tissue within the wound bed including Adherent Slough. General Notes: second photo small crack at bSE OF TOE. 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 There was a Excisional Skin/Subcutaneous Tissue Debridement with a total area of 0.12 sq cm  performed by [name], Donald Jacque E., PA-C. With the following instrument(s): Curette to remove Viable and Non-Viable tissue/material. Material removed includes Callus, Subcutaneous Tissue, and Slough after achieving pain control using Lidocaine. A time out was conducted at 15:25, prior to the start of the procedure. A Minimum amount of bleeding was controlled with Pressure. The procedure was tolerated well. Post Debridement Measurements: 0.4cm length x 0.3cm width x 0.2cm depth; 0.019cm^3 volume. Character of Wound/Ulcer Post Debridement is stable. Severity of Tissue Post Debridement is: Fat layer exposed. Post procedure Diagnosis Wound #1: Same as Pre-Procedure Wound #2 Pre-procedure diagnosis of Wound #2 is a Diabetic Wound/Ulcer of the Lower Extremity located on the Left,Anterior Toe Great .Severity of Tissue Pre Debridement is: Fat layer exposed. There was a Excisional Skin/Subcutaneous Tissue Debridement with a total area of 1 sq cm performed by [name], Icey Tello E., PA-C. With the following instrument(s): Curette to remove Viable and Non-Viable tissue/material. Material removed includes Callus, Subcutaneous Tissue, and Slough after achieving pain control using Lidocaine. A time out was conducted at 15:25, prior to the start of the procedure. A Minimum amount of bleeding was controlled with Pressure. The procedure was tolerated well. Post Debridement Measurements: 1cm length x 1cm width x 0.2cm depth; 0.157cm^3 volume. Character of Wound/Ulcer Post Debridement is stable. Severity of Tissue Post Debridement is: Fat layer exposed. Post procedure Diagnosis Wound #2: Same as Pre-Procedure Wound #3 Pre-procedure diagnosis of Wound #3 is a Diabetic Wound/Ulcer of the Lower Extremity located on the Left,Circumferential Toe Second .Severity of Tissue Pre Debridement is: Bone involvement without necrosis. There was a Excisional Skin/Subcutaneous Tissue Debridement with a total area of 0.3 sq cm performed by  [name], Hodge Stachnik E., PA-C. With the following instrument(s): Curette to remove Viable and Non-Viable tissue/material. Material removed includes Callus, Subcutaneous Tissue, and Slough after achieving pain control using Lidocaine. A time out was conducted at 15:25, prior to the start of the procedure. A Minimum amount of bleeding was controlled with Pressure. The procedure was tolerated well. Post Debridement Measurements: 0.5cm length x 0.6cm width x 0.2cm depth; 0.047cm^3 volume. Character of Wound/Ulcer Post Debridement is stable. Severity of Tissue Post Debridement is: Fat layer exposed. Post procedure Diagnosis Wound #3: Same as Pre-Procedure Plan Wound Cleansing: Wound #1 Right,Plantar Foot: Clean wound with Normal Saline. Wound #2 Left,Anterior Toe Great: Clean wound with Normal Saline. [name], [name] ([id number]) Wound #3 Left,Circumferential Toe Second: Clean wound with Normal Saline. Primary Wound Dressing: Wound #1 Right,Plantar Foot: Silver Alginate Wound #2 Left,Anterior Toe Great: Silver Alginate Wound #3 Left,Circumferential Toe Second: Silver Alginate Secondary  Dressing: Wound #1 Right,Plantar Foot: Gauze and Kerlix/Conform Wound #2 Left,Anterior Toe Great: Gauze and Kerlix/Conform Wound #3 Left,Circumferential Toe Second: Gauze and Kerlix/Conform Dressing Change Frequency: Wound #1 Right,Plantar Foot: Change Dressing Monday, Wednesday, Friday Wound #2 Left,Anterior Toe Great: Change Dressing Monday, Wednesday, Friday Wound #3 Left,Circumferential Toe Second: Change Dressing Monday, Wednesday, Friday Follow-up Appointments: Wound #1 Right,Plantar Foot: Return Appointment in 1 week. Wound #2 Left,Anterior Toe Great: Return Appointment in 1 week. Wound #3 Left,Circumferential Toe Second: Return Appointment in 1 week. Edema Control: Wound #1 Right,Plantar Foot: Elevate legs to the level of the heart and pump ankles as often as possible Wound #2 Left,Anterior  Toe Great: Elevate legs to the level of the heart and pump ankles as often as possible Wound #3 Left,Circumferential Toe Second: Elevate legs to the level of the heart and pump ankles as often as possible Off-Loading: Wound #1 Right,Plantar Foot: Open toe surgical shoe Wound #2 Left,Anterior Toe Great: Open toe surgical shoe Wound #3 Left,Circumferential Toe Second: Open toe surgical shoe Additional Orders / Instructions: Wound #1 Right,Plantar Foot: Increase protein intake. Wound #2 Left,Anterior Toe Great: Increase protein intake. Wound #3 Left,Circumferential Toe Second: Increase protein intake. Medications-please add to medication list.: Wound #1 Right,Plantar Foot: P.O. Antibiotics Wound #2 Left,Anterior Toe Great: P.O. Antibiotics Wound #3 Left,Circumferential Toe Second: P.O. Antibiotics 1. I am going to suggest currently that we go ahead and continue with the silver alginate dressing I still think that is probably the best medical at this point. 2. I am also can recommend that the patient needs to continue with appropriate offloading to prevent any pressure to the toe and foot region. He is using an open toe surgical shoe in this regard. 3. I am also can recommend the patient continue with his oral antibiotics I do believe that has been somewhat beneficial for him. 4. He also has an appointment with infectious disease scheduled for the 29th of this month which is great that is next week. 5. He also has an appointment with vascular on the 27th of this month which is next week as well. We will see patient back for reevaluation in 1 week here in the clinic. If anything worsens or changes patient will contact our office for additional recommendations. Electronic Signature(s) Signed: 06/30/2020 4:11:29 PM By: Lenda Kelp PA-C Entered By: Lenda Kelp on 06/30/2020 16:11:29 Quain, [name] ([id number]) MAXIM, Virat  ([id number]) -------------------------------------------------------------------------------- SuperBill Details Patient Name: [name] Date of Service: 06/30/2020 Medical Record Number: [id number] Patient Account Number: [id number] Date of Birth/Sex: 19-Feb-1961 (59 y.o. 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 E11.40 Type 2 diabetes mellitus with diabetic neuropathy, unspecified Facility Procedures CPT4 Code: 48546270 Description: 11042 - DEB SUBQ TISSUE 20 SQ CM/< Modifier: Quantity: 1 CPT4 Code: Description: ICD-10 Diagnosis Description L97.522 Non-pressure chronic ulcer of other part of left foot with fat layer expos L97.528 Non-pressure chronic ulcer of other part of left foot with other specified Modifier: ed severity Quantity: Physician Procedures CPT4 Code: 3500938 Description: 11042 - WC PHYS SUBQ TISS 20 SQ CM Modifier: Quantity: 1 CPT4 Code: Description: ICD-10 Diagnosis Description L97.522 Non-pressure chronic ulcer of other part of  left foot with fat layer expos L97.528 Non-pressure chronic ulcer of other part of left foot with other specified Modifier: ed severity Quantity: Electronic Signature(s) Signed: 06/30/2020 4:11:47 PM By: Lenda Kelp PA-C Entered By: Lenda Kelp on 06/30/2020 16:11:45

## 2020-07-01 NOTE — Progress Notes (Signed)
BROOKES, CRAINE ([id number]) Visit Report for 06/30/2020 Arrival Information Details Patient Name: [name], [name] Date of Service: 06/30/2020 3:00 PM Medical Record Number: [id number] Patient Account Number: [id number] Date of Birth/Sex: 1961-01-29 (59 y.o. M) Treating RN: Huel Coventry Primary Care Temprence Rhines: Roosvelt Maser Other Clinician: Referring Rafi Kenneth: Roosvelt Maser Treating My Rinke/Extender: Linwood Dibbles, HOYT Weeks in Treatment: 3 Visit Information History Since Last Visit Added or deleted any medications: No Patient Arrived: Ambulatory Any new allergies or adverse reactions: No Arrival Time: 14:56 Had a fall or experienced change in No Accompanied By: self activities of daily living that may affect Transfer Assistance: None risk of falls: Patient Identification Verified: Yes Signs or symptoms of abuse/neglect since last visito No Secondary Verification Process Completed: Yes Hospitalized since last visit: No Patient Requires Transmission-Based Precautions: No Implantable device outside of the clinic excluding No Patient Has Alerts: Yes cellular tissue based products placed in the center Patient Alerts: Type II Diabetic since last visit: Has Dressing in Place as Prescribed: Yes Pain Present Now: No Electronic Signature(s) Signed: 06/30/2020 6:25:29 PM By: Elliot Gurney, BSN, RN, CWS, Kim RN, BSN Entered By: Elliot Gurney, BSN, RN, CWS, Kim on 06/30/2020 14:58:14 Fees, Benino ([id number]) -------------------------------------------------------------------------------- [name] Date of Service: 06/30/2020 3:00 PM Medical Record Number: [id number] Patient Account Number: [id number] Date of Birth/Sex: 18-Mar-1961 (59 y.o. M) Treating RN: Rodell Perna Primary Care Broadus Costilla: Roosvelt Maser Other Clinician: Referring Martyn Timme: Roosvelt Maser Treating Eleny Cortez/Extender: Linwood Dibbles, HOYT Weeks in Treatment: 3 Encounter Discharge Information  Items Post Procedure Vitals Discharge Condition: Stable Temperature (F): 98.2 Ambulatory Status: Ambulatory Pulse (bpm): 84 Discharge Destination: Home Respiratory Rate (breaths/min): 16 Transportation: Private Auto Blood Pressure (mmHg): 124/66 Accompanied By: self Schedule Follow-up Appointment: Yes Clinical Summary of Care: Electronic Signature(s) Signed: 06/30/2020 4:00:14 PM By: Rodell Perna Entered By: Rodell Perna on 06/30/2020 15:30:57 Brannigan, Maurie ([id number]) -------------------------------------------------------------------------------- Multi Wound Chart Details Patient Name: [name] Date of Service: 06/30/2020 3:00 PM Medical Record Number: [id number] Patient Account Number: [id number] Date of Birth/Sex: [birth year] (59 y.o. M) Treating RN: Rodell Perna Primary Care Javonne Dorko: Roosvelt Maser Other Clinician: Referring Krithi Bray: Roosvelt Maser Treating Timmya Blazier/Extender: Linwood Dibbles, HOYT Weeks in Treatment: 3 Vital Signs Height(in): 72 Pulse(bpm): 84 Weight(lbs): 249 Blood Pressure(mmHg): 124/66 Body Mass Index(BMI): 34 Temperature(F): 98.2 Respiratory Rate(breaths/min): 16 Photos: Wound Location: Right, Plantar Foot Left, Anterior Toe Great Left, Circumferential Toe Second Wounding Event: Trauma Gradually Appeared Gradually Appeared Primary Etiology: Trauma, Other Diabetic Wound/Ulcer of the Lower Diabetic Wound/Ulcer of the Lower Extremity Extremity Secondary Etiology: Diabetic Wound/Ulcer of the Lower N/A N/A Extremity Comorbid History: Cataracts, Type II Diabetes, Cataracts, Type II Diabetes, Cataracts, Type II Diabetes, Neuropathy Neuropathy Neuropathy Date Acquired: 05/17/2020 05/07/2020 05/17/2020 Weeks of Treatment: 3 3 3  Wound Status: Open Open Open Measurements L x W x D (cm) 0.4x0.3x0.2 1x1x0.2 0.5x0.6x0.1 Area (cm) : 0.094 0.785 0.236 Volume (cm) : 0.019 0.157 0.024 % Reduction in Area: 60.20% 39.40% 99.30% % Reduction in Volume: 73.20% 39.40%  100.00% Starting Position 1 (o'clock): 12 Ending Position 1 (o'clock): 12 Maximum Distance 1 (cm): 0.5 Undermining: Yes N/A N/A Classification: Full Thickness Without Exposed Grade 1 Grade 2 Support Structures Exudate Amount: Medium Medium Small Exudate Type: Sanguinous Serous Serous Exudate Color: red amber amber Wound Margin: Flat and Intact Epibole Flat and Intact Granulation Amount: Large (67-100%) None Present (0%) None Present (0%) Granulation Quality: Red N/A N/A Necrotic Amount: Small (1-33%) Large (67-100%) Large (67-100%) Exposed Structures: Fat Layer (Subcutaneous Tissue) Fat Layer (Subcutaneous Tissue) Fat Layer (  Subcutaneous Tissue) Exposed: Yes Exposed: Yes Exposed: Yes Fascia: No Fascia: No Fascia: No Mcgeachy, Carlo ([id number]) Tendon: No Tendon: No Tendon: No Muscle: No Muscle: No Muscle: No Joint: No Joint: No Joint: No Bone: No Bone: No Bone: No Epithelialization: None None N/A Assessment Notes: N/A N/A second photo small crack at bSE OF TOE. HEled Treatment Notes Electronic Signature(s) Signed: 06/30/2020 4:00:14 PM By: Rodell Perna Entered By: Rodell Perna on 06/30/2020 15:26:07 Borba, Dandrea ([id number]) -------------------------------------------------------------------------------- Multi-Disciplinary Care Plan Details Patient Name: [name] Date of Service: 06/30/2020 3:00 PM Medical Record Number: [id number] Patient Account Number: [id number] Date of Birth/Sex: December 26, 1960 (59 y.o. M) Treating RN: Rodell Perna Primary Care Canaan Prue: Roosvelt Maser Other Clinician: Referring Angelea Penny: Roosvelt Maser Treating Sedona Wenk/Extender: Linwood Dibbles, HOYT Weeks in Treatment: 3 Active Inactive Necrotic Tissue Nursing Diagnoses: Impaired tissue integrity related to necrotic/devitalized tissue Goals: Necrotic/devitalized tissue will be minimized in the wound bed Date Initiated: 06/07/2020 Target Resolution Date: 07/06/2020 Goal Status:  Active Interventions: Assess patient pain level pre-, during and post procedure and prior to discharge Treatment Activities: Excisional debridement : 06/07/2020 Notes: Orientation to the Wound Care Program Nursing Diagnoses: Knowledge deficit related to the wound healing center program Goals: Patient/caregiver will verbalize understanding of the Wound Healing Center Program Date Initiated: 06/07/2020 Target Resolution Date: 07/06/2020 Goal Status: Active Interventions: Provide education on orientation to the wound center Notes: Osteomyelitis Nursing Diagnoses: Potential for infection: osteomyelitis Goals: Diagnostic evaluation for osteomyelitis completed as ordered Date Initiated: 06/07/2020 Target Resolution Date: 07/06/2020 Goal Status: Active Patient/caregiver will verbalize understanding of disease process and disease management Date Initiated: 06/07/2020 Target Resolution Date: 07/06/2020 Goal Status: Active Patient's osteomyelitis will resolve Date Initiated: 06/07/2020 Target Resolution Date: 07/06/2020 Goal Status: Active Signs and symptoms for osteomyelitis will be recognized and promptly addressed Date Initiated: 06/07/2020 Target Resolution Date: 07/06/2020 Goal Status: Active Interventions: Assess for signs and symptoms of osteomyelitis resolution every visit Geathers, Dravin ([id number]) Provide education on osteomyelitis Notes: Peripheral Neuropathy Nursing Diagnoses: Knowledge deficit related to disease process and management of peripheral neurovascular dysfunction Goals: Patient/caregiver will verbalize understanding of disease process and disease management Date Initiated: 06/07/2020 Target Resolution Date: 07/06/2020 Goal Status: Active Interventions: Assess signs and symptoms of neuropathy upon admission and as needed Provide education on Management of Neuropathy and Related Ulcers Notes: Pressure Nursing Diagnoses: Knowledge deficit related to management  of pressures ulcers Goals: Patient will remain free from development of additional pressure ulcers Date Initiated: 06/07/2020 Target Resolution Date: 07/06/2020 Goal Status: Active Patient will remain free of pressure ulcers Date Initiated: 06/07/2020 Target Resolution Date: 07/06/2020 Goal Status: Active Interventions: Assess: immobility, friction, shearing, incontinence upon admission and as needed Notes: Soft Tissue Infection Nursing Diagnoses: Impaired tissue integrity Goals: Patient/caregiver will verbalize understanding of or measures to prevent infection and contamination in the home setting Date Initiated: 06/07/2020 Target Resolution Date: 07/06/2020 Goal Status: Active Interventions: Assess signs and symptoms of infection every visit Treatment Activities: Culture and sensitivity : 06/07/2020 Notes: Wound/Skin Impairment Nursing Diagnoses: Impaired tissue integrity Goals: Patient/caregiver will verbalize understanding of skin care regimen Date Initiated: 06/07/2020 Target Resolution Date: 07/06/2020 Goal Status: Active Ulcer/skin breakdown will have a volume reduction of 30% by week 4 Date Initiated: 06/07/2020 Target Resolution Date: 07/06/2020 Goal Status: Active Tibbits, Amjad ([id number]) Interventions: Assess patient/caregiver ability to obtain necessary supplies Assess ulceration(s) every visit Treatment Activities: Referred to DME Lakya Schrupp for dressing supplies : 06/07/2020 Notes: Electronic Signature(s) Signed: 06/30/2020 4:00:14 PM By: Rodell Perna Entered By: Rodell Perna on 06/30/2020 15:25:55 Denman,  Emari ([id number]) -------------------------------------------------------------------------------- Pain Assessment Details Patient Name: RAYEN, DAFOE Date of Service: 06/30/2020 3:00 PM Medical Record Number: [id number] Patient Account Number: [id number] Date of Birth/Sex: 1961-08-01 (59 y.o. M) Treating RN: Huel Coventry Primary Care Sheriann Newmann: Roosvelt Maser  Other Clinician: Referring Daysha Ashmore: Roosvelt Maser Treating Kyrsten Deleeuw/Extender: Linwood Dibbles, HOYT Weeks in Treatment: 3 Active Problems Location of Pain Severity and Description of Pain Patient Has Paino No Site Locations Pain Management and Medication Current Pain Management: Electronic Signature(s) Signed: 06/30/2020 6:25:29 PM By: Elliot Gurney, BSN, RN, CWS, Kim RN, BSN Entered By: Elliot Gurney, BSN, RN, CWS, Kim on 06/30/2020 15:01:17 Heidler, Ledarrius ([id number]) -------------------------------------------------------------------------------- Patient/Caregiver Education Details Patient Name: [name] Date of Service: 06/30/2020 3:00 PM Medical Record Number: [id number] Patient Account Number: [id number] Date of Birth/Gender: 08-30-1961 (59 y.o. M) Treating RN: Rodell Perna Primary Care Physician: Roosvelt Maser Other Clinician: Referring Physician: Roosvelt Maser Treating Physician/Extender: Skeet Simmer in Treatment: 3 Education Assessment Education Provided To: Patient Education Topics Provided Wound/Skin Impairment: Handouts: Caring for Your Ulcer Methods: Demonstration, Explain/Verbal Responses: State content correctly Electronic Signature(s) Signed: 06/30/2020 4:00:14 PM By: Rodell Perna Entered By: Rodell Perna on 06/30/2020 15:29:29 Knittle, Dontravious ([id number]) -------------------------------------------------------------------------------- Wound Assessment Details Patient Name: [name] Date of Service: 06/30/2020 3:00 PM Medical Record Number: [id number] Patient Account Number: [id number] Date of Birth/Sex: 1961-08-19 (59 y.o. M) Treating RN: Huel Coventry Primary Care Dorris Vangorder: Roosvelt Maser Other Clinician: Referring Azad Calame: Roosvelt Maser Treating Wilba Mutz/Extender: Linwood Dibbles, HOYT Weeks in Treatment: 3 Wound Status Wound Number: 1 Primary Etiology: Trauma, Other Wound Location: Right, Plantar Foot Secondary Etiology: Diabetic Wound/Ulcer of the Lower  Extremity Wounding Event: Trauma Wound Status: Open Date Acquired: 05/17/2020 Comorbid History: Cataracts, Type II Diabetes, Neuropathy Weeks Of Treatment: 3 Clustered Wound: No Photos Wound Measurements Length: (cm) 0.4 Width: (cm) 0.3 Depth: (cm) 0.2 Area: (cm) 0.094 Volume: (cm) 0.019 % Reduction in Area: 60.2% % Reduction in Volume: 73.2% Epithelialization: None Tunneling: No Undermining: Yes Starting Position (o'clock): 12 Ending Position (o'clock): 12 Maximum Distance: (cm) 0.5 Wound Description Classification: Full Thickness Without Exposed Support Structures Wound Margin: Flat and Intact Exudate Amount: Medium Exudate Type: Sanguinous Exudate Color: red Foul Odor After Cleansing: No Slough/Fibrino No Wound Bed Granulation Amount: Large (67-100%) Exposed Structure Granulation Quality: Red Fascia Exposed: No Necrotic Amount: Small (1-33%) Fat Layer (Subcutaneous Tissue) Exposed: Yes Necrotic Quality: Adherent Slough Tendon Exposed: No Muscle Exposed: No Joint Exposed: No Bone Exposed: No Treatment Notes Wound #1 (Right, Plantar Foot) Frisinger, Angus ([id number]) Notes scell, conform Electronic Signature(s) Signed: 06/30/2020 6:25:29 PM By: Elliot Gurney, BSN, RN, CWS, Kim RN, BSN Entered By: Elliot Gurney, BSN, RN, CWS, Kim on 06/30/2020 15:09:23 Alpern, Vicky ([id number]) -------------------------------------------------------------------------------- Wound Assessment Details Patient Name: [name] Date of Service: 06/30/2020 3:00 PM Medical Record Number: [id number] Patient Account Number: [id number] Date of Birth/Sex: 1960-12-29 (59 y.o. M) Treating RN: Huel Coventry Primary Care Andora Krull: Roosvelt Maser Other Clinician: Referring Greidy Sherard: Roosvelt Maser Treating Dekisha Mesmer/Extender: Linwood Dibbles, HOYT Weeks in Treatment: 3 Wound Status Wound Number: 2 Primary Etiology: Diabetic Wound/Ulcer of the Lower Extremity Wound Location: Left, Anterior Toe Great Wound Status:  Open Wounding Event: Gradually Appeared Comorbid History: Cataracts, Type II Diabetes, Neuropathy Date Acquired: 05/07/2020 Weeks Of Treatment: 3 Clustered Wound: No Photos Wound Measurements Length: (cm) 1 Width: (cm) 1 Depth: (cm) 0.2 Area: (cm) 0.785 Volume: (cm) 0.157 % Reduction in Area: 39.4% % Reduction in Volume: 39.4% Epithelialization: None Wound Description Classification: Grade 1 Wound Margin: Epibole Exudate Amount: Medium Exudate Type: Serous Exudate Color: amber  Foul Odor After Cleansing: No Slough/Fibrino Yes Wound Bed Granulation Amount: None Present (0%) Exposed Structure Necrotic Amount: Large (67-100%) Fascia Exposed: No Necrotic Quality: Adherent Slough Fat Layer (Subcutaneous Tissue) Exposed: Yes Tendon Exposed: No Muscle Exposed: No Joint Exposed: No Bone Exposed: No Treatment Notes Wound #2 (Left, Anterior Toe Great) Notes scell, conform Electronic Signature(s) Signed: 06/30/2020 6:25:29 PM By: Elliot GurneyWoody, BSN, RN, CWS, Kim RN, BSN TevistonNORRIS, Chace ([id number]) Entered By: Elliot GurneyWoody, BSN, RN, CWS, Kim on 06/30/2020 15:12:11 Conerly, Whittaker ([id number]) -------------------------------------------------------------------------------- Wound Assessment Details Patient Name: [name] LenzNORRIS, Taiquan Date of Service: 06/30/2020 3:00 PM Medical Record Number: [id number] Patient Account Number: [id number] Date of Birth/Sex: 10/16/1961 (59 y.o. M) Treating RN: Huel CoventryWoody, Kim Primary Care Elsworth Ledin: Roosvelt MaserLANE, RACHEL Other Clinician: Referring Nalleli Largent: Roosvelt MaserLANE, RACHEL Treating Decarlo Rivet/Extender: Linwood DibblesSTONE III, HOYT Weeks in Treatment: 3 Wound Status Wound Number: 3 Primary Etiology: Diabetic Wound/Ulcer of the Lower Extremity Wound Location: Left, Circumferential Toe Second Wound Status: Open Wounding Event: Gradually Appeared Comorbid History: Cataracts, Type II Diabetes, Neuropathy Date Acquired: 05/17/2020 Weeks Of Treatment: 3 Clustered Wound: No Photos Wound  Measurements Length: (cm) 0.5 % Re Width: (cm) 0.6 % Re Depth: (cm) 0.1 Area: (cm) 0.236 Volume: (cm) 0.024 duction in Area: 99.3% duction in Volume: 100% Wound Description Classification: Grade 2 Foul Wound Margin: Flat and Intact Slou Exudate Amount: Small Exudate Type: Serous Exudate Color: amber Odor After Cleansing: No gh/Fibrino Yes Wound Bed Granulation Amount: None Present (0%) Exposed Structure Necrotic Amount: Large (67-100%) Fascia Exposed: No Necrotic Quality: Adherent Slough Fat Layer (Subcutaneous Tissue) Exposed: Yes Tendon Exposed: No Muscle Exposed: No Joint Exposed: No Bone Exposed: No Assessment Notes second photo small crack at bSE OF TOE. HEled Treatment Notes Wound #3 (Left, Circumferential Toe Second) Notes scell, conform [place], Ricky ([id number]) Electronic Signature(s) Signed: 06/30/2020 6:25:29 PM By: Elliot GurneyWoody, BSN, RN, CWS, Kim RN, BSN Entered By: Elliot GurneyWoody, BSN, RN, CWS, Kim on 06/30/2020 15:15:58 Broom, Andrue ([id number]) -------------------------------------------------------------------------------- Vitals Details Patient Name: [name] LenzNORRIS, Hulan Date of Service: 06/30/2020 3:00 PM Medical Record Number: [id number] Patient Account Number: [id number] Date of Birth/Sex: 11/14/1961 (59 y.o. M) Treating RN: Huel CoventryWoody, Kim Primary Care Giuliano Preece: Roosvelt MaserLANE, RACHEL Other Clinician: Referring Jaice Lague: Roosvelt MaserLANE, RACHEL Treating Danita Proud/Extender: Linwood DibblesSTONE III, HOYT Weeks in Treatment: 3 Vital Signs Time Taken: 14:50 Temperature (F): 98.2 Height (in): 72 Pulse (bpm): 84 Weight (lbs): 249 Respiratory Rate (breaths/min): 16 Body Mass Index (BMI): 33.8 Blood Pressure (mmHg): 124/66 Reference Range: 80 - 120 mg / dl Electronic Signature(s) Signed: 06/30/2020 6:25:29 PM By: Elliot GurneyWoody, BSN, RN, CWS, Kim RN, BSN Entered By: Elliot GurneyWoody, BSN, RN, CWS, Kim on 06/30/2020 15:00:54

## 2020-07-03 DIAGNOSIS — I739 Peripheral vascular disease, unspecified: Secondary | ICD-10-CM | POA: Insufficient documentation

## 2020-07-03 NOTE — Progress Notes (Signed)
MRN : [id number]  [name] is a 59 y.o. (1961-02-21) male who presents with chief complaint of No chief complaint on file. Marland Kitchen  History of Present Illness:   The patient is seen for evaluation of circulation in association with ulcers.  He has struggled with DFU for a long time.  The patient denies rest pain or dangling of an extremity off the side of the bed during the night for relief. No open wounds or sores at this time. No prior interventions or surgeries.  No history of back problems or DJD of the lumbar sacral spine.   The patient denies changes in claudication symptoms or new rest pain symptoms.  No new ulcers or wounds of the foot.  The patient's blood pressure has been stable and relatively well controlled. The patient denies amaurosis fugax or recent TIA symptoms. There are no recent neurological changes noted. The patient denies history of DVT, PE or superficial thrombophlebitis. The patient denies recent episodes of angina or shortness of breath.   ABI's are normal bilaterally with triphasic Doppler signals bilaterally  No outpatient medications have been marked as taking for the 07/04/20 encounter (Appointment) with Delana Meyer, Dolores Lory, MD.    Past Medical History:  Diagnosis Date  . Arthritis    hands  . COVID-19 12/29/2019  . Diabetes mellitus without complication ([place])    type 2  . GERD (gastroesophageal reflux disease)   . Hypercholesterolemia   . Osteomyelitis of left foot [facility])     Past Surgical History:  Procedure Laterality Date  . ADENOIDECTOMY    . CATARACT EXTRACTION W/PHACO Right 03/03/2020   Procedure: CATARACT EXTRACTION PHACO AND INTRAOCULAR LENS PLACEMENT ([place]) RIGHT DIABETIC VISION BLUE;  Surgeon: Marchia Meiers, MD;  Location: [place];  Service: Ophthalmology;  Laterality: Right;  COVID ( + ) 12-29-2019  30.97 02:27.7  . CATARACT EXTRACTION W/PHACO Left 06/23/2020   Procedure: CATARACT EXTRACTION PHACO AND INTRAOCULAR LENS  PLACEMENT ([facility]) LEFT DIABETIC VISION BLUE 5.14  00:34.7;  Surgeon: Marchia Meiers, MD;  Location: [place];  Service: Ophthalmology;  Laterality: Left;  DIABETIC  . ELBOW FRACTURE SURGERY Right   . EXTERNAL EAR SURGERY      Social History Social History   Tobacco Use  . Smoking status: Never Smoker  . Smokeless tobacco: Never Used  Vaping Use  . Vaping Use: Never used  Substance Use Topics  . Alcohol use: Not Currently    Comment: occasionally  . Drug use: No    Family History Family History  Problem Relation Age of Onset  . Dementia Mother   . Hypertension Mother   . Diabetes Maternal Grandmother   . Hypertension Maternal Grandmother   . Cancer Neg Hx   . COPD Neg Hx   . Heart disease Neg Hx   . Stroke Neg Hx   No family history of bleeding/clotting disorders, porphyria or autoimmune disease   Allergies  Allergen Reactions  . Amoxicillin Rash     REVIEW OF SYSTEMS (Negative unless checked)  Constitutional: _0 Weight loss  _1 Fever  _2 Chills Cardiac: _3 Chest pain   _4 Chest pressure   _5 Palpitations   _6 Shortness of breath when laying flat   _7 Shortness of breath with exertion. Vascular:  _8 Pain in legs with walking   _9 Pain in legs at rest  _10 History of DVT   _11 Phlebitis   _12 Swelling in legs   _13 Varicose veins   _14 Non-healing ulcers Pulmonary:   _15 Uses home oxygen   _16 Productive cough   _17 Hemoptysis   _18   Wheeze  _0 COPD   _1 Asthma Neurologic:  _2 Dizziness   _3 Seizures   _4 History of stroke   _5 History of TIA  _6 Aphasia   _7 Vissual changes   _8 Weakness or numbness in arm   _9 Weakness or numbness in leg Musculoskeletal:   _10 Joint swelling   _11 Joint pain   _12 Low back pain Hematologic:  _13 Easy bruising  _14 Easy bleeding   _15 Hypercoagulable state   _16 Anemic Gastrointestinal:  _17 Diarrhea   _18 Vomiting  _19 Gastroesophageal reflux/heartburn   _20 Difficulty swallowing. Genitourinary:  _21 Chronic kidney disease   _22 Difficult urination  _23 Frequent urination   _24 Blood in  urine Skin:  _25 Rashes   _26 Ulcers  Psychological:  _27 History of anxiety   _28  History of major depression.  Physical Examination  There were no vitals filed for this visit. There is no height or weight on file to calculate BMI. Gen: WD/WN, NAD Head: [place]/AT, No temporalis wasting.  Ear/Nose/Throat: Hearing grossly intact, nares w/o erythema or drainage, poor dentition Eyes: PER, EOMI, sclera nonicteric.  Neck: Supple, no masses.  No bruit or JVD.  Pulmonary:  Good air movement, clear to auscultation bilaterally, no use of accessory muscles.  Cardiac: RRR, normal S1, S2, no Murmurs. Vascular: Ulcer associated with callous left great and 2nd toe, ulcer under 5th met head all clean and noninfected.  scattered varicosities present bilaterally.  Moderate venous stasis changes to the legs bilaterally.  1+ soft pitting edema Vessel Right Left  Radial Palpable Palpable  PT Palpable Palpable  DP Palpable Palpable  Gastrointestinal: soft, non-distended. No guarding/no peritoneal signs.  Musculoskeletal: M/S 5/5 throughout.  No deformity or atrophy.  Neurologic: CN 2-12 intact. Pain and light touch intact in extremities.  Symmetrical.  Speech is fluent. Motor exam as listed above. Psychiatric: Judgment intact, Mood & affect appropriate for pt's clinical situation. Dermatologic: Moderate venous rashes no venous ulcers noted.  No changes consistent with cellulitis. Lymph : No Cervical lymphadenopathy, no lichenification or skin changes of chronic lymphedema.  CBC Lab Results  Component Value Date   WBC 6.9 12/24/2018   HGB 13.6 12/24/2018   HCT 39.0 12/24/2018   MCV 91 12/24/2018   PLT 205 12/24/2018    BMET    Component Value Date/Time   NA 134 06/04/2019 1645   K 4.7 06/04/2019 1645   CL 97 06/04/2019 1645   CO2 21 06/04/2019 1645   GLUCOSE 362 (H) 06/04/2019 1645   GLUCOSE 236 (H) 07/19/2017 1903   BUN 25 (H) 06/04/2019 1645   CREATININE 0.89 06/04/2019 1645   CALCIUM 9.5  06/04/2019 1645   GFRNONAA 96 06/04/2019 1645   GFRAA 110 06/04/2019 1645   CrCl cannot be calculated (Patient's most recent lab result is older than the maximum 21 days allowed.).  COAG No results found for: INR, PROTIME  Radiology DG Foot Complete Left  Result Date: 06/07/2020 CLINICAL DATA:  Osteomyelitis infection first and second toe EXAM: LEFT FOOT - COMPLETE 3+ VIEW COMPARISON:  07/19/2017 FINDINGS: No acute displaced fracture or malalignment. Ulcer of the great toe at the level of the IP joint. Mild bony destructive change at the tuft of the second distal phalanx. Wound or ulcer with soft tissue swelling of the second digit. Large plantar calcaneal spur with adjacent calcifications. IMPRESSION: 1. Mild bony destructive change at the tuft of the second distal phalanx concerning for osteomyelitis. 2. Ulcer of the great toe at the level of the IP joint. Electronically Signed   By: Donavan Foil M.D.   On: 06/07/2020 20:43     Assessment/Plan  1. Diabetic ulcer of toe of left foot associated with type 2 diabetes mellitus, with fat layer exposed ([place]) Recommend:  I do not find evidence of life style limiting vascular disease. The patient specifically denies life style limitation.  Noninvasive studies including ABI's of the legs do not identify vascular problems.  The patient should continue walking and begin a more formal exercise program. The patient should continue his antiplatelet therapy and aggressive treatment of the lipid abnormalities.  The patient should begin wearing graduated compression socks 15-20 mmHg strength to control his mild edema and venous insufficiency.  Patient will follow-up with me on a PRN basis   2. Chronic venous insufficiency Recommend:  I do not find evidence of life style limiting vascular disease. The patient specifically denies life style limitation.  Noninvasive studies including ABI's of the legs do not identify vascular problems.  The  patient should continue walking and begin a more formal exercise program. The patient should continue his antiplatelet therapy and aggressive treatment of the lipid abnormalities.  The patient should begin wearing graduated compression socks 15-20 mmHg strength to control his mild edema and venous insufficiency.  Patient will follow-up with me on a PRN basis   3. Type 2 diabetes mellitus with hyperglycemia, without long-term current use of insulin ([facility]) Continue hypoglycemic medications as already ordered, these medications have been reviewed and there are no changes at this time.  Hgb A1C to be monitored as already arranged by primary service   4. Hyperlipidemia associated with type 2 diabetes mellitus ([place]) Continue statin as ordered and reviewed, no changes at this time    Hortencia Pilar, MD  07/03/2020 11:35 AM

## 2020-07-04 ENCOUNTER — Encounter (INDEPENDENT_AMBULATORY_CARE_PROVIDER_SITE_OTHER): Payer: Self-pay | Admitting: Vascular Surgery

## 2020-07-04 ENCOUNTER — Other Ambulatory Visit (INDEPENDENT_AMBULATORY_CARE_PROVIDER_SITE_OTHER): Payer: Self-pay | Admitting: Physician Assistant

## 2020-07-04 ENCOUNTER — Other Ambulatory Visit: Payer: Self-pay

## 2020-07-04 ENCOUNTER — Ambulatory Visit (INDEPENDENT_AMBULATORY_CARE_PROVIDER_SITE_OTHER): Payer: BC Managed Care – PPO

## 2020-07-04 ENCOUNTER — Ambulatory Visit (INDEPENDENT_AMBULATORY_CARE_PROVIDER_SITE_OTHER): Payer: BC Managed Care – PPO | Admitting: Vascular Surgery

## 2020-07-04 VITALS — BP 129/77 | HR 82 | Resp 16 | Ht 72.0 in | Wt 240.0 lb

## 2020-07-04 DIAGNOSIS — L819 Disorder of pigmentation, unspecified: Secondary | ICD-10-CM

## 2020-07-04 DIAGNOSIS — E11621 Type 2 diabetes mellitus with foot ulcer: Secondary | ICD-10-CM

## 2020-07-04 DIAGNOSIS — E1165 Type 2 diabetes mellitus with hyperglycemia: Secondary | ICD-10-CM | POA: Diagnosis not present

## 2020-07-04 DIAGNOSIS — E785 Hyperlipidemia, unspecified: Secondary | ICD-10-CM

## 2020-07-04 DIAGNOSIS — I872 Venous insufficiency (chronic) (peripheral): Secondary | ICD-10-CM | POA: Diagnosis not present

## 2020-07-04 DIAGNOSIS — S91109A Unspecified open wound of unspecified toe(s) without damage to nail, initial encounter: Secondary | ICD-10-CM

## 2020-07-04 DIAGNOSIS — L97522 Non-pressure chronic ulcer of other part of left foot with fat layer exposed: Secondary | ICD-10-CM

## 2020-07-04 DIAGNOSIS — E1169 Type 2 diabetes mellitus with other specified complication: Secondary | ICD-10-CM | POA: Diagnosis not present

## 2020-07-07 DIAGNOSIS — M86272 Subacute osteomyelitis, left ankle and foot: Secondary | ICD-10-CM | POA: Diagnosis not present

## 2020-07-07 DIAGNOSIS — M869 Osteomyelitis, unspecified: Secondary | ICD-10-CM | POA: Diagnosis not present

## 2020-07-12 ENCOUNTER — Other Ambulatory Visit: Payer: Self-pay

## 2020-07-12 ENCOUNTER — Encounter: Payer: BC Managed Care – PPO | Attending: Physician Assistant | Admitting: Physician Assistant

## 2020-07-12 DIAGNOSIS — E1140 Type 2 diabetes mellitus with diabetic neuropathy, unspecified: Secondary | ICD-10-CM | POA: Diagnosis not present

## 2020-07-12 DIAGNOSIS — L97512 Non-pressure chronic ulcer of other part of right foot with fat layer exposed: Secondary | ICD-10-CM | POA: Insufficient documentation

## 2020-07-12 DIAGNOSIS — E11621 Type 2 diabetes mellitus with foot ulcer: Secondary | ICD-10-CM | POA: Diagnosis not present

## 2020-07-12 DIAGNOSIS — L97522 Non-pressure chronic ulcer of other part of left foot with fat layer exposed: Secondary | ICD-10-CM | POA: Diagnosis not present

## 2020-07-12 NOTE — Progress Notes (Addendum)
TRYONE, KILLE ([id number]) Visit Report for 07/12/2020 Arrival Information Details Patient Name: [name], [name] Date of Service: 07/12/2020 3:30 PM Medical Record Number: [id number] Patient Account Number: [id number] Date of Birth/Sex: 15-May-1961 (59 y.o. M) Treating RN: Cornell Barman Primary Care Lahari Suttles: Merrie Roof Other Clinician: Referring Hazell Siwik: Merrie Roof Treating Nazaire Cordial/Extender: Melburn Hake, HOYT Weeks in Treatment: 5 Visit Information History Since Last Visit Added or deleted any medications: Yes Patient Arrived: Ambulatory Any new allergies or adverse reactions: No Arrival Time: 16:14 Had a fall or experienced change in No Accompanied By: self activities of daily living that may affect Transfer Assistance: None risk of falls: Patient Identification Verified: Yes Signs or symptoms of abuse/neglect since last visito No Secondary Verification Process Completed: Yes Hospitalized since last visit: No Patient Requires Transmission-Based Precautions: No Implantable device outside of the clinic excluding No Patient Has Alerts: Yes cellular tissue based products placed in the center Patient Alerts: Type II Diabetic since last visit: Has Dressing in Place as Prescribed: Yes Pain Present Now: No Electronic Signature(s) Signed: 07/12/2020 5:14:51 PM By: Darci Needle Entered By: Darci Needle on 07/12/2020 16:30:27 Dismore, Bralin ([id number]) -------------------------------------------------------------------------------- [name] Date of Service: 07/12/2020 3:30 PM Medical Record Number: [id number] Patient Account Number: [id number] Date of Birth/Sex: August 17, 1961 (59 y.o. M) Treating RN: Cornell Barman Primary Care Saifullah Jolley: Merrie Roof Other Clinician: Referring Novaleigh Kohlman: Merrie Roof Treating Estreya Clay/Extender: Melburn Hake, HOYT Weeks in Treatment: 5 Encounter Discharge Information Items Post Procedure  Vitals Discharge Condition: Stable Unable to obtain vitals Reason: oversight Ambulatory Status: Ambulatory Discharge Destination: Home Transportation: Private Auto Accompanied By: self Schedule Follow-up Appointment: No Clinical Summary of Care: Electronic Signature(s) Signed: 07/12/2020 6:58:13 PM By: Gretta Cool, BSN, RN, CWS, Kim RN, BSN Entered By: Gretta Cool, BSN, RN, CWS, Kim on 07/12/2020 18:58:13 Talerico, Adrian ([id number]) -------------------------------------------------------------------------------- Lower Extremity Assessment Details Patient Name: [name] Date of Service: 07/12/2020 3:30 PM Medical Record Number: [id number] Patient Account Number: [id number] Date of Birth/Sex: 1961-03-06 (59 y.o. M) Treating RN: Cornell Barman Primary Care Doris Mcgilvery: Merrie Roof Other Clinician: Referring Aaleyah Witherow: Merrie Roof Treating Tiarna Koppen/Extender: Melburn Hake, HOYT Weeks in Treatment: 5 Edema Assessment Assessed: [Left: No] [Right: No] Edema: [Left: No] [Right: No] Calf Left: Right: Point of Measurement: cm From Medial Instep 42 cm 41 cm Ankle Left: Right: Point of Measurement: cm From Medial Instep 25 cm 24 cm Vascular Assessment Pulses: Dorsalis Pedis Palpable: [Left:Yes] [Right:Yes] Electronic Signature(s) Signed: 07/12/2020 5:14:51 PM By: Darci Needle Signed: 07/12/2020 7:33:04 PM By: Gretta Cool, BSN, RN, CWS, Kim RN, BSN Entered By: Darci Needle on 07/12/2020 16:40:38 Scheirer, Morio ([id number]) -------------------------------------------------------------------------------- Multi Wound Chart Details Patient Name: [name] Date of Service: 07/12/2020 3:30 PM Medical Record Number: [id number] Patient Account Number: [id number] Date of Birth/Sex: 11-27-1961 (59 y.o. 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 Extremity Secondary Etiology: Diabetic Wound/Ulcer of the Lower N/A N/A Extremity Comorbid History: Cataracts, Type II Diabetes, Cataracts, Type II Diabetes, Cataracts, Type II Diabetes, Neuropathy Neuropathy Neuropathy Date Acquired: 05/17/2020 05/07/2020 05/17/2020 Weeks of Treatment: '5 5 5 ' Wound Status: Open Open Open  Measurements L x W x D (cm) 0.5x0.3x0.2 0.9x1x0.1 0.4x0.3x0.3 Area (cm) : 0.118 0.707 0.094 Volume (cm) : 0.024 0.071 0.028 % Reduction in Area: 50.00% 45.40% 99.70% % Reduction in Volume: 66.20% 72.60% 100.00% Starting Position 1 (o'clock): 12 Ending Position 1 (o'clock): 12 Maximum Distance 1 (cm): 0.6 Undermining: Yes No No Classification: Full Thickness Without Exposed Grade 1 Grade 2 Support Structures Exudate Amount: Medium Medium Small Exudate Type: Serosanguineous Serous Serosanguineous Exudate Color: red, brown amber red, brown Wound Margin: Flat and Intact Epibole Flat and Intact Granulation Amount: Large (67-100%) None Present (0%) Large (67-100%) Granulation Quality: Pink N/A Red Necrotic Amount: None Present (0%) Large (67-100%) None Present (0%) Exposed Structures: Fat Layer (Subcutaneous Tissue) Fat Layer (Subcutaneous Tissue) Fat Layer (Subcutaneous Tissue) Exposed: Yes Exposed: Yes Exposed: Yes Fascia: No Fascia: No Fascia: No Tendon: No Tendon: No Tendon: No Muscle: No Muscle: No Muscle: No Joint: No Joint: No Joint: No Bone: No Bone: No Bone: No Epithelialization: Small (1-33%) None Large (67-100%) Treatment  Notes Strassner, Alvan ([id number]) Electronic Signature(s) Signed: 07/12/2020 7:33:04 PM By: Gretta Cool, BSN, RN, CWS, Kim RN, BSN Entered By: Gretta Cool, BSN, RN, CWS, Kim on 07/12/2020 16:57:31 Swearengin, Deja ([id number]) -------------------------------------------------------------------------------- Multi-Disciplinary Care Plan Details Patient Name: [name] Date of Service: 07/12/2020 3:30 PM Medical Record Number: [id number] Patient Account Number: [id number] Date of Birth/Sex: [birth year] (59 y.o. M) Treating RN: Cornell Barman Primary Care Quavion Boule: Merrie Roof Other Clinician: Referring Estephani Popper: Merrie Roof Treating Arrietty Dercole/Extender: Melburn Hake, HOYT Weeks in Treatment: 5 Active Inactive Osteomyelitis Nursing Diagnoses: Potential for infection: osteomyelitis Goals: Diagnostic evaluation for osteomyelitis completed as ordered Date Initiated: 06/07/2020 Date Inactivated: 07/12/2020 Target Resolution Date: 07/06/2020 Goal Status: Met Patient/caregiver will verbalize understanding of disease process and disease management Date Initiated: 06/07/2020 Date Inactivated: 07/12/2020 Target Resolution Date: 07/06/2020 Goal Status: Met Patient's osteomyelitis will resolve Date Initiated: 06/07/2020 Target Resolution Date: 08/18/2020 Goal Status: Active Signs and symptoms for osteomyelitis will be recognized and promptly addressed Date Initiated: 06/07/2020 Date Inactivated: 07/12/2020 Target Resolution Date: 07/06/2020 Goal Status: Met Interventions: Assess for signs and symptoms of osteomyelitis resolution every visit Provide education on osteomyelitis Notes: Electronic Signature(s) Signed: 07/12/2020 7:33:04 PM By: Gretta Cool, BSN, RN, CWS, Kim RN, BSN Entered By: Gretta Cool, BSN, RN, CWS, Kim on 07/12/2020 [place], Yukio ([id number]) -------------------------------------------------------------------------------- Pain Assessment Details Patient Name: [name] Date of Service: 07/12/2020  3:30 PM Medical Record Number: [id number] Patient Account Number: [id number] Date of Birth/Sex: 1961-08-20 (59 y.o. M) Treating RN: Cornell Barman Primary Care Adley Mazurowski: Merrie Roof Other Clinician: Referring Kalub Morillo: Merrie Roof Treating Murdis Flitton/Extender: Melburn Hake, HOYT Weeks in Treatment: 5 Active Problems Location of Pain Severity and Description of Pain Patient Has Paino No Site Locations With Dressing Change: No Pain Management and Medication Current Pain Management: Electronic Signature(s) Signed: 07/12/2020 5:14:51 PM By: Darci Needle Signed: 07/12/2020 7:33:04 PM By: Gretta Cool, BSN, RN, CWS, Kim RN, BSN Entered By: Darci Needle on 07/12/2020 16:30:43 Bieler, Stirling ([id number]) -------------------------------------------------------------------------------- Patient/Caregiver Education Details Patient Name: [name] Date of Service: 07/12/2020 3:30 PM Medical Record Number: [id number] Patient Account Number: [id number] Date of Birth/Gender: 26-Jun-1961 (59 y.o. M) Treating RN: Cornell Barman Primary Care Physician: Merrie Roof Other Clinician: Referring Physician: Merrie Roof Treating Physician/Extender: Sharalyn Ink in Treatment: 5 Education Assessment Education Provided To: Patient Education Topics Provided Wound/Skin Impairment: Handouts: Caring for Your Ulcer Methods: Demonstration, Explain/Verbal Responses: State content correctly Electronic Signature(s) Signed: 07/12/2020 7:33:04 PM By: Gretta Cool, BSN, RN, CWS, Kim RN, BSN Entered By: Gretta Cool, BSN, RN,  CWS, Kim on 07/12/2020 18:56:42 Calloway, Nile ([id number]) -------------------------------------------------------------------------------- Wound Assessment Details Patient Name: MARINA, DESIRE Date of Service: 07/12/2020 3:30 PM Medical Record Number: [id number] Patient Account Number: [id number] Date of Birth/Sex: Nov 06, 1961 (59 y.o. M) Treating RN: Cornell Barman Primary Care Jawuan Robb: Merrie Roof Other  Clinician: Referring Rykker Coviello: Merrie Roof Treating Amyiah Gaba/Extender: Melburn Hake, HOYT Weeks in Treatment: 5 Wound Status Wound Number: 1 Primary Etiology: Trauma, Other Wound Location: Right, Plantar Foot Secondary Etiology: Diabetic Wound/Ulcer of the Lower Extremity Wounding Event: Trauma Wound Status: Open Date Acquired: 05/17/2020 Comorbid History: Cataracts, Type II Diabetes, Neuropathy Weeks Of Treatment: 5 Clustered Wound: No Photos Wound Measurements Length: (cm) 0.5 Width: (cm) 0.3 Depth: (cm) 0.2 Area: (cm) 0.118 Volume: (cm) 0.024 % Reduction in Area: 50% % Reduction in Volume: 66.2% Epithelialization: Small (1-33%) Tunneling: No Undermining: Yes Starting Position (o'clock): 12 Ending Position (o'clock): 12 Maximum Distance: (cm) 0.6 Wound Description Classification: Full Thickness Without Exposed Support Structu Wound Margin: Flat and Intact Exudate Amount: Medium Exudate Type: Serosanguineous Exudate Color: red, brown res Foul Odor After Cleansing: No Slough/Fibrino No Wound Bed Granulation Amount: Large (67-100%) Exposed Structure Granulation Quality: Pink Fascia Exposed: No Necrotic Amount: None Present (0%) Fat Layer (Subcutaneous Tissue) Exposed: Yes Tendon Exposed: No Muscle Exposed: No Joint Exposed: No Bone Exposed: No Treatment Notes Wound #1 (Right, Plantar Foot) Rapley, Cordero ([id number]) Notes silver alginate, gauze, conform Electronic Signature(s) Signed: 07/12/2020 5:14:51 PM By: Darci Needle Signed: 07/12/2020 7:33:04 PM By: Gretta Cool, BSN, RN, CWS, Kim RN, BSN Entered By: Darci Needle on 07/12/2020 16:46:27 Wescoat, Elzie ([id number]) -------------------------------------------------------------------------------- Wound Assessment Details Patient Name: [name] Date of Service: 07/12/2020 3:30 PM Medical Record Number: [id number] Patient Account Number: [id number] Date of Birth/Sex: 01-Mar-1961 (59 y.o. M) Treating RN:  Cornell Barman Primary Care Rylie Knierim: Merrie Roof Other Clinician: Referring Beyza Bellino: Merrie Roof Treating Bralin Garry/Extender: Melburn Hake, HOYT Weeks in Treatment: 5 Wound Status Wound Number: 2 Primary Etiology: Diabetic Wound/Ulcer of the Lower Extremity Wound Location: Left, Anterior Toe Great Wound Status: Open Wounding Event: Gradually Appeared Comorbid History: Cataracts, Type II Diabetes, Neuropathy Date Acquired: 05/07/2020 Weeks Of Treatment: 5 Clustered Wound: No Photos Wound Measurements Length: (cm) 0.9 % Width: (cm) 1 % Depth: (cm) 0.1 Ep Area: (cm) 0.707 T Volume: (cm) 0.071 U Reduction in Area: 45.4% Reduction in Volume: 72.6% ithelialization: None unneling: No ndermining: No Wound Description Classification: Grade 1 Fo Wound Margin: Epibole Sl Exudate Amount: Medium Exudate Type: Serous Exudate Color: amber ul Odor After Cleansing: No ough/Fibrino Yes Wound Bed Granulation Amount: None Present (0%) Exposed Structure Necrotic Amount: Large (67-100%) Fascia Exposed: No Necrotic Quality: Adherent Slough Fat Layer (Subcutaneous Tissue) Exposed: Yes Tendon Exposed: No Muscle Exposed: No Joint Exposed: No Bone Exposed: No Treatment Notes Wound #2 (Left, Anterior Toe Great) Notes silver alginate, gauze, conform Electronic Signature(s) Signed: 07/12/2020 5:14:51 PM By: Mauri Reading, Catrell ([id number]) Signed: 07/12/2020 7:33:04 PM By: Gretta Cool, BSN, RN, CWS, Kim RN, BSN Entered By: Darci Needle on 07/12/2020 16:46:54 Tillison, Kathy ([id number]) -------------------------------------------------------------------------------- Wound Assessment Details Patient Name: [name] Date of Service: 07/12/2020 3:30 PM Medical Record Number: [id number] Patient Account Number: [id number] Date of Birth/Sex: 1961-06-04 (59 y.o. M) Treating RN: Cornell Barman Primary Care Yetzali Weld: Merrie Roof Other Clinician: Referring Kohana Amble: Merrie Roof Treating  Jossie Smoot/Extender: Melburn Hake, HOYT Weeks in Treatment: 5 Wound Status Wound Number: 3 Primary Etiology: Diabetic Wound/Ulcer of the Lower Extremity Wound Location: Left, Circumferential Toe Second Wound Status: Open Wounding Event: Gradually Appeared Comorbid History: Cataracts, Type II Diabetes,  Neuropathy Date Acquired: 05/17/2020 Weeks Of Treatment: 5 Clustered Wound: No Photos Wound Measurements Length: (cm) 0.4 Width: (cm) 0.3 Depth: (cm) 0.3 Area: (cm) 0.094 Volume: (cm) 0.028 % Reduction in Area: 99.7% % Reduction in Volume: 100% Epithelialization: Large (67-100%) Tunneling: No Undermining: No Wound Description Classification: Grade 2 Wound Margin: Flat and Intact Exudate Amount: Small Exudate Type: Serosanguineous Exudate Color: red, brown Foul Odor After Cleansing: No Slough/Fibrino No Wound Bed Granulation Amount: Large (67-100%) Exposed Structure Granulation Quality: Red Fascia Exposed: No Necrotic Amount: None Present (0%) Fat Layer (Subcutaneous Tissue) Exposed: Yes Tendon Exposed: No Muscle Exposed: No Joint Exposed: No Bone Exposed: No Treatment Notes Wound #3 (Left, Circumferential Toe Second) Notes silver alginate, gauze, conform Electronic Signature(s) Signed: 07/12/2020 7:33:04 PM By: Gretta Cool, BSN, RN, CWS, Kim RN, BSN Runnells, Aubry ([id number]) Entered By: Gretta Cool, BSN, RN, CWS, Kim on 07/12/2020 16:53:59 Weng, Elim ([id number]) -------------------------------------------------------------------------------- Vitals Details Patient Name: [name] Date of Service: 07/12/2020 3:30 PM Medical Record Number: [id number] Patient Account Number: [id number] Date of Birth/Sex: [birth year] (59 y.o. M) Treating RN: Cornell Barman Primary Care Tamanika Heiney: Merrie Roof Other Clinician: Referring Ilamae Geng: Merrie Roof Treating Prentis Langdon/Extender: Melburn Hake, HOYT Weeks in Treatment: 5 Vital Signs Time Taken: 16:14 Temperature (F): 98.4 Height (in):  72 Pulse (bpm): 84 Weight (lbs): 249 Respiratory Rate (breaths/min): 16 Body Mass Index (BMI): 33.8 Blood Pressure (mmHg): 134/67 Reference Range: 80 - 120 mg / dl Electronic Signature(s) Signed: 07/12/2020 5:14:51 PM By: Darci Needle Entered By: Darci Needle on 07/12/2020 16:30:32

## 2020-07-12 NOTE — Progress Notes (Addendum)
[name], [name] ([id number]) Visit Report for 07/12/2020 Chief Complaint Document Details Patient Name: [name] Date of Service: 07/12/2020 3:30 PM Medical Record Number: [id number] Patient Account Number: [id number] Date of Birth/Sex: June 03, 1961 (59 y.o. M) Treating RN: Huel Coventry Primary Care Provider: Roosvelt Maser Other Clinician: Referring Provider: Roosvelt Maser Treating Provider/Extender: Linwood Dibbles, Sonny Poth Weeks in Treatment: 5 Information Obtained from: Patient Chief Complaint Left foot/toe ulcers Electronic Signature(s) Signed: 07/12/2020 3:55:58 PM By: Lenda Kelp PA-C Entered By: Lenda Kelp on 07/12/2020 15:55:58 [name] ([id number]) -------------------------------------------------------------------------------- Debridement Details Patient Name: [name] Date of Service: 07/12/2020 3:30 PM Medical Record Number: [id number] Patient Account Number: [id number] Date of Birth/Sex: March 11, 1961 (59 y.o. M) Treating RN: Huel Coventry Primary Care Provider: Roosvelt Maser Other Clinician: Referring Provider: Roosvelt Maser Treating Provider/Extender: Linwood Dibbles, Dalilah Curlin Weeks in Treatment: 5 Debridement Performed for Wound #1 Right,Plantar Foot Assessment: Performed By: Physician [name], Janella Rogala E., PA-C Debridement Type: Debridement Severity of Tissue Pre Debridement: Fat layer exposed Level of Consciousness (Pre- Awake and Alert procedure): Pre-procedure Verification/Time Out Yes - 16:45 Taken: Total Area Debrided (L x W): 0.5 (cm) x 0.3 (cm) = 0.15 (cm) Tissue and other material Viable, Non-Viable, Callus, Slough, Subcutaneous, Slough debrided: Level: Skin/Subcutaneous Tissue Debridement Description: Excisional Instrument: Curette Bleeding: Moderate Hemostasis Achieved: Pressure Response to Treatment: Procedure was tolerated well Level of Consciousness (Post- Awake and Alert procedure): Post Debridement Measurements of Total Wound Length: (cm)  0.5 Width: (cm) 0.3 Depth: (cm) 0.2 Volume: (cm) 0.024 Character of Wound/Ulcer Post Debridement: Stable Severity of Tissue Post Debridement: Fat layer exposed Post Procedure Diagnosis Same as Pre-procedure Electronic Signature(s) Signed: 07/12/2020 6:15:25 PM By: Lenda Kelp PA-C Signed: 07/12/2020 7:33:04 PM By: Elliot Gurney, BSN, RN, CWS, Kim RN, BSN Entered By: Elliot Gurney, BSN, RN, CWS, Kim on 07/12/2020 17:00:58 [name] ([id number]) -------------------------------------------------------------------------------- Debridement Details Patient Name: [name] Date of Service: 07/12/2020 3:30 PM Medical Record Number: [id number] Patient Account Number: [id number] Date of Birth/Sex: 1961-03-19 (59 y.o. M) Treating RN: Huel Coventry Primary Care Provider: Roosvelt Maser Other Clinician: Referring Provider: Roosvelt Maser Treating Provider/Extender: Linwood Dibbles, Demico Ploch Weeks in Treatment: 5 Debridement Performed for Wound #2 Left,Anterior Toe Great Assessment: Performed By: Physician [name], Evalynne Locurto E., PA-C Debridement Type: Debridement Severity of Tissue Pre Debridement: Fat layer exposed Level of Consciousness (Pre- Awake and Alert procedure): Pre-procedure Verification/Time Out Yes - 16:45 Taken: Total Area Debrided (L x W): 0.9 (cm) x 1 (cm) = 0.9 (cm) Tissue and other material Viable, Non-Viable, Callus, Slough, Subcutaneous, Slough debrided: Level: Skin/Subcutaneous Tissue Debridement Description: Excisional Instrument: Curette Bleeding: Moderate Hemostasis Achieved: Pressure Response to Treatment: Procedure was tolerated well Level of Consciousness (Post- Awake and Alert procedure): Post Debridement Measurements of Total Wound Length: (cm) 0.9 Width: (cm) 1 Depth: (cm) 0.2 Volume: (cm) 0.141 Character of Wound/Ulcer Post Debridement: Stable Severity of Tissue Post Debridement: Fat layer exposed Post Procedure Diagnosis Same as Pre-procedure Electronic  Signature(s) Signed: 07/12/2020 6:15:25 PM By: Lenda Kelp PA-C Signed: 07/12/2020 7:33:04 PM By: Elliot Gurney, BSN, RN, CWS, Kim RN, BSN Entered By: Elliot Gurney, BSN, RN, CWS, Kim on 07/12/2020 17:01:29 [name] ([id number]) -------------------------------------------------------------------------------- Debridement Details Patient Name: [name] Date of Service: 07/12/2020 3:30 PM Medical Record Number: [id number] Patient Account Number: [id number] Date of Birth/Sex: 1961/10/18 (59 y.o. M) Treating RN: Huel Coventry Primary Care Provider: Roosvelt Maser Other Clinician: Referring Provider: Roosvelt Maser Treating Provider/Extender: Linwood Dibbles, Nishtha Raider Weeks in Treatment: 5 Debridement Performed for Wound #3 Left,Circumferential Toe Second Assessment: Performed By: Physician  [name], Eloy Fehl E., PA-C Debridement Type: Debridement Severity of Tissue Pre Debridement: Fat layer exposed Level of Consciousness (Pre- Awake and Alert procedure): Pre-procedure Verification/Time Out Yes - 16:45 Taken: Total Area Debrided (L x W): 0.4 (cm) x 0.3 (cm) = 0.12 (cm) Tissue and other material Viable, Non-Viable, Callus, Slough, Subcutaneous, Slough debrided: Level: Skin/Subcutaneous Tissue Debridement Description: Excisional Instrument: Curette Bleeding: Moderate Hemostasis Achieved: Pressure Response to Treatment: Procedure was tolerated well Level of Consciousness (Post- Awake and Alert procedure): Post Debridement Measurements of Total Wound Length: (cm) 0.4 Width: (cm) 0.3 Depth: (cm) 0.3 Volume: (cm) 0.028 Character of Wound/Ulcer Post Debridement: Stable Severity of Tissue Post Debridement: Fat layer exposed Post Procedure Diagnosis Same as Pre-procedure Electronic Signature(s) Signed: 07/12/2020 6:15:25 PM By: Lenda Kelp PA-C Signed: 07/12/2020 7:33:04 PM By: Elliot Gurney, BSN, RN, CWS, Kim RN, BSN Entered By: Elliot Gurney, BSN, RN, CWS, Kim on 07/12/2020 17:02:17 [name]  ([id number]) -------------------------------------------------------------------------------- HPI Details Patient Name: [name] Date of Service: 07/12/2020 3:30 PM Medical Record Number: [id number] Patient Account Number: [id number] Date of Birth/Sex: 1961-09-04 (59 y.o. M) Treating RN: Huel Coventry Primary Care Provider: Roosvelt Maser Other Clinician: Referring Provider: Roosvelt Maser Treating Provider/Extender: Linwood Dibbles, Edrei Norgaard Weeks in Treatment: 5 History of Present Illness HPI Description: 06/10/2020 upon evaluation today patient appears to be doing poorly in regard to his right plantar foot, left great toe, and left second toe. Unfortunately he has significant wounds at these locations but especially in regard to the second toe which does have a lot of necrotic tissue over the distal portion of the toe. The great toe does have some necrotic tissue as well. With that being said the patient tells me that this has been going on for 1-2 months and seems to be getting worse especially in regard to the toe. He has not had any specific x-rays to identify obvious signs of osteomyelitis up to this point that something would likely get a different today as well. With that being said he also has not had any current arterial studies which I think is something else that we probably will look into performing at this point. He does have a history of diabetes mellitus type 2, and diabetic neuropathy. Currently he has been using normal shoes for ambulation though he does have some kind of external device so that he does not have to have actual still toed boots anymore at work which he states has helped nonetheless we is at work he does have to have something that protects his feet as such per policy. He tells me he is having to do a lot more walking right now compared to normal but he can try to see if he can change something in that regard. 06/16/2020 upon evaluation today patient appears to be doing well all  things considered with regard to his wounds. I do not feel like anything is it any worse and overall I feel like the Bactrim has done well. The culture that I reviewed did not reveal any specific organisms unfortunately that would help tailor treatment. I did actually review the x-ray as well it does appear that he has evidence of osteomyelitis of the distal tuft of the second toe left foot. That was discussed with him today. He likely will need a referral to infectious disease. 06/30/2020 on evaluation today patient appears to be doing well at this time in regard to his wounds all things considered. The erythema has improved he does have evidence of osteomyelitis again the second  toe on the left foot but at the other sites there were no obvious signs. He does have wounds bilaterally on his feet and toes. These are to require sharp debridement today. He is still pending as far as his appointment with infectious disease. With that being said this is actually scheduled for 29 July which is this month. He also has an appointment on the 27th with vascular. Overall at least we are getting to the point where I think we are getting something is taken care of here. 07/12/2020 patient did have his appointment with [facility] infectious disease. They have placed him on medications which include Flagyl 500 mg and Levaquin 750 mg for the next several weeks until September 9. Other than that he overall seems to be looking quite well and I am very pleased with how things are progressing at this point. Electronic Signature(s) Signed: 07/12/2020 5:29:03 PM By: Lenda Kelp PA-C Entered By: Lenda Kelp on 07/12/2020 17:29:02 Elkhatib, Holdyn ([id number]) -------------------------------------------------------------------------------- Physical Exam Details Patient Name: [name] Date of Service: 07/12/2020 3:30 PM Medical Record Number: [id number] Patient Account Number: [id number] Date of Birth/Sex: 1961-02-14 (59  y.o. M) Treating RN: Huel Coventry Primary Care Provider: Roosvelt Maser Other Clinician: Referring Provider: Roosvelt Maser Treating Provider/Extender: Linwood Dibbles, Sakoya Win Weeks in Treatment: 5 Constitutional Well-nourished and well-hydrated in no acute distress. Respiratory normal breathing without difficulty. Psychiatric this patient is able to make decisions and demonstrates good insight into disease process. Alert and Oriented x 3. pleasant and cooperative. Notes Patient's wound bed currently showed signs of good granulation at this time there does not appear to be any evidence of active infection which is great news and overall I think that the antibiotics are doing the job. With that being said I do believe that the patient is still continuing to improve little by little which is exactly what we want to see. I did perform sharp debridement to clear away some of the callus, slough, and necrotic debris from the surface of the wound. Post debridement wound bed appears to be doing much better. Electronic Signature(s) Signed: 07/12/2020 5:38:14 PM By: Lenda Kelp PA-C Entered By: Lenda Kelp on 07/12/2020 17:38:14 Klaas, Timo ([id number]) -------------------------------------------------------------------------------- Physician Orders Details Patient Name: [name] Date of Service: 07/12/2020 3:30 PM Medical Record Number: [id number] Patient Account Number: [id number] Date of Birth/Sex: [birth year] (59 y.o. M) Treating RN: Huel Coventry Primary Care Provider: Roosvelt Maser Other Clinician: Referring Provider: Roosvelt Maser Treating Provider/Extender: Linwood Dibbles, Chelsa Stout Weeks in Treatment: 5 Verbal / Phone Orders: No Diagnosis Coding ICD-10 Coding Code Description E11.621 Type 2 diabetes mellitus with foot ulcer 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.528 Non-pressure chronic ulcer of other part of left foot with other specified  severity E11.40 Type 2 diabetes mellitus with diabetic neuropathy, unspecified Wound Cleansing Wound #1 Right,Plantar Foot o Clean wound with Normal Saline. Wound #2 Left,Anterior Toe Great o Clean wound with Normal Saline. Wound #3 Left,Circumferential Toe Second o Clean wound with Normal Saline. Primary Wound Dressing Wound #1 Right,Plantar Foot o Silver Alginate Wound #2 Left,Anterior Toe Great o Silver Alginate Wound #3 Left,Circumferential Toe Second o Silver Alginate Secondary Dressing Wound #1 Right,Plantar Foot o Gauze and Kerlix/Conform Wound #2 Left,Anterior Toe Great o Gauze and Kerlix/Conform Wound #3 Left,Circumferential Toe Second o Gauze and Kerlix/Conform Dressing Change Frequency Wound #1 Right,Plantar Foot o Change Dressing Monday, Wednesday, Friday Wound #2 Left,Anterior Toe Great o Change Dressing Monday, Wednesday, Friday  Wound #3 Left,Circumferential Toe Second o Change Dressing Monday, Wednesday, Friday Follow-up Appointments Wound #1 Right,Plantar Foot o Return Appointment in 1 week. Wound #2 Left,Anterior Toe Great o Return Appointment in 1 week. Wound #3 Left,Circumferential Toe Second Cassidy, Ramzey ([id number]) o Return Appointment in 1 week. Edema Control Wound #1 Right,Plantar Foot o Elevate legs to the level of the heart and pump ankles as often as possible Wound #2 Left,Anterior Toe Great o Elevate legs to the level of the heart and pump ankles as often as possible Wound #3 Left,Circumferential Toe Second o Elevate legs to the level of the heart and pump ankles as often as possible Off-Loading Wound #1 Right,Plantar Foot o Open toe surgical shoe Wound #2 Left,Anterior Toe Great o Open toe surgical shoe Wound #3 Left,Circumferential Toe Second o Open toe surgical shoe Additional Orders / Instructions Wound #1 Right,Plantar Foot o Increase protein intake. Wound #2 Left,Anterior Toe  Great o Increase protein intake. Wound #3 Left,Circumferential Toe Second o Increase protein intake. Medications-please add to medication list. Wound #1 Right,Plantar Foot o P.O. Antibiotics - Levaquin and Flagyl Wound #2 Left,Anterior Toe Great o P.O. Antibiotics - Levaquin and Flagyl Wound #3 Left,Circumferential Toe Second o P.O. Antibiotics - Levaquin and Flagyl Electronic Signature(s) Signed: 07/12/2020 6:15:25 PM By: Lenda Kelp PA-C Signed: 07/12/2020 7:33:04 PM By: Elliot Gurney, BSN, RN, CWS, Kim RN, BSN Entered By: Elliot Gurney, BSN, RN, CWS, Kim on 07/12/2020 16:59:30 Heuberger, Jj ([id number]) -------------------------------------------------------------------------------- Problem List Details Patient Name: [name] Date of Service: 07/12/2020 3:30 PM Medical Record Number: [id number] Patient Account Number: [id number] Date of Birth/Sex: January 04, 1961 (59 y.o. M) Treating RN: Huel Coventry Primary Care Provider: Roosvelt Maser Other Clinician: Referring Provider: Roosvelt Maser Treating Provider/Extender: Lenda Kelp Weeks in Treatment: 5 Active Problems ICD-10 Encounter Code Description Active Date MDM Diagnosis E11.621 Type 2 diabetes mellitus with foot ulcer 06/07/2020 No Yes L97.522 Non-pressure chronic ulcer of other part of left foot with fat layer 06/07/2020 No Yes exposed L97.528 Non-pressure chronic ulcer of other part of left foot with other specified 06/07/2020 No Yes severity E11.40 Type 2 diabetes mellitus with diabetic neuropathy, unspecified 06/07/2020 No Yes Inactive Problems Resolved Problems Electronic Signature(s) Signed: 07/12/2020 3:55:47 PM By: Lenda Kelp PA-C Entered By: Lenda Kelp on 07/12/2020 15:55:47 Rosier, Dailon ([id number]) -------------------------------------------------------------------------------- Progress Note Details Patient Name: [name] Date of Service: 07/12/2020 3:30 PM Medical Record Number: [id number] Patient  Account Number: [id number] Date of Birth/Sex: 17-Nov-1961 (59 y.o. M) Treating RN: Huel Coventry Primary Care Provider: Roosvelt Maser Other Clinician: Referring Provider: Roosvelt Maser Treating Provider/Extender: Linwood Dibbles, Denna Fryberger Weeks in Treatment: 5 Subjective Chief Complaint Information obtained from Patient Left foot/toe ulcers History of Present Illness (HPI) 06/10/2020 upon evaluation today patient appears to be doing poorly in regard to his right plantar foot, left great toe, and left second toe. Unfortunately he has significant wounds at these locations but especially in regard to the second toe which does have a lot of necrotic tissue over the distal portion of the toe. The great toe does have some necrotic tissue as well. With that being said the patient tells me that this has been going on for 1-2 months and seems to be getting worse especially in regard to the toe. He has not had any specific x-rays to identify obvious signs of osteomyelitis up to this point that something would likely get a different today as well. With that being said he also has not had any current arterial studies which I  think is something else that we probably will look into performing at this point. He does have a history of diabetes mellitus type 2, and diabetic neuropathy. Currently he has been using normal shoes for ambulation though he does have some kind of external device so that he does not have to have actual still toed boots anymore at work which he states has helped nonetheless we is at work he does have to have something that protects his feet as such per policy. He tells me he is having to do a lot more walking right now compared to normal but he can try to see if he can change something in that regard. 06/16/2020 upon evaluation today patient appears to be doing well all things considered with regard to his wounds. I do not feel like anything is it any worse and overall I feel like the Bactrim has done well.  The culture that I reviewed did not reveal any specific organisms unfortunately that would help tailor treatment. I did actually review the x-ray as well it does appear that he has evidence of osteomyelitis of the distal tuft of the second toe left foot. That was discussed with him today. He likely will need a referral to infectious disease. 06/30/2020 on evaluation today patient appears to be doing well at this time in regard to his wounds all things considered. The erythema has improved he does have evidence of osteomyelitis again the second toe on the left foot but at the other sites there were no obvious signs. He does have wounds bilaterally on his feet and toes. These are to require sharp debridement today. He is still pending as far as his appointment with infectious disease. With that being said this is actually scheduled for 29 July which is this month. He also has an appointment on the 27th with vascular. Overall at least we are getting to the point where I think we are getting something is taken care of here. 07/12/2020 patient [facility] They have placed him on medications which include Flagyl 500 mg and Levaquin 750 mg for the next several weeks until September 9. Other than that he overall seems to be looking quite well and I am very pleased with how things are progressing at this point. Objective Constitutional Well-nourished and well-hydrated in no acute distress. Vitals Time Taken: 4:14 PM, Height: 72 in, Weight: 249 lbs, BMI: 33.8, Temperature: 98.4 F, Pulse: 84 bpm, Respiratory Rate: 16 breaths/min, Blood Pressure: 134/67 mmHg. Respiratory normal breathing without difficulty. Psychiatric this patient is able to make decisions and demonstrates good insight into disease process. Alert and Oriented x 3. pleasant and cooperative. General Notes: Patient's wound bed currently showed signs of good granulation at this time there does not appear  to be any evidence of active infection which is great news and overall I think that the antibiotics are doing the job. With that being said I do believe that the patient is still continuing to improve little by little which is exactly what we want to see. I did perform sharp debridement to clear away some of the callus, slough, and necrotic debris from the surface of the wound. Post debridement wound bed appears to be doing much better. Integumentary (Hair, Skin) Wound #1 status is Open. Original cause of wound was Trauma. The wound is located on the Right,Plantar Foot. The wound measures 0.5cm length x 0.3cm width x 0.2cm depth; 0.118cm^2 area and 0.024cm^3 volume. There is Fat Layer (Subcutaneous  Tissue) Exposed exposed. Saravia, Leeland ([id number]) There is no tunneling noted, however, there is undermining starting at 12:00 and ending at 12:00 with a maximum distance of 0.6cm. There is a medium amount of serosanguineous drainage noted. The wound margin is flat and intact. There is large (67-100%) pink granulation within the wound bed. There is no necrotic tissue within the wound bed. Wound #2 status is Open. Original cause of wound was Gradually Appeared. The wound is located on the [employer]. The wound measures 0.9cm length x 1cm width x 0.1cm depth; 0.707cm^2 area and 0.071cm^3 volume. There is Fat Layer (Subcutaneous Tissue) Exposed exposed. There is no tunneling or undermining noted. There is a medium amount of serous drainage noted. The wound margin is epibole. There is no granulation within the wound bed. There is a large (67-100%) amount of necrotic tissue within the wound bed including Adherent Slough. Wound #3 status is Open. Original cause of wound was Gradually Appeared. The wound is located on the Left,Circumferential Toe Second. The wound measures 0.4cm length x 0.3cm width x 0.3cm depth; 0.094cm^2 area and 0.028cm^3 volume. There is Fat Layer (Subcutaneous  Tissue) Exposed exposed. There is no tunneling or undermining noted. There is a small amount of serosanguineous drainage noted. The wound margin is flat and intact. There is large (67-100%) red granulation within the wound bed. There is no necrotic tissue within the wound bed. Assessment Active Problems ICD-10 Type 2 diabetes mellitus with foot ulcer Non-pressure chronic ulcer of other part of left foot with fat layer exposed Non-pressure chronic ulcer of other part of left foot with other specified severity Type 2 diabetes mellitus with diabetic neuropathy, unspecified Procedures Wound #1 Pre-procedure diagnosis of Wound #1 is a Trauma, Other located on the Right,Plantar Foot .Severity of Tissue Pre Debridement is: Fat layer exposed. There was a Excisional Skin/Subcutaneous Tissue Debridement with a total area of 0.15 sq cm performed by [name], Gil Ingwersen E., PA-C. With the following instrument(s): Curette to remove Viable and Non-Viable tissue/material. Material removed includes Callus, Subcutaneous Tissue, and Slough. No specimens were taken. A time out was conducted at 16:45, prior to the start of the procedure. A Moderate amount of bleeding was controlled with Pressure. The procedure was tolerated well. Post Debridement Measurements: 0.5cm length x 0.3cm width x 0.2cm depth; 0.024cm^3 volume. Character of Wound/Ulcer Post Debridement is stable. Severity of Tissue Post Debridement is: Fat layer exposed. Post procedure Diagnosis Wound #1: Same as Pre-Procedure Wound #2 Pre-procedure diagnosis of Wound #2 is a Diabetic Wound/Ulcer of the Lower Extremity located on the Left,Anterior Toe Great .Severity of Tissue Pre Debridement is: Fat layer exposed. There was a Excisional Skin/Subcutaneous Tissue Debridement with a total area of 0.9 sq cm performed by [name], Advith Martine E., PA-C. With the following instrument(s): Curette to remove Viable and Non-Viable tissue/material. Material removed includes  Callus, Subcutaneous Tissue, and Slough. No specimens were taken. A time out was conducted at 16:45, prior to the start of the procedure. A Moderate amount of bleeding was controlled with Pressure. The procedure was tolerated well. Post Debridement Measurements: 0.9cm length x 1cm width x 0.2cm depth; 0.141cm^3 volume. Character of Wound/Ulcer Post Debridement is stable. Severity of Tissue Post Debridement is: Fat layer exposed. Post procedure Diagnosis Wound #2: Same as Pre-Procedure Wound #3 Pre-procedure diagnosis of Wound #3 is a Diabetic Wound/Ulcer of the Lower Extremity located on the Left,Circumferential Toe Second .Severity of Tissue Pre Debridement is: Fat layer exposed. There was a Excisional Skin/Subcutaneous Tissue Debridement  with a total area of 0.12 sq cm performed by [name], Takiyah Bohnsack E., PA-C. With the following instrument(s): Curette to remove Viable and Non-Viable tissue/material. Material removed includes Callus, Subcutaneous Tissue, and Slough. No specimens were taken. A time out was conducted at 16:45, prior to the start of the procedure. A Moderate amount of bleeding was controlled with Pressure. The procedure was tolerated well. Post Debridement Measurements: 0.4cm length x 0.3cm width x 0.3cm depth; 0.028cm^3 volume. Character of Wound/Ulcer Post Debridement is stable. Severity of Tissue Post Debridement is: Fat layer exposed. Post procedure Diagnosis Wound #3: Same as Pre-Procedure Plan Wound Cleansing: Wound #1 Right,Plantar Foot: Banik, Eastin ([id number]) Clean wound with Normal Saline. Wound #2 Left,Anterior Toe Great: Clean wound with Normal Saline. Wound #3 Left,Circumferential Toe Second: Clean wound with Normal Saline. Primary Wound Dressing: Wound #1 Right,Plantar Foot: Silver Alginate Wound #2 Left,Anterior Toe Great: Silver Alginate Wound #3 Left,Circumferential Toe Second: Silver Alginate Secondary Dressing: Wound #1 Right,Plantar Foot: Gauze and  Kerlix/Conform Wound #2 Left,Anterior Toe Great: Gauze and Kerlix/Conform Wound #3 Left,Circumferential Toe Second: Gauze and Kerlix/Conform Dressing Change Frequency: Wound #1 Right,Plantar Foot: Change Dressing Monday, Wednesday, Friday Wound #2 Left,Anterior Toe Great: Change Dressing Monday, Wednesday, Friday Wound #3 Left,Circumferential Toe Second: Change Dressing Monday, Wednesday, Friday Follow-up Appointments: Wound #1 Right,Plantar Foot: Return Appointment in 1 week. Wound #2 Left,Anterior Toe Great: Return Appointment in 1 week. Wound #3 Left,Circumferential Toe Second: Return Appointment in 1 week. Edema Control: Wound #1 Right,Plantar Foot: Elevate legs to the level of the heart and pump ankles as often as possible Wound #2 Left,Anterior Toe Great: Elevate legs to the level of the heart and pump ankles as often as possible Wound #3 Left,Circumferential Toe Second: Elevate legs to the level of the heart and pump ankles as often as possible Off-Loading: Wound #1 Right,Plantar Foot: Open toe surgical shoe Wound #2 Left,Anterior Toe Great: Open toe surgical shoe Wound #3 Left,Circumferential Toe Second: Open toe surgical shoe Additional Orders / Instructions: Wound #1 Right,Plantar Foot: Increase protein intake. Wound #2 Left,Anterior Toe Great: Increase protein intake. Wound #3 Left,Circumferential Toe Second: Increase protein intake. Medications-please add to medication list.: Wound #1 Right,Plantar Foot: P.O. Antibiotics - Levaquin and Flagyl Wound #2 Left,Anterior Toe Great: P.O. Antibiotics - Levaquin and Flagyl Wound #3 Left,Circumferential Toe Second: P.O. Antibiotics - Levaquin and Flagyl 1. I would recommend currently that we continue with the silver alginate dressing I think that still the best way to go. 2. I am also can recommend that we going to continue with the postop shoes I think that those are helpful as far as try to keep pressure off  as well. 3. The patient should continue taking the Flagyl and Levaquin as prescribed by [facility]. We will see patient back for reevaluation in 1 week here in the clinic. If anything worsens or changes patient will contact our office for additional recommendations. Electronic Signature(s) Signed: 07/12/2020 5:38:43 PM By: Lenda Kelp PA-C Entered By: Lenda Kelp on 07/12/2020 17:38:43 Keady, Raliegh ([id number]) WIMER, Aarian ([id number]) -------------------------------------------------------------------------------- SuperBill Details Patient Name: [name] Date of Service: 07/12/2020 Medical Record Number: [id number] Patient Account Number: [id number] Date of Birth/Sex: November 02, 1961 (59 y.o. 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.528 Non-pressure chronic ulcer of other part of left foot with other specified severity E11.40 Type 2 diabetes mellitus with diabetic neuropathy, unspecified Facility Procedures CPT4 Code: 81829937 Description: 11042 - DEB SUBQ TISSUE 20 SQ CM/< Modifier: Quantity: 1 CPT4 Code: Description: ICD-10 Diagnosis Description L97.522 Non-pressure chronic ulcer of other part of left foot with fat layer expos L97.528 Non-pressure chronic ulcer of other part of left foot with other specified Modifier: ed severity Quantity: Physician Procedures CPT4 Code: 1696789 Description: 11042 - WC PHYS SUBQ TISS 20 SQ CM Modifier: Quantity: 1 CPT4 Code: Description: ICD-10 Diagnosis Description L97.522 Non-pressure chronic ulcer of other part of left foot with fat layer expos L97.528 Non-pressure chronic ulcer of other part of  left foot with other specified Modifier: ed severity Quantity: Electronic Signature(s) Signed: 07/12/2020 5:38:56 PM By: Lenda Kelp PA-C Entered By: Lenda Kelp on 07/12/2020 17:38:55

## 2020-07-21 ENCOUNTER — Other Ambulatory Visit: Payer: Self-pay

## 2020-07-21 ENCOUNTER — Encounter: Payer: BC Managed Care – PPO | Admitting: Physician Assistant

## 2020-07-21 DIAGNOSIS — E11621 Type 2 diabetes mellitus with foot ulcer: Secondary | ICD-10-CM | POA: Diagnosis not present

## 2020-07-21 DIAGNOSIS — E1140 Type 2 diabetes mellitus with diabetic neuropathy, unspecified: Secondary | ICD-10-CM | POA: Diagnosis not present

## 2020-07-21 DIAGNOSIS — L97512 Non-pressure chronic ulcer of other part of right foot with fat layer exposed: Secondary | ICD-10-CM | POA: Diagnosis not present

## 2020-07-21 DIAGNOSIS — L97522 Non-pressure chronic ulcer of other part of left foot with fat layer exposed: Secondary | ICD-10-CM | POA: Diagnosis not present

## 2020-07-21 NOTE — Progress Notes (Signed)
DALVIN, CLIPPER ([id number]) Visit Report for 07/21/2020 Arrival Information Details Patient Name: LENNIN, OSMOND Date of Service: 07/21/2020 8:45 AM Medical Record Number: [id number] Patient Account Number: [id number] Date of Birth/Sex: 05/10/1961 (59 y.o. M) Treating RN: Army Melia Primary Care Asta Corbridge: Merrie Roof Other Clinician: Referring Ruger Saxer: Merrie Roof Treating Zaidin Blyden/Extender: Melburn Hake, HOYT Weeks in Treatment: 6 Visit Information History Since Last Visit All ordered tests and consults were completed: No Patient Arrived: Ambulatory Added or deleted any medications: No Arrival Time: 08:46 Any new allergies or adverse reactions: No Accompanied By: self Had a fall or experienced change in No Transfer Assistance: None activities of daily living that may affect Patient Identification Verified: Yes risk of falls: Secondary Verification Process Completed: Yes Signs or symptoms of abuse/neglect since last visito No Patient Requires Transmission-Based Precautions: No Hospitalized since last visit: No Patient Has Alerts: Yes Has Dressing in Place as Prescribed: Yes Patient Alerts: Type II Diabetic Pain Present Now: No Electronic Signature(s) Signed: 07/21/2020 10:15:32 AM By: Darci Needle Entered By: Darci Needle on 07/21/2020 08:48:24 Cragg, Kailon ([id number]) -------------------------------------------------------------------------------- Encounter Discharge Information Details Patient Name: [name] Date of Service: 07/21/2020 8:45 AM Medical Record Number: [id number] Patient Account Number: [id number] Date of Birth/Sex: 1961-09-04 (58 y.o. M) Treating RN: Army Melia Primary Care Casaundra Takacs: Merrie Roof Other Clinician: Referring Medard Decuir: Merrie Roof Treating Ferd Horrigan/Extender: Melburn Hake, HOYT Weeks in Treatment: 6 Encounter Discharge Information Items Post Procedure Vitals Discharge Condition: Stable Temperature (F): 97.8 Ambulatory Status:  Ambulatory Pulse (bpm): 70 Discharge Destination: Home Respiratory Rate (breaths/min): 16 Transportation: Private Auto Blood Pressure (mmHg): 132/60 Accompanied By: self Schedule Follow-up Appointment: No Clinical Summary of Care: Electronic Signature(s) Signed: 07/21/2020 3:43:33 PM By: Army Melia Entered By: Army Melia on 07/21/2020 09:35:10 Nihiser, Quamir ([id number]) -------------------------------------------------------------------------------- Lower Extremity Assessment Details Patient Name: [name] Date of Service: 07/21/2020 8:45 AM Medical Record Number: [id number] Patient Account Number: [id number] Date of Birth/Sex: 1961-06-16 (59 y.o. M) Treating RN: Army Melia Primary Care Libbie Bartley: Merrie Roof Other Clinician: Referring Joziah Dollins: Merrie Roof Treating Florie Carico/Extender: Melburn Hake, HOYT Weeks in Treatment: 6 Edema Assessment Assessed: [Left: No] [Right: No] Edema: [Left: No] [Right: No] Calf Left: Right: Point of Measurement: 41 cm From Medial Instep 43 cm 37 cm Ankle Left: Right: Point of Measurement: 25 cm From Medial Instep 25 cm 24 cm Vascular Assessment Pulses: Dorsalis Pedis Palpable: [Left:Yes] [Right:Yes] Posterior Tibial Palpable: [Left:Yes] [Right:Yes] Electronic Signature(s) Signed: 07/21/2020 10:15:32 AM By: Darci Needle Signed: 07/21/2020 3:43:33 PM By: Army Melia Entered By: Darci Needle on 07/21/2020 09:00:49 Lard, Kodey ([id number]) -------------------------------------------------------------------------------- Multi Wound Chart Details Patient Name: [name] Date of Service: 07/21/2020 8:45 AM Medical Record Number: [id number] Patient Account Number: [id number] Date of Birth/Sex: September 27, 1961 (59 y.o. M) Treating RN: Army Melia Primary Care Malini Flemings: Merrie Roof Other Clinician: Referring Demesha Boorman: Merrie Roof Treating Jonothan Heberle/Extender: Melburn Hake, HOYT Weeks in Treatment: 6 Vital Signs Height(in):  72 Pulse(bpm): 70 Weight(lbs): 249 Blood Pressure(mmHg): 132/60 Body Mass Index(BMI): 34 Temperature(F): 97.8 Respiratory Rate(breaths/min): 18 Photos: Wound Location: Right, Plantar Foot Left, Anterior Toe Great Left, Circumferential Toe Second Wounding Event: Trauma Gradually Appeared Gradually Appeared Primary Etiology: Trauma, Other Diabetic Wound/Ulcer of the Lower Diabetic Wound/Ulcer of the Lower Extremity Extremity Secondary Etiology: Diabetic Wound/Ulcer of the Lower N/A N/A Extremity Comorbid History: Cataracts, Type II Diabetes, Cataracts, Type II Diabetes, Cataracts, Type II Diabetes, Neuropathy Neuropathy Neuropathy Date Acquired: 05/17/2020 05/07/2020 05/17/2020 Weeks of Treatment: '6 6 6 ' Wound Status: Open Open Open Measurements L x W x D (cm) 0.5x0.4x0.2 0.3x0.8x0.1  0.4x0.3x0.2 Area (cm) : 0.157 0.188 0.094 Volume (cm) : 0.031 0.019 0.019 % Reduction in Area: 33.50% 85.50% 99.70% % Reduction in Volume: 56.30% 92.70% 100.00% Classification: Full Thickness Without Exposed Grade 1 Grade 2 Support Structures Exudate Amount: Medium Medium Small Exudate Type: Serosanguineous Serous Serosanguineous Exudate Color: red, brown amber red, brown Wound Margin: Flat and Intact Epibole Flat and Intact Granulation Amount: Large (67-100%) None Present (0%) Large (67-100%) Granulation Quality: Pink N/A Red Necrotic Amount: None Present (0%) Large (67-100%) None Present (0%) Exposed Structures: Fat Layer (Subcutaneous Tissue) Fat Layer (Subcutaneous Tissue) Fat Layer (Subcutaneous Tissue) Exposed: Yes Exposed: Yes Exposed: Yes Fascia: No Fascia: No Fascia: No Tendon: No Tendon: No Tendon: No Muscle: No Muscle: No Muscle: No Joint: No Joint: No Joint: No Bone: No Bone: No Bone: No Epithelialization: Small (1-33%) None Large (67-100%) Treatment Notes Electronic Signature(s) Signed: 07/21/2020 3:43:33 PM By: Army Melia Entered By: Army Melia on 07/21/2020  09:27:00 Ribas, Bruk ([id number]) Mars, Uno ([id number]) -------------------------------------------------------------------------------- Multi-Disciplinary Care Plan Details Patient Name: [name] Date of Service: 07/21/2020 8:45 AM Medical Record Number: [id number] Patient Account Number: [id number] Date of Birth/Sex: 1961-10-26 (59 y.o. M) Treating RN: Army Melia Primary Care Ellissa Ayo: Merrie Roof Other Clinician: Referring Sequoya Hogsett: Merrie Roof Treating Bence Trapp/Extender: Melburn Hake, HOYT Weeks in Treatment: 6 Active Inactive Osteomyelitis Nursing Diagnoses: Potential for infection: osteomyelitis Goals: Diagnostic evaluation for osteomyelitis completed as ordered Date Initiated: 06/07/2020 Date Inactivated: 07/12/2020 Target Resolution Date: 07/06/2020 Goal Status: Met Patient/caregiver will verbalize understanding of disease process and disease management Date Initiated: 06/07/2020 Date Inactivated: 07/12/2020 Target Resolution Date: 07/06/2020 Goal Status: Met Patient's osteomyelitis will resolve Date Initiated: 06/07/2020 Target Resolution Date: 08/18/2020 Goal Status: Active Signs and symptoms for osteomyelitis will be recognized and promptly addressed Date Initiated: 06/07/2020 Date Inactivated: 07/12/2020 Target Resolution Date: 07/06/2020 Goal Status: Met Interventions: Assess for signs and symptoms of osteomyelitis resolution every visit Provide education on osteomyelitis Notes: Electronic Signature(s) Signed: 07/21/2020 3:43:33 PM By: Army Melia Entered By: Army Melia on 07/21/2020 09:26:51 Capri, Halo ([id number]) -------------------------------------------------------------------------------- Pain Assessment Details Patient Name: [name] Date of Service: 07/21/2020 8:45 AM Medical Record Number: [id number] Patient Account Number: [id number] Date of Birth/Sex: [birth year] (59 y.o. M) Treating RN: Army Melia Primary Care Brett Darko: Merrie Roof Other Clinician: Referring Rien Marland: Merrie Roof Treating Daemyn Gariepy/Extender: Melburn Hake, HOYT Weeks in Treatment: 6 Active Problems Location of Pain Severity and Description of Pain Patient Has Paino No Site Locations With Dressing Change: No Pain Management and Medication Current Pain Management: Electronic Signature(s) Signed: 07/21/2020 10:15:32 AM By: Darci Needle Signed: 07/21/2020 3:43:33 PM By: Army Melia Entered By: Darci Needle on 07/21/2020 08:49:17 Nawrot, Ho ([id number]) -------------------------------------------------------------------------------- Patient/Caregiver Education Details Patient Name: [name] Date of Service: 07/21/2020 8:45 AM Medical Record Number: [id number] Patient Account Number: [id number] Date of Birth/Gender: 1961-03-24 (59 y.o. M) Treating RN: Army Melia Primary Care Physician: Merrie Roof Other Clinician: Referring Physician: Merrie Roof Treating Physician/Extender: Sharalyn Ink in Treatment: 6 Education Assessment Education Provided To: Patient Education Topics Provided Wound/Skin Impairment: Handouts: Caring for Your Ulcer Methods: Demonstration, Explain/Verbal Responses: State content correctly Electronic Signature(s) Signed: 07/21/2020 3:43:33 PM By: Army Melia Entered By: Army Melia on 07/21/2020 09:34:11 Nepomuceno, Jushua ([id number]) -------------------------------------------------------------------------------- Wound Assessment Details Patient Name: [name] Date of Service: 07/21/2020 8:45 AM Medical Record Number: [id number] Patient Account Number: [id number] Date of Birth/Sex: 14-Mar-1961 (59 y.o. M) Treating RN: Army Melia Primary Care Natonya Finstad: Merrie Roof Other Clinician: Referring Rickell Wiehe: Merrie Roof Treating Anaysia Germer/Extender: Melburn Hake, HOYT  Weeks in Treatment: 6 Wound Status Wound Number: 1 Primary Etiology: Trauma, Other Wound Location: Right, Plantar  Foot Secondary Etiology: Diabetic Wound/Ulcer of the Lower Extremity Wounding Event: Trauma Wound Status: Open Date Acquired: 05/17/2020 Comorbid History: Cataracts, Type II Diabetes, Neuropathy Weeks Of Treatment: 6 Clustered Wound: No Photos Wound Measurements Length: (cm) 0.5 Width: (cm) 0.4 Depth: (cm) 0.2 Area: (cm) 0.157 Volume: (cm) 0.031 % Reduction in Area: 33.5% % Reduction in Volume: 56.3% Epithelialization: Small (1-33%) Wound Description Classification: Full Thickness Without Exposed Support Structu Wound Margin: Flat and Intact Exudate Amount: Medium Exudate Type: Serosanguineous Exudate Color: red, brown res Foul Odor After Cleansing: No Slough/Fibrino No Wound Bed Granulation Amount: Large (67-100%) Exposed Structure Granulation Quality: Pink Fascia Exposed: No Necrotic Amount: None Present (0%) Fat Layer (Subcutaneous Tissue) Exposed: Yes Tendon Exposed: No Muscle Exposed: No Joint Exposed: No Bone Exposed: No Treatment Notes Wound #1 (Right, Plantar Foot) Notes scell, conform Electronic Signature(s) Signed: 07/21/2020 10:15:32 AM By: Mauri Reading, Yichen ([id number]) Signed: 07/21/2020 3:43:33 PM By: Army Melia Entered By: Darci Needle on 07/21/2020 08:56:55 Nuccio, Waddell ([id number]) -------------------------------------------------------------------------------- Wound Assessment Details Patient Name: [name] Date of Service: 07/21/2020 8:45 AM Medical Record Number: [id number] Patient Account Number: [id number] Date of Birth/Sex: [birth year] (59 y.o. M) Treating RN: Army Melia Primary Care Illiana Losurdo: Merrie Roof Other Clinician: Referring Kahner Yanik: Merrie Roof Treating Jonnette Nuon/Extender: Melburn Hake, HOYT Weeks in Treatment: 6 Wound Status Wound Number: 2 Primary Etiology: Diabetic Wound/Ulcer of the Lower Extremity Wound Location: Left, Anterior Toe Great Wound Status: Open Wounding Event: Gradually  Appeared Comorbid History: Cataracts, Type II Diabetes, Neuropathy Date Acquired: 05/07/2020 Weeks Of Treatment: 6 Clustered Wound: No Photos Wound Measurements Length: (cm) 0.3 Width: (cm) 0.8 Depth: (cm) 0.1 Area: (cm) 0.188 Volume: (cm) 0.019 % Reduction in Area: 85.5% % Reduction in Volume: 92.7% Epithelialization: None Wound Description Classification: Grade 1 Fou Wound Margin: Epibole Slo Exudate Amount: Medium Exudate Type: Serous Exudate Color: amber l Odor After Cleansing: No ugh/Fibrino Yes Wound Bed Granulation Amount: None Present (0%) Exposed Structure Necrotic Amount: Large (67-100%) Fascia Exposed: No Necrotic Quality: Adherent Slough Fat Layer (Subcutaneous Tissue) Exposed: Yes Tendon Exposed: No Muscle Exposed: No Joint Exposed: No Bone Exposed: No Treatment Notes Wound #2 (Left, Anterior Toe Great) Notes scell, conform Electronic Signature(s) Signed: 07/21/2020 10:15:32 AM By: Mauri Reading, Siraj ([id number]) Signed: 07/21/2020 3:43:33 PM By: Army Melia Entered By: Darci Needle on 07/21/2020 08:57:25 Bielicki, Amaan ([id number]) -------------------------------------------------------------------------------- Wound Assessment Details Patient Name: [name] Date of Service: 07/21/2020 8:45 AM Medical Record Number: [id number] Patient Account Number: [id number] Date of Birth/Sex: [birth year] (59 y.o. M) Treating RN: Army Melia Primary Care Seanmichael Salmons: Merrie Roof Other Clinician: Referring Makynna Manocchio: Merrie Roof Treating Saraphina Lauderbaugh/Extender: Melburn Hake, HOYT Weeks in Treatment: 6 Wound Status Wound Number: 3 Primary Etiology: Diabetic Wound/Ulcer of the Lower Extremity Wound Location: Left, Circumferential Toe Second Wound Status: Open Wounding Event: Gradually Appeared Comorbid History: Cataracts, Type II Diabetes, Neuropathy Date Acquired: 05/17/2020 Weeks Of Treatment: 6 Clustered Wound: No Photos Wound  Measurements Length: (cm) 0.4 Width: (cm) 0.3 Depth: (cm) 0.2 Area: (cm) 0.094 Volume: (cm) 0.019 % Reduction in Area: 99.7% % Reduction in Volume: 100% Epithelialization: Large (67-100%) Wound Description Classification: Grade 2 Foul Wound Margin: Flat and Intact Slou Exudate Amount: Small Exudate Type: Serosanguineous Exudate Color: red, brown Odor After Cleansing: No gh/Fibrino No Wound Bed Granulation Amount: Large (67-100%) Exposed Structure Granulation Quality: Red Fascia Exposed: No Necrotic Amount: None Present (0%) Fat Layer (Subcutaneous Tissue) Exposed:  Yes Tendon Exposed: No Muscle Exposed: No Joint Exposed: No Bone Exposed: No Treatment Notes Wound #3 (Left, Circumferential Toe Second) Notes scell, conform Electronic Signature(s) Signed: 07/21/2020 10:15:32 AM By: Mauri Reading, Currie ([id number]) Signed: 07/21/2020 3:43:33 PM By: Army Melia Entered By: Darci Needle on 07/21/2020 08:57:50 Halfmann, Tandy ([id number]) -------------------------------------------------------------------------------- Vitals Details Patient Name: [name] Date of Service: 07/21/2020 8:45 AM Medical Record Number: [id number] Patient Account Number: [id number] Date of Birth/Sex: [birth year] (59 y.o. M) Treating RN: Army Melia Primary Care Avary Pitsenbarger: Merrie Roof Other Clinician: Referring Ballard Budney: Merrie Roof Treating Cataleya Cristina/Extender: Melburn Hake, HOYT Weeks in Treatment: 6 Vital Signs Time Taken: 08:40 Temperature (F): 97.8 Height (in): 72 Pulse (bpm): 70 Weight (lbs): 249 Respiratory Rate (breaths/min): 18 Body Mass Index (BMI): 33.8 Blood Pressure (mmHg): 132/60 Reference Range: 80 - 120 mg / dl Electronic Signature(s) Signed: 07/21/2020 10:15:32 AM By: Darci Needle Entered By: Darci Needle on 07/21/2020 08:49:07

## 2020-07-21 NOTE — Progress Notes (Addendum)
JACOBO, MONCRIEF ([id number]) Visit Report for 07/21/2020 Chief Complaint Document Details Patient Name: [name], [name] Date of Service: 07/21/2020 8:45 AM Medical Record Number: [id number] Patient Account Number: [id number] Date of Birth/Sex: 04-May-1961 (59 y.o. M) Treating RN: Rodell Perna Primary Care Provider: Roosvelt Maser Other Clinician: Referring Provider: Roosvelt Maser Treating Provider/Extender: Linwood Dibbles, Evana Runnels Weeks in Treatment: 6 Information Obtained from: Patient Chief Complaint Left foot/toe ulcers Electronic Signature(s) Signed: 07/21/2020 9:06:08 AM By: Lenda Kelp PA-C Entered By: Lenda Kelp on 07/21/2020 09:06:08 Duerst, Zedrick ([id number]) -------------------------------------------------------------------------------- Debridement Details Patient Name: [name] Date of Service: 07/21/2020 8:45 AM Medical Record Number: [id number] Patient Account Number: [id number] Date of Birth/Sex: Apr 25, 1961 (59 y.o. M) Treating RN: Rodell Perna Primary Care Provider: Roosvelt Maser Other Clinician: Referring Provider: Roosvelt Maser Treating Provider/Extender: Linwood Dibbles, Lipa Knauff Weeks in Treatment: 6 Debridement Performed for Wound #3 Left,Circumferential Toe Second Assessment: Performed By: Physician [name], Lavern Crimi E., PA-C Debridement Type: Debridement Severity of Tissue Pre Debridement: Fat layer exposed Level of Consciousness (Pre- Awake and Alert procedure): Pre-procedure Verification/Time Out Yes - 09:25 Taken: Start Time: 09:27 Pain Control: Lidocaine Total Area Debrided (L x W): 0.4 (cm) x 0.3 (cm) = 0.12 (cm) Tissue and other material Viable, Non-Viable, Callus debrided: Level: Non-Viable Tissue Debridement Description: Selective/Open Wound Instrument: Curette Bleeding: None End Time: 09:28 Response to Treatment: Procedure was tolerated well Level of Consciousness (Post- Awake and Alert procedure): Post Debridement Measurements of Total  Wound Length: (cm) 0.4 Width: (cm) 0.3 Depth: (cm) 0.2 Volume: (cm) 0.019 Character of Wound/Ulcer Post Debridement: Stable Severity of Tissue Post Debridement: Fat layer exposed Post Procedure Diagnosis Same as Pre-procedure Electronic Signature(s) Signed: 07/21/2020 3:43:33 PM By: Rodell Perna Signed: 07/22/2020 5:00:44 PM By: Lenda Kelp PA-C Entered By: Rodell Perna on 07/21/2020 09:28:11 Archambeault, Briant ([id number]) -------------------------------------------------------------------------------- Debridement Details Patient Name: [name] Date of Service: 07/21/2020 8:45 AM Medical Record Number: [id number] Patient Account Number: [id number] Date of Birth/Sex: 18-May-1961 (59 y.o. M) Treating RN: Rodell Perna Primary Care Provider: Roosvelt Maser Other Clinician: Referring Provider: Roosvelt Maser Treating Provider/Extender: Linwood Dibbles, Tashea Othman Weeks in Treatment: 6 Debridement Performed for Wound #2 Left,Anterior Toe Great Assessment: Performed By: Physician [name], Whitleigh Garramone E., PA-C Debridement Type: Debridement Severity of Tissue Pre Debridement: Fat layer exposed Level of Consciousness (Pre- Awake and Alert procedure): Pre-procedure Verification/Time Out Yes - 09:25 Taken: Start Time: 09:27 Pain Control: Lidocaine Total Area Debrided (L x W): 0.3 (cm) x 0.8 (cm) = 0.24 (cm) Tissue and other material Viable, Non-Viable, Callus, Slough, Subcutaneous, Slough debrided: Level: Skin/Subcutaneous Tissue Debridement Description: Excisional Instrument: Curette Bleeding: None End Time: 09:28 Response to Treatment: Procedure was tolerated well Level of Consciousness (Post- Awake and Alert procedure): Post Debridement Measurements of Total Wound Length: (cm) 0.3 Width: (cm) 0.8 Depth: (cm) 0.1 Volume: (cm) 0.019 Character of Wound/Ulcer Post Debridement: Stable Severity of Tissue Post Debridement: Fat layer exposed Post Procedure Diagnosis Same as  Pre-procedure Electronic Signature(s) Signed: 07/21/2020 3:43:33 PM By: Rodell Perna Signed: 07/22/2020 5:00:44 PM By: Lenda Kelp PA-C Entered By: Rodell Perna on 07/21/2020 09:29:56 Krempasky, Jessy ([id number]) -------------------------------------------------------------------------------- Debridement Details Patient Name: [name] Date of Service: 07/21/2020 8:45 AM Medical Record Number: [id number] Patient Account Number: [id number] Date of Birth/Sex: June 14, 1961 (59 y.o. M) Treating RN: Rodell Perna Primary Care Provider: Roosvelt Maser Other Clinician: Referring Provider: Roosvelt Maser Treating Provider/Extender: Linwood Dibbles, Ronette Hank Weeks in Treatment: 6 Debridement Performed for Wound #1 Right,Plantar Foot Assessment: Performed By: Physician [name], Kamari Bilek E., PA-C Debridement  Type: Debridement Severity of Tissue Pre Debridement: Fat layer exposed Level of Consciousness (Pre- Awake and Alert procedure): Pre-procedure Verification/Time Out Yes - 09:25 Taken: Start Time: 09:27 Pain Control: Lidocaine Total Area Debrided (L x W): 0.5 (cm) x 0.4 (cm) = 0.2 (cm) Tissue and other material Viable, Non-Viable, Callus debrided: Level: Non-Viable Tissue Debridement Description: Selective/Open Wound Instrument: Curette Bleeding: None End Time: 09:28 Response to Treatment: Procedure was tolerated well Level of Consciousness (Post- Awake and Alert procedure): Post Debridement Measurements of Total Wound Length: (cm) 0.5 Width: (cm) 0.4 Depth: (cm) 0.2 Volume: (cm) 0.031 Character of Wound/Ulcer Post Debridement: Stable Severity of Tissue Post Debridement: Fat layer exposed Post Procedure Diagnosis Same as Pre-procedure Electronic Signature(s) Signed: 07/21/2020 3:43:33 PM By: Rodell Perna Signed: 07/22/2020 5:00:44 PM By: Lenda Kelp PA-C Entered By: Rodell Perna on 07/21/2020 09:32:54 Vannice, Mohannad  ([id number]) -------------------------------------------------------------------------------- HPI Details Patient Name: [name] Date of Service: 07/21/2020 8:45 AM Medical Record Number: [id number] Patient Account Number: [id number] Date of Birth/Sex: September 04, 1961 (59 y.o. M) Treating RN: Rodell Perna Primary Care Provider: Roosvelt Maser Other Clinician: Referring Provider: Roosvelt Maser Treating Provider/Extender: Linwood Dibbles, Alonnie Bieker Weeks in Treatment: 6 History of Present Illness HPI Description: 06/10/2020 upon evaluation today patient appears to be doing poorly in regard to his right plantar foot, left great toe, and left second toe. Unfortunately he has significant wounds at these locations but especially in regard to the second toe which does have a lot of necrotic tissue over the distal portion of the toe. The great toe does have some necrotic tissue as well. With that being said the patient tells me that this has been going on for 1-2 months and seems to be getting worse especially in regard to the toe. He has not had any specific x-rays to identify obvious signs of osteomyelitis up to this point that something would likely get a different today as well. With that being said he also has not had any current arterial studies which I think is something else that we probably will look into performing at this point. He does have a history of diabetes mellitus type 2, and diabetic neuropathy. Currently he has been using normal shoes for ambulation though he does have some kind of external device so that he does not have to have actual still toed boots anymore at work which he states has helped nonetheless we is at work he does have to have something that protects his feet as such per policy. He tells me he is having to do a lot more walking right now compared to normal but he can try to see if he can change something in that regard. 06/16/2020 upon evaluation today patient appears to be doing well  all things considered with regard to his wounds. I do not feel like anything is it any worse and overall I feel like the Bactrim has done well. The culture that I reviewed did not reveal any specific organisms unfortunately that would help tailor treatment. I did actually review the x-ray as well it does appear that he has evidence of osteomyelitis of the distal tuft of the second toe left foot. That was discussed with him today. He likely will need a referral to infectious disease. 06/30/2020 on evaluation today patient appears to be doing well at this time in regard to his wounds all things considered. The erythema has improved he does have evidence of osteomyelitis again the second toe on the left foot but at the other sites  there were no obvious signs. He does have wounds bilaterally on his feet and toes. These are to require sharp debridement today. He is still pending as far as his appointment with infectious disease. With that being said this is actually scheduled for 29 July which is this month. He also has an appointment on the 27th with vascular. Overall at least we are getting to the point where I think we are getting something is taken care of here. 07/12/2020 patient did have his appointment with [facility] infectious disease. They have placed him on medications which include Flagyl 500 mg and Levaquin 750 mg for the next several weeks until September 9. Other than that he overall seems to be looking quite well and I am very pleased with how things are progressing at this point. 07/21/2020 on evaluation today patient appears to be doing well with regard to his wounds. He is on Flagyl as well as Levaquin which is helping to take care of the infection it appears. Fortunately there is no signs of active infection systemically at this time which is also good news. I am very pleased with where things stand. Electronic Signature(s) Signed: 07/21/2020 9:44:07 AM By: Lenda Kelp PA-C Entered By: Lenda Kelp on 07/21/2020 09:44:06 Volkert, Jarad ([id number]) -------------------------------------------------------------------------------- Physical Exam Details Patient Name: [name] Date of Service: 07/21/2020 8:45 AM Medical Record Number: [id number] Patient Account Number: [id number] Date of Birth/Sex: May 28, 1961 (59 y.o. M) Treating RN: Rodell Perna Primary Care Provider: Roosvelt Maser Other Clinician: Referring Provider: Roosvelt Maser Treating Provider/Extender: Linwood Dibbles, Palmira Stickle Weeks in Treatment: 6 Constitutional Well-nourished and well-hydrated in no acute distress. Respiratory normal breathing without difficulty. Psychiatric this patient is able to make decisions and demonstrates good insight into disease process. Alert and Oriented x 3. pleasant and cooperative. Notes On inspection patient's wound bed actually showed signs of good granulation and epithelization for the most part there was debridement performed at all 3 locations he tolerated this today without complication post debridement wound bed appears to be doing much better which is great news. Electronic Signature(s) Signed: 07/21/2020 9:44:27 AM By: Lenda Kelp PA-C Entered By: Lenda Kelp on 07/21/2020 09:44:26 Lineback, [name] ([id number]) -------------------------------------------------------------------------------- Physician Orders Details Patient Name: [name] Date of Service: 07/21/2020 8:45 AM Medical Record Number: [id number] Patient Account Number: [id number] Date of Birth/Sex: [birth year] (59 y.o. M) Treating RN: Rodell Perna Primary Care Provider: Roosvelt Maser Other Clinician: Referring Provider: Roosvelt Maser Treating Provider/Extender: Linwood Dibbles, Dollene [name] Weeks in Treatment: 6 Verbal / Phone Orders: No Diagnosis Coding ICD-10 Coding Code Description E11.621 Type 2 diabetes mellitus with foot ulcer L97.522 Non-pressure chronic ulcer of other part of left foot with fat layer  exposed L97.528 Non-pressure chronic ulcer of other part of left foot with other specified severity E11.40 Type 2 diabetes mellitus with diabetic neuropathy, unspecified Wound Cleansing Wound #1 Right,Plantar Foot o Clean wound with Normal Saline. Wound #2 Left,Anterior Toe Great o Clean wound with Normal Saline. Wound #3 Left,Circumferential Toe Second o Clean wound with Normal Saline. Primary Wound Dressing Wound #1 Right,Plantar Foot o Silver Alginate Wound #2 Left,Anterior Toe Great o Silver Alginate Wound #3 Left,Circumferential Toe Second o Silver Alginate Secondary Dressing Wound #1 Right,Plantar Foot o Gauze and Kerlix/Conform Wound #2 Left,Anterior Toe Great o Gauze and Kerlix/Conform Wound #3 Left,Circumferential Toe Second o Gauze and Kerlix/Conform Dressing Change Frequency Wound #1 Right,Plantar Foot o Change Dressing Monday, Wednesday, Friday Wound #2 Left,Anterior Toe Great o Change Dressing Monday, Wednesday, Friday Wound #3  Left,Circumferential Toe Second o Change Dressing Monday, Wednesday, Friday Follow-up Appointments Wound #1 Right,Plantar Foot o Return Appointment in 1 week. Wound #2 Left,Anterior Toe Great o Return Appointment in 1 week. Wound #3 Left,Circumferential Toe Second Anstead, Klever ([id number]) o Return Appointment in 1 week. Edema Control Wound #1 Right,Plantar Foot o Elevate legs to the level of the heart and pump ankles as often as possible Wound #2 Left,Anterior Toe Great o Elevate legs to the level of the heart and pump ankles as often as possible Wound #3 Left,Circumferential Toe Second o Elevate legs to the level of the heart and pump ankles as often as possible Off-Loading Wound #1 Right,Plantar Foot o Open toe surgical shoe Wound #2 Left,Anterior Toe Great o Open toe surgical shoe Wound #3 Left,Circumferential Toe Second o Open toe surgical shoe Additional Orders /  Instructions Wound #1 Right,Plantar Foot o Increase protein intake. Wound #2 Left,Anterior Toe Great o Increase protein intake. Wound #3 Left,Circumferential Toe Second o Increase protein intake. Medications-please add to medication list. Wound #1 Right,Plantar Foot o P.O. Antibiotics - Levaquin and Flagyl Wound #2 Left,Anterior Toe Great o P.O. Antibiotics - Levaquin and Flagyl Wound #3 Left,Circumferential Toe Second o P.O. Antibiotics - Levaquin and Flagyl Electronic Signature(s) Signed: 07/21/2020 3:43:33 PM By: Rodell Perna Signed: 07/22/2020 5:00:44 PM By: Lenda Kelp PA-C Entered By: Rodell Perna on 07/21/2020 09:33:52 Fitzhenry, Pope ([id number]) -------------------------------------------------------------------------------- Problem List Details Patient Name: [name] Date of Service: 07/21/2020 8:45 AM Medical Record Number: [id number] Patient Account Number: [id number] Date of Birth/Sex: [birth year] (59 y.o. M) Treating RN: Rodell Perna Primary Care Provider: Roosvelt Maser Other Clinician: Referring Provider: Roosvelt Maser Treating Provider/Extender: Lenda Kelp Weeks in Treatment: 6 Active Problems ICD-10 Encounter Code Description Active Date MDM Diagnosis E11.621 Type 2 diabetes mellitus with foot ulcer 06/07/2020 No Yes L97.522 Non-pressure chronic ulcer of other part of left foot with fat layer 06/07/2020 No Yes exposed L97.512 Non-pressure chronic ulcer of other part of right foot with fat layer 06/07/2020 No Yes exposed E11.40 Type 2 diabetes mellitus with diabetic neuropathy, unspecified 06/07/2020 No Yes Inactive Problems Resolved Problems Electronic Signature(s) Signed: 07/21/2020 9:46:45 AM By: Lenda Kelp PA-C Previous Signature: 07/21/2020 9:06:02 AM Version By: Lenda Kelp PA-C Entered By: Lenda Kelp on 07/21/2020 09:46:44 Launer, Jazz  ([id number]) -------------------------------------------------------------------------------- Progress Note Details Patient Name: [name] Date of Service: 07/21/2020 8:45 AM Medical Record Number: [id number] Patient Account Number: [id number] Date of Birth/Sex: 08/10/1961 (59 y.o. M) Treating RN: Rodell Perna Primary Care Provider: Roosvelt Maser Other Clinician: Referring Provider: Roosvelt Maser Treating Provider/Extender: Linwood Dibbles, Maile Linford Weeks in Treatment: 6 Subjective Chief Complaint Information obtained from Patient Left foot/toe ulcers History of Present Illness (HPI) 06/10/2020 upon evaluation today patient appears to be doing poorly in regard to his right plantar foot, left great toe, and left second toe. Unfortunately he has significant wounds at these locations but especially in regard to the second toe which does have a lot of necrotic tissue over the distal portion of the toe. The great toe does have some necrotic tissue as well. With that being said the patient tells me that this has been going on for 1-2 months and seems to be getting worse especially in regard to the toe. He has not had any specific x-rays to identify obvious signs of osteomyelitis up to this point that something would likely get a different today as well. With that being said he also has not had any current arterial studies which  I think is something else that we probably will look into performing at this point. He does have a history of diabetes mellitus type 2, and diabetic neuropathy. Currently he has been using normal shoes for ambulation though he does have some kind of external device so that he does not have to have actual still toed boots anymore at work which he states has helped nonetheless we is at work he does have to have something that protects his feet as such per policy. He tells me he is having to do a lot more walking right now compared to normal but he can try to see if he can change  something in that regard. 06/16/2020 upon evaluation today patient appears to be doing well all things considered with regard to his wounds. I do not feel like anything is it any worse and overall I feel like the Bactrim has done well. The culture that I reviewed did not reveal any specific organisms unfortunately that would help tailor treatment. I did actually review the x-ray as well it does appear that he has evidence of osteomyelitis of the distal tuft of the second toe left foot. That was discussed with him today. He likely will need a referral to infectious disease. 06/30/2020 on evaluation today patient appears to be doing well at this time in regard to his wounds all things considered. The erythema has improved he does have evidence of osteomyelitis again the second toe on the left foot but at the other sites there were no obvious signs. He does have wounds bilaterally on his feet and toes. These are to require sharp debridement today. He is still pending as far as his appointment with infectious disease. With that being said this is actually scheduled for 29 July which is this month. He also has an appointment on the 27th with vascular. Overall at least we are getting to the point where I think we are getting something is taken care of here. 07/12/2020 patient did have his appointment with [facility] infectious disease. They have placed him on medications which include Flagyl 500 mg and Levaquin 750 mg for the next several weeks until September 9. Other than that he overall seems to be looking quite well and I am very pleased with how things are progressing at this point. 07/21/2020 on evaluation today patient appears to be doing well with regard to his wounds. He is on Flagyl as well as Levaquin which is helping to take care of the infection it appears. Fortunately there is no signs of active infection systemically at this time which is also good news. I am very pleased with where things  stand. Objective Constitutional Well-nourished and well-hydrated in no acute distress. Vitals Time Taken: 8:40 AM, Height: 72 in, Weight: 249 lbs, BMI: 33.8, Temperature: 97.8 F, Pulse: 70 bpm, Respiratory Rate: 18 breaths/min, Blood Pressure: 132/60 mmHg. Respiratory normal breathing without difficulty. Psychiatric this patient is able to make decisions and demonstrates good insight into disease process. Alert and Oriented x 3. pleasant and cooperative. General Notes: On inspection patient's wound bed actually showed signs of good granulation and epithelization for the most part there was debridement performed at all 3 locations he tolerated this today without complication post debridement wound bed appears to be doing much better which is great news. Integumentary (Hair, Skin) Aten, Trevontae ([id number]) Wound #1 status is Open. Original cause of wound was Trauma. The wound is located on the Right,Plantar Foot. The wound measures 0.5cm length x 0.4cm width x  0.2cm depth; 0.157cm^2 area and 0.031cm^3 volume. There is Fat Layer (Subcutaneous Tissue) Exposed exposed. There is a medium amount of serosanguineous drainage noted. The wound margin is flat and intact. There is large (67-100%) pink granulation within the wound bed. There is no necrotic tissue within the wound bed. Wound #2 status is Open. Original cause of wound was Gradually Appeared. The wound is located on the [employer]. The wound measures 0.3cm length x 0.8cm width x 0.1cm depth; 0.188cm^2 area and 0.019cm^3 volume. There is Fat Layer (Subcutaneous Tissue) Exposed exposed. There is a medium amount of serous drainage noted. The wound margin is epibole. There is no granulation within the wound bed. There is a large (67-100%) amount of necrotic tissue within the wound bed including Adherent Slough. Wound #3 status is Open. Original cause of wound was Gradually Appeared. The wound is located on the Left,Circumferential  Toe Second. The wound measures 0.4cm length x 0.3cm width x 0.2cm depth; 0.094cm^2 area and 0.019cm^3 volume. There is Fat Layer (Subcutaneous Tissue) Exposed exposed. There is a small amount of serosanguineous drainage noted. The wound margin is flat and intact. There is large (67-100%) red granulation within the wound bed. There is no necrotic tissue within the wound bed. Assessment Active Problems ICD-10 Type 2 diabetes mellitus with foot ulcer Non-pressure chronic ulcer of other part of left foot with fat layer exposed Non-pressure chronic ulcer of other part of right foot with fat layer exposed Type 2 diabetes mellitus with diabetic neuropathy, unspecified Procedures Wound #1 Pre-procedure diagnosis of Wound #1 is a Trauma, Other located on the Right,Plantar Foot .Severity of Tissue Pre Debridement is: Fat layer exposed. There was a Selective/Open Wound Non-Viable Tissue Debridement with a total area of 0.2 sq cm performed by [name], Dayan Kreis E., PA-C. With the following instrument(s): Curette to remove Viable and Non-Viable tissue/material. Material removed includes Callus after achieving pain control using Lidocaine. A time out was conducted at 09:25, prior to the start of the procedure. There was no bleeding. The procedure was tolerated well. Post Debridement Measurements: 0.5cm length x 0.4cm width x 0.2cm depth; 0.031cm^3 volume. Character of Wound/Ulcer Post Debridement is stable. Severity of Tissue Post Debridement is: Fat layer exposed. Post procedure Diagnosis Wound #1: Same as Pre-Procedure Wound #2 Pre-procedure diagnosis of Wound #2 is a Diabetic Wound/Ulcer of the Lower Extremity located on the Left,Anterior Toe Great .Severity of Tissue Pre Debridement is: Fat layer exposed. There was a Excisional Skin/Subcutaneous Tissue Debridement with a total area of 0.24 sq cm performed by [name], Arris Meyn E., PA-C. With the following instrument(s): Curette to remove Viable and Non-Viable  tissue/material. Material removed includes Callus, Subcutaneous Tissue, and Slough after achieving pain control using Lidocaine. A time out was conducted at 09:25, prior to the start of the procedure. There was no bleeding. The procedure was tolerated well. Post Debridement Measurements: 0.3cm length x 0.8cm width x 0.1cm depth; 0.019cm^3 volume. Character of Wound/Ulcer Post Debridement is stable. Severity of Tissue Post Debridement is: Fat layer exposed. Post procedure Diagnosis Wound #2: Same as Pre-Procedure Wound #3 Pre-procedure diagnosis of Wound #3 is a Diabetic Wound/Ulcer of the Lower Extremity located on the Left,Circumferential Toe Second .Severity of Tissue Pre Debridement is: Fat layer exposed. There was a Selective/Open Wound Non-Viable Tissue Debridement with a total area of 0.12 sq cm performed by [name], Koda Defrank E., PA-C. With the following instrument(s): Curette to remove Viable and Non-Viable tissue/material. Material removed includes Callus after achieving pain control using  Lidocaine. A time out was conducted at 09:25, prior to the start of the procedure. There was no bleeding. The procedure was tolerated well. Post Debridement Measurements: 0.4cm length x 0.3cm width x 0.2cm depth; 0.019cm^3 volume. Character of Wound/Ulcer Post Debridement is stable. Severity of Tissue Post Debridement is: Fat layer exposed. Post procedure Diagnosis Wound #3: Same as Pre-Procedure Plan Wound Cleansing: Wound #1 Right,Plantar Foot: Dhingra, Janmichael ([id number]) Clean wound with Normal Saline. Wound #2 Left,Anterior Toe Great: Clean wound with Normal Saline. Wound #3 Left,Circumferential Toe Second: Clean wound with Normal Saline. Primary Wound Dressing: Wound #1 Right,Plantar Foot: Silver Alginate Wound #2 Left,Anterior Toe Great: Silver Alginate Wound #3 Left,Circumferential Toe Second: Silver Alginate Secondary Dressing: Wound #1 Right,Plantar Foot: Gauze and  Kerlix/Conform Wound #2 Left,Anterior Toe Great: Gauze and Kerlix/Conform Wound #3 Left,Circumferential Toe Second: Gauze and Kerlix/Conform Dressing Change Frequency: Wound #1 Right,Plantar Foot: Change Dressing Monday, Wednesday, Friday Wound #2 Left,Anterior Toe Great: Change Dressing Monday, Wednesday, Friday Wound #3 Left,Circumferential Toe Second: Change Dressing Monday, Wednesday, Friday Follow-up Appointments: Wound #1 Right,Plantar Foot: Return Appointment in 1 week. Wound #2 Left,Anterior Toe Great: Return Appointment in 1 week. Wound #3 Left,Circumferential Toe Second: Return Appointment in 1 week. Edema Control: Wound #1 Right,Plantar Foot: Elevate legs to the level of the heart and pump ankles as often as possible Wound #2 Left,Anterior Toe Great: Elevate legs to the level of the heart and pump ankles as often as possible Wound #3 Left,Circumferential Toe Second: Elevate legs to the level of the heart and pump ankles as often as possible Off-Loading: Wound #1 Right,Plantar Foot: Open toe surgical shoe Wound #2 Left,Anterior Toe Great: Open toe surgical shoe Wound #3 Left,Circumferential Toe Second: Open toe surgical shoe Additional Orders / Instructions: Wound #1 Right,Plantar Foot: Increase protein intake. Wound #2 Left,Anterior Toe Great: Increase protein intake. Wound #3 Left,Circumferential Toe Second: Increase protein intake. Medications-please add to medication list.: Wound #1 Right,Plantar Foot: P.O. Antibiotics - Levaquin and Flagyl Wound #2 Left,Anterior Toe Great: P.O. Antibiotics - Levaquin and Flagyl Wound #3 Left,Circumferential Toe Second: P.O. Antibiotics - Levaquin and Flagyl 1. I would recommend that we go ahead and continue with the silver alginate dressing to all wound locations I think that still the best way to go. 2. I am also can recommend at this time that the patient continue with the rolled gauze to secure in place that seems to  be doing well. 3. I am also can recommend that he continue with the antibiotics as prescribed by infectious disease that seems to be doing very well. 4. He should continue with the open toe surgical shoe as well. We will see patient back for reevaluation in 1 week here in the clinic. If anything worsens or changes patient will contact our office for additional recommendations. Electronic Signature(s) Signed: 07/21/2020 9:48:09 AM By: Lenda Kelp PA-C Previous Signature: 07/21/2020 9:45:53 AM Version By: Lenda Kelp PA-C Entered By: Lenda Kelp on 07/21/2020 09:48:09 Flaten, Sally ([id number]) SAVILLE, Taavi ([id number]) -------------------------------------------------------------------------------- SuperBill Details Patient Name: [name] Date of Service: 07/21/2020 Medical Record Number: [id number] Patient Account Number: [id number] Date of Birth/Sex: 1961-09-05 (59 y.o. 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 E11.40 Type 2 diabetes mellitus with diabetic neuropathy, unspecified Facility Procedures CPT4 Code: [id number] Description: 11042 - DEB SUBQ TISSUE 20 SQ CM/< Modifier: Quantity: 1 CPT4 Code: Description: ICD-10 Diagnosis Description L97.522 Non-pressure chronic ulcer of other part of left foot with fat layer expos L97.512 Non-pressure chronic ulcer of other part of right foot with fat layer expo Modifier: ed sed Quantity: CPT4 Code: [id number] Description: 97597 - DEBRIDE WOUND 1ST 20 SQ CM OR < Modifier: Quantity: 1 CPT4 Code: Description: ICD-10  Diagnosis Description L97.522 Non-pressure chronic ulcer of other part of left foot with fat layer expos Modifier: ed Quantity: Physician Procedures CPT4 Code: 1478295: [id number] Description: 11042 - WC PHYS SUBQ TISS 20 SQ CM Modifier: Quantity: 1 CPT4 Code: Description: ICD-10 Diagnosis Description L97.522 Non-pressure chronic ulcer of other part of left foot with fat layer expos L97.512 Non-pressure chronic ulcer of other part of right foot with fat layer expo Modifier: ed sed Quantity: CPT4 Code: [id number]: [id number] Description: 97597 - WC PHYS DEBR WO ANESTH 20 SQ CM Modifier: Quantity: 1 CPT4 Code: Description: ICD-10 Diagnosis Description L97.522 Non-pressure chronic ulcer of other part of left foot with fat layer expos Modifier: ed Quantity: Electronic Signature(s) Signed: 07/21/2020 9:50:16 AM By: Lenda KelpStone III, Sovereign Ramiro PA-C Entered By: Lenda KelpStone III, Aliou Mealey on 07/21/2020 09:50:14

## 2020-07-28 ENCOUNTER — Other Ambulatory Visit: Payer: Self-pay

## 2020-07-28 ENCOUNTER — Encounter: Payer: BC Managed Care – PPO | Admitting: Physician Assistant

## 2020-07-28 DIAGNOSIS — L97522 Non-pressure chronic ulcer of other part of left foot with fat layer exposed: Secondary | ICD-10-CM | POA: Diagnosis not present

## 2020-07-28 DIAGNOSIS — L97512 Non-pressure chronic ulcer of other part of right foot with fat layer exposed: Secondary | ICD-10-CM | POA: Diagnosis not present

## 2020-07-28 DIAGNOSIS — E11621 Type 2 diabetes mellitus with foot ulcer: Secondary | ICD-10-CM | POA: Diagnosis not present

## 2020-07-28 DIAGNOSIS — S91301A Unspecified open wound, right foot, initial encounter: Secondary | ICD-10-CM | POA: Diagnosis not present

## 2020-07-28 DIAGNOSIS — E1140 Type 2 diabetes mellitus with diabetic neuropathy, unspecified: Secondary | ICD-10-CM | POA: Diagnosis not present

## 2020-07-28 NOTE — Progress Notes (Addendum)
LABAN, OROURKE ([id number]) Visit Report for 07/28/2020 Chief Complaint Document Details Patient Name: [name], [name] Date of Service: 07/28/2020 3:30 PM Medical Record Number: [id number] Patient Account Number: [id number] Date of Birth/Sex: 1961-11-13 (59 y.o. M) Treating RN: Rodell Perna Primary Care Provider: Roosvelt Maser Other Clinician: Referring Provider: Roosvelt Maser Treating Provider/Extender: Linwood Dibbles, Sloka Volante Weeks in Treatment: 7 Information Obtained from: Patient Chief Complaint Left foot/toe ulcers Electronic Signature(s) Signed: 07/28/2020 3:46:05 PM By: Lenda Kelp PA-C Entered By: Lenda Kelp on 07/28/2020 15:46:05 Eisman, Michale ([id number]) -------------------------------------------------------------------------------- Debridement Details Patient Name: [name] Date of Service: 07/28/2020 3:30 PM Medical Record Number: [id number] Patient Account Number: [id number] Date of Birth/Sex: [birth year] (59 y.o. M) Treating RN: Huel Coventry Primary Care Provider: Roosvelt Maser Other Clinician: Referring Provider: Roosvelt Maser Treating Provider/Extender: Linwood Dibbles, Evita Merida Weeks in Treatment: 7 Debridement Performed for Wound #1 Right,Plantar Foot Assessment: Performed By: Physician [name], Reyann Troop E., PA-C Debridement Type: Debridement Severity of Tissue Pre Debridement: Fat layer exposed Level of Consciousness (Pre- Awake and Alert procedure): Pre-procedure Verification/Time Out Yes - 16:04 Taken: Total Area Debrided (L x W): 0.4 (cm) x 0.4 (cm) = 0.16 (cm) Tissue and other material Callus, Slough, Subcutaneous, Slough debrided: Level: Skin/Subcutaneous Tissue Debridement Description: Excisional Instrument: Curette Bleeding: Moderate Hemostasis Achieved: Pressure Response to Treatment: Procedure was tolerated well Level of Consciousness (Post- Awake and Alert procedure): Post Debridement Measurements of Total Wound Length: (cm) 0.4 Width: (cm)  0.5 Depth: (cm) 0.7 Volume: (cm) 0.11 Character of Wound/Ulcer Post Debridement: Stable Severity of Tissue Post Debridement: Fat layer exposed Post Procedure Diagnosis Same as Pre-procedure Electronic Signature(s) Signed: 07/28/2020 5:00:57 PM By: Lenda Kelp PA-C Signed: 07/28/2020 6:39:29 PM By: Elliot Gurney, BSN, RN, CWS, Kim RN, BSN Entered By: Elliot Gurney, BSN, RN, CWS, Kim on 07/28/2020 16:04:49 Furno, Quay ([id number]) -------------------------------------------------------------------------------- Debridement Details Patient Name: [name] Date of Service: 07/28/2020 3:30 PM Medical Record Number: [id number] Patient Account Number: [id number] Date of Birth/Sex: 1961-01-09 (59 y.o. M) Treating RN: Huel Coventry Primary Care Provider: Roosvelt Maser Other Clinician: Referring Provider: Roosvelt Maser Treating Provider/Extender: Linwood Dibbles, Juelle Dickmann Weeks in Treatment: 7 Debridement Performed for Wound #2 Left,Anterior Toe Great Assessment: Performed By: Physician [name], Kahley Leib E., PA-C Debridement Type: Debridement Severity of Tissue Pre Debridement: Fat layer exposed Level of Consciousness (Pre- Awake and Alert procedure): Pre-procedure Verification/Time Out Yes - 16:04 Taken: Total Area Debrided (L x W): 0.6 (cm) x 0.7 (cm) = 0.42 (cm) Tissue and other material Viable, Non-Viable, Callus, Slough, Subcutaneous, Slough debrided: Level: Skin/Subcutaneous Tissue Debridement Description: Excisional Instrument: Curette Bleeding: Moderate Hemostasis Achieved: Pressure Response to Treatment: Procedure was tolerated well Level of Consciousness (Post- Awake and Alert procedure): Post Debridement Measurements of Total Wound Length: (cm) 0.7 Width: (cm) 0.7 Depth: (cm) 0.6 Volume: (cm) 0.231 Character of Wound/Ulcer Post Debridement: Stable Severity of Tissue Post Debridement: Fat layer exposed Post Procedure Diagnosis Same as Pre-procedure Electronic Signature(s) Signed:  07/28/2020 5:00:57 PM By: Lenda Kelp PA-C Signed: 07/28/2020 6:39:29 PM By: Elliot Gurney, BSN, RN, CWS, Kim RN, BSN Entered By: Elliot Gurney, BSN, RN, CWS, Kim on 07/28/2020 16:05:30 Keitt, Myson ([id number]) -------------------------------------------------------------------------------- HPI Details Patient Name: [name] Date of Service: 07/28/2020 3:30 PM Medical Record Number: [id number] Patient Account Number: [id number] Date of Birth/Sex: [birth year] (59 y.o. M) Treating RN: Rodell Perna Primary Care Provider: Roosvelt Maser Other Clinician: Referring Provider: Roosvelt Maser Treating Provider/Extender: Linwood Dibbles, Warren Lindahl Weeks in Treatment: 7 History of Present Illness HPI Description: 06/10/2020 upon evaluation today patient appears to be  doing poorly in regard to his right plantar foot, left great toe, and left second toe. Unfortunately he has significant wounds at these locations but especially in regard to the second toe which does have a lot of necrotic tissue over the distal portion of the toe. The great toe does have some necrotic tissue as well. With that being said the patient tells me that this has been going on for 1-2 months and seems to be getting worse especially in regard to the toe. He has not had any specific x-rays to identify obvious signs of osteomyelitis up to this point that something would likely get a different today as well. With that being said he also has not had any current arterial studies which I think is something else that we probably will look into performing at this point. He does have a history of diabetes mellitus type 2, and diabetic neuropathy. Currently he has been using normal shoes for ambulation though he does have some kind of external device so that he does not have to have actual still toed boots anymore at work which he states has helped nonetheless we is at work he does have to have something that protects his feet as such per policy. He tells me he is  having to do a lot more walking right now compared to normal but he can try to see if he can change something in that regard. 06/16/2020 upon evaluation today patient appears to be doing well all things considered with regard to his wounds. I do not feel like anything is it any worse and overall I feel like the Bactrim has done well. The culture that I reviewed did not reveal any specific organisms unfortunately that would help tailor treatment. I did actually review the x-ray as well it does appear that he has evidence of osteomyelitis of the distal tuft of the second toe left foot. That was discussed with him today. He likely will need a referral to infectious disease. 06/30/2020 on evaluation today patient appears to be doing well at this time in regard to his wounds all things considered. The erythema has improved he does have evidence of osteomyelitis again the second toe on the left foot but at the other sites there were no obvious signs. He does have wounds bilaterally on his feet and toes. These are to require sharp debridement today. He is still pending as far as his appointment with infectious disease. With that being said this is actually scheduled for 29 July which is this month. He also has an appointment on the 27th with vascular. Overall at least we are getting to the point where I think we are getting something is taken care of here. 07/12/2020 patient did have his appointment with [facility] infectious disease. They have placed him on medications which include Flagyl 500 mg and Levaquin 750 mg for the next several weeks until September 9. Other than that he overall seems to be looking quite well and I am very pleased with how things are progressing at this point. 07/21/2020 on evaluation today patient appears to be doing well with regard to his wounds. He is on Flagyl as well as Levaquin which is helping to take care of the infection it appears. Fortunately there is no signs of active infection  systemically at this time which is also good news. I am very pleased with where things stand. 07/28/20 on evaluation today patient appears to be doing well with regard to his wounds. He has been  tolerating the dressing changes without complication we've been using the alginate currently. With that being said I do feel like the patient is making good progress albeit slow. Electronic Signature(s) Signed: 07/28/2020 4:39:10 PM By: Lenda Kelp PA-C Entered By: Lenda Kelp on 07/28/2020 16:39:10 Maddocks, Won ([id number]) -------------------------------------------------------------------------------- Physical Exam Details Patient Name: [name] Date of Service: 07/28/2020 3:30 PM Medical Record Number: [id number] Patient Account Number: [id number] Date of Birth/Sex: 09-16-1961 (59 y.o. M) Treating RN: Rodell Perna Primary Care Provider: Roosvelt Maser Other Clinician: Referring Provider: Roosvelt Maser Treating Provider/Extender: [name], Makenly Larabee Weeks in Treatment: 7 Constitutional Well-nourished and well-hydrated in no acute distress. Respiratory normal breathing without difficulty. Psychiatric this patient is able to make decisions and demonstrates good insight into disease process. Alert and Oriented x 3. pleasant and cooperative. Notes Patient's wounds currently did require some sharp debridement in regard to the left first toe and the right foot. Both had callus around the edges as well as slough did need to be removed in a good subcutaneous tissue he tolerated that today without complication post debridement wound bed appears to be doing much better which is great news. Electronic Signature(s) Signed: 07/28/2020 4:39:31 PM By: Lenda Kelp PA-C Entered By: Lenda Kelp on 07/28/2020 16:39:30 Racicot, Andie ([id number]) -------------------------------------------------------------------------------- Physician Orders Details Patient Name: [name] Date of  Service: 07/28/2020 3:30 PM Medical Record Number: [id number] Patient Account Number: [id number] Date of Birth/Sex: 07-18-1961 (59 y.o. 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 Wound #2 Left,Anterior Toe Great o Clean wound with Normal Saline. Primary Wound Dressing Wound #1 Right,Plantar Foot o Silver Alginate Wound #2 Left,Anterior Toe Great o Silver Alginate Secondary Dressing Wound #1 Right,Plantar Foot o Gauze and Kerlix/Conform Wound #2 Left,Anterior Toe Great o Gauze and Kerlix/Conform Dressing Change Frequency Wound #1 Right,Plantar Foot o Change Dressing Monday, Wednesday, Friday Wound #2 Left,Anterior Toe Great o Change Dressing Monday, Wednesday, Friday Follow-up Appointments Wound #1 Right,Plantar Foot o Return Appointment in 1 week. Wound #2 Left,Anterior Toe Great o Return Appointment in 1 week. Edema Control Wound #1 Right,Plantar Foot o Elevate legs to the level of the heart and pump ankles as often as possible Wound #2 Left,Anterior Toe Great o Elevate legs to the level of the heart and pump ankles as often as possible Off-Loading Wound #1 Right,Plantar Foot o Open toe surgical shoe Minassian, Michell ([id number]) Wound #2 Left,Anterior Toe Great o Open toe surgical shoe Additional Orders / Instructions Wound #1 Right,Plantar Foot o Increase protein  intake. Wound #2 Left,Anterior Toe Great o Increase protein intake. Medications-please add to medication list. Wound #1 Right,Plantar Foot o P.O. Antibiotics - Continue Levaquin and Flagyl Wound #2 Left,Anterior Toe Great o P.O. Antibiotics - Continue Levaquin and Flagyl Electronic Signature(s) Signed: 07/28/2020 5:00:57 PM By: Lenda Kelp PA-C Signed: 07/28/2020 6:39:29 PM By: Elliot Gurney, BSN, RN, CWS, Kim RN, BSN Entered By: Elliot Gurney, BSN, RN, CWS, Kim on 07/28/2020 16:07:30 Masoud, Terrill ([id number]) -------------------------------------------------------------------------------- Problem List Details Patient Name: Ranell Patrick, Agastya  Date of Service: 07/28/2020 3:30 PM Medical Record Number: [id number] Patient Account Number: [id number] Date of Birth/Sex: 1960/12/27 (59 y.o. M) Treating RN: Rodell Perna Primary Care Provider: Roosvelt Maser Other Clinician: Referring Provider: Roosvelt Maser Treating Provider/Extender: Linwood Dibbles, Trina Asch Weeks in Treatment: 7 Active Problems ICD-10 Encounter Code Description Active Date MDM Diagnosis E11.621 Type 2 diabetes mellitus with foot ulcer 06/07/2020 No Yes L97.522 Non-pressure chronic ulcer of other part of left foot with fat layer 06/07/2020 No Yes exposed L97.512 Non-pressure chronic ulcer of other part of right foot with fat layer 06/07/2020 No Yes exposed E11.40 Type 2 diabetes mellitus with diabetic neuropathy, unspecified 06/07/2020 No Yes Inactive Problems Resolved Problems Electronic Signature(s) Signed: 07/28/2020 3:45:59 PM By: Lenda Kelp PA-C Entered By: Lenda Kelp on 07/28/2020 15:45:59 Feger, Antinio ([id number]) -------------------------------------------------------------------------------- Progress Note Details Patient Name: [name] Date of Service: 07/28/2020 3:30 PM Medical Record Number: [id number] Patient Account Number: [id number] Date of Birth/Sex: 1961-11-10 (59 y.o. M) Treating RN: Rodell Perna Primary  Care Provider: Roosvelt Maser Other Clinician: Referring Provider: Roosvelt Maser Treating Provider/Extender: Linwood Dibbles, Alexismarie Flaim Weeks in Treatment: 7 Subjective Chief Complaint Information obtained from Patient Left foot/toe ulcers History of Present Illness (HPI) 06/10/2020 upon evaluation today patient appears to be doing poorly in regard to his right plantar foot, left great toe, and left second toe. Unfortunately he has significant wounds at these locations but especially in regard to the second toe which does have a lot of necrotic tissue over the distal portion of the toe. The great toe does have some necrotic tissue as well. With that being said the patient tells me that this has been going on for 1-2 months and seems to be getting worse especially in regard to the toe. He has not had any specific x-rays to identify obvious signs of osteomyelitis up to this point that something would likely get a different today as well. With that being said he also has not had any current arterial studies which I think is something else that we probably will look into performing at this point. He does have a history of diabetes mellitus type 2, and diabetic neuropathy. Currently he has been using normal shoes for ambulation though he does have some kind of external device so that he does not have to have actual still toed boots anymore at work which he states has helped nonetheless we is at work he does have to have something that protects his feet as such per policy. He tells me he is having to do a lot more walking right now compared to normal but he can try to see if he can change something in that regard. 06/16/2020 upon evaluation today patient appears to be doing well all things considered with regard to his wounds. I do not feel like anything is it any worse and overall I feel like the Bactrim has done well. The culture that I reviewed did not reveal any specific organisms unfortunately that would help  tailor treatment. I did actually review the x-ray as well it does appear that he has evidence of osteomyelitis of the distal tuft of the second toe left foot. That was discussed with him today. He likely will need a referral to infectious disease. 06/30/2020 on evaluation today patient appears to be doing well at this time in regard to his wounds all things considered. The erythema has improved he does have evidence of osteomyelitis again the second toe on the left foot but at the other sites  there were no obvious signs. He does have wounds bilaterally on his feet and toes. These are to require sharp debridement today. He is still pending as far as his appointment with infectious disease. With that being said this is actually scheduled for 29 July which is this month. He also has an appointment on the 27th with vascular. Overall at least we are getting to the point where I think we are getting something is taken care of here. 07/12/2020 patient did have his appointment with [facility] infectious disease. They have placed him on medications which include Flagyl 500 mg and Levaquin 750 mg for the next several weeks until September 9. Other than that he overall seems to be looking quite well and I am very pleased with how things are progressing at this point. 07/21/2020 on evaluation today patient appears to be doing well with regard to his wounds. He is on Flagyl as well as Levaquin which is helping to take care of the infection it appears. Fortunately there is no signs of active infection systemically at this time which is also good news. I am very pleased with where things stand. 07/28/20 on evaluation today patient appears to be doing well with regard to his wounds. He has been tolerating the dressing changes without complication we've been using the alginate currently. With that being said I do feel like the patient is making good progress albeit slow. Objective Constitutional Well-nourished and well-hydrated  in no acute distress. Vitals Time Taken: 3:30 PM, Height: 72 in, Weight: 249 lbs, BMI: 33.8, Temperature: 98.4 F, Pulse: 98 bpm, Respiratory Rate: 16 breaths/min, Blood Pressure: 135/75 mmHg. Respiratory normal breathing without difficulty. Psychiatric this patient is able to make decisions and demonstrates good insight into disease process. Alert and Oriented x 3. pleasant and cooperative. General Notes: Patient's wounds currently did require some sharp debridement in regard to the left first toe and the right foot. Both had callus Sacra, Sem ([id number]) around the edges as well as slough did need to be removed in a good subcutaneous tissue he tolerated that today without complication post debridement wound bed appears to be doing much better which is great news. Integumentary (Hair, Skin) Wound #1 status is Open. Original cause of wound was Trauma. The wound is located on the Right,Plantar Foot. The wound measures 0.4cm length x 0.4cm width x 0.7cm depth; 0.126cm^2 area and 0.088cm^3 volume. There is Fat Layer (Subcutaneous Tissue) Exposed exposed. There is no tunneling or undermining noted. There is a medium amount of serosanguineous drainage noted. The wound margin is flat and intact. There is large (67-100%) pink granulation within the wound bed. There is no necrotic tissue within the wound bed. Wound #2 status is Open. Original cause of wound was Gradually Appeared. The wound is located on the [employer]. The wound measures 0.6cm length x 0.7cm width x 0.5cm depth; 0.33cm^2 area and 0.165cm^3 volume. There is Fat Layer (Subcutaneous Tissue) Exposed exposed. There is no tunneling or undermining noted. There is a medium amount of serous drainage noted. The wound margin is epibole. There is no granulation within the wound bed. There is a large (67-100%) amount of necrotic tissue within the wound bed including Adherent Slough. Wound #3 status is Healed - Epithelialized.  Original cause of wound was Gradually Appeared. The wound is located on the Left,Circumferential Toe Second. The wound measures 0cm length x 0cm width x 0cm depth; 0cm^2 area and 0cm^3 volume. There is Fat Layer (Subcutaneous Tissue) Exposed exposed. There is no  tunneling or undermining noted. There is a small amount of serosanguineous drainage noted. The wound margin is flat and intact. There is large (67-100%) red granulation within the wound bed. There is no necrotic tissue within the wound bed. Assessment Active Problems ICD-10 Type 2 diabetes mellitus with foot ulcer Non-pressure chronic ulcer of other part of left foot with fat layer exposed Non-pressure chronic ulcer of other part of right foot with fat layer exposed Type 2 diabetes mellitus with diabetic neuropathy, unspecified Procedures Wound #1 Pre-procedure diagnosis of Wound #1 is a Trauma, Other located on the Right,Plantar Foot .Severity of Tissue Pre Debridement is: Fat layer exposed. There was a Excisional Skin/Subcutaneous Tissue Debridement with a total area of 0.16 sq cm performed by [name], Khloie Hamada E., PA-C. With the following instrument(s): Curette Material removed includes Callus, Subcutaneous Tissue, and Slough. A time out was conducted at 16:04, prior to the start of the procedure. A Moderate amount of bleeding was controlled with Pressure. The procedure was tolerated well. Post Debridement Measurements: 0.4cm length x 0.5cm width x 0.7cm depth; 0.11cm^3 volume. Character of Wound/Ulcer Post Debridement is stable. Severity of Tissue Post Debridement is: Fat layer exposed. Post procedure Diagnosis Wound #1: Same as Pre-Procedure Wound #2 Pre-procedure diagnosis of Wound #2 is a Diabetic Wound/Ulcer of the Lower Extremity located on the Left,Anterior Toe Great .Severity of Tissue Pre Debridement is: Fat layer exposed. There was a Excisional Skin/Subcutaneous Tissue Debridement with a total area of 0.42 sq cm performed by  [name], Chanc Kervin E., PA-C. With the following instrument(s): Curette to remove Viable and Non-Viable tissue/material. Material removed includes Callus, Subcutaneous Tissue, and Slough. A time out was conducted at 16:04, prior to the start of the procedure. A Moderate amount of bleeding was controlled with Pressure. The procedure was tolerated well. Post Debridement Measurements: 0.7cm length x 0.7cm width x 0.6cm depth; 0.231cm^3 volume. Character of Wound/Ulcer Post Debridement is stable. Severity of Tissue Post Debridement is: Fat layer exposed. Post procedure Diagnosis Wound #2: Same as Pre-Procedure Plan Wound Cleansing: Wound #1 Right,Plantar Foot: Clean wound with Normal Saline. Wound #2 Left,Anterior Toe Great: Clean wound with Normal Saline. Primary Wound Dressing: Wound #1 Right,Plantar Foot: Silver Alginate Pirro, Calyn ([id number]) Wound #2 Left,Anterior Toe Great: Silver Alginate Secondary Dressing: Wound #1 Right,Plantar Foot: Gauze and Kerlix/Conform Wound #2 Left,Anterior Toe Great: Gauze and Kerlix/Conform Dressing Change Frequency: Wound #1 Right,Plantar Foot: Change Dressing Monday, Wednesday, Friday Wound #2 Left,Anterior Toe Great: Change Dressing Monday, Wednesday, Friday Follow-up Appointments: Wound #1 Right,Plantar Foot: Return Appointment in 1 week. Wound #2 Left,Anterior Toe Great: Return Appointment in 1 week. Edema Control: Wound #1 Right,Plantar Foot: Elevate legs to the level of the heart and pump ankles as often as possible Wound #2 Left,Anterior Toe Great: Elevate legs to the level of the heart and pump ankles as often as possible Off-Loading: Wound #1 Right,Plantar Foot: Open toe surgical shoe Wound #2 Left,Anterior Toe Great: Open toe surgical shoe Additional Orders / Instructions: Wound #1 Right,Plantar Foot: Increase protein intake. Wound #2 Left,Anterior Toe Great: Increase protein intake. Medications-please add to medication  list.: Wound #1 Right,Plantar Foot: P.O. Antibiotics - Continue Levaquin and Flagyl Wound #2 Left,Anterior Toe Great: P.O. Antibiotics - Continue Levaquin and Flagyl 1. I would recommend currently that we going continue with the silver alginate dressing I think that still a good option for him. 2. The patient will continue with his current wound care measures with regard to the antibiotics prescribed by infectious disease he seems  to be doing great in that regard. 3. I'm also can recommend the patient continue to try to keep pressure off of his toes and foot is much as possible obviously has to work but he has done some things to wear something external on his shoe as opposed to a steel toe shoe to try to help in that regard as well. We will see patient back for reevaluation in 1 week here in the clinic. If anything worsens or changes patient will contact our office for additional recommendations. Electronic Signature(s) Signed: 07/28/2020 4:40:22 PM By: Lenda Kelp PA-C Entered By: Lenda Kelp on 07/28/2020 16:40:22 Mondor, Hersey ([id number]) -------------------------------------------------------------------------------- SuperBill Details Patient Name: [name] Date of Service: 07/28/2020 Medical Record Number: [id number] Patient Account Number: [id number] Date of Birth/Sex: [birth year] (59 y.o. 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 E11.40 Type 2 diabetes mellitus with diabetic neuropathy, unspecified Facility Procedures CPT4 Code: 17408144 Description: 11042 - DEB  SUBQ TISSUE 20 SQ CM/< Modifier: Quantity: 1 CPT4 Code: Description: ICD-10 Diagnosis Description L97.522 Non-pressure chronic ulcer of other part of left foot with fat layer exp L97.512 Non-pressure chronic ulcer of other part of right foot with fat layer ex Modifier: osed posed Quantity: Physician Procedures CPT4 Code: 8185631 Description: 11042 - WC PHYS SUBQ TISS 20 SQ CM Modifier: Quantity: 1 CPT4 Code: Description: ICD-10 Diagnosis Description L97.522 Non-pressure chronic ulcer of other part of left foot with fat layer exp L97.512 Non-pressure chronic ulcer of other part of right foot with fat layer ex Modifier: osed posed Quantity: Electronic Signature(s) Signed: 07/28/2020 4:41:08 PM By: Lenda Kelp PA-C Entered By: Lenda Kelp on 07/28/2020 16:41:08

## 2020-07-29 NOTE — Progress Notes (Signed)
PANFILO, KETCHUM ([id number]) Visit Report for 07/28/2020 Arrival Information Details Patient Name: [name], [name] Date of Service: 07/28/2020 3:30 PM Medical Record Number: [id number] Patient Account Number: [id number] Date of Birth/Sex: [birth year] (59 y.o. M) Treating RN: Cornell Barman Primary Care Fabrizzio Marcella: Merrie Roof Other Clinician: Referring Dorothea Yow: Merrie Roof Treating Adewale Pucillo/Extender: Melburn Hake, HOYT Weeks in Treatment: 7 Visit Information History Since Last Visit Added or deleted any medications: No Patient Arrived: Ambulatory Had a fall or experienced change in No Arrival Time: 15:29 activities of daily living that may affect Accompanied By: self risk of falls: Transfer Assistance: None Hospitalized since last visit: No Patient Requires Transmission-Based Precautions: No Pain Present Now: No Patient Has Alerts: Yes Patient Alerts: Type II Diabetic Electronic Signature(s) Signed: 07/28/2020 6:39:29 PM By: Gretta Cool, BSN, RN, CWS, Kim RN, BSN Entered By: Gretta Cool, BSN, RN, CWS, Kim on 07/28/2020 15:30:06 Espericueta, Dvon ([id number]) -------------------------------------------------------------------------------- [name] Date of Service: 07/28/2020 3:30 PM Medical Record Number: [id number] Patient Account Number: [id number] Date of Birth/Sex: 1961-10-22 (59 y.o. M) Treating RN: Cornell Barman Primary Care Dianelys Scinto: Merrie Roof Other Clinician: Referring Lissandra Keil: Merrie Roof Treating Kiki Bivens/Extender: Melburn Hake, HOYT Weeks in Treatment: 7 Encounter Discharge Information Items Post Procedure Vitals Discharge Condition: Stable Unable to obtain vitals Reason: . Ambulatory Status: Ambulatory Discharge Destination: Home Transportation: Private Auto Accompanied By: self Schedule Follow-up Appointment: Yes Clinical Summary of Care: Electronic Signature(s) Signed: 07/28/2020 6:39:29 PM By: Gretta Cool, BSN, RN, CWS, Kim RN,  BSN Entered By: Gretta Cool, BSN, RN, CWS, Kim on 07/28/2020 16:09:21 Galluzzo, Ordell ([id number]) -------------------------------------------------------------------------------- Lower Extremity Assessment Details Patient Name: [name] Date of Service: 07/28/2020 3:30 PM Medical Record Number: [id number] Patient Account Number: [id number] Date of Birth/Sex: January 11, 1961 (59 y.o. M) Treating RN: Cornell Barman Primary Care Sunday Klos: Merrie Roof Other Clinician: Referring Lankford Gutzmer: Merrie Roof Treating Rosaura Bolon/Extender: Melburn Hake, HOYT Weeks in Treatment: 7 Edema Assessment Assessed: [Left: Yes] [Right: Yes] Edema: [Left: No] [Right: No] Calf Left: Right: Point of Measurement: cm From Medial Instep 36 cm 40.5 cm Ankle Left: Right: Point of Measurement: cm From Medial Instep 24.5 cm 23 cm Vascular Assessment Pulses: Dorsalis Pedis Palpable: [Left:Yes] [Right:Yes] Posterior Tibial Palpable: [Left:Yes] [Right:Yes] Electronic Signature(s) Signed: 07/28/2020 6:39:29 PM By: Gretta Cool, BSN, RN, CWS, Kim RN, BSN Entered By: Gretta Cool, BSN, RN, CWS, Kim on 07/28/2020 15:42:02 [place], Hampton ([id number]) -------------------------------------------------------------------------------- Multi Wound Chart Details Patient Name: [name] Date of Service: 07/28/2020 3:30 PM Medical Record Number: [id number] Patient Account Number: [id number] Date of Birth/Sex: [birth year] (59 y.o. M) Treating RN: Cornell Barman Primary Care Holiday Mcmenamin: Merrie Roof Other Clinician: Referring Daylah Sayavong: Merrie Roof Treating Dynver Clemson/Extender: Melburn Hake, HOYT Weeks in Treatment: 7 Vital Signs Height(in): 72 Pulse(bpm): 98 Weight(lbs): 249 Blood Pressure(mmHg): 135/75 Body Mass Index(BMI): 34 Temperature(F): 98.4 Respiratory Rate(breaths/min): 16 Photos: Wound Location: Right, Plantar Foot Left, Anterior Toe Great Left, Circumferential Toe Second Wounding Event: Trauma Gradually Appeared Gradually Appeared Primary  Etiology: Trauma, Other Diabetic Wound/Ulcer of the Lower Diabetic Wound/Ulcer of the Lower Extremity Extremity Secondary Etiology: Diabetic Wound/Ulcer of the Lower N/A N/A Extremity Comorbid History: Cataracts, Type II Diabetes, Cataracts, Type II Diabetes, Cataracts, Type II Diabetes, Neuropathy Neuropathy Neuropathy Date Acquired: 05/17/2020 05/07/2020 05/17/2020 Weeks of Treatment: '7 7 7 ' Wound Status: Open Healed - Epithelialized Open Measurements L x W x D (cm) 0.6x0.7x0.5 0x0x0 0.4x0.4x0.7 Area (cm) : 0.33 0 0.126 Volume (cm) : 0.165 0 0.088 % Reduction in Area: -39.80% 100.00% 99.60% % Reduction in Volume: -132.40% 100.00% 99.90% Classification: Full Thickness Without  Exposed Grade 1 Grade 2 Support Structures Exudate Amount: Medium Medium Small Exudate Type: Serosanguineous Serous Serosanguineous Exudate Color: red, brown amber red, brown Wound Margin: Flat and Intact Epibole Flat and Intact Granulation Amount: Large (67-100%) None Present (0%) Large (67-100%) Granulation Quality: Pink N/A Red Necrotic Amount: None Present (0%) Large (67-100%) None Present (0%) Exposed Structures: Fat Layer (Subcutaneous Tissue) Fat Layer (Subcutaneous Tissue) Fat Layer (Subcutaneous Tissue) Exposed: Yes Exposed: Yes Exposed: Yes Fascia: No Fascia: No Fascia: No Tendon: No Tendon: No Tendon: No Muscle: No Muscle: No Muscle: No Joint: No Joint: No Joint: No Bone: No Bone: No Bone: No Epithelialization: Small (1-33%) None Large (67-100%) Treatment Notes Electronic Signature(s) Signed: 07/28/2020 6:39:29 PM By: Gretta Cool, BSN, RN, CWS, Kim RN, BSN Entered By: Gretta Cool, BSN, RN, CWS, Kim on 07/28/2020 16:01:08 Artman, Logun ([id number]) Gassett, Eduin ([id number]) -------------------------------------------------------------------------------- Multi-Disciplinary Care Plan Details Patient Name: [name] Date of Service: 07/28/2020 3:30 PM Medical Record Number: [id number] Patient  Account Number: [id number] Date of Birth/Sex: 1961/04/20 (59 y.o. M) Treating RN: Cornell Barman Primary Care Mamie Hundertmark: Merrie Roof Other Clinician: Referring Kitrina Maurin: Merrie Roof Treating Angalena Cousineau/Extender: Melburn Hake, HOYT Weeks in Treatment: 7 Active Inactive Osteomyelitis Nursing Diagnoses: Potential for infection: osteomyelitis Goals: Diagnostic evaluation for osteomyelitis completed as ordered Date Initiated: 06/07/2020 Date Inactivated: 07/12/2020 Target Resolution Date: 07/06/2020 Goal Status: Met Patient/caregiver will verbalize understanding of disease process and disease management Date Initiated: 06/07/2020 Date Inactivated: 07/12/2020 Target Resolution Date: 07/06/2020 Goal Status: Met Patient's osteomyelitis will resolve Date Initiated: 06/07/2020 Target Resolution Date: 08/18/2020 Goal Status: Active Signs and symptoms for osteomyelitis will be recognized and promptly addressed Date Initiated: 06/07/2020 Date Inactivated: 07/12/2020 Target Resolution Date: 07/06/2020 Goal Status: Met Interventions: Assess for signs and symptoms of osteomyelitis resolution every visit Provide education on osteomyelitis Notes: Wound/Skin Impairment Nursing Diagnoses: Impaired tissue integrity Goals: Patient/caregiver will verbalize understanding of skin care regimen Date Initiated: 06/07/2020 Date Inactivated: 07/12/2020 Target Resolution Date: 07/06/2020 Goal Status: Met Ulcer/skin breakdown will have a volume reduction of 30% by week 4 Date Initiated: 06/07/2020 Date Inactivated: 07/12/2020 Target Resolution Date: 07/06/2020 Goal Status: Met Ulcer/skin breakdown will have a volume reduction of 50% by week 8 Date Initiated: 07/28/2020 Target Resolution Date: 08/06/2020 Goal Status: Active Interventions: Assess patient/caregiver ability to obtain necessary supplies Assess ulceration(s) every visit Treatment Activities: Referred to DME Raynee Mccasland for dressing supplies :  06/07/2020 Notes: Electronic Signature(s) Signed: 07/28/2020 6:39:29 PM By: Gretta Cool, BSN, RN, CWS, Kim RN, BSN [employer], [employer] ([id number]) Entered By: Gretta Cool, BSN, RN, CWS, Kim on 07/28/2020 16:00:51 Vaughan, Arjay ([id number]) -------------------------------------------------------------------------------- Pain Assessment Details Patient Name: [name] Date of Service: 07/28/2020 3:30 PM Medical Record Number: [id number] Patient Account Number: [id number] Date of Birth/Sex: 08/23/1961 (59 y.o. M) Treating RN: Cornell Barman Primary Care Santos Sollenberger: Merrie Roof Other Clinician: Referring Mariano Doshi: Merrie Roof Treating Nashaly Dorantes/Extender: Melburn Hake, HOYT Weeks in Treatment: 7 Active Problems Location of Pain Severity and Description of Pain Patient Has Paino No Site Locations Pain Management and Medication Current Pain Management: Electronic Signature(s) Signed: 07/28/2020 6:39:29 PM By: Gretta Cool, BSN, RN, CWS, Kim RN, BSN Entered By: Gretta Cool, BSN, RN, CWS, Kim on 07/28/2020 15:30:48 Glazier, Johnthomas ([id number]) -------------------------------------------------------------------------------- Patient/Caregiver Education Details Patient Name: [name] Date of Service: 07/28/2020 3:30 PM Medical Record Number: [id number] Patient Account Number: [id number] Date of Birth/Gender: 1961/02/10 (59 y.o. M) Treating RN: Cornell Barman Primary Care Physician: Merrie Roof Other Clinician: Referring Physician: Merrie Roof Treating Physician/Extender: Sharalyn Ink in Treatment: 7 Education Assessment Education Provided To: Patient  Education Topics Provided Infection: Handouts: Infection Prevention and Management, Other: continue antibiotics Methods: Demonstration, Explain/Verbal Responses: State content correctly Wound/Skin Impairment: Handouts: Caring for Your Ulcer, Other: wound care as prescribed Methods: Demonstration, Explain/Verbal Responses: State content correctly Electronic  Signature(s) Signed: 07/28/2020 6:39:29 PM By: Gretta Cool, BSN, RN, CWS, Kim RN, BSN Entered By: Gretta Cool, BSN, RN, CWS, Kim on 07/28/2020 16:08:30 Osentoski, Rollin ([id number]) -------------------------------------------------------------------------------- Wound Assessment Details Patient Name: [name] Date of Service: 07/28/2020 3:30 PM Medical Record Number: [id number] Patient Account Number: [id number] Date of Birth/Sex: June 08, 1961 (59 y.o. M) Treating RN: Cornell Barman Primary Care Carinne Brandenburger: Merrie Roof Other Clinician: Referring Kyan Giannone: Merrie Roof Treating Shevy Yaney/Extender: Melburn Hake, HOYT Weeks in Treatment: 7 Wound Status Wound Number: 1 Primary Etiology: Trauma, Other Wound Location: Right, Plantar Foot Secondary Etiology: Diabetic Wound/Ulcer of the Lower Extremity Wounding Event: Trauma Wound Status: Open Date Acquired: 05/17/2020 Comorbid History: Cataracts, Type II Diabetes, Neuropathy Weeks Of Treatment: 7 Clustered Wound: No Photos Wound Measurements Length: (cm) 0.4 Width: (cm) 0.4 Depth: (cm) 0.7 Area: (cm) 0.126 Volume: (cm) 0.088 % Reduction in Area: 46.6% % Reduction in Volume: -23.9% Epithelialization: Small (1-33%) Tunneling: No Undermining: No Wound Description Classification: Full Thickness Without Exposed Support Structu Wound Margin: Flat and Intact Exudate Amount: Medium Exudate Type: Serosanguineous Exudate Color: red, brown res Foul Odor After Cleansing: No Slough/Fibrino No Wound Bed Granulation Amount: Large (67-100%) Exposed Structure Granulation Quality: Pink Fascia Exposed: No Necrotic Amount: None Present (0%) Fat Layer (Subcutaneous Tissue) Exposed: Yes Tendon Exposed: No Muscle Exposed: No Joint Exposed: No Bone Exposed: No Treatment Notes Wound #1 (Right, Plantar Foot) Notes scell, conform Electronic Signature(s) Signed: 07/28/2020 6:39:29 PM By: Gretta Cool, BSN, RN, CWS, Kim RN, BSN [place], Taedyn ([id number]) Entered By:  Gretta Cool, BSN, RN, CWS, Kim on 07/28/2020 16:03:18 Gawthrop, Kendyn ([id number]) -------------------------------------------------------------------------------- Wound Assessment Details Patient Name: [name] Date of Service: 07/28/2020 3:30 PM Medical Record Number: [id number] Patient Account Number: [id number] Date of Birth/Sex: 13-Nov-1961 (59 y.o. M) Treating RN: Cornell Barman Primary Care Tinsley Everman: Merrie Roof Other Clinician: Referring Chonita Gadea: Merrie Roof Treating Blayne Garlick/Extender: Melburn Hake, HOYT Weeks in Treatment: 7 Wound Status Wound Number: 2 Primary Etiology: Diabetic Wound/Ulcer of the Lower Extremity Wound Location: Left, Anterior Toe Great Wound Status: Open Wounding Event: Gradually Appeared Comorbid History: Cataracts, Type II Diabetes, Neuropathy Date Acquired: 05/07/2020 Weeks Of Treatment: 7 Clustered Wound: No Photos Wound Measurements Length: (cm) 0.6 Width: (cm) 0.7 Depth: (cm) 0.5 Area: (cm) 0.33 Volume: (cm) 0.165 % Reduction in Area: 74.5% % Reduction in Volume: 36.3% Epithelialization: None Tunneling: No Undermining: No Wound Description Classification: Grade 1 Wound Margin: Epibole Exudate Amount: Medium Exudate Type: Serous Exudate Color: amber Foul Odor After Cleansing: No Slough/Fibrino Yes Wound Bed Granulation Amount: None Present (0%) Exposed Structure Necrotic Amount: Large (67-100%) Fascia Exposed: No Necrotic Quality: Adherent Slough Fat Layer (Subcutaneous Tissue) Exposed: Yes Tendon Exposed: No Muscle Exposed: No Joint Exposed: No Bone Exposed: No Treatment Notes Wound #2 (Left, Anterior Toe Great) Notes scell, conform Electronic Signature(s) Signed: 07/28/2020 6:39:29 PM By: Gretta Cool, BSN, RN, CWS, Kim RN, BSN [place], Terius ([id number]) Entered By: Gretta Cool, BSN, RN, CWS, Kim on 07/28/2020 16:03:19 Blagg, Kiyon ([id number]) -------------------------------------------------------------------------------- Wound  Assessment Details Patient Name: [name] Date of Service: 07/28/2020 3:30 PM Medical Record Number: [id number] Patient Account Number: [id number] Date of Birth/Sex: 1961-06-09 (59 y.o. M) Treating RN: Cornell Barman Primary Care Zealand Boyett: Merrie Roof Other Clinician: Referring Tai Syfert: Merrie Roof Treating Osamah Schmader/Extender: Melburn Hake, HOYT Weeks in Treatment: 7 Wound Status Wound Number:  3 Primary Etiology: Diabetic Wound/Ulcer of the Lower Extremity Wound Location: Left, Circumferential Toe Second Wound Status: Healed - Epithelialized Wounding Event: Gradually Appeared Comorbid History: Cataracts, Type II Diabetes, Neuropathy Date Acquired: 05/17/2020 Weeks Of Treatment: 7 Clustered Wound: No Photos Wound Measurements Length: (cm) 0 Width: (cm) 0 Depth: (cm) 0 Area: (cm) 0 Volume: (cm) 0 % Reduction in Area: 100% % Reduction in Volume: 100% Epithelialization: Large (67-100%) Tunneling: No Undermining: No Wound Description Classification: Grade 2 Wound Margin: Flat and Intact Exudate Amount: Small Exudate Type: Serosanguineous Exudate Color: red, brown Foul Odor After Cleansing: No Slough/Fibrino No Wound Bed Granulation Amount: Large (67-100%) Exposed Structure Granulation Quality: Red Fascia Exposed: No Necrotic Amount: None Present (0%) Fat Layer (Subcutaneous Tissue) Exposed: Yes Tendon Exposed: No Muscle Exposed: No Joint Exposed: No Bone Exposed: No Electronic Signature(s) Signed: 07/28/2020 6:39:29 PM By: Gretta Cool, BSN, RN, CWS, Kim RN, BSN Entered By: Gretta Cool, BSN, RN, CWS, Kim on 07/28/2020 16:03:20 Reppucci, Ziere ([id number]) -------------------------------------------------------------------------------- [place] Details Patient Name: [name] Date of Service: 07/28/2020 3:30 PM Medical Record Number: [id number] Patient Account Number: [id number] Date of Birth/Sex: 27-Jun-1961 (59 y.o. M) Treating RN: Cornell Barman Primary Care Shlonda Dolloff: Merrie Roof  Other Clinician: Referring Marios Gaiser: Merrie Roof Treating Amarian Botero/Extender: Melburn Hake, HOYT Weeks in Treatment: 7 Vital Signs Time Taken: 15:30 Temperature (F): 98.4 Height (in): 72 Pulse (bpm): 98 Weight (lbs): 249 Respiratory Rate (breaths/min): 16 Body Mass Index (BMI): 33.8 Blood Pressure (mmHg): 135/75 Reference Range: 80 - 120 mg / dl Electronic Signature(s) Signed: 07/28/2020 6:39:29 PM By: Gretta Cool, BSN, RN, CWS, Kim RN, BSN Entered By: Gretta Cool, BSN, RN, CWS, Kim on 07/28/2020 15:30:40

## 2020-08-04 ENCOUNTER — Encounter: Payer: BC Managed Care – PPO | Admitting: Physician Assistant

## 2020-08-04 DIAGNOSIS — L97512 Non-pressure chronic ulcer of other part of right foot with fat layer exposed: Secondary | ICD-10-CM | POA: Diagnosis not present

## 2020-08-04 DIAGNOSIS — E11621 Type 2 diabetes mellitus with foot ulcer: Secondary | ICD-10-CM | POA: Diagnosis not present

## 2020-08-04 DIAGNOSIS — L97526 Non-pressure chronic ulcer of other part of left foot with bone involvement without evidence of necrosis: Secondary | ICD-10-CM | POA: Diagnosis not present

## 2020-08-04 DIAGNOSIS — L97522 Non-pressure chronic ulcer of other part of left foot with fat layer exposed: Secondary | ICD-10-CM | POA: Diagnosis not present

## 2020-08-04 DIAGNOSIS — E1140 Type 2 diabetes mellitus with diabetic neuropathy, unspecified: Secondary | ICD-10-CM | POA: Diagnosis not present

## 2020-08-04 NOTE — Progress Notes (Addendum)
[name], Brixton ([id number]) Visit Report for 08/04/2020 Chief Complaint Document Details Patient Name: [name], [name] Date of Service: 08/04/2020 3:15 PM Medical Record Number: [id number] Patient Account Number: [id number] Date of Birth/Sex: 11/30/1961 (59 y.o. M) Treating RN: Huel CoventryWoody, Kim Primary Care Provider: Roosvelt MaserLANE, RACHEL Other Clinician: Referring Provider: Roosvelt MaserLANE, RACHEL Treating Provider/Extender: Linwood DibblesSTONE III, [name]: 8 Information Obtained from: Patient Chief Complaint Left foot/toe ulcers Electronic Signature(s) Signed: 08/04/2020 3:39:22 PM By: Lenda KelpStone III, Beacher Every PA-C Entered By: Lenda KelpStone III, Narya Beavin on 08/04/2020 15:39:22 Weyman, [name] ([id number]) -------------------------------------------------------------------------------- Debridement Details Patient Name: [name], [name] Date of Service: 08/04/2020 3:15 PM Medical Record Number: [id number] Patient Account Number: [id number] Date of Birth/Sex: 06/08/1961 (59 y.o. M) Treating RN: Tyler AasButler, Michelle Primary Care Provider: Roosvelt MaserLANE, RACHEL Other Clinician: Referring Provider: Roosvelt MaserLANE, RACHEL Treating Provider/Extender: Linwood DibblesSTONE III, Albertha Beattie [name] in [name]: 8 Debridement Performed for Wound #1 Right,Plantar Foot Assessment: Performed By: Physician [name], [name] Shin E., PA-C Debridement Type: Debridement Severity of Tissue Pre Debridement: Fat layer exposed Level of Consciousness (Pre- Awake and Alert procedure): Pre-procedure Verification/Time Out Yes - 15:52 Taken: Start Time: 15:53 Pain Control: Lidocaine Total Area Debrided (L x W): 0.6 (cm) x 0.5 (cm) = 0.3 (cm) Tissue and other material Callus, Slough, Subcutaneous, Slough debrided: Level: Skin/Subcutaneous Tissue Debridement Description: Excisional Instrument: Curette Bleeding: Moderate Hemostasis Achieved: Pressure End Time: 15:54 Procedural Pain: 0 Post Procedural Pain: 0 Response to [name]: Procedure was tolerated well Level of Consciousness  (Post- Awake and Alert procedure): Post Debridement Measurements of Total Wound Length: (cm) 0.6 Width: (cm) 0.5 Depth: (cm) 0.6 Volume: (cm) 0.141 Character of Wound/Ulcer Post Debridement: Improved Severity of Tissue Post Debridement: Fat layer exposed Post Procedure Diagnosis Same as Pre-procedure Electronic Signature(s) Signed: 08/04/2020 4:54:16 PM By: Tyler AasButler, Michelle Signed: 08/04/2020 5:28:41 PM By: Lenda KelpStone III, Glorine Hanratty PA-C Entered By: Tyler AasButler, Michelle on 08/04/2020 15:56:59 [name], [name] ([id number]) -------------------------------------------------------------------------------- Debridement Details Patient Name: [name], [name] Date of Service: 08/04/2020 3:15 PM Medical Record Number: [id number] Patient Account Number: [id number] Date of Birth/Sex: 04/29/1961 (59 y.o. M) Treating RN: Tyler AasButler, Michelle Primary Care Provider: Roosvelt MaserLANE, RACHEL Other Clinician: Referring Provider: Roosvelt MaserLANE, RACHEL Treating Provider/Extender: Linwood DibblesSTONE III, [name] [name] in [name]: 8 Debridement Performed for Wound #2 Left,Anterior Toe Great Assessment: Performed By: Physician [name], [name] Wandel E., PA-C Debridement Type: Debridement Severity of Tissue Pre Debridement: Fat layer exposed Level of Consciousness (Pre- Awake and Alert procedure): Pre-procedure Verification/Time Out Yes - 15:55 Taken: Start Time: 15:56 Pain Control: Lidocaine Total Area Debrided (L x W): 1 (cm) x 1.3 (cm) = 1.3 (cm) Tissue and other material Callus, Subcutaneous debrided: Level: Skin/Subcutaneous Tissue Debridement Description: Excisional Instrument: Curette Bleeding: Minimum Hemostasis Achieved: Pressure End Time: 15:58 Procedural Pain: 0 Post Procedural Pain: 0 Response to [name]: Procedure was tolerated well Level of Consciousness (Post- Awake and Alert procedure): Post Debridement Measurements of Total Wound Length: (cm) 1 Width: (cm) 1.3 Depth: (cm) 0.5 Volume: (cm) 0.511 Character of Wound/Ulcer  Post Debridement: Improved Severity of Tissue Post Debridement: Bone involvement without necrosis Post Procedure Diagnosis Same as Pre-procedure Electronic Signature(s) Signed: 08/04/2020 4:54:16 PM By: Tyler AasButler, Michelle Signed: 08/04/2020 5:28:41 PM By: Lenda KelpStone III, Hersey Maclellan PA-C Entered By: Tyler AasButler, Michelle on 08/04/2020 15:59:58 [name], [name] ([id number]) -------------------------------------------------------------------------------- HPI Details Patient Name: [name], [name] Date of Service: 08/04/2020 3:15 PM Medical Record Number: [id number] Patient Account Number: [id number] Date of Birth/Sex: 07/13/1961 (59 y.o. M) Treating RN: Huel CoventryWoody, Kim Primary Care Provider: Roosvelt MaserLANE, RACHEL Other Clinician: Referring Provider: Roosvelt MaserLANE, RACHEL Treating Provider/Extender: Linwood DibblesSTONE III, [name] [name] in [name]: 8 History  of Present Illness HPI Description: 06/10/2020 upon evaluation today patient appears to be doing poorly in regard to his right plantar foot, left great toe, and left second toe. Unfortunately he has significant wounds at these locations but especially in regard to the second toe which does have a lot of necrotic tissue over the distal portion of the toe. The great toe does have some necrotic tissue as well. With that being said the patient tells me that this has been going on for 1-2 months and seems to be getting worse especially in regard to the toe. He has not had any specific x-rays to identify obvious signs of osteomyelitis up to this point that something would likely get a different today as well. With that being said he also has not had any current arterial studies which I think is something else that we probably will look into performing at this point. He does have a history of diabetes mellitus type 2, and diabetic neuropathy. Currently he has been using normal shoes for ambulation though he does have some kind of external device so that he does not have to have actual still toed boots anymore  at work which he states has helped nonetheless we is at work he does have to have something that protects his feet as such per policy. He tells me he is having to do a lot more walking right now compared to normal but he can try to see if he can change something in that regard. 06/16/2020 upon evaluation today patient appears to be doing well all things considered with regard to his wounds. I do not feel like anything is it any worse and overall I feel like the Bactrim has done well. The culture that I reviewed did not reveal any specific organisms unfortunately that would help tailor [name]. I did actually review the x-ray as well it does appear that he has evidence of osteomyelitis of the distal tuft of the second toe left foot. That was discussed with him today. He likely will need a referral to infectious disease. 06/30/2020 on evaluation today patient appears to be doing well at this time in regard to his wounds all things considered. The erythema has improved he does have evidence of osteomyelitis again the second toe on the left foot but at the other sites there were no obvious signs. He does have wounds bilaterally on his feet and toes. These are to require sharp debridement today. He is still pending as far as his appointment with infectious disease. With that being said this is actually scheduled for 29 July which is this month. He also has an appointment on the 27th with vascular. Overall at least we are getting to the point where I think we are getting something is taken care of here. 07/12/2020 patient did have his appointment with [facility] infectious disease. They have placed him on medications which include Flagyl 500 mg and Levaquin 750 mg for the next several [name] until September 9. Other than that he overall seems to be looking quite well and I am very pleased with how things are progressing at this point. 07/21/2020 on evaluation today patient appears to be doing well with regard to his  wounds. He is on Flagyl as well as Levaquin which is helping to take care of the infection it appears. Fortunately there is no signs of active infection systemically at this time which is also good news. I am very pleased with where things stand. 07/28/20 on evaluation today patient  appears to be doing well with regard to his wounds. He has been tolerating the dressing changes without complication we've been using the alginate currently. With that being said I do feel like the patient is making good progress albeit slow. 08/04/2020 on evaluation today patient's wound actually is showing signs of good improvement which is great news and overall very pleased with where things stand. Especially in regard to the right foot. The left great toe unfortunately is still having a lot of drainage and is somewhat macerated. This actually does appear to go deeper and is actually showing signs of bone exposure in the base of the wound. Again he is on antibiotics and I think we could consider HBO therapy in fact this is something I may discuss with him next week depending on how the toe looks. With that being said in the short-term I think I am going to actually recommend that he change the dressing more frequently right has been doing every other day Electronic Signature(s) Signed: 08/04/2020 5:05:45 PM By: Lenda Kelp PA-C Entered By: Lenda Kelp on 08/04/2020 17:05:45 Dobies, [name] ([id number]) -------------------------------------------------------------------------------- Physical Exam Details Patient Name: [name] Lenz Date of Service: 08/04/2020 3:15 PM Medical Record Number: [id number] Patient Account Number: [id number] Date of Birth/Sex: Mar 09, 1961 (59 y.o. M) Treating RN: Huel Coventry Primary Care Provider: Roosvelt Maser Other Clinician: Referring Provider: Roosvelt Maser Treating Provider/Extender: Linwood Dibbles, [name] [name] in [name]: 8 Constitutional Well-nourished and well-hydrated in no  acute distress. Respiratory normal breathing without difficulty. Psychiatric this patient is able to make decisions and demonstrates good insight into disease process. Alert and Oriented x 3. pleasant and cooperative. Notes Upon inspection patient's wound bed actually showed signs of good granulation at this time. There does not appear to be significant slough buildup that I was not able to carefully clear away that there was some and I did perform debridement to both remove the slough as well as some callus and post debridement the wound beds appear to be doing better which is great news. Overall very pleased with where things stand. Electronic Signature(s) Signed: 08/04/2020 5:06:09 PM By: Lenda Kelp PA-C Entered By: Lenda Kelp on 08/04/2020 17:06:09 [name], [name] ([id number]) -------------------------------------------------------------------------------- Physician Orders Details Patient Name: [name] Lenz Date of Service: 08/04/2020 3:15 PM Medical Record Number: [id number] Patient Account Number: [id number] Date of Birth/Sex: 1961-01-10 (59 y.o. 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 Wound #2 Left,Anterior Toe Great o Clean wound with Normal Saline. Primary Wound Dressing Wound #1 Right,Plantar Foot o Silver Alginate Wound #2 Left,Anterior Toe Great o Silver Alginate Secondary  Dressing Wound #1 Right,Plantar Foot o Gauze and Kerlix/Conform Wound #2 Left,Anterior Toe Great o Gauze and Kerlix/Conform Dressing Change Frequency Wound #1 Right,Plantar Foot o Change Dressing Monday, Wednesday, Friday Wound #2 Left,Anterior Toe Great o Change dressing every day. - may changed twice daily if needed to keep dry Follow-up Appointments Wound #1 Right,Plantar Foot o Return Appointment in 1 week. Wound #2 Left,Anterior Toe Great o Return  Appointment in 1 week. Edema Control Wound #1 Right,Plantar Foot o Elevate legs to the level of the heart and pump ankles as often as possible Wound #2 Left,Anterior Toe Great o Elevate legs to the level of the heart and pump ankles as often as possible Off-Loading Wound #1 Right,Plantar Foot o Open toe surgical shoe Mauriello, Tylik ([id number]) Wound #2 Left,Anterior Toe Great o Open toe surgical shoe Additional Orders / Instructions Wound #1 Right,Plantar Foot o Increase protein intake. Wound #2 Left,Anterior Toe Great o Increase protein intake. Medications-please add to medication list. Wound #1 Right,Plantar Foot o P.O. Antibiotics - Continue Levaquin and Flagyl Wound #2 Left,Anterior Toe Great o P.O. Antibiotics - Continue Levaquin and Flagyl Electronic Signature(s) Signed: 08/04/2020 4:54:16 PM By: Tyler Aas Signed: 08/04/2020 5:28:41 PM By: Lenda Kelp PA-C Entered By: Tyler Aas on 08/04/2020 16:02:44 Knoedler, Bern ([id number]) -------------------------------------------------------------------------------- Problem List Details Patient Name: [name] Lenz Date of Service: 08/04/2020 3:15 PM Medical Record Number: [id number] Patient Account Number: [id number] Date of Birth/Sex: [birth year] (59 y.o. M) Treating RN: Huel Coventry Primary Care Provider: Roosvelt Maser Other Clinician: Referring Provider: Roosvelt Maser Treating Provider/Extender: Lenda Kelp [name] in [name]:  8 Active Problems ICD-10 Encounter Code Description Active Date MDM Diagnosis E11.621 Type 2 diabetes mellitus with foot ulcer 06/07/2020 No Yes L97.522 Non-pressure chronic ulcer of other part of left foot with fat layer 06/07/2020 No Yes exposed L97.512 Non-pressure chronic ulcer of other part of right foot with fat layer 06/07/2020 No Yes exposed E11.40 Type 2 diabetes mellitus with diabetic neuropathy, unspecified 06/07/2020 No Yes Inactive Problems Resolved Problems Electronic Signature(s) Signed: 08/04/2020 3:39:16 PM By: Lenda Kelp PA-C Entered By: Lenda Kelp on 08/04/2020 15:39:15 Mounsey, [name] ([id number]) -------------------------------------------------------------------------------- Progress Note Details Patient Name: [name] Lenz Date of Service: 08/04/2020 3:15 PM Medical Record Number: [id number] Patient Account Number: [id number] Date of Birth/Sex: 09-22-1961 (59 y.o. M) Treating RN: Huel Coventry Primary Care Provider: Roosvelt Maser Other Clinician: Referring Provider: Roosvelt Maser Treating Provider/Extender: Linwood Dibbles, Sarabi Sockwell [name] in [name]: 8 Subjective Chief Complaint Information obtained from Patient Left foot/toe ulcers History of Present Illness (HPI) 06/10/2020 upon evaluation today patient appears to be doing poorly in regard to his right plantar foot, left great toe, and left second toe. Unfortunately he has significant wounds at these locations but especially in regard to the second toe which does have a lot of necrotic tissue over the distal portion of the toe. The great toe does have some necrotic tissue as well. With that being said the patient tells me that this has been going on for 1-2 months and seems to be getting worse especially in regard to the toe. He has not had any specific x-rays to identify obvious signs of osteomyelitis up to this point that something would likely get a different today as well. With that being said he also has not had  any current arterial studies which I think is something else that we probably will look into performing at this point. He does have a history of diabetes mellitus type 2, and diabetic neuropathy. Currently he has been using normal shoes for ambulation though he does have some kind of external device so that he does not have to have actual still toed boots anymore at work which he states has helped nonetheless we is at work he does have to have something that protects his feet as such per policy. He tells me he is having to do a lot more walking right now compared  to normal but he can try to see if he can change something in that regard. 06/16/2020 upon evaluation today patient appears to be doing well all things considered with regard to his wounds. I do not feel like anything is it any worse and overall I feel like the Bactrim has done well. The culture that I reviewed did not reveal any specific organisms unfortunately that would help tailor [name]. I did actually review the x-ray as well it does appear that he has evidence of osteomyelitis of the distal tuft of the second toe left foot. That was discussed with him today. He likely will need a referral to infectious disease. 06/30/2020 on evaluation today patient appears to be doing well at this time in regard to his wounds all things considered. The erythema has improved he does have evidence of osteomyelitis again the second toe on the left foot but at the other sites there were no obvious signs. He does have wounds bilaterally on his feet and toes. These are to require sharp debridement today. He is still pending as far as his appointment with infectious disease. With that being said this is actually scheduled for 29 July which is this month. He also has an appointment on the 27th with vascular. Overall at least we are getting to the point where I think we are getting something is taken care of here. 07/12/2020 patient did have his appointment with  [facility] infectious disease. They have placed him on medications which include Flagyl 500 mg and Levaquin 750 mg for the next several [name] until September 9. Other than that he overall seems to be looking quite well and I am very pleased with how things are progressing at this point. 07/21/2020 on evaluation today patient appears to be doing well with regard to his wounds. He is on Flagyl as well as Levaquin which is helping to take care of the infection it appears. Fortunately there is no signs of active infection systemically at this time which is also good news. I am very pleased with where things stand. 07/28/20 on evaluation today patient appears to be doing well with regard to his wounds. He has been tolerating the dressing changes without complication we've been using the alginate currently. With that being said I do feel like the patient is making good progress albeit slow. 08/04/2020 on evaluation today patient's wound actually is showing signs of good improvement which is great news and overall very pleased with where things stand. Especially in regard to the right foot. The left great toe unfortunately is still having a lot of drainage and is somewhat macerated. This actually does appear to go deeper and is actually showing signs of bone exposure in the base of the wound. Again he is on antibiotics and I think we could consider HBO therapy in fact this is something I may discuss with him next week depending on how the toe looks. With that being said in the short-term I think I am going to actually recommend that he change the dressing more frequently right has been doing every other day Objective Constitutional Well-nourished and well-hydrated in no acute distress. Vitals Time Taken: 3:15 AM, Height: 72 in, Weight: 249 lbs, BMI: 33.8, Temperature: 97.8 F, Pulse: 94 bpm, Respiratory Rate: 20 breaths/min, Blood Pressure: 116/77 mmHg. Respiratory Kulzer, [name] ([id number]) normal breathing  without difficulty. Psychiatric this patient is able to make decisions and demonstrates good insight into disease process. Alert and Oriented x 3. pleasant and cooperative. General Notes: Upon  inspection patient's wound bed actually showed signs of good granulation at this time. There does not appear to be significant slough buildup that I was not able to carefully clear away that there was some and I did perform debridement to both remove the slough as well as some callus and post debridement the wound beds appear to be doing better which is great news. Overall very pleased with where things stand. Integumentary (Hair, Skin) Wound #1 status is Open. Original cause of wound was Trauma. The wound is located on the Right,Plantar Foot. The wound measures 0.6cm length x 0.5cm width x 0.5cm depth; 0.236cm^2 area and 0.118cm^3 volume. There is Fat Layer (Subcutaneous Tissue) exposed. There is a medium amount of serosanguineous drainage noted. The wound margin is flat and intact. There is large (67-100%) pink granulation within the wound bed. There is no necrotic tissue within the wound bed. Wound #2 status is Open. Original cause of wound was Gradually Appeared. The wound is located on the [employer]. The wound measures 1cm length x 1.3cm width x 0.4cm depth; 1.021cm^2 area and 0.408cm^3 volume. There is Fat Layer (Subcutaneous Tissue) exposed. There is a medium amount of serous drainage noted. The wound margin is epibole. There is no granulation within the wound bed. There is a large (67-100%) amount of necrotic tissue within the wound bed including Adherent Slough. Assessment Active Problems ICD-10 Type 2 diabetes mellitus with foot ulcer Non-pressure chronic ulcer of other part of left foot with fat layer exposed Non-pressure chronic ulcer of other part of right foot with fat layer exposed Type 2 diabetes mellitus with diabetic neuropathy, unspecified Procedures Wound  #1 Pre-procedure diagnosis of Wound #1 is a Trauma, Other located on the Right,Plantar Foot .Severity of Tissue Pre Debridement is: Fat layer exposed. There was a Excisional Skin/Subcutaneous Tissue Debridement with a total area of 0.3 sq cm performed by [name], [name] Grabe E., PA-C. With the following instrument(s): Curette Material removed includes Callus, Subcutaneous Tissue, and Slough after achieving pain control using Lidocaine. A time out was conducted at 15:52, prior to the start of the procedure. A Moderate amount of bleeding was controlled with Pressure. The procedure was tolerated well with a pain level of 0 throughout and a pain level of 0 following the procedure. Post Debridement Measurements: 0.6cm length x 0.5cm width x 0.6cm depth; 0.141cm^3 volume. Character of Wound/Ulcer Post Debridement is improved. Severity of Tissue Post Debridement is: Fat layer exposed. Post procedure Diagnosis Wound #1: Same as Pre-Procedure Wound #2 Pre-procedure diagnosis of Wound #2 is a Diabetic Wound/Ulcer of the Lower Extremity located on the Left,Anterior Toe Great .Severity of Tissue Pre Debridement is: Fat layer exposed. There was a Excisional Skin/Subcutaneous Tissue Debridement with a total area of 1.3 sq cm performed by [name], Lance Galas E., PA-C. With the following instrument(s): Curette Material removed includes Callus and Subcutaneous Tissue and after achieving pain control using Lidocaine. A time out was conducted at 15:55, prior to the start of the procedure. A Minimum amount of bleeding was controlled with Pressure. The procedure was tolerated well with a pain level of 0 throughout and a pain level of 0 following the procedure. Post Debridement Measurements: 1cm length x 1.3cm width x 0.5cm depth; 0.511cm^3 volume. Character of Wound/Ulcer Post Debridement is improved. Severity of Tissue Post Debridement is: Bone involvement without necrosis. Post procedure Diagnosis Wound #2: Same as  Pre-Procedure Plan Wound Cleansing: Wound #1 Right,Plantar Foot: Clean wound with Normal Saline. Wound #2 Left,Anterior Toe Great: Clean  wound with Normal Saline. TOMPSON, Braxley ([id number]) Primary Wound Dressing: Wound #1 Right,Plantar Foot: Silver Alginate Wound #2 Left,Anterior Toe Great: Silver Alginate Secondary Dressing: Wound #1 Right,Plantar Foot: Gauze and Kerlix/Conform Wound #2 Left,Anterior Toe Great: Gauze and Kerlix/Conform Dressing Change Frequency: Wound #1 Right,Plantar Foot: Change Dressing Monday, Wednesday, Friday Wound #2 Left,Anterior Toe Great: Change dressing every day. - may changed twice daily if needed to keep dry Follow-up Appointments: Wound #1 Right,Plantar Foot: Return Appointment in 1 week. Wound #2 Left,Anterior Toe Great: Return Appointment in 1 week. Edema Control: Wound #1 Right,Plantar Foot: Elevate legs to the level of the heart and pump ankles as often as possible Wound #2 Left,Anterior Toe Great: Elevate legs to the level of the heart and pump ankles as often as possible Off-Loading: Wound #1 Right,Plantar Foot: Open toe surgical shoe Wound #2 Left,Anterior Toe Great: Open toe surgical shoe Additional Orders / Instructions: Wound #1 Right,Plantar Foot: Increase protein intake. Wound #2 Left,Anterior Toe Great: Increase protein intake. Medications-please add to medication list.: Wound #1 Right,Plantar Foot: P.O. Antibiotics - Continue Levaquin and Flagyl Wound #2 Left,Anterior Toe Great: P.O. Antibiotics - Continue Levaquin and Flagyl 1. I would recommend currently that we continue with the silver alginate dressing tucked into the wounds I do believe that with regard to the left great toe this should probably be changed at least daily if not twice a day to try to keep it from being so moist. That was discussed with the patient today and he is can do that over the next week. 2. I am also can recommend that we continue with the  open toe surgical shoe and he will also continue to wrap this roll gauze. 3. He is continuing the Levaquin and Flagyl as prescribed by infectious disease. We will see patient back for reevaluation in 1 week here in the clinic. If anything worsens or changes patient will contact our office for additional recommendations. Electronic Signature(s) Signed: 08/04/2020 5:07:04 PM By: Lenda Kelp PA-C Entered By: Lenda Kelp on 08/04/2020 17:07:04 Castrillo, Rozell ([id number]) -------------------------------------------------------------------------------- SuperBill Details Patient Name: [name] Lenz Date of Service: 08/04/2020 Medical Record Number: [id number] Patient Account Number: [id number] Date of Birth/Sex: 02/09/1961 (59 y.o. 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 E11.40 Type 2 diabetes mellitus with diabetic neuropathy, unspecified Facility Procedures CPT4 Code: 62563893 Description: 11042 - DEB SUBQ TISSUE 20 SQ CM/< Modifier: Quantity: 1 CPT4 Code: Description: ICD-10 Diagnosis Description L97.522 Non-pressure chronic ulcer of other part of left foot with fat layer exp L97.512 Non-pressure chronic ulcer of other part of right foot with fat layer ex Modifier: osed posed Quantity: Physician Procedures CPT4 Code: 7342876 Description: 11042 - WC PHYS SUBQ TISS 20 SQ CM Modifier: Quantity: 1 CPT4 Code: Description: ICD-10 Diagnosis Description L97.522 Non-pressure chronic ulcer of other part of left foot with fat layer exp L97.512 Non-pressure chronic ulcer of other part of  right foot with fat layer ex Modifier: osed posed Quantity: Electronic Signature(s) Signed: 08/04/2020 5:07:17 PM By: Lenda Kelp PA-C Entered By: Lenda Kelp on 08/04/2020 17:07:16

## 2020-08-04 NOTE — Progress Notes (Addendum)
JANN, RA ([id number]) Visit Report for 08/04/2020 Arrival Information Details Patient Name: [name], [name] Date of Service: 08/04/2020 3:15 PM Medical Record Number: [id number] Patient Account Number: [id number] Date of Birth/Sex: Nov 17, 1961 (59 y.o. M) Treating RN: Cornell Barman Primary Care Srihith Aquilino: Merrie Roof Other Clinician: Referring De Jaworski: Merrie Roof Treating Nalaysia Manganiello/Extender: Melburn Hake, [name] Weeks in Treatment: 8 Visit Information History Since Last Visit All ordered tests and consults were completed: No Patient Arrived: Ambulatory Added or deleted any medications: No Arrival Time: 15:45 Any new allergies or adverse reactions: No Accompanied By: self Had a fall or experienced change in No Transfer Assistance: None activities of daily living that may affect Patient Identification Verified: Yes risk of falls: Secondary Verification Process Completed: Yes Signs or symptoms of abuse/neglect since last visito No Patient Requires Transmission-Based Precautions: No Hospitalized since last visit: No Patient Has Alerts: Yes Implantable device outside of the clinic excluding No Patient Alerts: Type II Diabetic cellular tissue based products placed in the center since last visit: Has Dressing in Place as Prescribed: Yes Pain Present Now: No Electronic Signature(s) Signed: 08/04/2020 4:26:10 PM By: Darci Needle Entered By: Darci Needle on 08/04/2020 15:46:00 [name], [name] ([id number]) -------------------------------------------------------------------------------- [name] Date of Service: 08/04/2020 3:15 PM Medical Record Number: [id number] Patient Account Number: [id number] Date of Birth/Sex: 10-29-1961 (59 y.o. M) Treating RN: Grover Canavan Primary Care Lasheba Stevens: Merrie Roof Other Clinician: Referring Keiera Strathman: Merrie Roof Treating Ramel Tobon/Extender: Melburn Hake, [name] Weeks in Treatment:  8 Encounter Discharge Information Items Post Procedure Vitals Discharge Condition: Stable Temperature (F): 97.8 Ambulatory Status: Ambulatory Pulse (bpm): 94 Discharge Destination: Home Respiratory Rate (breaths/min): 20 Transportation: Private Auto Blood Pressure (mmHg): 116/77 Accompanied By: self Schedule Follow-up Appointment: Yes Clinical Summary of Care: Electronic Signature(s) Signed: 08/04/2020 4:54:16 PM By: Grover Canavan Entered By: Grover Canavan on 08/04/2020 16:04:42 [name], [name] ([id number]) -------------------------------------------------------------------------------- Lower Extremity Assessment Details Patient Name: [name] Date of Service: 08/04/2020 3:15 PM Medical Record Number: [id number] Patient Account Number: [id number] Date of Birth/Sex: [birth year] (59 y.o. M) Treating RN: Cornell Barman Primary Care Noemi Bellissimo: Merrie Roof Other Clinician: Referring Dondrea Clendenin: Merrie Roof Treating Keiandre Cygan/Extender: Melburn Hake, [name] Weeks in Treatment: 8 Edema Assessment Assessed: [Left: Yes] [Right: Yes] Edema: [Left: No] [Right: No] Calf Left: Right: Point of Measurement: 35 cm From Medial Instep 35 cm 35 cm Ankle Left: Right: Point of Measurement: 12 cm From Medial Instep 24 cm 22.5 cm Vascular Assessment Pulses: Dorsalis Pedis Palpable: [Left:Yes] [Right:Yes] Posterior Tibial Palpable: [Left:Yes] [Right:Yes] Electronic Signature(s) Signed: 08/04/2020 4:26:10 PM By: Darci Needle Signed: 08/04/2020 5:40:22 PM By: Gretta Cool, BSN, RN, CWS, Kim RN, BSN Entered By: Darci Needle on 08/04/2020 15:47:39 [name], [name] ([id number]) -------------------------------------------------------------------------------- Multi Wound Chart Details Patient Name: [name] Date of Service: 08/04/2020 3:15 PM Medical Record Number: [id number] Patient Account Number: [id number] Date of Birth/Sex: 03-Apr-1961 (59 y.o. M) Treating RN: Grover Canavan Primary Care  Jakoby Melendrez: Merrie Roof Other Clinician: Referring Rafia Shedden: Merrie Roof Treating Shalla Bulluck/Extender: Melburn Hake, [name] Weeks in Treatment: 8 Vital Signs Height(in): 72 Pulse(bpm): 94 Weight(lbs): 249 Blood Pressure(mmHg): 116/77 Body Mass Index(BMI): 34 Temperature(F): 97.8 Respiratory Rate(breaths/min): 20 Photos: [N/A:N/A] Wound Location: Right, Plantar Foot Left, Anterior Toe Great N/A Wounding Event: Trauma Gradually Appeared N/A Primary Etiology: Trauma, Other Diabetic Wound/Ulcer of the Lower N/A Extremity Secondary Etiology: Diabetic Wound/Ulcer of the Lower N/A N/A Extremity Comorbid History: Cataracts, Type II Diabetes, Cataracts, Type II Diabetes, N/A Neuropathy Neuropathy Date Acquired: 05/17/2020 05/07/2020 N/A Weeks of Treatment: 8 8 N/A Wound Status:  Open Open N/A Measurements L x W x D (cm) 0.6x0.5x0.5 1x1.3x0.4 N/A Area (cm) : 0.236 1.021 N/A Volume (cm) : 0.118 0.408 N/A % Reduction in Area: 0.00% 21.20% N/A % Reduction in Volume: -66.20% -57.50% N/A Classification: Full Thickness Without Exposed Grade 1 N/A Support Structures Exudate Amount: Medium Medium N/A Exudate Type: Serosanguineous Serous N/A Exudate Color: red, brown amber N/A Wound Margin: Flat and Intact Epibole N/A Granulation Amount: Large (67-100%) None Present (0%) N/A Granulation Quality: Pink N/A N/A Necrotic Amount: None Present (0%) Large (67-100%) N/A Exposed Structures: Fat Layer (Subcutaneous Tissue): Fat Layer (Subcutaneous Tissue): N/A Yes Yes Fascia: No Fascia: No Tendon: No Tendon: No Muscle: No Muscle: No Joint: No Joint: No Bone: No Bone: No Epithelialization: Small (1-33%) None N/A Treatment Notes Electronic Signature(s) Signed: 08/04/2020 4:54:16 PM By: Grover Canavan Entered By: Grover Canavan on 08/04/2020 15:51:02 [name], [name] ([id number]) [name], [name]  ([id number]) -------------------------------------------------------------------------------- Multi-Disciplinary Care Plan Details Patient Name: [name] Date of Service: 08/04/2020 3:15 PM Medical Record Number: [id number] Patient Account Number: [id number] Date of Birth/Sex: Apr 20, 1961 (59 y.o. M) Treating RN: Grover Canavan Primary Care Kevin Mario: Merrie Roof Other Clinician: Referring Ephraim Reichel: Merrie Roof Treating Hadlea Furuya/Extender: Melburn Hake, [name] Weeks in Treatment: 8 Active Inactive Osteomyelitis Nursing Diagnoses: Potential for infection: osteomyelitis Goals: Diagnostic evaluation for osteomyelitis completed as ordered Date Initiated: 06/07/2020 Date Inactivated: 07/12/2020 Target Resolution Date: 07/06/2020 Goal Status: Met Patient/caregiver will verbalize understanding of disease process and disease management Date Initiated: 06/07/2020 Date Inactivated: 07/12/2020 Target Resolution Date: 07/06/2020 Goal Status: Met Patient's osteomyelitis will resolve Date Initiated: 06/07/2020 Target Resolution Date: 08/18/2020 Goal Status: Active Signs and symptoms for osteomyelitis will be recognized and promptly addressed Date Initiated: 06/07/2020 Date Inactivated: 07/12/2020 Target Resolution Date: 07/06/2020 Goal Status: Met Interventions: Assess for signs and symptoms of osteomyelitis resolution every visit Provide education on osteomyelitis Notes: Wound/Skin Impairment Nursing Diagnoses: Impaired tissue integrity Goals: Patient/caregiver will verbalize understanding of skin care regimen Date Initiated: 06/07/2020 Date Inactivated: 07/12/2020 Target Resolution Date: 07/06/2020 Goal Status: Met Ulcer/skin breakdown will have a volume reduction of 30% by week 4 Date Initiated: 06/07/2020 Date Inactivated: 07/12/2020 Target Resolution Date: 07/06/2020 Goal Status: Met Ulcer/skin breakdown will have a volume reduction of 50% by week 8 Date Initiated: 07/28/2020 Target Resolution  Date: 08/06/2020 Goal Status: Active Interventions: Assess patient/caregiver ability to obtain necessary supplies Assess ulceration(s) every visit Treatment Activities: Referred to DME Mertha Clyatt for dressing supplies : 06/07/2020 Notes: Electronic Signature(s) Signed: 08/04/2020 4:54:16 PM By: Ellison Hughs, Ebb ([id number]) Entered By: Grover Canavan on 08/04/2020 15:50:46 [name], [name] ([id number]) -------------------------------------------------------------------------------- Pain Assessment Details Patient Name: [name] Date of Service: 08/04/2020 3:15 PM Medical Record Number: [id number] Patient Account Number: [id number] Date of Birth/Sex: 01-29-1961 (59 y.o. M) Treating RN: Cornell Barman Primary Care Carole Deere: Merrie Roof Other Clinician: Referring Saarah Dewing: Merrie Roof Treating Joci Dress/Extender: Melburn Hake, [name] Weeks in Treatment: 8 Active Problems Location of Pain Severity and Description of Pain Patient Has Paino No Site Locations With Dressing Change: No Pain Management and Medication Current Pain Management: Electronic Signature(s) Signed: 08/04/2020 4:26:10 PM By: Darci Needle Signed: 08/04/2020 5:40:22 PM By: Gretta Cool, BSN, RN, CWS, Kim RN, BSN Entered By: Darci Needle on 08/04/2020 15:46:39 [name], [name] ([id number]) -------------------------------------------------------------------------------- Patient/Caregiver Education Details Patient Name: [name] Date of Service: 08/04/2020 3:15 PM Medical Record Number: [id number] Patient Account Number: [id number] Date of Birth/Gender: [birth year] (59 y.o. M) Treating RN: Grover Canavan Primary Care Physician: Merrie Roof Other Clinician: Referring Physician: Merrie Roof Treating Physician/Extender: Melburn Hake, [name]  Weeks in Treatment: 8 Education Assessment Education Provided To: Patient Education Topics Provided Wound/Skin Impairment: Handouts: Caring for Your Ulcer Methods:  Explain/Verbal Responses: State content correctly Electronic Signature(s) Signed: 08/04/2020 4:54:16 PM By: Grover Canavan Entered By: Grover Canavan on 08/04/2020 16:03:10 [name], [name] ([id number]) -------------------------------------------------------------------------------- Wound Assessment Details Patient Name: [name] Date of Service: 08/04/2020 3:15 PM Medical Record Number: [id number] Patient Account Number: [id number] Date of Birth/Sex: 1961/05/12 (59 y.o. M) Treating RN: Cornell Barman Primary Care Dorene Bruni: Merrie Roof Other Clinician: Referring Diyana Starrett: Merrie Roof Treating Rayshell Goecke/Extender: Melburn Hake, [name] Weeks in Treatment: 8 Wound Status Wound Number: 1 Primary Etiology: Trauma, Other Wound Location: Right, Plantar Foot Secondary Etiology: Diabetic Wound/Ulcer of the Lower Extremity Wounding Event: Trauma Wound Status: Open Date Acquired: 05/17/2020 Comorbid History: Cataracts, Type II Diabetes, Neuropathy Weeks Of Treatment: 8 Clustered Wound: No Photos Wound Measurements Length: (cm) 0.6 Width: (cm) 0.5 Depth: (cm) 0.5 Area: (cm) 0.236 Volume: (cm) 0.118 % Reduction in Area: 0% % Reduction in Volume: -66.2% Epithelialization: Small (1-33%) Wound Description Classification: Full Thickness Without Exposed Support Structu Wound Margin: Flat and Intact Exudate Amount: Medium Exudate Type: Serosanguineous Exudate Color: red, brown res Foul Odor After Cleansing: No Slough/Fibrino No Wound Bed Granulation Amount: Large (67-100%) Exposed Structure Granulation Quality: Pink Fascia Exposed: No Necrotic Amount: None Present (0%) Fat Layer (Subcutaneous Tissue) Exposed: Yes Tendon Exposed: No Muscle Exposed: No Joint Exposed: No Bone Exposed: No Treatment Notes Wound #1 (Right, Plantar Foot) Notes scell, gauze, conform, tape Electronic Signature(s) Signed: 08/04/2020 4:26:10 PM By: Mauri Reading, [name] ([id number]) Signed:  08/04/2020 5:40:22 PM By: Gretta Cool, BSN, RN, CWS, Kim RN, BSN Entered By: Darci Needle on 08/04/2020 15:44:20 [name], [name] ([id number]) -------------------------------------------------------------------------------- Wound Assessment Details Patient Name: [name] Date of Service: 08/04/2020 3:15 PM Medical Record Number: [id number] Patient Account Number: [id number] Date of Birth/Sex: 08-07-1961 (59 y.o. M) Treating RN: Cornell Barman Primary Care Ziyonna Christner: Merrie Roof Other Clinician: Referring Berda Shelvin: Merrie Roof Treating Tarrah Furuta/Extender: Melburn Hake, [name] Weeks in Treatment: 8 Wound Status Wound Number: 2 Primary Etiology: Diabetic Wound/Ulcer of the Lower Extremity Wound Location: Left, Anterior Toe Great Wound Status: Open Wounding Event: Gradually Appeared Comorbid History: Cataracts, Type II Diabetes, Neuropathy Date Acquired: 05/07/2020 Weeks Of Treatment: 8 Clustered Wound: No Photos Wound Measurements Length: (cm) 1 Width: (cm) 1.3 Depth: (cm) 0.4 Area: (cm) 1.021 Volume: (cm) 0.408 % Reduction in Area: 21.2% % Reduction in Volume: -57.5% Epithelialization: None Wound Description Classification: Grade 1 F Wound Margin: Epibole S Exudate Amount: Medium Exudate Type: Serous Exudate Color: amber oul Odor After Cleansing: No lough/Fibrino Yes Wound Bed Granulation Amount: None Present (0%) Exposed Structure Necrotic Amount: Large (67-100%) Fascia Exposed: No Necrotic Quality: Adherent Slough Fat Layer (Subcutaneous Tissue) Exposed: Yes Tendon Exposed: No Muscle Exposed: No Joint Exposed: No Bone Exposed: No Treatment Notes Wound #2 (Left, Anterior Toe Great) Notes scell, gauze, conform, tape Electronic Signature(s) Signed: 08/04/2020 4:26:10 PM By: Mauri Reading, Davaris ([id number]) Signed: 08/04/2020 5:40:22 PM By: Gretta Cool, BSN, RN, CWS, Kim RN, BSN Entered By: Darci Needle on 08/04/2020 15:44:43 Wellons, Selvin  ([id number]) -------------------------------------------------------------------------------- Vitals Details Patient Name: [name] Date of Service: 08/04/2020 3:15 PM Medical Record Number: [id number] Patient Account Number: [id number] Date of Birth/Sex: 08-Apr-1961 (59 y.o. M) Treating RN: Cornell Barman Primary Care Maloni Musleh: Merrie Roof Other Clinician: Referring Bonetta Mostek: Merrie Roof Treating Zandon Talton/Extender: Melburn Hake, [name] Weeks in Treatment: 8 Vital Signs Time Taken: 03:15 Temperature (F): 97.8 Height (in): 72 Pulse (bpm): 94 Weight (lbs): 249 Respiratory Rate (  breaths/min): 20 Body Mass Index (BMI): 33.8 Blood Pressure (mmHg): 116/77 Reference Range: 80 - 120 mg / dl Electronic Signature(s) Signed: 08/04/2020 4:26:10 PM By: Darci Needle Entered By: Darci Needle on 08/04/2020 15:46:26

## 2020-08-16 ENCOUNTER — Encounter: Payer: BC Managed Care – PPO | Attending: Physician Assistant | Admitting: Physician Assistant

## 2020-08-16 ENCOUNTER — Other Ambulatory Visit: Payer: Self-pay

## 2020-08-16 DIAGNOSIS — E11621 Type 2 diabetes mellitus with foot ulcer: Secondary | ICD-10-CM | POA: Insufficient documentation

## 2020-08-16 DIAGNOSIS — E1140 Type 2 diabetes mellitus with diabetic neuropathy, unspecified: Secondary | ICD-10-CM | POA: Insufficient documentation

## 2020-08-16 DIAGNOSIS — L97512 Non-pressure chronic ulcer of other part of right foot with fat layer exposed: Secondary | ICD-10-CM | POA: Insufficient documentation

## 2020-08-16 DIAGNOSIS — L97522 Non-pressure chronic ulcer of other part of left foot with fat layer exposed: Secondary | ICD-10-CM | POA: Diagnosis not present

## 2020-08-16 NOTE — Progress Notes (Addendum)
[name] ([id number]) Visit Report for 08/16/2020 Chief Complaint Document Details Patient Name: [name], [name] Date of Service: 08/16/2020 3:30 PM Medical Record Number: [id number] Patient Account Number: [id number] Date of Birth/Sex: 07/13/1961 (59 y.o. M) Treating RN: Tyler Aas Primary Care Provider: Roosvelt Maser Other Clinician: Referring Provider: Roosvelt Maser Treating Provider/Extender: Linwood Dibbles, Emmanuel Ercole Weeks in Treatment: 10 Information Obtained from: Patient Chief Complaint Left foot/toe ulcers Electronic Signature(s) Signed: 08/16/2020 4:17:56 PM By: Lenda Kelp PA-C Entered By: Lenda Kelp on 08/16/2020 16:17:55 Belgrave, Edyn ([id number]) -------------------------------------------------------------------------------- Debridement Details Patient Name: [name] Date of Service: 08/16/2020 3:30 PM Medical Record Number: [id number] Patient Account Number: [id number] Date of Birth/Sex: 11/08/1961 (59 y.o. M) Treating RN: Tyler Aas Primary Care Provider: Roosvelt Maser Other Clinician: Referring Provider: Roosvelt Maser Treating Provider/Extender: Linwood Dibbles, Jihan Mellette Weeks in Treatment: 10 Debridement Performed for Wound #2 Left,Anterior Toe Great Assessment: Performed By: Physician [name], Dynasty Holquin E., PA-C Debridement Type: Debridement Severity of Tissue Pre Debridement: Fat layer exposed Level of Consciousness (Pre- Awake and Alert procedure): Pre-procedure Verification/Time Out Yes - 16:18 Taken: Start Time: 16:19 Pain Control: Lidocaine Total Area Debrided (L x W): 1.3 (cm) x 1.2 (cm) = 1.56 (cm) Tissue and other material Viable, Non-Viable, Callus, Slough, Subcutaneous, Slough debrided: Level: Skin/Subcutaneous Tissue Debridement Description: Excisional Instrument: Curette Bleeding: Minimum Hemostasis Achieved: Pressure End Time: 16:22 Procedural Pain: 0 Post Procedural Pain: 0 Response to Treatment: Procedure was tolerated well Level  of Consciousness (Post- Awake and Alert procedure): Post Debridement Measurements of Total Wound Length: (cm) 1.3 Width: (cm) 1.2 Depth: (cm) 0.6 Volume: (cm) 0.735 Character of Wound/Ulcer Post Debridement: Improved Severity of Tissue Post Debridement: Fat layer exposed Post Procedure Diagnosis Same as Pre-procedure Electronic Signature(s) Signed: 08/16/2020 4:51:15 PM By: Tyler Aas Signed: 08/16/2020 5:49:40 PM By: Lenda Kelp PA-C Entered By: Tyler Aas on 08/16/2020 16:24:23 Pless, Pearley ([id number]) -------------------------------------------------------------------------------- Debridement Details Patient Name: [name] Date of Service: 08/16/2020 3:30 PM Medical Record Number: [id number] Patient Account Number: [id number] Date of Birth/Sex: 1961/04/28 (59 y.o. M) Treating RN: Tyler Aas Primary Care Provider: Roosvelt Maser Other Clinician: Referring Provider: Roosvelt Maser Treating Provider/Extender: Linwood Dibbles, Jessice Madill Weeks in Treatment: 10 Debridement Performed for Wound #1 Right,Plantar Foot Assessment: Performed By: Physician [name], Zayana Salvador E., PA-C Debridement Type: Debridement Severity of Tissue Pre Debridement: Fat layer exposed Level of Consciousness (Pre- Awake and Alert procedure): Pre-procedure Verification/Time Out Yes - 16:23 Taken: Start Time: 16:24 Pain Control: Lidocaine Total Area Debrided (L x W): 0.5 (cm) x 0.5 (cm) = 0.25 (cm) Tissue and other material Viable, Non-Viable, Callus, Slough, Subcutaneous, Slough debrided: Level: Skin/Subcutaneous Tissue Debridement Description: Excisional Instrument: Curette Bleeding: Minimum Hemostasis Achieved: Pressure End Time: 16:25 Procedural Pain: 0 Post Procedural Pain: 0 Response to Treatment: Procedure was tolerated well Level of Consciousness (Post- Awake and Alert procedure): Post Debridement Measurements of Total Wound Length: (cm) 0.5 Width: (cm) 0.5 Depth: (cm)  0.7 Volume: (cm) 0.137 Character of Wound/Ulcer Post Debridement: Improved Severity of Tissue Post Debridement: Fat layer exposed Post Procedure Diagnosis Same as Pre-procedure Electronic Signature(s) Signed: 08/16/2020 4:51:15 PM By: Tyler Aas Signed: 08/16/2020 5:49:40 PM By: Lenda Kelp PA-C Entered By: Tyler Aas on 08/16/2020 16:26:14 Lindner, Hayven ([id number]) -------------------------------------------------------------------------------- HPI Details Patient Name: [name] Date of Service: 08/16/2020 3:30 PM Medical Record Number: [id number] Patient Account Number: [id number] Date of Birth/Sex: November 16, 1961 (59 y.o. M) Treating RN: Tyler Aas Primary Care Provider: Roosvelt Maser Other Clinician: Referring Provider: Roosvelt Maser Treating Provider/Extender: Linwood Dibbles, Micheale Schlack  Weeks in Treatment: 10 History of Present Illness HPI Description: 06/10/2020 upon evaluation today patient appears to be doing poorly in regard to his right plantar foot, left great toe, and left second toe. Unfortunately he has significant wounds at these locations but especially in regard to the second toe which does have a lot of necrotic tissue over the distal portion of the toe. The great toe does have some necrotic tissue as well. With that being said the patient tells me that this has been going on for 1-2 months and seems to be getting worse especially in regard to the toe. He has not had any specific x-rays to identify obvious signs of osteomyelitis up to this point that something would likely get a different today as well. With that being said he also has not had any current arterial studies which I think is something else that we probably will look into performing at this point. He does have a history of diabetes mellitus type 2, and diabetic neuropathy. Currently he has been using normal shoes for ambulation though he does have some kind of external device so that he does not have  to have actual still toed boots anymore at work which he states has helped nonetheless we is at work he does have to have something that protects his feet as such per policy. He tells me he is having to do a lot more walking right now compared to normal but he can try to see if he can change something in that regard. 06/16/2020 upon evaluation today patient appears to be doing well all things considered with regard to his wounds. I do not feel like anything is it any worse and overall I feel like the Bactrim has done well. The culture that I reviewed did not reveal any specific organisms unfortunately that would help tailor treatment. I did actually review the x-ray as well it does appear that he has evidence of osteomyelitis of the distal tuft of the second toe left foot. That was discussed with him today. He likely will need a referral to infectious disease. 06/30/2020 on evaluation today patient appears to be doing well at this time in regard to his wounds all things considered. The erythema has improved he does have evidence of osteomyelitis again the second toe on the left foot but at the other sites there were no obvious signs. He does have wounds bilaterally on his feet and toes. These are to require sharp debridement today. He is still pending as far as his appointment with infectious disease. With that being said this is actually scheduled for 29 July which is this month. He also has an appointment on the 27th with vascular. Overall at least we are getting to the point where I think we are getting something is taken care of here. 07/12/2020 patient did have his appointment with [facility] infectious disease. They have placed him on medications which include Flagyl 500 mg and Levaquin 750 mg for the next several weeks until September 9. Other than that he overall seems to be looking quite well and I am very pleased with how things are progressing at this point. 07/21/2020 on evaluation today patient  appears to be doing well with regard to his wounds. He is on Flagyl as well as Levaquin which is helping to take care of the infection it appears. Fortunately there is no signs of active infection systemically at this time which is also good news. I am very pleased with where things stand.  07/28/20 on evaluation today patient appears to be doing well with regard to his wounds. He has been tolerating the dressing changes without complication we've been using the alginate currently. With that being said I do feel like the patient is making good progress albeit slow. 08/04/2020 on evaluation today patient's wound actually is showing signs of good improvement which is great news and overall very pleased with where things stand. Especially in regard to the right foot. The left great toe unfortunately is still having a lot of drainage and is somewhat macerated. This actually does appear to go deeper and is actually showing signs of bone exposure in the base of the wound. Again he is on antibiotics and I think we could consider HBO therapy in fact this is something I may discuss with him next week depending on how the toe looks. With that being said in the short-term I think I am going to actually recommend that he change the dressing more frequently right has been doing every other day 08/16/2020 on evaluation today patient appears to be doing well with regard to his ulcers. He is going require some debridement of both sites remaining he still is having a lot of drainage from the toe but again this seems to be making some progress here just very slowly. I did discuss with him the possibility of hyperbarics today but he really was not interested in that at this point. Electronic Signature(s) Signed: 08/16/2020 5:03:33 PM By: Lenda Kelp PA-C Entered By: Lenda Kelp on 08/16/2020 17:03:32 Grable, Ivor  ([id number]) -------------------------------------------------------------------------------- Physical Exam Details Patient Name: [name] Date of Service: 08/16/2020 3:30 PM Medical Record Number: [id number] Patient Account Number: [id number] Date of Birth/Sex: September 05, 1961 (59 y.o. M) Treating RN: Tyler Aas Primary Care Provider: Roosvelt Maser Other Clinician: Referring Provider: Roosvelt Maser Treating Provider/Extender: Linwood Dibbles, Kathleen Tamm Weeks in Treatment: 10 Constitutional Well-nourished and well-hydrated in no acute distress. Respiratory normal breathing without difficulty. Psychiatric this patient is able to make decisions and demonstrates good insight into disease process. Alert and Oriented x 3. pleasant and cooperative. Notes Upon inspection patient's wound bed actually showed signs of good granulation at this time. He appears to be doing well in my opinion. With that being said I do believe that the patient is making very slow progress unfortunately with regard to healing. Electronic Signature(s) Signed: 08/16/2020 5:04:06 PM By: Lenda Kelp PA-C Entered By: Lenda Kelp on 08/16/2020 17:04:05 Nichter, Czar ([id number]) -------------------------------------------------------------------------------- Physician Orders Details Patient Name: [name] Date of Service: 08/16/2020 3:30 PM Medical Record Number: [id number] Patient Account Number: [id number] Date of Birth/Sex: 1961/03/13 (59 y.o. M) Treating RN: Tyler Aas Primary Care Provider: Roosvelt Maser Other Clinician: Referring Provider: Roosvelt Maser Treating Provider/Extender: Linwood Dibbles, Destiney Sanabia Weeks in Treatment: 10 Verbal / Phone Orders: No Diagnosis Coding ICD-10 Coding Code Description E11.621 Type 2 diabetes mellitus with foot ulcer 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.512 Non-pressure chronic ulcer of other part of right foot with fat layer  exposed E11.40 Type 2 diabetes mellitus with diabetic neuropathy, unspecified Wound Cleansing Wound #1 Right,Plantar Foot o Clean wound with Normal Saline. Wound #2 Left,Anterior Toe Great o Clean wound with Normal Saline. Primary Wound Dressing Wound #1 Right,Plantar Foot o Silver Alginate Wound #2 Left,Anterior Toe Great o Silver Alginate Secondary Dressing Wound #1 Right,Plantar Foot o Gauze and Kerlix/Conform Wound #2 Left,Anterior Toe Great o Gauze and Kerlix/Conform Dressing Change Frequency Wound #1 Right,Plantar Foot   o Change Dressing Monday, Wednesday, Friday Wound #2 Left,Anterior Toe Great o Change dressing every day. - may changed twice daily if needed to keep dry Follow-up Appointments Wound #1 Right,Plantar Foot o Return Appointment in 1 week. Wound #2 Left,Anterior Toe Great o Return Appointment in 1 week. Edema Control Wound #1 Right,Plantar Foot o Elevate legs to the level of the heart and pump ankles as often as possible Wound #2 Left,Anterior Toe Great o Elevate legs to the level of the heart and pump ankles as often as possible Off-Loading Wound #1 Right,Plantar Foot o Open toe surgical shoe Donavan, Demarie ([id number]) Wound #2 Left,Anterior Toe Great o Open toe surgical shoe Additional Orders / Instructions Wound #1 Right,Plantar Foot o Increase protein intake. Wound #2 Left,Anterior Toe Great o Increase protein intake. Medications-please add to medication list. Wound #1 Right,Plantar Foot o P.O. Antibiotics - Continue Levaquin and Flagyl Wound #2 Left,Anterior Toe Great o P.O. Antibiotics - Continue Levaquin and Flagyl Electronic Signature(s) Signed: 08/16/2020 4:51:15 PM By: Tyler Aas Signed: 08/16/2020 5:49:40 PM By: Lenda Kelp PA-C Entered By: Tyler Aas on 08/16/2020 16:29:52 Singleterry, Eddi  ([id number]) -------------------------------------------------------------------------------- Problem List Details Patient Name: [name] Date of Service: 08/16/2020 3:30 PM Medical Record Number: [id number] Patient Account Number: [id number] Date of Birth/Sex: June 25, 1961 (59 y.o. M) Treating RN: Tyler Aas Primary Care Provider: Roosvelt Maser Other Clinician: Referring Provider: Roosvelt Maser Treating Provider/Extender: Linwood Dibbles, Adasyn Mcadams Weeks in Treatment: 10 Active Problems ICD-10 Encounter Code Description Active Date MDM Diagnosis E11.621 Type 2 diabetes mellitus with foot ulcer 06/07/2020 No Yes L97.522 Non-pressure chronic ulcer of other part of left foot with fat layer 06/07/2020 No Yes exposed L97.512 Non-pressure chronic ulcer of other part of right foot with fat layer 06/07/2020 No Yes exposed E11.40 Type 2 diabetes mellitus with diabetic neuropathy, unspecified 06/07/2020 No Yes Inactive Problems Resolved Problems Electronic Signature(s) Signed: 08/16/2020 4:17:42 PM By: Lenda Kelp PA-C Entered By: Lenda Kelp on 08/16/2020 16:17:42 Gatchell, Louis ([id number]) -------------------------------------------------------------------------------- Progress Note Details Patient Name: [name] Date of Service: 08/16/2020 3:30 PM Medical Record Number: [id number] Patient Account Number: [id number] Date of Birth/Sex: 04-16-1961 (59 y.o. M) Treating RN: Tyler Aas Primary Care Provider: Roosvelt Maser Other Clinician: Referring Provider: Roosvelt Maser Treating Provider/Extender: Linwood Dibbles, Aliyanah Rozas Weeks in Treatment: 10 Subjective Chief Complaint Information obtained from Patient Left foot/toe ulcers History of Present Illness (HPI) 06/10/2020 upon evaluation today patient appears to be doing poorly in regard to his right plantar foot, left great toe, and left second toe. Unfortunately he has significant wounds at these locations but especially in regard to the  second toe which does have a lot of necrotic tissue over the distal portion of the toe. The great toe does have some necrotic tissue as well. With that being said the patient tells me that this has been going on for 1-2 months and seems to be getting worse especially in regard to the toe. He has not had any specific x-rays to identify obvious signs of osteomyelitis up to this point that something would likely get a different today as well. With that being said he also has not had any current arterial studies which I think is something else that we probably will look into performing at this point. He does have a history of diabetes mellitus type 2, and diabetic neuropathy. Currently he has been using normal shoes for ambulation though he does have some kind of external device so that he does not have to have actual  still toed boots anymore at work which he states has helped nonetheless we is at work he does have to have something that protects his feet as such per policy. He tells me he is having to do a lot more walking right now compared to normal but he can try to see if he can change something in that regard. 06/16/2020 upon evaluation today patient appears to be doing well all things considered with regard to his wounds. I do not feel like anything is it any worse and overall I feel like the Bactrim has done well. The culture that I reviewed did not reveal any specific organisms unfortunately that would help tailor treatment. I did actually review the x-ray as well it does appear that he has evidence of osteomyelitis of the distal tuft of the second toe left foot. That was discussed with him today. He likely will need a referral to infectious disease. 06/30/2020 on evaluation today patient appears to be doing well at this time in regard to his wounds all things considered. The erythema has improved he does have evidence of osteomyelitis again the second toe on the left foot but at the other sites  there were no obvious signs. He does have wounds bilaterally on his feet and toes. These are to require sharp debridement today. He is still pending as far as his appointment with infectious disease. With that being said this is actually scheduled for 29 July which is this month. He also has an appointment on the 27th with vascular. Overall at least we are getting to the point where I think we are getting something is taken care of here. 07/12/2020 patient did have his appointment with [facility] infectious disease. They have placed him on medications which include Flagyl 500 mg and Levaquin 750 mg for the next several weeks until September 9. Other than that he overall seems to be looking quite well and I am very pleased with how things are progressing at this point. 07/21/2020 on evaluation today patient appears to be doing well with regard to his wounds. He is on Flagyl as well as Levaquin which is helping to take care of the infection it appears. Fortunately there is no signs of active infection systemically at this time which is also good news. I am very pleased with where things stand. 07/28/20 on evaluation today patient appears to be doing well with regard to his wounds. He has been tolerating the dressing changes without complication we've been using the alginate currently. With that being said I do feel like the patient is making good progress albeit slow. 08/04/2020 on evaluation today patient's wound actually is showing signs of good improvement which is great news and overall very pleased with where things stand. Especially in regard to the right foot. The left great toe unfortunately is still having a lot of drainage and is somewhat macerated. This actually does appear to go deeper and is actually showing signs of bone exposure in the base of the wound. Again he is on antibiotics and I think we could consider HBO therapy in fact this is something I may discuss with him next week depending on how the  toe looks. With that being said in the short-term I think I am going to actually recommend that he change the dressing more frequently right has been doing every other day 08/16/2020 on evaluation today patient appears to be doing well with regard to his ulcers. He is going require some debridement of both sites remaining  he still is having a lot of drainage from the toe but again this seems to be making some progress here just very slowly. I did discuss with him the possibility of hyperbarics today but he really was not interested in that at this point. Objective Constitutional Well-nourished and well-hydrated in no acute distress. Vitals Time Taken: 3:50 AM, Height: 72 in, Weight: 249 lbs, BMI: 33.8, Temperature: 98.3 F, Pulse: 93 bpm, Respiratory Rate: 18 breaths/min, Keisling, Malacai ([id number]) Blood Pressure: 140/70 mmHg. Respiratory normal breathing without difficulty. Psychiatric this patient is able to make decisions and demonstrates good insight into disease process. Alert and Oriented x 3. pleasant and cooperative. General Notes: Upon inspection patient's wound bed actually showed signs of good granulation at this time. He appears to be doing well in my opinion. With that being said I do believe that the patient is making very slow progress unfortunately with regard to healing. Integumentary (Hair, Skin) Wound #1 status is Open. Original cause of wound was Trauma. The wound is located on the Right,Plantar Foot. The wound measures 0.5cm length x 0.6cm width x 0.6cm depth; 0.236cm^2 area and 0.141cm^3 volume. There is Fat Layer (Subcutaneous Tissue) exposed. There is a medium amount of serosanguineous drainage noted. The wound margin is flat and intact. There is large (67-100%) pink granulation within the wound bed. There is no necrotic tissue within the wound bed. Wound #2 status is Open. Original cause of wound was Gradually Appeared. The wound is located on the OmnicareLeft,Anterior Toe  Great. The wound measures 1.3cm length x 1.2cm width x 0.5cm depth; 1.225cm^2 area and 0.613cm^3 volume. There is Fat Layer (Subcutaneous Tissue) exposed. There is a medium amount of serous drainage noted. The wound margin is epibole. There is no granulation within the wound bed. There is a large (67-100%) amount of necrotic tissue within the wound bed including Adherent Slough. Assessment Active Problems ICD-10 Type 2 diabetes mellitus with foot ulcer Non-pressure chronic ulcer of other part of left foot with fat layer exposed Non-pressure chronic ulcer of other part of right foot with fat layer exposed Type 2 diabetes mellitus with diabetic neuropathy, unspecified Procedures Wound #1 Pre-procedure diagnosis of Wound #1 is a Trauma, Other located on the Right,Plantar Foot .Severity of Tissue Pre Debridement is: Fat layer exposed. There was a Excisional Skin/Subcutaneous Tissue Debridement with a total area of 0.25 sq cm performed by [name], Onda Kattner E., PA-C. With the following instrument(s): Curette to remove Viable and Non-Viable tissue/material. Material removed includes Callus, Subcutaneous Tissue, and Slough after achieving pain control using Lidocaine. No specimens were taken. A time out was conducted at 16:23, prior to the start of the procedure. A Minimum amount of bleeding was controlled with Pressure. The procedure was tolerated well with a pain level of 0 throughout and a pain level of 0 following the procedure. Post Debridement Measurements: 0.5cm length x 0.5cm width x 0.7cm depth; 0.137cm^3 volume. Character of Wound/Ulcer Post Debridement is improved. Severity of Tissue Post Debridement is: Fat layer exposed. Post procedure Diagnosis Wound #1: Same as Pre-Procedure Wound #2 Pre-procedure diagnosis of Wound #2 is a Diabetic Wound/Ulcer of the Lower Extremity located on the Left,Anterior Toe Great .Severity of Tissue Pre Debridement is: Fat layer exposed. There was a Excisional  Skin/Subcutaneous Tissue Debridement with a total area of 1.56 sq cm performed by [name], Quandre Polinski E., PA-C. With the following instrument(s): Curette to remove Viable and Non-Viable tissue/material. Material removed includes Callus, Subcutaneous Tissue, and Slough after achieving pain control using  Lidocaine. No specimens were taken. A time out was conducted at 16:18, prior to the start of the procedure. A Minimum amount of bleeding was controlled with Pressure. The procedure was tolerated well with a pain level of 0 throughout and a pain level of 0 following the procedure. Post Debridement Measurements: 1.3cm length x 1.2cm width x 0.6cm depth; 0.735cm^3 volume. Character of Wound/Ulcer Post Debridement is improved. Severity of Tissue Post Debridement is: Fat layer exposed. Post procedure Diagnosis Wound #2: Same as Pre-Procedure Plan Wound Cleansing: Wound #1 Right,Plantar Foot: Billard, Danthony ([id number]) Clean wound with Normal Saline. Wound #2 Left,Anterior Toe Great: Clean wound with Normal Saline. Primary Wound Dressing: Wound #1 Right,Plantar Foot: Silver Alginate Wound #2 Left,Anterior Toe Great: Silver Alginate Secondary Dressing: Wound #1 Right,Plantar Foot: Gauze and Kerlix/Conform Wound #2 Left,Anterior Toe Great: Gauze and Kerlix/Conform Dressing Change Frequency: Wound #1 Right,Plantar Foot: Change Dressing Monday, Wednesday, Friday Wound #2 Left,Anterior Toe Great: Change dressing every day. - may changed twice daily if needed to keep dry Follow-up Appointments: Wound #1 Right,Plantar Foot: Return Appointment in 1 week. Wound #2 Left,Anterior Toe Great: Return Appointment in 1 week. Edema Control: Wound #1 Right,Plantar Foot: Elevate legs to the level of the heart and pump ankles as often as possible Wound #2 Left,Anterior Toe Great: Elevate legs to the level of the heart and pump ankles as often as possible Off-Loading: Wound #1 Right,Plantar Foot: Open toe  surgical shoe Wound #2 Left,Anterior Toe Great: Open toe surgical shoe Additional Orders / Instructions: Wound #1 Right,Plantar Foot: Increase protein intake. Wound #2 Left,Anterior Toe Great: Increase protein intake. Medications-please add to medication list.: Wound #1 Right,Plantar Foot: P.O. Antibiotics - Continue Levaquin and Flagyl Wound #2 Left,Anterior Toe Great: P.O. Antibiotics - Continue Levaquin and Flagyl 1. I would recommend at this time that we go and continue with the wound care measures as before specifically with regard to the silver alginate dressing I think that still the best. 2. I am also can recommend that we continue with dry gauze and roll gauze to cover. 3. I would recommend as much as possible an open toe surgical shoe although as I described to the patient this is something that he obviously cannot wear at work it would be great if he could but nonetheless that means he needs to be very diligent about trying to keep things from being too moist in his shoes. We will see patient back for reevaluation in 1 week here in the clinic. If anything worsens or changes patient will contact our office for additional recommendations. Electronic Signature(s) Signed: 08/16/2020 5:05:11 PM By: Lenda Kelp PA-C Entered By: Lenda Kelp on 08/16/2020 17:05:10 Decola, Raedyn ([id number]) -------------------------------------------------------------------------------- SuperBill Details Patient Name: [name] Date of Service: 08/16/2020 Medical Record Number: [id number] Patient Account Number: [id number] Date of Birth/Sex: 29-Jun-1961 (59 y.o. 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 E11.40 Type 2 diabetes mellitus with diabetic neuropathy, unspecified Facility Procedures CPT4 Code: 18841660 Description: 11042 - DEB SUBQ TISSUE 20 SQ CM/< Modifier: Quantity: 1 CPT4 Code: Description: ICD-10 Diagnosis Description L97.522 Non-pressure chronic ulcer of other part of left foot with fat layer exp  L97.512 Non-pressure chronic ulcer of other part of right foot with fat layer ex Modifier: osed posed Quantity: Physician Procedures CPT4 Code: 1610960 Description: 11042 - WC PHYS SUBQ TISS 20 SQ CM Modifier: Quantity: 1 CPT4 Code: Description: ICD-10 Diagnosis Description L97.522 Non-pressure chronic ulcer of other part of left foot with fat layer exp L97.512 Non-pressure chronic ulcer of other part of right foot with fat layer ex Modifier: osed posed Quantity: Electronic Signature(s) Signed: 08/16/2020 5:05:27 PM By: Lenda Kelp PA-C Entered By: Lenda Kelp on 08/16/2020 17:05:26

## 2020-08-17 NOTE — Progress Notes (Signed)
DARIO, [name] ([id number]) Visit Report for 08/16/2020 Arrival Information Details Patient Name: [name], [name] Date of Service: 08/16/2020 3:30 PM Medical Record Number: [id number] Patient Account Number: [id number] Date of Birth/Sex: May 13, 1961 (58 y.o. M) Treating RN: Grover Canavan Primary Care Alyona Romack: Merrie Roof Other Clinician: Referring Sury Wentworth: Merrie Roof Treating Kym Scannell/Extender: Melburn Hake, HOYT Weeks in Treatment: 10 Visit Information History Since Last Visit All ordered tests and consults were completed: No Patient Arrived: Ambulatory Added or deleted any medications: No Arrival Time: 15:54 Any new allergies or adverse reactions: No Accompanied By: self Had a fall or experienced change in No Transfer Assistance: None activities of daily living that may affect Patient Identification Verified: Yes risk of falls: Secondary Verification Process Completed: Yes Signs or symptoms of abuse/neglect since last visito No Patient Requires Transmission-Based Precautions: No Hospitalized since last visit: No Patient Has Alerts: Yes Implantable device outside of the clinic excluding No Patient Alerts: Type II Diabetic cellular tissue based products placed in the center since last visit: Pain Present Now: Yes Electronic Signature(s) Signed: 08/17/2020 11:06:52 AM By: Darci Needle Entered By: Darci Needle on 08/16/2020 15:57:01 Bales, Temiloluwa ([id number]) -------------------------------------------------------------------------------- [name] Date of Service: 08/16/2020 3:30 PM Medical Record Number: [id number] Patient Account Number: [id number] Date of Birth/Sex: January 14, 1961 (59 y.o. M) Treating RN: Grover Canavan Primary Care Sukhman Martine: Merrie Roof Other Clinician: Referring Duvan Mousel: Merrie Roof Treating Joanne Brander/Extender: Melburn Hake, HOYT Weeks in Treatment: 10 Encounter Discharge Information Items  Post Procedure Vitals Discharge Condition: Stable Temperature (F): 98.3 Ambulatory Status: Ambulatory Pulse (bpm): 93 Discharge Destination: Home Respiratory Rate (breaths/min): 18 Transportation: Private Auto Blood Pressure (mmHg): 140/70 Accompanied By: self Schedule Follow-up Appointment: Yes Clinical Summary of Care: Electronic Signature(s) Signed: 08/16/2020 4:51:15 PM By: Grover Canavan Entered By: Grover Canavan on 08/16/2020 16:31:52 Kartes, Christepher ([id number]) -------------------------------------------------------------------------------- Lower Extremity Assessment Details Patient Name: [name] Date of Service: 08/16/2020 3:30 PM Medical Record Number: [id number] Patient Account Number: [id number] Date of Birth/Sex: Aug 29, 1961 (59 y.o. M) Treating RN: Grover Canavan Primary Care Malayia Spizzirri: Merrie Roof Other Clinician: Referring Jinna Weinman: Merrie Roof Treating Adriell Polansky/Extender: Melburn Hake, HOYT Weeks in Treatment: 10 Edema Assessment Assessed: [Left: Yes] [Right: Yes] Edema: [Left: Yes] [Right: No] Calf Left: Right: Point of Measurement: 35 cm From Medial Instep 42.5 cm 42.3 cm Ankle Left: Right: Point of Measurement: 12 cm From Medial Instep 24.4 cm 23.6 cm Vascular Assessment Pulses: Dorsalis Pedis Palpable: [Left:Yes] [Right:Yes] Posterior Tibial Palpable: [Left:Yes] [Right:Yes] Electronic Signature(s) Signed: 08/16/2020 4:51:15 PM By: Grover Canavan Signed: 08/17/2020 11:06:52 AM By: Darci Needle Entered By: Darci Needle on 08/16/2020 16:08:16 Takeda, Phyllis ([id number]) -------------------------------------------------------------------------------- Multi Wound Chart Details Patient Name: [name] Date of Service: 08/16/2020 3:30 PM Medical Record Number: [id number] Patient Account Number: [id number] Date of Birth/Sex: [birth year] (59 y.o. M) Treating RN: Grover Canavan Primary Care Derel Mcglasson: Merrie Roof Other Clinician: Referring  Dajohn Ellender: Merrie Roof Treating Nolan Lasser/Extender: Melburn Hake, HOYT Weeks in Treatment: 10 Vital Signs Height(in): 72 Pulse(bpm): 93 Weight(lbs): 249 Blood Pressure(mmHg): 140/70 Body Mass Index(BMI): 34 Temperature(F): 98.3 Respiratory Rate(breaths/min): 18 Photos: [N/A:N/A] Wound Location: Right, Plantar Foot Left, Anterior Toe Great N/A Wounding Event: Trauma Gradually Appeared N/A Primary Etiology: Trauma, Other Diabetic Wound/Ulcer of the Lower N/A Extremity Secondary Etiology: Diabetic Wound/Ulcer of the Lower N/A N/A Extremity Comorbid History: Cataracts, Type II Diabetes, Cataracts, Type II Diabetes, N/A Neuropathy Neuropathy Date Acquired: 05/17/2020 05/07/2020 N/A Weeks of Treatment: 10 10 N/A Wound Status: Open Open N/A Measurements L x W x D (cm) 0.5x0.6x0.6 1.3x1.2x0.5  N/A Area (cm) : 0.236 1.225 N/A Volume (cm) : 0.141 0.613 N/A % Reduction in Area: 0.00% 5.50% N/A % Reduction in Volume: -98.60% -136.70% N/A Classification: Full Thickness Without Exposed Grade 1 N/A Support Structures Exudate Amount: Medium Medium N/A Exudate Type: Serosanguineous Serous N/A Exudate Color: red, brown amber N/A Wound Margin: Flat and Intact Epibole N/A Granulation Amount: Large (67-100%) None Present (0%) N/A Granulation Quality: Pink N/A N/A Necrotic Amount: None Present (0%) Large (67-100%) N/A Exposed Structures: Fat Layer (Subcutaneous Tissue): Fat Layer (Subcutaneous Tissue): N/A Yes Yes Fascia: No Fascia: No Tendon: No Tendon: No Muscle: No Muscle: No Joint: No Joint: No Bone: No Bone: No Epithelialization: Small (1-33%) None N/A Treatment Notes Electronic Signature(s) Signed: 08/16/2020 4:51:15 PM By: Grover Canavan Entered By: Grover Canavan on 08/16/2020 16:19:26 Rossie, Renaldo ([id number]) Poth, Aron ([id number]) -------------------------------------------------------------------------------- Multi-Disciplinary Care Plan Details Patient Name:  [name] Date of Service: 08/16/2020 3:30 PM Medical Record Number: [id number] Patient Account Number: [id number] Date of Birth/Sex: [birth year] (59 y.o. M) Treating RN: Grover Canavan Primary Care Tansy Lorek: Merrie Roof Other Clinician: Referring Gaylia Kassel: Merrie Roof Treating Reynold Mantell/Extender: Melburn Hake, HOYT Weeks in Treatment: 10 Active Inactive Osteomyelitis Nursing Diagnoses: Potential for infection: osteomyelitis Goals: Diagnostic evaluation for osteomyelitis completed as ordered Date Initiated: 06/07/2020 Date Inactivated: 07/12/2020 Target Resolution Date: 07/06/2020 Goal Status: Met Patient/caregiver will verbalize understanding of disease process and disease management Date Initiated: 06/07/2020 Date Inactivated: 07/12/2020 Target Resolution Date: 07/06/2020 Goal Status: Met Patient's osteomyelitis will resolve Date Initiated: 06/07/2020 Target Resolution Date: 08/18/2020 Goal Status: Active Signs and symptoms for osteomyelitis will be recognized and promptly addressed Date Initiated: 06/07/2020 Date Inactivated: 07/12/2020 Target Resolution Date: 07/06/2020 Goal Status: Met Interventions: Assess for signs and symptoms of osteomyelitis resolution every visit Provide education on osteomyelitis Notes: Wound/Skin Impairment Nursing Diagnoses: Impaired tissue integrity Goals: Patient/caregiver will verbalize understanding of skin care regimen Date Initiated: 06/07/2020 Date Inactivated: 07/12/2020 Target Resolution Date: 07/06/2020 Goal Status: Met Ulcer/skin breakdown will have a volume reduction of 30% by week 4 Date Initiated: 06/07/2020 Date Inactivated: 07/12/2020 Target Resolution Date: 07/06/2020 Goal Status: Met Ulcer/skin breakdown will have a volume reduction of 50% by week 8 Date Initiated: 07/28/2020 Target Resolution Date: 08/06/2020 Goal Status: Active Interventions: Assess patient/caregiver ability to obtain necessary supplies Assess ulceration(s) every  visit Treatment Activities: Referred to DME Loretta Doutt for dressing supplies : 06/07/2020 Notes: Electronic Signature(s) Signed: 08/16/2020 4:51:15 PM By: Ellison Hughs, Deionte ([id number]) Entered By: Grover Canavan on 08/16/2020 16:19:06 Pang, Angas ([id number]) -------------------------------------------------------------------------------- Pain Assessment Details Patient Name: [name] Date of Service: 08/16/2020 3:30 PM Medical Record Number: [id number] Patient Account Number: [id number] Date of Birth/Sex: 01-19-1961 (59 y.o. M) Treating RN: Grover Canavan Primary Care Permelia Bamba: Merrie Roof Other Clinician: Referring Shermar Friedland: Merrie Roof Treating Austyn Seier/Extender: Melburn Hake, HOYT Weeks in Treatment: 10 Active Problems Location of Pain Severity and Description of Pain Patient Has Paino Yes Site Locations Pain Location: Pain in Ulcers With Dressing Change: No Rate the pain. Current Pain Level: 2 Worst Pain Level: 6 Least Pain Level: 1 Tolerable Pain Level: 5 Character of Pain Describe the Pain: Aching, Throbbing Pain Management and Medication Current Pain Management: Electronic Signature(s) Signed: 08/16/2020 4:51:15 PM By: Grover Canavan Signed: 08/17/2020 11:06:52 AM By: Darci Needle Entered By: Darci Needle on 08/16/2020 15:56:49 Poust, Trayden ([id number]) -------------------------------------------------------------------------------- Patient/Caregiver Education Details Patient Name: [name] Date of Service: 08/16/2020 3:30 PM Medical Record Number: [id number] Patient Account Number: [id number] Date of Birth/Gender: 01-18-1961 (58 y.o. M) Treating RN: Grover Canavan Primary  Care Physician: Merrie Roof Other Clinician: Referring Physician: Merrie Roof Treating Physician/Extender: Sharalyn Ink in Treatment: 10 Education Assessment Education Provided To: Patient Education Topics Provided Wound/Skin  Impairment: Handouts: Caring for Your Ulcer Methods: Explain/Verbal Responses: State content correctly Electronic Signature(s) Signed: 08/16/2020 4:51:15 PM By: Grover Canavan Entered By: Grover Canavan on 08/16/2020 16:30:41 Savas, Landers ([id number]) -------------------------------------------------------------------------------- Wound Assessment Details Patient Name: [name] Date of Service: 08/16/2020 3:30 PM Medical Record Number: [id number] Patient Account Number: [id number] Date of Birth/Sex: [birth year] (59 y.o. M) Treating RN: Grover Canavan Primary Care Donovan Persley: Merrie Roof Other Clinician: Referring Visente Kirker: Merrie Roof Treating Omarius Grantham/Extender: Melburn Hake, HOYT Weeks in Treatment: 10 Wound Status Wound Number: 1 Primary Etiology: Trauma, Other Wound Location: Right, Plantar Foot Secondary Etiology: Diabetic Wound/Ulcer of the Lower Extremity Wounding Event: Trauma Wound Status: Open Date Acquired: 05/17/2020 Comorbid History: Cataracts, Type II Diabetes, Neuropathy Weeks Of Treatment: 10 Clustered Wound: No Photos Wound Measurements Length: (cm) 0.5 Width: (cm) 0.6 Depth: (cm) 0.6 Area: (cm) 0.236 Volume: (cm) 0.141 % Reduction in Area: 0% % Reduction in Volume: -98.6% Epithelialization: Small (1-33%) Wound Description Classification: Full Thickness Without Exposed Support Structu Wound Margin: Flat and Intact Exudate Amount: Medium Exudate Type: Serosanguineous Exudate Color: red, brown res Foul Odor After Cleansing: No Slough/Fibrino No Wound Bed Granulation Amount: Large (67-100%) Exposed Structure Granulation Quality: Pink Fascia Exposed: No Necrotic Amount: None Present (0%) Fat Layer (Subcutaneous Tissue) Exposed: Yes Tendon Exposed: No Muscle Exposed: No Joint Exposed: No Bone Exposed: No Treatment Notes Wound #1 (Right, Plantar Foot) Notes scell, gauze, conform, tape Electronic Signature(s) Signed: 08/16/2020 4:51:15 PM By:  Ellison Hughs, Emrick ([id number]) Signed: 08/17/2020 11:06:52 AM By: Darci Needle Entered By: Darci Needle on 08/16/2020 16:06:52 [place], [place] ([id number]) -------------------------------------------------------------------------------- Wound Assessment Details Patient Name: [name] Date of Service: 08/16/2020 3:30 PM Medical Record Number: [id number] Patient Account Number: [id number] Date of Birth/Sex: February 25, 1961 (59 y.o. M) Treating RN: Grover Canavan Primary Care Venus Ruhe: Merrie Roof Other Clinician: Referring Lucio Litsey: Merrie Roof Treating Shameria Trimarco/Extender: Melburn Hake, HOYT Weeks in Treatment: 10 Wound Status Wound Number: 2 Primary Etiology: Diabetic Wound/Ulcer of the Lower Extremity Wound Location: Left, Anterior Toe Great Wound Status: Open Wounding Event: Gradually Appeared Comorbid History: Cataracts, Type II Diabetes, Neuropathy Date Acquired: 05/07/2020 Weeks Of Treatment: 10 Clustered Wound: No Photos Wound Measurements Length: (cm) 1.3 Width: (cm) 1.2 Depth: (cm) 0.5 Area: (cm) 1.225 Volume: (cm) 0.613 % Reduction in Area: 5.5% % Reduction in Volume: -136.7% Epithelialization: None Wound Description Classification: Grade 1 Fo Wound Margin: Epibole Sl Exudate Amount: Medium Exudate Type: Serous Exudate Color: amber ul Odor After Cleansing: No ough/Fibrino Yes Wound Bed Granulation Amount: None Present (0%) Exposed Structure Necrotic Amount: Large (67-100%) Fascia Exposed: No Necrotic Quality: Adherent Slough Fat Layer (Subcutaneous Tissue) Exposed: Yes Tendon Exposed: No Muscle Exposed: No Joint Exposed: No Bone Exposed: No Treatment Notes Wound #2 (Left, Anterior Toe Great) Notes scell, gauze, conform, tape Electronic Signature(s) Signed: 08/16/2020 4:51:15 PM By: Ellison Hughs, Vicent ([id number]) Signed: 08/17/2020 11:06:52 AM By: Darci Needle Entered By: Darci Needle on 08/16/2020  16:07:11 Marchuk, Adley ([id number]) -------------------------------------------------------------------------------- Vitals Details Patient Name: [name] Date of Service: 08/16/2020 3:30 PM Medical Record Number: [id number] Patient Account Number: [id number] Date of Birth/Sex: 1961-09-21 (59 y.o. M) Treating RN: Grover Canavan Primary Care Juel Bellerose: Merrie Roof Other Clinician: Referring Dalylah Ramey: Merrie Roof Treating Ithzel Fedorchak/Extender: Melburn Hake, HOYT Weeks in Treatment: 10 Vital Signs Time Taken: 03:50 Temperature (F): 98.3 Height (in): 72 Pulse (bpm): 93  Weight (lbs): 249 Respiratory Rate (breaths/min): 18 Body Mass Index (BMI): 33.8 Blood Pressure (mmHg): 140/70 Reference Range: 80 - 120 mg / dl Electronic Signature(s) Signed: 08/17/2020 11:06:52 AM By: Darci Needle Entered By: Darci Needle on 08/16/2020 15:57:16

## 2020-08-24 ENCOUNTER — Ambulatory Visit: Payer: BC Managed Care – PPO | Admitting: Nurse Practitioner

## 2020-08-24 ENCOUNTER — Ambulatory Visit: Payer: BC Managed Care – PPO | Admitting: Family Medicine

## 2020-08-25 ENCOUNTER — Other Ambulatory Visit: Payer: Self-pay

## 2020-08-25 ENCOUNTER — Encounter: Payer: BC Managed Care – PPO | Admitting: Physician Assistant

## 2020-08-25 DIAGNOSIS — E1140 Type 2 diabetes mellitus with diabetic neuropathy, unspecified: Secondary | ICD-10-CM | POA: Diagnosis not present

## 2020-08-25 DIAGNOSIS — L97522 Non-pressure chronic ulcer of other part of left foot with fat layer exposed: Secondary | ICD-10-CM | POA: Diagnosis not present

## 2020-08-25 DIAGNOSIS — E11621 Type 2 diabetes mellitus with foot ulcer: Secondary | ICD-10-CM | POA: Diagnosis not present

## 2020-08-25 DIAGNOSIS — L97512 Non-pressure chronic ulcer of other part of right foot with fat layer exposed: Secondary | ICD-10-CM | POA: Diagnosis not present

## 2020-08-25 NOTE — Progress Notes (Addendum)
TAYVIEN, KANE ([id number]) Visit Report for 08/25/2020 Chief Complaint Document Details Patient Name: [name], [name] Date of Service: 08/25/2020 3:15 PM Medical Record Number: [id number] Patient Account Number: [id number] Date of Birth/Sex: 01-Mar-1961 (59 y.o. M) Treating RN: Huel Coventry Primary Care Provider: Roosvelt Maser Other Clinician: Referring Provider: Roosvelt Maser Treating Provider/Extender: Linwood Dibbles, Kehaulani Fruin Weeks in Treatment: 11 Information Obtained from: Patient Chief Complaint Left foot/toe ulcers Electronic Signature(s) Signed: 08/25/2020 3:28:33 PM By: Lenda Kelp PA-C Entered By: Lenda Kelp on 08/25/2020 15:28:33 [name], [name] ([id number]) -------------------------------------------------------------------------------- Debridement Details Patient Name: [name] Date of Service: 08/25/2020 3:15 PM Medical Record Number: [id number] Patient Account Number: [id number] Date of Birth/Sex: October 12, 1961 (59 y.o. M) Treating RN: Tyler Aas Primary Care Provider: Roosvelt Maser Other Clinician: Referring Provider: Roosvelt Maser Treating Provider/Extender: Linwood Dibbles, Nalina Yeatman Weeks in Treatment: 11 Debridement Performed for Wound #1 Right,Plantar Foot Assessment: Performed By: Physician [name], Jermari Tamargo E., PA-C Debridement Type: Debridement Severity of Tissue Pre Debridement: Fat layer exposed Level of Consciousness (Pre- Awake and Alert procedure): Pre-procedure Verification/Time Out Yes - 15:38 Taken: Start Time: 15:39 Pain Control: Lidocaine Total Area Debrided (L x W): 0.5 (cm) x 0.5 (cm) = 0.25 (cm) Tissue and other material Viable, Non-Viable, Callus, Slough, Subcutaneous, Slough debrided: Level: Skin/Subcutaneous Tissue Debridement Description: Excisional Instrument: Curette Bleeding: Minimum Hemostasis Achieved: Pressure End Time: 15:40 Procedural Pain: 0 Post Procedural Pain: 0 Response to Treatment: Procedure was tolerated well Level of  Consciousness (Post- Awake and Alert procedure): Post Debridement Measurements of Total Wound Length: (cm) 0.5 Width: (cm) 0.5 Depth: (cm) 0.7 Volume: (cm) 0.137 Character of Wound/Ulcer Post Debridement: Improved Severity of Tissue Post Debridement: Fat layer exposed Post Procedure Diagnosis Same as Pre-procedure Electronic Signature(s) Signed: 08/25/2020 4:34:46 PM By: Tyler Aas Signed: 08/25/2020 5:15:44 PM By: Lenda Kelp PA-C Entered By: Tyler Aas on 08/25/2020 15:41:53 [name], [name] ([id number]) -------------------------------------------------------------------------------- HPI Details Patient Name: [name] Date of Service: 08/25/2020 3:15 PM Medical Record Number: [id number] Patient Account Number: [id number] Date of Birth/Sex: [birth year] (59 y.o. M) Treating RN: Huel Coventry Primary Care Provider: Roosvelt Maser Other Clinician: Referring Provider: Roosvelt Maser Treating Provider/Extender: Linwood Dibbles, Robi Mitter Weeks in Treatment: 11 History of Present Illness HPI Description: 06/10/2020 upon evaluation today patient appears to be doing poorly in regard to his right plantar foot, left great toe, and left second toe. Unfortunately he has significant wounds at these locations but especially in regard to the second toe which does have a lot of necrotic tissue over the distal portion of the toe. The great toe does have some necrotic tissue as well. With that being said the patient tells me that this has been going on for 1-2 months and seems to be getting worse especially in regard to the toe. He has not had any specific x-rays to identify obvious signs of osteomyelitis up to this point that something would likely get a different today as well. With that being said he also has not had any current arterial studies which I think is something else that we probably will look into performing at this point. He does have a history of diabetes mellitus type 2, and diabetic  neuropathy. Currently he has been using normal shoes for ambulation though he does have some kind of external device so that he does not have to have actual still toed boots anymore at work which he states has helped nonetheless we is at work he does have to have something that protects his feet as such per policy.  He tells me he is having to do a lot more walking right now compared to normal but he can try to see if he can change something in that regard. 06/16/2020 upon evaluation today patient appears to be doing well all things considered with regard to his wounds. I do not feel like anything is it any worse and overall I feel like the Bactrim has done well. The culture that I reviewed did not reveal any specific organisms unfortunately that would help tailor treatment. I did actually review the x-ray as well it does appear that he has evidence of osteomyelitis of the distal tuft of the second toe left foot. That was discussed with him today. He likely will need a referral to infectious disease. 06/30/2020 on evaluation today patient appears to be doing well at this time in regard to his wounds all things considered. The erythema has improved he does have evidence of osteomyelitis again the second toe on the left foot but at the other sites there were no obvious signs. He does have wounds bilaterally on his feet and toes. These are to require sharp debridement today. He is still pending as far as his appointment with infectious disease. With that being said this is actually scheduled for 29 July which is this month. He also has an appointment on the 27th with vascular. Overall at least we are getting to the point where I think we are getting something is taken care of here. 07/12/2020 patient did have his appointment with [facility] infectious disease. They have placed him on medications which include Flagyl 500 mg and Levaquin 750 mg for the next several weeks until September 9. Other than that he overall  seems to be looking quite well and I am very pleased with how things are progressing at this point. 07/21/2020 on evaluation today patient appears to be doing well with regard to his wounds. He is on Flagyl as well as Levaquin which is helping to take care of the infection it appears. Fortunately there is no signs of active infection systemically at this time which is also good news. I am very pleased with where things stand. 07/28/20 on evaluation today patient appears to be doing well with regard to his wounds. He has been tolerating the dressing changes without complication we've been using the alginate currently. With that being said I do feel like the patient is making good progress albeit slow. 08/04/2020 on evaluation today patient's wound actually is showing signs of good improvement which is great news and overall very pleased with where things stand. Especially in regard to the right foot. The left great toe unfortunately is still having a lot of drainage and is somewhat macerated. This actually does appear to go deeper and is actually showing signs of bone exposure in the base of the wound. Again he is on antibiotics and I think we could consider HBO therapy in fact this is something I may discuss with him next week depending on how the toe looks. With that being said in the short-term I think I am going to actually recommend that he change the dressing more frequently right has been doing every other day 08/16/2020 on evaluation today patient appears to be doing well with regard to his ulcers. He is going require some debridement of both sites remaining he still is having a lot of drainage from the toe but again this seems to be making some progress here just very slowly. I did discuss with him the possibility  of hyperbarics today but he really was not interested in that at this point. 08/25/2020 upon evaluation today patient's wounds appear to be doing okay at this point. He is progressing  quite slowly at this time but nonetheless does seem to be making fairly good progress at this time which is good news. Fortunately there is no signs of active infection at this time. He does have an appointment with his infectious disease doctor next week on Thursday therefore he will not be able to make the appointment here we will likely see him in 2 weeks in that regard. Electronic Signature(s) Signed: 08/25/2020 4:57:39 PM By: Lenda Kelp PA-C Entered By: Lenda Kelp on 08/25/2020 16:57:38 [name], [name] ([id number]) -------------------------------------------------------------------------------- Physical Exam Details Patient Name: [name] Date of Service: 08/25/2020 3:15 PM Medical Record Number: [id number] Patient Account Number: [id number] Date of Birth/Sex: [birth year] (59 y.o. M) Treating RN: Huel Coventry Primary Care Provider: Roosvelt Maser Other Clinician: Referring Provider: Roosvelt Maser Treating Provider/Extender: Linwood Dibbles, Vicci Reder Weeks in Treatment: 11 Constitutional Well-nourished and well-hydrated in no acute distress. Respiratory normal breathing without difficulty. Psychiatric this patient is able to make decisions and demonstrates good insight into disease process. Alert and Oriented x 3. pleasant and cooperative. Notes Upon inspection patient's wound bed did require treatment on the right foot as far as debridement was concerned he tolerated the debridement today without complication post debridement the wound bed appears to be doing better though were still having quite a tough time getting this to heal as quickly and effectively is I had like to see. In regard to his toe on the left this appears to actually be better this week compared to previous. Electronic Signature(s) Signed: 08/25/2020 4:58:14 PM By: Lenda Kelp PA-C Entered By: Lenda Kelp on 08/25/2020 16:58:13 [name], [name]  ([id number]) -------------------------------------------------------------------------------- Physician Orders Details Patient Name: [name] Date of Service: 08/25/2020 3:15 PM Medical Record Number: [id number] Patient Account Number: [id number] Date of Birth/Sex: 06-15-1961 (59 y.o. 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 Wound #2 Left,Anterior Toe Great o Clean wound with Normal Saline. Primary Wound Dressing Wound #1 Right,Plantar Foot o Silver Alginate Wound #2 Left,Anterior Toe Great o Silver Alginate Secondary Dressing Wound #1 Right,Plantar Foot o Gauze and Kerlix/Conform Wound #2 Left,Anterior Toe Great o Gauze and Kerlix/Conform Dressing Change Frequency Wound #1 Right,Plantar Foot o Change Dressing Monday, Wednesday, Friday Wound #2 Left,Anterior Toe Great o Change dressing every day. - may change twice daily if needed to keep dry Follow-up Appointments Wound #1 Right,Plantar Foot o Return Appointment in 1 week. Wound #2 Left,Anterior Toe Great o Return Appointment in 1 week. Edema Control Wound #1 Right,Plantar Foot o Elevate legs to the level of the heart and pump ankles as often as possible Wound #2 Left,Anterior Toe Great o Elevate legs to the level of the heart and pump ankles as often as possible Off-Loading Wound #1 Right,Plantar Foot o Open  toe surgical shoe [name], [name] ([id number]) Wound #2 Left,Anterior Toe Great o  Open toe surgical shoe Additional Orders / Instructions Wound #1 Right,Plantar Foot o Increase protein intake. Wound #2 Left,Anterior Toe Great o Increase protein intake. Medications-please add to medication list. Wound #1 Right,Plantar Foot o P.O. Antibiotics - Continue Levaquin and Flagyl Wound #2 Left,Anterior Toe Great o P.O. Antibiotics - Continue Levaquin and Flagyl Electronic Signature(s) Signed: 08/25/2020 4:34:46 PM By: Tyler Aas Signed: 08/25/2020 5:15:44 PM By: Lenda Kelp PA-C Entered By: Tyler Aas on 08/25/2020 15:39:58 [name], [name] ([id number]) -------------------------------------------------------------------------------- Problem List Details Patient Name: [name] Date of Service: 08/25/2020 3:15 PM Medical Record Number: [id number] Patient Account Number: [id number] Date of Birth/Sex: Sep 13, 1961 (59 y.o. M) Treating RN: Huel Coventry Primary Care Provider: Roosvelt Maser Other Clinician: Referring Provider: Roosvelt Maser Treating Provider/Extender: Lenda Kelp Weeks in Treatment: 11 Active Problems ICD-10 Encounter Code Description Active Date MDM Diagnosis E11.621 Type 2 diabetes mellitus with foot ulcer 06/07/2020 No Yes L97.522 Non-pressure chronic ulcer of other part of left foot with fat layer 06/07/2020 No Yes exposed L97.512 Non-pressure chronic ulcer of other part of right foot with fat layer 06/07/2020 No Yes exposed E11.40 Type 2 diabetes mellitus with diabetic neuropathy, unspecified 06/07/2020 No Yes Inactive Problems Resolved Problems Electronic Signature(s) Signed: 08/25/2020 3:28:26 PM By: Lenda Kelp PA-C Entered By: Lenda Kelp on 08/25/2020 15:28:25 [name], [name] ([id number]) -------------------------------------------------------------------------------- Progress Note Details Patient Name: [name] Date of  Service: 08/25/2020 3:15 PM Medical Record Number: [id number] Patient Account Number: [id number] Date of Birth/Sex: [birth year] (59 y.o. M) Treating RN: Huel Coventry Primary Care Provider: Roosvelt Maser Other Clinician: Referring Provider: Roosvelt Maser Treating Provider/Extender: Linwood Dibbles, Alicyn Klann Weeks in Treatment: 11 Subjective Chief Complaint Information obtained from Patient Left foot/toe ulcers History of Present Illness (HPI) 06/10/2020 upon evaluation today patient appears to be doing poorly in regard to his right plantar foot, left great toe, and left second toe. Unfortunately he has significant wounds at these locations but especially in regard to the second toe which does have a lot of necrotic tissue over the distal portion of the toe. The great toe does have some necrotic tissue as well. With that being said the patient tells me that this has been going on for 1-2 months and seems to be getting worse especially in regard to the toe. He has not had any specific x-rays to identify obvious signs of osteomyelitis up to this point that something would likely get a different today as well. With that being said he also has not had any current arterial studies which I think is something else that we probably will look into performing at this point. He does have a history of diabetes mellitus type 2, and diabetic neuropathy. Currently he has been using normal shoes for ambulation though he does have some kind of external device so that he does not have to have actual still toed boots anymore at work which he states has helped nonetheless we is at work he does have to have something that protects his feet as such per policy. He tells me he is having to do a lot more walking right now compared to normal but he can try to see if he can change something in that regard. 06/16/2020 upon evaluation today patient appears to be doing well all things considered with regard to his wounds. I do not feel like  anything is it any worse and overall I feel like the Bactrim has done well. The culture that I reviewed did not reveal any specific organisms unfortunately  that would help tailor treatment. I did actually review the x-ray as well it does appear that he has evidence of osteomyelitis of the distal tuft of the second toe left foot. That was discussed with him today. He likely will need a referral to infectious disease. 06/30/2020 on evaluation today patient appears to be doing well at this time in regard to his wounds all things considered. The erythema has improved he does have evidence of osteomyelitis again the second toe on the left foot but at the other sites there were no obvious signs. He does have wounds bilaterally on his feet and toes. These are to require sharp debridement today. He is still pending as far as his appointment with infectious disease. With that being said this is actually scheduled for 29 July which is this month. He also has an appointment on the 27th with vascular. Overall at least we are getting to the point where I think we are getting something is taken care of here. 07/12/2020 patient did have his appointment with [facility] infectious disease. They have placed him on medications which include Flagyl 500 mg and Levaquin 750 mg for the next several weeks until September 9. Other than that he overall seems to be looking quite well and I am very pleased with how things are progressing at this point. 07/21/2020 on evaluation today patient appears to be doing well with regard to his wounds. He is on Flagyl as well as Levaquin which is helping to take care of the infection it appears. Fortunately there is no signs of active infection systemically at this time which is also good news. I am very pleased with where things stand. 07/28/20 on evaluation today patient appears to be doing well with regard to his wounds. He has been tolerating the dressing changes without complication we've been  using the alginate currently. With that being said I do feel like the patient is making good progress albeit slow. 08/04/2020 on evaluation today patient's wound actually is showing signs of good improvement which is great news and overall very pleased with where things stand. Especially in regard to the right foot. The left great toe unfortunately is still having a lot of drainage and is somewhat macerated. This actually does appear to go deeper and is actually showing signs of bone exposure in the base of the wound. Again he is on antibiotics and I think we could consider HBO therapy in fact this is something I may discuss with him next week depending on how the toe looks. With that being said in the short-term I think I am going to actually recommend that he change the dressing more frequently right has been doing every other day 08/16/2020 on evaluation today patient appears to be doing well with regard to his ulcers. He is going require some debridement of both sites remaining he still is having a lot of drainage from the toe but again this seems to be making some progress here just very slowly. I did discuss with him the possibility of hyperbarics today but he really was not interested in that at this point. 08/25/2020 upon evaluation today patient's wounds appear to be doing okay at this point. He is progressing quite slowly at this time but nonetheless does seem to be making fairly good progress at this time which is good news. Fortunately there is no signs of active infection at this time. He does have an appointment with his infectious disease doctor next week on Thursday therefore he will  not be able to make the appointment here we will likely see him in 2 weeks in that regard. Objective [name], [name] ([id number]) Constitutional Well-nourished and well-hydrated in no acute distress. Vitals Time Taken: 3:00 AM, Height: 72 in, Weight: 249 lbs, BMI: 33.8, Temperature: 98.0 F, Pulse: 80 bpm,  Respiratory Rate: 18 breaths/min, Blood Pressure: 146/68 mmHg. Respiratory normal breathing without difficulty. Psychiatric this patient is able to make decisions and demonstrates good insight into disease process. Alert and Oriented x 3. pleasant and cooperative. General Notes: Upon inspection patient's wound bed did require treatment on the right foot as far as debridement was concerned he tolerated the debridement today without complication post debridement the wound bed appears to be doing better though were still having quite a tough time getting this to heal as quickly and effectively is I had like to see. In regard to his toe on the left this appears to actually be better this week compared to previous. Integumentary (Hair, Skin) Wound #1 status is Open. Original cause of wound was Trauma. The wound is located on the Right,Plantar Foot. The wound measures 0.5cm length x 0.5cm width x 0.7cm depth; 0.196cm^2 area and 0.137cm^3 volume. There is Fat Layer (Subcutaneous Tissue) exposed. There is a medium amount of serosanguineous drainage noted. The wound margin is flat and intact. There is large (67-100%) pink granulation within the wound bed. There is no necrotic tissue within the wound bed. Wound #2 status is Open. Original cause of wound was Gradually Appeared. The wound is located on the [employer]. The wound measures 1.2cm length x 1.2cm width x 0.5cm depth; 1.131cm^2 area and 0.565cm^3 volume. There is Fat Layer (Subcutaneous Tissue) exposed. There is a medium amount of serous drainage noted. The wound margin is epibole. There is no granulation within the wound bed. There is a large (67-100%) amount of necrotic tissue within the wound bed including Adherent Slough. Assessment Active Problems ICD-10 Type 2 diabetes mellitus with foot ulcer Non-pressure chronic ulcer of other part of left foot with fat layer exposed Non-pressure chronic ulcer of other part of right foot  with fat layer exposed Type 2 diabetes mellitus with diabetic neuropathy, unspecified Procedures Wound #1 Pre-procedure diagnosis of Wound #1 is a Trauma, Other located on the Right,Plantar Foot .Severity of Tissue Pre Debridement is: Fat layer exposed. There was a Excisional Skin/Subcutaneous Tissue Debridement with a total area of 0.25 sq cm performed by [name], Chamberlain Steinborn E., PA-C. With the following instrument(s): Curette to remove Viable and Non-Viable tissue/material. Material removed includes Callus, Subcutaneous Tissue, and Slough after achieving pain control using Lidocaine. No specimens were taken. A time out was conducted at 15:38, prior to the start of the procedure. A Minimum amount of bleeding was controlled with Pressure. The procedure was tolerated well with a pain level of 0 throughout and a pain level of 0 following the procedure. Post Debridement Measurements: 0.5cm length x 0.5cm width x 0.7cm depth; 0.137cm^3 volume. Character of Wound/Ulcer Post Debridement is improved. Severity of Tissue Post Debridement is: Fat layer exposed. Post procedure Diagnosis Wound #1: Same as Pre-Procedure Plan Wound Cleansing: Wound #1 Right,Plantar Foot: Clean wound with Normal Saline. Wound #2 Left,Anterior Toe Great: Clean wound with Normal Saline. Primary Wound Dressing: Wound #1 Right,Plantar Foot: Delosreyes, Carrington ([id number]) Silver Alginate Wound #2 Left,Anterior Toe Great: Silver Alginate Secondary Dressing: Wound #1 Right,Plantar Foot: Gauze and Kerlix/Conform Wound #2 Left,Anterior Toe Great: Gauze and Kerlix/Conform Dressing Change Frequency: Wound #1 Right,Plantar Foot: Change Dressing Monday, Wednesday,  Friday Wound #2 Left,Anterior Toe Great: Change dressing every day. - may change twice daily if needed to keep dry Follow-up Appointments: Wound #1 Right,Plantar Foot: Return Appointment in 1 week. Wound #2 Left,Anterior Toe Great: Return Appointment in 1 week. Edema  Control: Wound #1 Right,Plantar Foot: Elevate legs to the level of the heart and pump ankles as often as possible Wound #2 Left,Anterior Toe Great: Elevate legs to the level of the heart and pump ankles as often as possible Off-Loading: Wound #1 Right,Plantar Foot: Open toe surgical shoe Wound #2 Left,Anterior Toe Great: Open toe surgical shoe Additional Orders / Instructions: Wound #1 Right,Plantar Foot: Increase protein intake. Wound #2 Left,Anterior Toe Great: Increase protein intake. Medications-please add to medication list.: Wound #1 Right,Plantar Foot: P.O. Antibiotics - Continue Levaquin and Flagyl Wound #2 Left,Anterior Toe Great: P.O. Antibiotics - Continue Levaquin and Flagyl 1. I would recommend currently we continue with the silver alginate dressing I think that still the best way to go. 2. I am also can recommend the patient continue with frequent dressing changes to keep the area clean and dry I do not think he needs to worry about infection worsening at this point although we will see what his infectious disease doctor has to say next week. Nonetheless I do believe he is getting need to make sure that this is as dry as possible to prevent anything from worsening in that regard. 3. Also can I suggest that he should try to avoid any excessive walking that he does not directly have to do. Already with his job he stands and walks pretty much all the time. I think this is part of what is keeping this from healing as effectively and quickly as we like to see. He really does not have the ability to come out of work. We will see patient back for reevaluation in 2 weeks here in the clinic. If anything worsens or changes patient will contact our office for additional recommendations. Electronic Signature(s) Signed: 08/25/2020 4:59:10 PM By: Lenda KelpStone III, Arine Foley PA-C Entered By: Lenda KelpStone III, Jolene Guyett on 08/25/2020 16:59:10 [name], [name]  ([id number]) -------------------------------------------------------------------------------- SuperBill Details Patient Name: [name] LenzNORRIS, [name] Date of Service: 08/25/2020 Medical Record Number: [id number] Patient Account Number: [id number] Date of Birth/Sex: 01/27/1961 (59 y.o. 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 E11.40 Type 2 diabetes mellitus with diabetic neuropathy, unspecified Facility Procedures CPT4 Code: [id number] Description: 99213 - WOUND CARE VISIT-LEV 3 EST PT Modifier: Quantity: 1 CPT4 Code: 3086578436100012 Description: 11042 - DEB SUBQ TISSUE 20 SQ CM/< Modifier: Quantity: 1 CPT4 Code: Description: ICD-10 Diagnosis Description L97.512 Non-pressure chronic ulcer of other part of right foot with fat layer ex Modifier: posed Quantity: Physician Procedures CPT4 Code: 6962952: [id number] Description: 11042 - WC PHYS SUBQ TISS 20 SQ CM Modifier: Quantity: 1 CPT4 Code: Description: ICD-10 Diagnosis Description L97.512 Non-pressure chronic ulcer of other part of right foot with fat layer ex Modifier: posed Quantity: Electronic Signature(s) Signed: 08/25/2020 5:07:43 PM By: Lenda KelpStone III, Koya Hunger PA-C Entered By: Lenda KelpStone III, Ollen Rao on 08/25/2020 17:07:42

## 2020-09-01 DIAGNOSIS — M869 Osteomyelitis, unspecified: Secondary | ICD-10-CM | POA: Diagnosis not present

## 2020-09-01 DIAGNOSIS — Z6833 Body mass index (BMI) 33.0-33.9, adult: Secondary | ICD-10-CM | POA: Diagnosis not present

## 2020-09-01 DIAGNOSIS — Z792 Long term (current) use of antibiotics: Secondary | ICD-10-CM | POA: Diagnosis not present

## 2020-09-09 ENCOUNTER — Other Ambulatory Visit: Payer: Self-pay

## 2020-09-09 ENCOUNTER — Encounter: Payer: BC Managed Care – PPO | Attending: Physician Assistant | Admitting: Physician Assistant

## 2020-09-09 DIAGNOSIS — E11621 Type 2 diabetes mellitus with foot ulcer: Secondary | ICD-10-CM | POA: Insufficient documentation

## 2020-09-09 DIAGNOSIS — E1140 Type 2 diabetes mellitus with diabetic neuropathy, unspecified: Secondary | ICD-10-CM | POA: Insufficient documentation

## 2020-09-09 DIAGNOSIS — L97522 Non-pressure chronic ulcer of other part of left foot with fat layer exposed: Secondary | ICD-10-CM | POA: Diagnosis not present

## 2020-09-09 DIAGNOSIS — L97512 Non-pressure chronic ulcer of other part of right foot with fat layer exposed: Secondary | ICD-10-CM | POA: Insufficient documentation

## 2020-09-09 NOTE — Progress Notes (Signed)
[name] ([id number]) Visit Report for 09/09/2020 Arrival Information Details Patient Name: [name] Date of Service: 09/09/2020 3:30 PM Medical Record Number: [id number] Patient Account Number: [id number] Date of Birth/Sex: Oct 08, 1961 (59 y.o. M) Treating RN: Darci Needle Primary Care Gerhardt Gleed: Merrie Roof Other Clinician: Referring Kairie Vangieson: Merrie Roof Treating Shylo Zamor/Extender: Melburn Hake, HOYT Weeks in Treatment: 13 Visit Information History Since Last Visit Added or deleted any medications: No Patient Arrived: Ambulatory Any new allergies or adverse reactions: No Arrival Time: 15:26 Had a fall or experienced change in No Accompanied By: self activities of daily living that may affect Transfer Assistance: None risk of falls: Patient Identification Verified: Yes Signs or symptoms of abuse/neglect since last visito No Secondary Verification Process Completed: Yes Hospitalized since last visit: No Patient Requires Transmission-Based Precautions: No Implantable device outside of the clinic excluding No Patient Has Alerts: Yes cellular tissue based products placed in the center Patient Alerts: Type II Diabetic since last visit: Has Dressing in Place as Prescribed: Yes Pain Present Now: No Electronic Signature(s) Signed: 09/09/2020 4:12:49 PM By: Darci Needle Previous Signature: 09/09/2020 3:28:48 PM Version By: Lorine Bears RCP, RRT, CHT Entered By: Darci Needle on 09/09/2020 15:35:07 [name] ([id number]) -------------------------------------------------------------------------------- [name] Date of Service: 09/09/2020 3:30 PM Medical Record Number: [id number] Patient Account Number: [id number] Date of Birth/Sex: [birth year] (59 y.o. M) Treating RN: Grover Canavan Primary Care Amena Dockham: Merrie Roof Other Clinician: Referring Masato Pettie: Merrie Roof Treating  Ezekiel Menzer/Extender: Melburn Hake, HOYT Weeks in Treatment: 13 Encounter Discharge Information Items Post Procedure Vitals Discharge Condition: Stable Temperature (F): 98.5 Ambulatory Status: Ambulatory Pulse (bpm): 87 Discharge Destination: Home Respiratory Rate (breaths/min): 18 Transportation: Private Auto Blood Pressure (mmHg): 149/85 Accompanied By: self Schedule Follow-up Appointment: Yes Clinical Summary of Care: Electronic Signature(s) Signed: 09/09/2020 4:08:44 PM By: Grover Canavan Entered By: Grover Canavan on 09/09/2020 15:45:56 [name] ([id number]) -------------------------------------------------------------------------------- Lower Extremity Assessment Details Patient Name: [name] Date of Service: 09/09/2020 3:30 PM Medical Record Number: [id number] Patient Account Number: [id number] Date of Birth/Sex: [birth year] (59 y.o. M) Treating RN: Darci Needle Primary Care Arena Lindahl: Merrie Roof Other Clinician: Referring Isami Mehra: Merrie Roof Treating Bentleigh Waren/Extender: Melburn Hake, HOYT Weeks in Treatment: 13 Edema Assessment Assessed: [Left: Yes] [Right: Yes] Edema: [Left: No] [Right: No] Calf Left: Right: Point of Measurement: 35 cm From Medial Instep 34 cm 36 cm Ankle Left: Right: Point of Measurement: 12 cm From Medial Instep 26 cm 24 cm Vascular Assessment Pulses: Dorsalis Pedis Palpable: [Left:Yes] [Right:Yes] Posterior Tibial Palpable: [Left:Yes] [Right:Yes] Electronic Signature(s) Signed: 09/09/2020 4:12:49 PM By: Darci Needle Entered By: Darci Needle on 09/09/2020 15:38:12 [name] ([id number]) -------------------------------------------------------------------------------- Multi Wound Chart Details Patient Name: [name] Date of Service: 09/09/2020 3:30 PM Medical Record Number: [id number] Patient Account Number: [id number] Date of Birth/Sex: 07-19-1961 (59 y.o. M) Treating RN: Grover Canavan Primary Care  Mayuri Staples: Merrie Roof Other Clinician: Referring Teige Rountree: Merrie Roof Treating Jaisean Monteforte/Extender: Melburn Hake, HOYT Weeks in Treatment: 13 Vital Signs Height(in): 72 Pulse(bpm): 87 Weight(lbs): 249 Blood Pressure(mmHg): 149/85 Body Mass Index(BMI): 34 Temperature(F): 98.5 Respiratory Rate(breaths/min): 18 Photos: [N/A:N/A] Wound Location: Right, Plantar Foot Left, Anterior Toe Great N/A Wounding Event: Trauma Gradually Appeared N/A Primary Etiology: Trauma, Other Diabetic Wound/Ulcer of the Lower N/A Extremity Secondary Etiology: Diabetic Wound/Ulcer of the Lower N/A N/A Extremity Comorbid History: Cataracts, Type II Diabetes, Cataracts, Type II Diabetes, N/A Neuropathy Neuropathy Date Acquired: 05/17/2020 05/07/2020 N/A Weeks of Treatment: 13 13 N/A Wound Status: Open Open N/A Measurements L x W  x D (cm) 0.5x0.5x0.7 1x1x0.5 N/A Area (cm) : 0.196 0.785 N/A Volume (cm) : 0.137 0.393 N/A % Reduction in Area: 16.90% 39.40% N/A % Reduction in Volume: -93.00% -51.70% N/A Classification: Full Thickness Without Exposed Grade 1 N/A Support Structures Exudate Amount: Medium Medium N/A Exudate Type: Serosanguineous Serous N/A Exudate Color: red, brown amber N/A Wound Margin: Flat and Intact Epibole N/A Granulation Amount: Large (67-100%) None Present (0%) N/A Granulation Quality: Pink N/A N/A Necrotic Amount: None Present (0%) Large (67-100%) N/A Exposed Structures: Fat Layer (Subcutaneous Tissue): Fat Layer (Subcutaneous Tissue): N/A Yes Yes Fascia: No Fascia: No Tendon: No Tendon: No Muscle: No Muscle: No Joint: No Joint: No Bone: No Bone: No Epithelialization: Small (1-33%) None N/A Treatment Notes Electronic Signature(s) Signed: 09/09/2020 4:08:44 PM By: Grover Canavan Entered By: Grover Canavan on 09/09/2020 15:40:51 [name] ([id number]) [name]  ([id number]) -------------------------------------------------------------------------------- Multi-Disciplinary Care Plan Details Patient Name: [name] Date of Service: 09/09/2020 3:30 PM Medical Record Number: [id number] Patient Account Number: [id number] Date of Birth/Sex: [birth year] (59 y.o. M) Treating RN: Grover Canavan Primary Care Juliany Daughety: Merrie Roof Other Clinician: Referring Eleisha Branscomb: Merrie Roof Treating Kanae Ignatowski/Extender: Melburn Hake, HOYT Weeks in Treatment: 13 Active Inactive Osteomyelitis Nursing Diagnoses: Potential for infection: osteomyelitis Goals: Diagnostic evaluation for osteomyelitis completed as ordered Date Initiated: 06/07/2020 Date Inactivated: 07/12/2020 Target Resolution Date: 07/06/2020 Goal Status: Met Patient/caregiver will verbalize understanding of disease process and disease management Date Initiated: 06/07/2020 Date Inactivated: 07/12/2020 Target Resolution Date: 07/06/2020 Goal Status: Met Patient's osteomyelitis will resolve Date Initiated: 06/07/2020 Target Resolution Date: 08/18/2020 Goal Status: Active Signs and symptoms for osteomyelitis will be recognized and promptly addressed Date Initiated: 06/07/2020 Date Inactivated: 07/12/2020 Target Resolution Date: 07/06/2020 Goal Status: Met Interventions: Assess for signs and symptoms of osteomyelitis resolution every visit Provide education on osteomyelitis Notes: Wound/Skin Impairment Nursing Diagnoses: Impaired tissue integrity Goals: Patient/caregiver will verbalize understanding of skin care regimen Date Initiated: 06/07/2020 Date Inactivated: 07/12/2020 Target Resolution Date: 07/06/2020 Goal Status: Met Ulcer/skin breakdown will have a volume reduction of 30% by week 4 Date Initiated: 06/07/2020 Date Inactivated: 07/12/2020 Target Resolution Date: 07/06/2020 Goal Status: Met Ulcer/skin breakdown will have a volume reduction of 50% by week 8 Date Initiated: 07/28/2020 Target Resolution  Date: 08/06/2020 Goal Status: Active Interventions: Assess patient/caregiver ability to obtain necessary supplies Assess ulceration(s) every visit Treatment Activities: Referred to DME Trenace Coughlin for dressing supplies : 06/07/2020 Notes: Electronic Signature(s) Signed: 09/09/2020 4:08:44 PM By: Ellison Hughs, Nasire ([id number]) Entered By: Grover Canavan on 09/09/2020 15:40:42 Viner, Saleh ([id number]) -------------------------------------------------------------------------------- Pain Assessment Details Patient Name: [name] Date of Service: 09/09/2020 3:30 PM Medical Record Number: [id number] Patient Account Number: [id number] Date of Birth/Sex: 21-Sep-1961 (59 y.o. M) Treating RN: Darci Needle Primary Care Galileah Piggee: Merrie Roof Other Clinician: Referring Jayshon Dommer: Merrie Roof Treating Alysiana Ethridge/Extender: Melburn Hake, HOYT Weeks in Treatment: 13 Active Problems Location of Pain Severity and Description of Pain Patient Has Paino No Site Locations With Dressing Change: No Pain Management and Medication Current Pain Management: Electronic Signature(s) Signed: 09/09/2020 4:12:49 PM By: Darci Needle Entered By: Darci Needle on 09/09/2020 15:35:29 Pun, Nason ([id number]) -------------------------------------------------------------------------------- Patient/Caregiver Education Details Patient Name: [name] Date of Service: 09/09/2020 3:30 PM Medical Record Number: [id number] Patient Account Number: [id number] Date of Birth/Gender: [birth year] (59 y.o. M) Treating RN: Grover Canavan Primary Care Physician: Merrie Roof Other Clinician: Referring Physician: Merrie Roof Treating Physician/Extender: Sharalyn Ink in Treatment: 13 Education Assessment Education Provided To: Patient Education Topics Provided Wound/Skin Impairment: Methods: Explain/Verbal Responses: State  content correctly Electronic Signature(s) Signed: 09/09/2020  4:08:44 PM By: Grover Canavan Entered By: Grover Canavan on 09/09/2020 15:41:06 Mcswain, Abass ([id number]) -------------------------------------------------------------------------------- Wound Assessment Details Patient Name: [name] Date of Service: 09/09/2020 3:30 PM Medical Record Number: [id number] Patient Account Number: [id number] Date of Birth/Sex: 03/09/1961 (59 y.o. M) Treating RN: Darci Needle Primary Care Elasha Tess: Merrie Roof Other Clinician: Referring Jaylah Goodlow: Merrie Roof Treating Scarlett Portlock/Extender: Melburn Hake, HOYT Weeks in Treatment: 13 Wound Status Wound Number: 1 Primary Etiology: Trauma, Other Wound Location: Right, Plantar Foot Secondary Etiology: Diabetic Wound/Ulcer of the Lower Extremity Wounding Event: Trauma Wound Status: Open Date Acquired: 05/17/2020 Comorbid History: Cataracts, Type II Diabetes, Neuropathy Weeks Of Treatment: 13 Clustered Wound: No Photos Wound Measurements Length: (cm) 0.5 Width: (cm) 0.5 Depth: (cm) 0.7 Area: (cm) 0.196 Volume: (cm) 0.137 % Reduction in Area: 16.9% % Reduction in Volume: -93% Epithelialization: Small (1-33%) Wound Description Classification: Full Thickness Without Exposed Support Structu Wound Margin: Flat and Intact Exudate Amount: Medium Exudate Type: Serosanguineous Exudate Color: red, brown res Foul Odor After Cleansing: No Slough/Fibrino No Wound Bed Granulation Amount: Large (67-100%) Exposed Structure Granulation Quality: Pink Fascia Exposed: No Necrotic Amount: None Present (0%) Fat Layer (Subcutaneous Tissue) Exposed: Yes Tendon Exposed: No Muscle Exposed: No Joint Exposed: No Bone Exposed: No Treatment Notes Wound #1 (Right, Plantar Foot) Notes scell, gauze, conform, tape Electronic Signature(s) Signed: 09/09/2020 4:12:49 PM By: Mauri Reading, Tequan ([id number]) Entered By: Darci Needle on 09/09/2020 15:36:59 Habig, Riordan  ([id number]) -------------------------------------------------------------------------------- Wound Assessment Details Patient Name: [name] Date of Service: 09/09/2020 3:30 PM Medical Record Number: [id number] Patient Account Number: [id number] Date of Birth/Sex: 1961/10/27 (59 y.o. M) Treating RN: Darci Needle Primary Care [place] Breeding: Merrie Roof Other Clinician: Referring Ansleigh Safer: Merrie Roof Treating Promise Bushong/Extender: Melburn Hake, HOYT Weeks in Treatment: 13 Wound Status Wound Number: 2 Primary Etiology: Diabetic Wound/Ulcer of the Lower Extremity Wound Location: Left, Anterior Toe Great Wound Status: Open Wounding Event: Gradually Appeared Comorbid History: Cataracts, Type II Diabetes, Neuropathy Date Acquired: 05/07/2020 Weeks Of Treatment: 13 Clustered Wound: No Photos Wound Measurements Length: (cm) 1 Width: (cm) 1 Depth: (cm) 0.5 Area: (cm) 0.785 Volume: (cm) 0.393 % Reduction in Area: 39.4% % Reduction in Volume: -51.7% Epithelialization: None Wound Description Classification: Grade 1 F Wound Margin: Epibole S Exudate Amount: Medium Exudate Type: Serous Exudate Color: amber oul Odor After Cleansing: No lough/Fibrino Yes Wound Bed Granulation Amount: None Present (0%) Exposed Structure Necrotic Amount: Large (67-100%) Fascia Exposed: No Necrotic Quality: Adherent Slough Fat Layer (Subcutaneous Tissue) Exposed: Yes Tendon Exposed: No Muscle Exposed: No Joint Exposed: No Bone Exposed: No Treatment Notes Wound #2 (Left, Anterior Toe Great) Notes scell, gauze, conform, tape Electronic Signature(s) Signed: 09/09/2020 4:12:49 PM By: Mauri Reading, Rumeal ([id number]) Entered By: Darci Needle on 09/09/2020 15:37:17 [place], Gavyn ([id number]) -------------------------------------------------------------------------------- Vitals Details Patient Name: [name] Date of Service: 09/09/2020 3:30 PM Medical Record Number:  [id number] Patient Account Number: [id number] Date of Birth/Sex: 1961-10-09 (59 y.o. M) Treating RN: Darci Needle Primary Care Elanor Cale: Merrie Roof Other Clinician: Referring Shabreka Coulon: Merrie Roof Treating Zyanna Leisinger/Extender: Melburn Hake, HOYT Weeks in Treatment: 13 Vital Signs Time Taken: 15:25 Temperature (F): 98.5 Height (in): 72 Pulse (bpm): 87 Weight (lbs): 249 Respiratory Rate (breaths/min): 18 Body Mass Index (BMI): 33.8 Blood Pressure (mmHg): 149/85 Reference Range: 80 - 120 mg / dl Electronic Signature(s) Signed: 09/09/2020 4:12:49 PM By: Darci Needle Previous Signature: 09/09/2020 3:28:48 PM Version By: Lorine Bears RCP, RRT, CHT Entered By: Darci Needle on 09/09/2020 15:35:17

## 2020-09-10 NOTE — Progress Notes (Signed)
[name], [name] ([id number]) Visit Report for 09/09/2020 Chief Complaint Document Details Patient Name: [name], Devarious Date of Service: 09/09/2020 3:30 PM Medical Record Number: [id number] Patient Account Number: [id number] Date of Birth/Sex: 06/08/1961 (59 y.o. M) Treating RN: Tyler AasButler, Michelle Primary Care Provider: Roosvelt MaserLANE, RACHEL Other Clinician: Referring Provider: Roosvelt MaserLANE, RACHEL Treating Provider/Extender: Linwood DibblesSTONE III, Whitten Andreoni Weeks in Treatment: 13 Information Obtained from: Patient Chief Complaint Left foot/toe ulcers Electronic Signature(s) Signed: 09/09/2020 4:34:21 PM By: Lenda KelpStone III, [name], [name] on 09/09/2020 16:34:21 [name], [name] ([id number]) -------------------------------------------------------------------------------- Debridement Details Patient Name: [name], [name] Date of Service: 09/09/2020 3:30 PM Medical Record Number: [id number] Patient Account Number: [id number] Date of Birth/Sex: 12/02/1961 (59 y.o. M) Treating RN: Tyler AasButler, Michelle Primary Care Provider: Roosvelt MaserLANE, RACHEL Other Clinician: Referring Provider: Roosvelt MaserLANE, RACHEL Treating Provider/Extender: Linwood DibblesSTONE III, Shaniece Bussa Weeks in Treatment: 13 Debridement Performed for Wound #1 Right,Plantar Foot Assessment: Performed By: Physician [name], Darinda [place] E., PA-C Debridement Type: Debridement Severity of Tissue Pre Debridement: Fat layer exposed Level of Consciousness (Pre- Awake and Alert procedure): Pre-procedure Verification/Time Out Yes - 15:41 Taken: Start Time: 15:42 Pain Control: Lidocaine Total Area Debrided (L x W): 0.5 (cm) x 0.5 (cm) = 0.25 (cm) Tissue and other material Viable, Non-Viable, Callus, Slough, Subcutaneous, Slough debrided: Level: Skin/Subcutaneous Tissue Debridement Description: Excisional Instrument: Curette Bleeding: None End Time: 15:50 Procedural Pain: 0 Post Procedural Pain: 0 Response to Treatment: Procedure was tolerated well Level of Consciousness (Post- Awake and  Alert procedure): Post Debridement Measurements of Total Wound Length: (cm) 0.5 Width: (cm) 0.5 Depth: (cm) 0.8 Volume: (cm) 0.157 Character of Wound/Ulcer Post Debridement: Improved Severity of Tissue Post Debridement: Fat layer exposed Post Procedure Diagnosis Same as Pre-procedure Electronic Signature(s) Signed: 09/09/2020 4:08:44 PM By: Tyler AasButler, Michelle Signed: 09/09/2020 6:04:02 PM By: Lenda KelpStone III, Aleeza [place] PA-C Entered By: Tyler AasButler, Michelle on 09/09/2020 15:43:38 [name], [name] ([id number]) -------------------------------------------------------------------------------- Debridement Details Patient Name: [name], [name] Date of Service: 09/09/2020 3:30 PM Medical Record Number: [id number] Patient Account Number: [id number] Date of Birth/Sex: 09/14/1961 (59 y.o. M) Treating RN: Tyler AasButler, Michelle Primary Care Provider: Roosvelt MaserLANE, RACHEL Other Clinician: Referring Provider: Roosvelt MaserLANE, RACHEL Treating Provider/Extender: Linwood DibblesSTONE III, Lavontae Cornia Weeks in Treatment: 13 Debridement Performed for Wound #2 Left,Anterior Toe Great Assessment: Performed By: Physician [name], Ashtan Laton E., PA-C Debridement Type: Debridement Severity of Tissue Pre Debridement: Fat layer exposed Level of Consciousness (Pre- Awake and Alert procedure): Pre-procedure Verification/Time Out Yes - 15:45 Taken: Start Time: 15:46 Pain Control: Lidocaine Total Area Debrided (L x W): 1 (cm) x 1 (cm) = 1 (cm) Tissue and other material Viable, Non-Viable, Slough, Subcutaneous, Slough debrided: Level: Skin/Subcutaneous Tissue Debridement Description: Excisional Instrument: Curette Bleeding: None End Time: 15:51 Procedural Pain: 0 Post Procedural Pain: 0 Response to Treatment: Procedure was tolerated well Level of Consciousness (Post- Awake and Alert procedure): Post Debridement Measurements of Total Wound Length: (cm) 1 Width: (cm) 1 Depth: (cm) 0.6 Volume: (cm) 0.471 Character of Wound/Ulcer Post Debridement:  Improved Severity of Tissue Post Debridement: Fat layer exposed Post Procedure Diagnosis Same as Pre-procedure Electronic Signature(s) Signed: 09/09/2020 4:08:44 PM By: Tyler AasButler, Michelle Signed: 09/09/2020 6:04:02 PM By: Lenda KelpStone III, Elridge Stemm PA-C Entered By: Tyler AasButler, Michelle on 09/09/2020 15:47:30 [name], [name] ([id number]) -------------------------------------------------------------------------------- HPI Details Patient Name: [name], [name] Date of Service: 09/09/2020 3:30 PM Medical Record Number: [id number] Patient Account Number: [id number] Date of Birth/Sex: 12/19/1960 (59 y.o. M) Treating RN: Tyler AasButler, Michelle Primary Care Provider: Roosvelt MaserLANE, RACHEL Other Clinician: Referring Provider: Roosvelt MaserLANE, RACHEL Treating Provider/Extender: Linwood DibblesSTONE III, Asuzena Weis Weeks in Treatment: 13 History of Present  Illness HPI Description: 06/10/2020 upon evaluation today patient appears to be doing poorly in regard to his right plantar foot, left great toe, and left second toe. Unfortunately he has significant wounds at these locations but especially in regard to the second toe which does have a lot of necrotic tissue over the distal portion of the toe. The great toe does have some necrotic tissue as well. With that being said the patient tells me that this has been going on for 1-2 months and seems to be getting worse especially in regard to the toe. He has not had any specific x-rays to identify obvious signs of osteomyelitis up to this point that something would likely get a different today as well. With that being said he also has not had any current arterial studies which I think is something else that we probably will look into performing at this point. He does have a history of diabetes mellitus type 2, and diabetic neuropathy. Currently he has been using normal shoes for ambulation though he does have some kind of external device so that he does not have to have actual still toed boots anymore at work which he states has  helped nonetheless we is at work he does have to have something that protects his feet as such per policy. He tells me he is having to do a lot more walking right now compared to normal but he can try to see if he can change something in that regard. 06/16/2020 upon evaluation today patient appears to be doing well all things considered with regard to his wounds. I do not feel like anything is it any worse and overall I feel like the Bactrim has done well. The culture that I reviewed did not reveal any specific organisms unfortunately that would help tailor treatment. I did actually review the x-ray as well it does appear that he has evidence of osteomyelitis of the distal tuft of the second toe left foot. That was discussed with him today. He likely will need a referral to infectious disease. 06/30/2020 on evaluation today patient appears to be doing well at this time in regard to his wounds all things considered. The erythema has improved he does have evidence of osteomyelitis again the second toe on the left foot but at the other sites there were no obvious signs. He does have wounds bilaterally on his feet and toes. These are to require sharp debridement today. He is still pending as far as his appointment with infectious disease. With that being said this is actually scheduled for 29 July which is this month. He also has an appointment on the 27th with vascular. Overall at least we are getting to the point where I think we are getting something is taken care of here. 07/12/2020 patient did have his appointment with [facility] infectious disease. They have placed him on medications which include Flagyl 500 mg and Levaquin 750 mg for the next several weeks until September 9. Other than that he overall seems to be looking quite well and I am very pleased with how things are progressing at this point. 07/21/2020 on evaluation today patient appears to be doing well with regard to his wounds. He is on Flagyl as  well as Levaquin which is helping to take care of the infection it appears. Fortunately there is no signs of active infection systemically at this time which is also good news. I am very pleased with where things stand. 07/28/20 on evaluation today patient appears to  be doing well with regard to his wounds. He has been tolerating the dressing changes without complication we've been using the alginate currently. With that being said I do feel like the patient is making good progress albeit slow. 08/04/2020 on evaluation today patient's wound actually is showing signs of good improvement which is great news and overall very pleased with where things stand. Especially in regard to the right foot. The left great toe unfortunately is still having a lot of drainage and is somewhat macerated. This actually does appear to go deeper and is actually showing signs of bone exposure in the base of the wound. Again he is on antibiotics and I think we could consider HBO therapy in fact this is something I may discuss with him next week depending on how the toe looks. With that being said in the short-term I think I am going to actually recommend that he change the dressing more frequently right has been doing every other day 08/16/2020 on evaluation today patient appears to be doing well with regard to his ulcers. He is going require some debridement of both sites remaining he still is having a lot of drainage from the toe but again this seems to be making some progress here just very slowly. I did discuss with him the possibility of hyperbarics today but he really was not interested in that at this point. 08/25/2020 upon evaluation today patient's wounds appear to be doing okay at this point. He is progressing quite slowly at this time but nonetheless does seem to be making fairly good progress at this time which is good news. Fortunately there is no signs of active infection at this time. He does have an appointment  with his infectious disease doctor next week on Thursday therefore he will not be able to make the appointment here we will likely see him in 2 weeks in that regard. 09/09/2020 upon evaluation today patient appears to be doing well currently in regard to his wounds. He still has openings at both locations although the great toe on the left does appear to be a little smaller to me which is good news. Fortunately there is no signs of active infection at this time which is excellent. Electronic Signature(s) Signed: 09/09/2020 5:49:53 PM By: Lenda Kelp PA-C Entered By: Lenda Kelp on 09/09/2020 17:49:53 [name], [name] ([id number]) -------------------------------------------------------------------------------- Physical Exam Details Patient Name: [name] Lenz Date of Service: 09/09/2020 3:30 PM Medical Record Number: [id number] Patient Account Number: [id number] Date of Birth/Sex: 02-Feb-1961 (59 y.o. M) Treating RN: Tyler Aas Primary Care Provider: Roosvelt Maser Other Clinician: Referring Provider: Roosvelt Maser Treating Provider/Extender: Linwood Dibbles, Quinton Voth Weeks in Treatment: 13 Constitutional patient is hypertensive.. respirations regular, non-labored and within target range for patient.Marland Kitchen temperature within target range for patient.. Well- nourished and well-hydrated in no acute distress. Respiratory normal breathing without difficulty. Psychiatric this patient is able to make decisions and demonstrates good insight into disease process. Alert and Oriented x 3. pleasant and cooperative. Notes Patient's wounds currently did require sharp debridement at both locations and he actually tolerated the debridement today without complication post debridement wound bed appears to be doing much better which is great news. Electronic Signature(s) Signed: 09/09/2020 6:01:37 PM By: Lenda Kelp PA-C Entered By: Lenda Kelp on 09/09/2020 18:01:37 [name], [name]  ([id number]) -------------------------------------------------------------------------------- Physician Orders Details Patient Name: [name] Lenz Date of Service: 09/09/2020 3:30 PM Medical Record Number: [id number] Patient Account Number: [id number] Date of Birth/Sex: 14-Jan-1961 (59 y.o. M) Treating RN:  Tyler Aas Primary Care Provider: Roosvelt Maser Other Clinician: Referring Provider: Roosvelt Maser Treating Provider/Extender: Linwood Dibbles, Tremel Setters Weeks in Treatment: 19 Verbal / Phone Orders: No Diagnosis Coding Wound Cleansing Wound #1 Right,Plantar Foot o Clean wound with Normal Saline. Wound #2 Left,Anterior Toe Great o Clean wound with Normal Saline. Primary Wound Dressing Wound #1 Right,Plantar Foot o Silver Alginate Wound #2 Left,Anterior Toe Great o Silver Alginate Secondary Dressing Wound #1 Right,Plantar Foot o Gauze and Kerlix/Conform Wound #2 Left,Anterior Toe Great o Gauze and Kerlix/Conform Dressing Change Frequency Wound #1 Right,Plantar Foot o Change Dressing Monday, Wednesday, Friday Wound #2 Left,Anterior Toe Great o Change dressing every day. - may change twice daily if needed to keep dry Follow-up Appointments Wound #1 Right,Plantar Foot o Return Appointment in 3 weeks. Wound #2 Left,Anterior Toe Great o Return Appointment in 3 weeks. Edema Control Wound #1 Right,Plantar Foot o Elevate legs to the level of the heart and pump ankles as often as possible Wound #2 Left,Anterior Toe Great o Elevate legs to the level of the heart and pump ankles as often as possible Additional Orders / Instructions Wound #1 Right,Plantar Foot o Increase protein intake. Wound #2 Left,Anterior Toe Great o Increase protein intake. Electronic Signature(s) Signed: 09/09/2020 4:08:44 PM By: Tyler Aas Signed: 09/09/2020 6:04:02 PM By: Judithe Modest, [name] ([id number]) Entered By: Tyler Aas on 09/09/2020 15:51:53 Haver,  Fishel ([id number]) -------------------------------------------------------------------------------- Problem List Details Patient Name: [name] Lenz Date of Service: 09/09/2020 3:30 PM Medical Record Number: [id number] Patient Account Number: [id number] Date of Birth/Sex: 1961/03/28 (59 y.o. 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 06/07/2020 No Yes exposed L97.512 Non-pressure chronic ulcer of other part of right foot with fat layer 06/07/2020 No Yes exposed E11.40 Type 2 diabetes mellitus with diabetic neuropathy, unspecified 06/07/2020 No Yes Inactive Problems Resolved Problems Electronic Signature(s) Signed: 09/09/2020 4:34:05 PM By: Lenda Kelp PA-C Entered By: Lenda Kelp on 09/09/2020 16:34:05 [name], [name] ([id number]) -------------------------------------------------------------------------------- Progress Note Details Patient Name: [name] Lenz Date of Service: 09/09/2020 3:30 PM Medical Record Number: [id number] Patient Account Number: [id number] Date of Birth/Sex: Jan 13, 1961 (59 y.o. M) Treating RN: Tyler Aas Primary Care Provider: Roosvelt Maser Other Clinician: Referring Provider: Roosvelt Maser Treating Provider/Extender: Linwood Dibbles, Ziyon Soltau Weeks in Treatment: 13 Subjective Chief Complaint Information obtained from Patient Left foot/toe ulcers History of Present Illness (HPI) 06/10/2020 upon evaluation today patient appears to be doing poorly in regard to his right plantar foot, left great toe, and left second toe. Unfortunately he has significant wounds at these locations but especially in regard  to the second toe which does have a lot of necrotic tissue over the distal portion of the toe. The great toe does have some necrotic tissue as well. With that being said the patient tells me that this has been going on for 1-2 months and seems to be getting worse especially in regard to the toe. He has not had any specific x-rays to identify obvious signs of osteomyelitis up to this point that something would likely get a different today as well. With that being said he also has not had any current arterial studies which I think is something else that we probably will  look into performing at this point. He does have a history of diabetes mellitus type 2, and diabetic neuropathy. Currently he has been using normal shoes for ambulation though he does have some kind of external device so that he does not have to have actual still toed boots anymore at work which he states has helped nonetheless we is at work he does have to have something that protects his feet as such per policy. He tells me he is having to do a lot more walking right now compared to normal but he can try to see if he can change something in that regard. 06/16/2020 upon evaluation today patient appears to be doing well all things considered with regard to his wounds. I do not feel like anything is it any worse and overall I feel like the Bactrim has done well. The culture that I reviewed did not reveal any specific organisms unfortunately that would help tailor treatment. I did actually review the x-ray as well it does appear that he has evidence of osteomyelitis of the distal tuft of the second toe left foot. That was discussed with him today. He likely will need a referral to infectious disease. 06/30/2020 on evaluation today patient appears to be doing well at this time in regard to his wounds all things considered. The erythema has improved he does have evidence of osteomyelitis again the second toe on the left foot but at the other  sites there were no obvious signs. He does have wounds bilaterally on his feet and toes. These are to require sharp debridement today. He is still pending as far as his appointment with infectious disease. With that being said this is actually scheduled for 29 July which is this month. He also has an appointment on the 27th with vascular. Overall at least we are getting to the point where I think we are getting something is taken care of here. 07/12/2020 patient did have his appointment with [facility] infectious disease. They have placed him on medications which include Flagyl 500 mg and Levaquin 750 mg for the next several weeks until September 9. Other than that he overall seems to be looking quite well and I am very pleased with how things are progressing at this point. 07/21/2020 on evaluation today patient appears to be doing well with regard to his wounds. He is on Flagyl as well as Levaquin which is helping to take care of the infection it appears. Fortunately there is no signs of active infection systemically at this time which is also good news. I am very pleased with where things stand. 07/28/20 on evaluation today patient appears to be doing well with regard to his wounds. He has been tolerating the dressing changes without complication we've been using the alginate currently. With that being said I do feel like the patient is making good progress albeit slow. 08/04/2020 on evaluation today patient's wound actually is showing signs of good improvement which is great news and overall very pleased with where things stand. Especially in regard to the right foot. The left great toe unfortunately is still having a lot of drainage and is somewhat macerated. This actually does appear to go deeper and is actually showing signs of bone exposure in the base of the wound. Again he is on antibiotics and I think we could consider HBO therapy in fact this is something I may discuss with him next week depending on  how the toe looks. With that being said in the short-term I  think I am going to actually recommend that he change the dressing more frequently right has been doing every other day 08/16/2020 on evaluation today patient appears to be doing well with regard to his ulcers. He is going require some debridement of both sites remaining he still is having a lot of drainage from the toe but again this seems to be making some progress here just very slowly. I did discuss with him the possibility of hyperbarics today but he really was not interested in that at this point. 08/25/2020 upon evaluation today patient's wounds appear to be doing okay at this point. He is progressing quite slowly at this time but nonetheless does seem to be making fairly good progress at this time which is good news. Fortunately there is no signs of active infection at this time. He does have an appointment with his infectious disease doctor next week on Thursday therefore he will not be able to make the appointment here we will likely see him in 2 weeks in that regard. 09/09/2020 upon evaluation today patient appears to be doing well currently in regard to his wounds. He still has openings at both locations although the great toe on the left does appear to be a little smaller to me which is good news. Fortunately there is no signs of active infection at this time which is excellent. [name], [name] ([id number]) Objective Constitutional patient is hypertensive.. respirations regular, non-labored and within target range for patient.Marland Kitchen temperature within target range for patient.. Well- nourished and well-hydrated in no acute distress. Vitals Time Taken: 3:25 PM, Height: 72 in, Weight: 249 lbs, BMI: 33.8, Temperature: 98.5 F, Pulse: 87 bpm, Respiratory Rate: 18 breaths/min, Blood Pressure: 149/85 mmHg. Respiratory normal breathing without difficulty. Psychiatric this patient is able to make decisions and demonstrates good insight  into disease process. Alert and Oriented x 3. pleasant and cooperative. General Notes: Patient's wounds currently did require sharp debridement at both locations and he actually tolerated the debridement today without complication post debridement wound bed appears to be doing much better which is great news. Integumentary (Hair, Skin) Wound #1 status is Open. Original cause of wound was Trauma. The wound is located on the Right,Plantar Foot. The wound measures 0.5cm length x 0.5cm width x 0.7cm depth; 0.196cm^2 area and 0.137cm^3 volume. There is Fat Layer (Subcutaneous Tissue) exposed. There is a medium amount of serosanguineous drainage noted. The wound margin is flat and intact. There is large (67-100%) pink granulation within the wound bed. There is no necrotic tissue within the wound bed. Wound #2 status is Open. Original cause of wound was Gradually Appeared. The wound is located on the [employer]. The wound measures 1cm length x 1cm width x 0.5cm depth; 0.785cm^2 area and 0.393cm^3 volume. There is Fat Layer (Subcutaneous Tissue) exposed. There is a medium amount of serous drainage noted. The wound margin is epibole. There is no granulation within the wound bed. There is a large (67-100%) amount of necrotic tissue within the wound bed including Adherent Slough. Assessment Active Problems ICD-10 Type 2 diabetes mellitus with foot ulcer Non-pressure chronic ulcer of other part of left foot with fat layer exposed Non-pressure chronic ulcer of other part of right foot with fat layer exposed Type 2 diabetes mellitus with diabetic neuropathy, unspecified Procedures Wound #1 Pre-procedure diagnosis of Wound #1 is a Trauma, Other located on the Right,Plantar Foot .Severity of Tissue Pre Debridement is: Fat layer exposed. There was a Excisional Skin/Subcutaneous Tissue Debridement with a total area  of 0.25 sq cm performed by [name], Peyten Weare E., PA-C. With the following  instrument(s): Curette to remove Viable and Non-Viable tissue/material. Material removed includes Callus, Subcutaneous Tissue, and Slough after achieving pain control using Lidocaine. No specimens were taken. A time out was conducted at 15:41, prior to the start of the procedure. There was no bleeding. The procedure was tolerated well with a pain level of 0 throughout and a pain level of 0 following the procedure. Post Debridement Measurements: 0.5cm length x 0.5cm width x 0.8cm depth; 0.157cm^3 volume. Character of Wound/Ulcer Post Debridement is improved. Severity of Tissue Post Debridement is: Fat layer exposed. Post procedure Diagnosis Wound #1: Same as Pre-Procedure Wound #2 Pre-procedure diagnosis of Wound #2 is a Diabetic Wound/Ulcer of the Lower Extremity located on the Left,Anterior Toe Great .Severity of Tissue Pre Debridement is: Fat layer exposed. There was a Excisional Skin/Subcutaneous Tissue Debridement with a total area of 1 sq cm performed by [name], Ulys Favia E., PA-C. With the following instrument(s): Curette to remove Viable and Non-Viable tissue/material. Material removed includes Subcutaneous Tissue and Slough and after achieving pain control using Lidocaine. No specimens were taken. A time out was conducted at 15:45, prior to the start of the procedure. There was no bleeding. The procedure was tolerated well with a pain level of 0 throughout and a pain level of 0 following the procedure. Post Debridement Measurements: 1cm length x 1cm width x 0.6cm depth; 0.471cm^3 volume. Character of Wound/Ulcer Post Debridement is improved. Severity of Tissue Post Debridement is: Fat layer exposed. Post procedure Diagnosis Wound #2: Same as Pre-Procedure Weiher, Tyren ([id number]) Plan Wound Cleansing: Wound #1 Right,Plantar Foot: Clean wound with Normal Saline. Wound #2 Left,Anterior Toe Great: Clean wound with Normal Saline. Primary Wound Dressing: Wound #1 Right,Plantar  Foot: Silver Alginate Wound #2 Left,Anterior Toe Great: Silver Alginate Secondary Dressing: Wound #1 Right,Plantar Foot: Gauze and Kerlix/Conform Wound #2 Left,Anterior Toe Great: Gauze and Kerlix/Conform Dressing Change Frequency: Wound #1 Right,Plantar Foot: Change Dressing Monday, Wednesday, Friday Wound #2 Left,Anterior Toe Great: Change dressing every day. - may change twice daily if needed to keep dry Follow-up Appointments: Wound #1 Right,Plantar Foot: Return Appointment in 3 weeks. Wound #2 Left,Anterior Toe Great: Return Appointment in 3 weeks. Edema Control: Wound #1 Right,Plantar Foot: Elevate legs to the level of the heart and pump ankles as often as possible Wound #2 Left,Anterior Toe Great: Elevate legs to the level of the heart and pump ankles as often as possible Additional Orders / Instructions: Wound #1 Right,Plantar Foot: Increase protein intake. Wound #2 Left,Anterior Toe Great: Increase protein intake. 1. Would recommend currently that we will continue with the wound care measures as before and the patient is in agreement with the plan. This includes the use of silver alginate dressing at this point. 2. I am also can recommend that he continue to change the dressings frequently to keep everything clean and dry that seems to be doing well for him. We will see patient back for reevaluation in 3 weeks here in the clinic. If anything worsens or changes patient will contact our office for additional recommendations. Electronic Signature(s) Signed: 09/09/2020 6:02:18 PM By: Lenda Kelp PA-C Entered By: Lenda Kelp on 09/09/2020 18:02:18 Livengood, Danzel ([id number]) -------------------------------------------------------------------------------- SuperBill Details Patient Name: [name] Lenz Date of Service: 09/09/2020 Medical Record Number: [id number] Patient Account Number: [id number] Date of Birth/Sex: 10/21/1961 (59 y.o. 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 E11.40 Type 2 diabetes mellitus with diabetic neuropathy, unspecified Facility Procedures CPT4 Code: 62863817 Description: 11042 - DEB SUBQ TISSUE 20 SQ CM/< Modifier: Quantity: 1 CPT4 Code: Description: ICD-10 Diagnosis Description L97.522 Non-pressure chronic ulcer of other part of left foot with fat layer exp L97.512 Non-pressure chronic ulcer of other part of right foot with fat layer ex Modifier: osed posed Quantity: Physician Procedures CPT4 Code: 7116579 Description: 11042 - WC PHYS SUBQ TISS 20 SQ CM Modifier: Quantity: 1 CPT4 Code: Description: ICD-10 Diagnosis Description L97.522 Non-pressure chronic ulcer of other part of left foot with fat layer exp L97.512 Non-pressure chronic ulcer of other part of right foot with fat layer ex Modifier: osed posed Quantity: Electronic Signature(s) Signed: 09/09/2020 6:03:29 PM By: Lenda Kelp PA-C Entered By: Lenda Kelp on 09/09/2020 18:03:29

## 2020-09-30 ENCOUNTER — Encounter: Payer: BC Managed Care – PPO | Admitting: Physician Assistant

## 2020-09-30 ENCOUNTER — Other Ambulatory Visit: Payer: Self-pay

## 2020-09-30 DIAGNOSIS — E11621 Type 2 diabetes mellitus with foot ulcer: Secondary | ICD-10-CM | POA: Diagnosis not present

## 2020-09-30 DIAGNOSIS — L97522 Non-pressure chronic ulcer of other part of left foot with fat layer exposed: Secondary | ICD-10-CM | POA: Diagnosis not present

## 2020-09-30 DIAGNOSIS — L97512 Non-pressure chronic ulcer of other part of right foot with fat layer exposed: Secondary | ICD-10-CM | POA: Diagnosis not present

## 2020-09-30 NOTE — Progress Notes (Addendum)
CAPRI, RABEN ([id number]) Visit Report for 09/30/2020 Chief Complaint Document Details Patient Name: [name] Date of Service: 09/30/2020 3:30 PM Medical Record Number: [id number] Patient Account Number: [id number] Date of Birth/Sex: May 02, 1961 (59 y.o. M) Treating RN: Huel Coventry Primary Care Provider: Roosvelt Maser Other Clinician: Referring Provider: Roosvelt Maser Treating Provider/Extender: Rowan Blase in Treatment: 16 Information Obtained from: Patient Chief Complaint Left foot/toe ulcers Electronic Signature(s) Signed: 09/30/2020 3:59:33 PM By: Lenda Kelp PA-C Entered By: Lenda Kelp on 09/30/2020 15:59:33 [name], [name] ([id number]) -------------------------------------------------------------------------------- HPI Details Patient Name: [name] Date of Service: 09/30/2020 3:30 PM Medical Record Number: [id number] Patient Account Number: [id number] Date of Birth/Sex: 01/01/1961 (59 y.o. M) Treating RN: Huel Coventry Primary Care Provider: Roosvelt Maser Other Clinician: Referring Provider: Roosvelt Maser Treating Provider/Extender: Rowan Blase in Treatment: 16 History of Present Illness HPI Description: 06/10/2020 upon evaluation today patient appears to be doing poorly in regard to his right plantar foot, left great toe, and left second toe. Unfortunately he has significant wounds at these locations but especially in regard to the second toe which does have a lot of necrotic tissue over the distal portion of the toe. The great toe does have some necrotic tissue as well. With that being said the patient tells me that this has been going on for 1-2 months and seems to be getting worse especially in regard to the toe. He has not had any specific x-rays to identify obvious signs of osteomyelitis up to this point that something would likely get a different today as well. With that being said he also has not had any current arterial studies which I think is  something else that we probably will look into performing at this point. He does have a history of diabetes mellitus type 2, and diabetic neuropathy. Currently he has been using normal shoes for ambulation though he does have some kind of external device so that he does not have to have actual still toed boots anymore at work which he states has helped nonetheless we is at work he does have to have something that protects his feet as such per policy. He tells me he is having to do a lot more walking right now compared to normal but he can try to see if he can change something in that regard. 06/16/2020 upon evaluation today patient appears to be doing well all things considered with regard to his wounds. I do not feel like anything is it any worse and overall I feel like the Bactrim has done well. The culture that I reviewed did not reveal any specific organisms unfortunately that would help tailor treatment. I did actually review the x-ray as well it does appear that he has evidence of osteomyelitis of the distal tuft of the second toe left foot. That was discussed with him today. He likely will need a referral to infectious disease. 06/30/2020 on evaluation today patient appears to be doing well at this time in regard to his wounds all things considered. The erythema has improved he does have evidence of osteomyelitis again the second toe on the left foot but at the other sites there were no obvious signs. He does have wounds bilaterally on his feet and toes. These are to require sharp debridement today. He is still pending as far as his appointment with infectious disease. With that being said this is actually scheduled for 29 July which is this month. He also has an appointment on the 27th with vascular. Overall  at least we are getting to the point where I think we are getting something is taken care of here. 07/12/2020 patient [facility] They have placed him  on medications which include Flagyl 500 mg and Levaquin 750 mg for the next several weeks until September 9. Other than that he overall seems to be looking quite well and I am very pleased with how things are progressing at this point. 07/21/2020 on evaluation today patient appears to be doing well with regard to his wounds. He is on Flagyl as well as Levaquin which is helping to take care of the infection it appears. Fortunately there is no signs of active infection systemically at this time which is also good news. I am very pleased with where things stand. 07/28/20 on evaluation today patient appears to be doing well with regard to his wounds. He has been tolerating the dressing changes without complication we've been using the alginate currently. With that being said I do feel like the patient is making good progress albeit slow. 08/04/2020 on evaluation today patient's wound actually is showing signs of good improvement which is great news and overall very pleased with where things stand. Especially in regard to the right foot. The left great toe unfortunately is still having a lot of drainage and is somewhat macerated. This actually does appear to go deeper and is actually showing signs of bone exposure in the base of the wound. Again he is on antibiotics and I think we could consider HBO therapy in fact this is something I may discuss with him next week depending on how the toe looks. With that being said in the short-term I think I am going to actually recommend that he change the dressing more frequently right has been doing every other day 08/16/2020 on evaluation today patient appears to be doing well with regard to his ulcers. He is going require some debridement of both sites remaining he still is having a lot of drainage from the toe but again this seems to be making some progress here just very slowly. I did discuss with him the possibility of hyperbarics today but he really was not  interested in that at this point. 08/25/2020 upon evaluation today patient's wounds appear to be doing okay at this point. He is progressing quite slowly at this time but nonetheless does seem to be making fairly good progress at this time which is good news. Fortunately there is no signs of active infection at this time. He does have an appointment with his infectious disease doctor next week on Thursday therefore he will not be able to make the appointment here we will likely see him in 2 weeks in that regard. 09/09/2020 upon evaluation today patient appears to be doing well currently in regard to his wounds. He still has openings at both locations although the great toe on the left does appear to be a little smaller to me which is good news. Fortunately there is no signs of active infection at this time which is excellent. 09/30/2020 on evaluation today patient appears to be doing well with regard to his left great toe ulcer this is measuring a little smaller. In regard to the right foot plantar unfortunately this is not really significantly improved. I think the biggest issue here is he continues to wear his work shoes and walks sufficiently on this to because it did not really be able to close as effectively as it needs to. We  previously discussed total contact casting but the patient states he cannot do this and work and he also can do this and drive which is essential to him. He tells me that he does not really have any time off and he is very concerned about the fact that he has to continue to work. Again this is a discussion we have had multiple times in the past as well. Electronic Signature(s) Signed: 09/30/2020 4:34:37 PM By: Lenda Kelp PA-C Entered By: Lenda Kelp on 09/30/2020 16:34:37 Worsley, Eddie ([id number]) DIDONATO, Ander ([id number]) -------------------------------------------------------------------------------- Physical Exam Details Patient Name: [name] Date of Service: 09/30/2020 3:30 PM Medical Record Number: [id number] Patient Account Number: [id number] Date of Birth/Sex: [birth year] (59 y.o. M) Treating RN: Huel Coventry Primary Care Provider: Roosvelt Maser Other Clinician: Referring Provider: Roosvelt Maser Treating Provider/Extender: Rowan Blase in Treatment: 16 Constitutional Well-nourished and well-hydrated in no acute distress. Respiratory normal breathing without difficulty. Psychiatric this patient is able to make decisions and demonstrates good insight into disease process. Alert and Oriented x 3. pleasant and cooperative. Notes Upon inspection patient's wounds again on the left great toe actually appears to be doing better although it still healing quite slowly. On the right plantar foot this appears to be clean there is no signs of active infection at this point but nonetheless it still has depth and there is obvious pressure getting to this he really needs to have more appropriate offloading. Electronic Signature(s) Signed: 09/30/2020 4:34:57 PM By: Lenda Kelp PA-C Entered By: Lenda Kelp on 09/30/2020 16:34:57 Macapagal, Oshua ([id number]) -------------------------------------------------------------------------------- Physician Orders Details Patient Name: [name] Date of Service: 09/30/2020 3:30 PM Medical Record Number: [id number] Patient Account Number: [id number] Date of Birth/Sex: 02-22-1961 (59 y.o. 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 Wound #2 Left,Anterior Toe Great o Clean wound with Normal Saline. Primary Wound Dressing Wound #1 Right,Plantar Foot o Silver Alginate - offloading felt Wound #2 Left,Anterior Toe Great o Silver Alginate Secondary Dressing Wound #1 Right,Plantar Foot o Gauze and Kerlix/Conform Wound #2 Left,Anterior Toe Great o Gauze and Kerlix/Conform Dressing Change Frequency Wound #1 Right,Plantar Foot o Change Dressing Monday, Wednesday, Friday Wound #2 Left,Anterior Toe Great o Change dressing every day. - may change twice daily if needed to keep dry Follow-up Appointments Wound #1 Right,Plantar Foot o Return Appointment in 2 weeks. Wound #2 Left,Anterior Toe Great o Return Appointment in 2 weeks. Edema Control Wound #1 Right,Plantar Foot o Elevate legs to the level of the heart and pump ankles as often as possible Wound #2 Left,Anterior Toe Great o Elevate legs to the level of the heart and pump ankles as often as possible Additional Orders / Instructions Wound #1 Right,Plantar Foot o Increase protein intake. Kopke, Hikeem ([id number]) Wound #2 Left,Anterior Toe Great o Increase protein intake. Electronic Signature(s) Signed: 09/30/2020 4:45:00 PM By: Elliot Gurney, BSN, RN, CWS, Kim RN, BSN Signed: 10/03/2020 4:31:22 PM By: Lenda Kelp PA-C Entered By: Elliot Gurney BSN, RN, CWS, Kim on 09/30/2020 16:44:59 Tostenson,  Thermon ([id number]) -------------------------------------------------------------------------------- Problem List Details Patient Name: KOEN, ANTILLA Date of Service: 09/30/2020 3:30 PM Medical Record Number: [id number] Patient Account Number: [id number] Date of Birth/Sex: [birth year] (59 y.o. M) Treating RN: Huel Coventry Primary Care Provider: Roosvelt Maser Other Clinician: Referring Provider: Roosvelt Maser Treating  Provider/Extender: Rowan Blase in Treatment: 16 Active Problems ICD-10 Encounter Code Description Active Date MDM Diagnosis E11.621 Type 2 diabetes mellitus with foot ulcer 06/07/2020 No Yes L97.522 Non-pressure chronic ulcer of other part of left foot with fat layer 06/07/2020 No Yes exposed L97.512 Non-pressure chronic ulcer of other part of right foot with fat layer 06/07/2020 No Yes exposed E11.40 Type 2 diabetes mellitus with diabetic neuropathy, unspecified 06/07/2020 No Yes Inactive Problems Resolved Problems Electronic Signature(s) Signed: 09/30/2020 3:41:16 PM By: Lenda Kelp PA-C Entered By: Lenda Kelp on 09/30/2020 15:41:16 Campusano, Dorin ([id number]) -------------------------------------------------------------------------------- Progress Note Details Patient Name: [name] Date of Service: 09/30/2020 3:30 PM Medical Record Number: [id number] Patient Account Number: [id number] Date of Birth/Sex: 11/28/1961 (59 y.o. M) Treating RN: Huel Coventry Primary Care Provider: Roosvelt Maser Other Clinician: Referring Provider: Roosvelt Maser Treating Provider/Extender: Rowan Blase in Treatment: 16 Subjective Chief Complaint Information obtained from Patient Left foot/toe ulcers History of Present Illness (HPI) 06/10/2020 upon evaluation today patient appears to be doing poorly in regard to his right plantar foot, left great toe, and left second toe. Unfortunately he has significant wounds at these locations but especially in regard to the second toe which does have a lot of necrotic tissue over the distal portion of the toe. The great toe does have some necrotic tissue as well. With that being said the patient tells me that this has been going on for 1-2 months and seems to be getting worse especially in regard to the toe. He has not had any specific x-rays to identify obvious signs of osteomyelitis up to this point that something would likely get a different  today as well. With that being said he also has not had any current arterial studies which I think is something else that we probably will look into performing at this point. He does have a history of diabetes mellitus type 2, and diabetic neuropathy. Currently he has been using normal shoes for ambulation though he does have some kind of external device so that he does not have to have actual still toed boots anymore at work which he states has helped nonetheless we is at work he does have to have something that protects his feet as such per policy. He tells me he is having to do a lot more walking right now compared to normal but he can try to see if he can change something in that regard. 06/16/2020 upon evaluation today patient appears to be doing well all things considered with regard to his wounds. I do not feel like anything is it any worse and overall I feel like the Bactrim has done well. The culture that I reviewed did not reveal any specific organisms unfortunately that would help tailor treatment. I did actually review the x-ray as well it does appear that he has evidence of osteomyelitis of the distal tuft of the second toe left foot. That was discussed with him today. He likely will need a referral to infectious disease. 06/30/2020 on evaluation today patient appears to be doing well at this time in regard to his wounds all things considered. The erythema has improved he does have evidence of osteomyelitis again the second toe on  the left foot but at the other sites there were no obvious signs. He does have wounds bilaterally on his feet and toes. These are to require sharp debridement today. He is still pending as far as his appointment with infectious disease. With that being said this is actually scheduled for 29 July which is this month. He also has an appointment on the 27th with vascular. Overall at least we are getting to the point where I think we are getting something is taken care  of here. 07/12/2020 patient did have his appointment with [facility] infectious disease. They have placed him on medications which include Flagyl 500 mg and Levaquin 750 mg for the next several weeks until September 9. Other than that he overall seems to be looking quite well and I am very pleased with how things are progressing at this point. 07/21/2020 on evaluation today patient appears to be doing well with regard to his wounds. He is on Flagyl as well as Levaquin which is helping to take care of the infection it appears. Fortunately there is no signs of active infection systemically at this time which is also good news. I am very pleased with where things stand. 07/28/20 on evaluation today patient appears to be doing well with regard to his wounds. He has been tolerating the dressing changes without complication we've been using the alginate currently. With that being said I do feel like the patient is making good progress albeit slow. 08/04/2020 on evaluation today patient's wound actually is showing signs of good improvement which is great news and overall very pleased with where things stand. Especially in regard to the right foot. The left great toe unfortunately is still having a lot of drainage and is somewhat macerated. This actually does appear to go deeper and is actually showing signs of bone exposure in the base of the wound. Again he is on antibiotics and I think we could consider HBO therapy in fact this is something I may discuss with him next week depending on how the toe looks. With that being said in the short-term I think I am going to actually recommend that he change the dressing more frequently right has been doing every other day 08/16/2020 on evaluation today patient appears to be doing well with regard to his ulcers. He is going require some debridement of both sites remaining he still is having a lot of drainage from the toe but again this seems to be making some progress here just  very slowly. I did discuss with him the possibility of hyperbarics today but he really was not interested in that at this point. 08/25/2020 upon evaluation today patient's wounds appear to be doing okay at this point. He is progressing quite slowly at this time but nonetheless does seem to be making fairly good progress at this time which is good news. Fortunately there is no signs of active infection at this time. He does have an appointment with his infectious disease doctor next week on Thursday therefore he will not be able to make the appointment here we will likely see him in 2 weeks in that regard. 09/09/2020 upon evaluation today patient appears to be doing well currently in regard to his wounds. He still has openings at both locations although the great toe on the left does appear to be a little smaller to me which is good news. Fortunately there is no signs of active infection at this time which is excellent. 09/30/2020 on evaluation today patient appears  to be doing well with regard to his left great toe ulcer this is measuring a little smaller. In regard to the right foot plantar unfortunately this is not really significantly improved. I think the biggest issue here is he continues to wear his work shoes and walks sufficiently on this to because it did not really be able to close as effectively as it needs to. We previously discussed total contact casting but the patient states he cannot do this and work and he also can do this and drive which is essential to him. He tells me that he does not really have any time off and he is very concerned about the fact that he has to continue to work. Again this is a discussion we have had multiple times in the past as well. [name], [name] ([id number]) Objective Constitutional Well-nourished and well-hydrated in no acute distress. Vitals Time Taken: 3:30 PM, Height: 72 in, Weight: 249 lbs, BMI: 33.8, Temperature: 98.1 F, Pulse: 93 bpm, Respiratory  Rate: 18 breaths/min, Blood Pressure: 159/82 mmHg. Respiratory normal breathing without difficulty. Psychiatric this patient is able to make decisions and demonstrates good insight into disease process. Alert and Oriented x 3. pleasant and cooperative. General Notes: Upon inspection patient's wounds again on the left great toe actually appears to be doing better although it still healing quite slowly. On the right plantar foot this appears to be clean there is no signs of active infection at this point but nonetheless it still has depth and there is obvious pressure getting to this he really needs to have more appropriate offloading. Integumentary (Hair, Skin) Wound #1 status is Open. Original cause of wound was Trauma. The wound is located on the Right,Plantar Foot. The wound measures 0.5cm length x 0.5cm width x 0.5cm depth; 0.196cm^2 area and 0.098cm^3 volume. There is Fat Layer (Subcutaneous Tissue) exposed. There is no tunneling or undermining noted. There is a medium amount of serosanguineous drainage noted. The wound margin is flat and intact. There is large (67-100%) pink granulation within the wound bed. There is no necrotic tissue within the wound bed. Wound #2 status is Open. Original cause of wound was Gradually Appeared. The wound is located on the [employer]. The wound measures 0.2cm length x 0.3cm width x 0.4cm depth; 0.047cm^2 area and 0.019cm^3 volume. There is Fat Layer (Subcutaneous Tissue) exposed. There is no tunneling or undermining noted. There is a medium amount of serous drainage noted. The wound margin is epibole. There is small (1-33%) pale granulation within the wound bed. There is a large (67-100%) amount of necrotic tissue within the wound bed including Adherent Slough. Assessment Active Problems ICD-10 Type 2 diabetes mellitus with foot ulcer Non-pressure chronic ulcer of other part of left foot with fat layer exposed Non-pressure chronic ulcer of  other part of right foot with fat layer exposed Type 2 diabetes mellitus with diabetic neuropathy, unspecified Plan #1. I would recommend at this time that we going to continue with the wound care measures as before and the patient is in agreement with that plan. This is going to include the use of silver alginate dressings at both wound locations followed by orthopedic offloading felt on the right foot. Otherwise were using gauze along with roll gauze to secure in place. #2. I am also can recommend that the patient really needs to strongly consider total contact cast this would mean however he would need someone to drive him from place to place as far as his centrals are concerned  and again it does not sound like he be able to work. For that reason he may need to take short-term disability if this were the case in order to try to get this to heal and then get back to work full duty. #3. I would recommend that the patient continue to monitor for any signs of worsening infection. Obviously if he develops any purulent drainage or otherwise concerning symptoms he should contact the office and let me know as soon as possible. We will see patient back for reevaluation in 1 week here in the clinic. If anything worsens or changes patient will contact our office for additional recommendations. [name], [name] ([id number]) Electronic Signature(s) Signed: 09/30/2020 4:36:31 PM By: Lenda Kelp PA-C Entered By: Lenda Kelp on 09/30/2020 16:36:31 Spruiell, Sha ([id number]) -------------------------------------------------------------------------------- SuperBill Details Patient Name: [name] Date of Service: 09/30/2020 Medical Record Number: [id number] Patient Account Number: [id number] Date of Birth/Sex: 06-09-1961 (59 y.o. 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.512 Non-pressure chronic ulcer of other part of right foot with fat layer exposed E11.40 Type 2 diabetes mellitus with diabetic neuropathy, unspecified Physician Procedures CPT4 Code: [id number] Description: 99213 - WC PHYS LEVEL 3 - EST PT Modifier: Quantity: 1 CPT4 Code: Description: ICD-10 Diagnosis Description E11.621 Type 2 diabetes mellitus with foot ulcer L97.522 Non-pressure chronic ulcer of other part of left foot with fat layer ex L97.512 Non-pressure chronic ulcer of other part of right foot with fat layer e  E11.40 Type 2 diabetes mellitus with diabetic neuropathy, unspecified Modifier: posed xposed Quantity: Electronic Signature(s) Signed: 09/30/2020 4:36:50 PM By: Lenda Kelp PA-C Entered By: Lenda Kelp on 09/30/2020 16:36:49

## 2020-09-30 NOTE — Progress Notes (Signed)
[name], [name] ([id number]) Visit Report for 08/25/2020 Arrival Information Details Patient Name: [name], [name] Date of Service: 08/25/2020 3:15 PM Medical Record Number: [id number] Patient Account Number: [id number] Date of Birth/Sex: March 02, 1961 (59 y.o. M) Treating RN: Cornell Barman Primary Care Kiri Hinderliter: Merrie Roof Other Clinician: Referring Johnnisha Forton: Merrie Roof Treating Malayzia Laforte/Extender: Melburn Hake, HOYT Weeks in Treatment: 11 Visit Information History Since Last Visit All ordered tests and consults were completed: No Patient Arrived: Ambulatory Added or deleted any medications: No Arrival Time: 15:20 Any new allergies or adverse reactions: No Accompanied By: self Had a fall or experienced change in No Transfer Assistance: None activities of daily living that may affect Patient Identification Verified: Yes risk of falls: Secondary Verification Process Completed: Yes Signs or symptoms of abuse/neglect since last visito No Patient Requires Transmission-Based Precautions: No Hospitalized since last visit: No Patient Has Alerts: Yes Implantable device outside of the clinic excluding No Patient Alerts: Type II Diabetic cellular tissue based products placed in the center since last visit: Has Dressing in Place as Prescribed: Yes Pain Present Now: No Electronic Signature(s) Signed: 08/25/2020 3:33:49 PM By: Darci Needle Entered By: Darci Needle on 08/25/2020 15:21:09 Kisiel, Kazumi ([id number]) -------------------------------------------------------------------------------- Clinic Level of Care Assessment Details Patient Name: [name] Date of Service: 08/25/2020 3:15 PM Medical Record Number: [id number] Patient Account Number: [id number] Date of Birth/Sex: 07-03-1961 (59 y.o. M) Treating RN: Grover Canavan Primary Care Nechelle Petrizzo: Merrie Roof Other Clinician: Referring Austina Constantin: Merrie Roof Treating Porscha Axley/Extender: Melburn Hake, HOYT Weeks in Treatment:  11 Clinic Level of Care Assessment Items TOOL 4 Quantity Score _0  - Use when only an EandM is performed on FOLLOW-UP visit 0 ASSESSMENTS - Nursing Assessment / Reassessment X - Reassessment of Co-morbidities (includes updates in patient status) 1 10 X- 1 5 Reassessment of Adherence to Treatment Plan ASSESSMENTS - Wound and Skin Assessment / Reassessment X - Simple Wound Assessment / Reassessment - one wound 1 5 _1  - 0 Complex Wound Assessment / Reassessment - multiple wounds _2  - 0 Dermatologic / Skin Assessment (not related to wound area) ASSESSMENTS - Focused Assessment _3  - Circumferential Edema Measurements - multi extremities 0 _4  - 0 Nutritional Assessment / Counseling / Intervention X- 1 5 Lower Extremity Assessment (monofilament, tuning fork, pulses) _5  - 0 Peripheral Arterial Disease Assessment (using hand held doppler) ASSESSMENTS - Ostomy and/or Continence Assessment and Care _6  - Incontinence Assessment and Management 0 _7  - 0 Ostomy Care Assessment and Management (repouching, etc.) PROCESS - Coordination of Care X - Simple Patient / Family Education for ongoing care 1 15 _8  - 0 Complex (extensive) Patient / Family Education for ongoing care _9  - 0 Staff obtains Programmer, systems, Records, Test Results / Process Orders _10  - 0 Staff telephones HHA, Nursing Homes / Clarify orders / etc _11  - 0 Routine Transfer to another Facility (non-emergent condition) _12  - 0 Routine Hospital Admission (non-emergent condition) _13  - 0 New Admissions / Biomedical engineer / Ordering NPWT, Apligraf, etc. _14  - 0 Emergency Hospital Admission (emergent condition) X- 1 10 Simple Discharge Coordination _15  - 0 Complex (extensive) Discharge Coordination PROCESS - Special Needs _16  - Pediatric / Minor Patient Management 0 _17  - 0 Isolation Patient Management _18  - 0 Hearing / Language / Visual special needs _19  - 0 Assessment of Community assistance (transportation, D/C planning,  etc.) _20  - 0 Additional assistance / Altered mentation _21  - 0 Support Surface(s) Assessment (bed, cushion, seat, etc.) INTERVENTIONS - Wound Cleansing / Measurement Dimitri, Darrly ([id number]) _22  - 0 Simple Wound Cleansing -  one wound X- 2 5 Complex Wound Cleansing - multiple wounds X- 1 5 Wound Imaging (photographs - any number of wounds) _0  - 0 Wound Tracing (instead of photographs) X- 1 5 Simple Wound Measurement - one wound _1  - 0 Complex Wound Measurement - multiple wounds INTERVENTIONS - Wound Dressings X - Small Wound Dressing one or multiple wounds 2 10 _2  - 0 Medium Wound Dressing one or multiple wounds _3  - 0 Large Wound Dressing one or multiple wounds <VOJJKKXFGHWEXHBZ>_1<\/IRCVELFYBOFBPZWC>_5  - 0 Application of Medications - topical <ENIDPOEUMPNTIRWE>_3<\/XVQMGQQPYPPJKDTO>_6  - 0 Application of Medications - injection INTERVENTIONS - Miscellaneous _6  - External ear exam 0 _7  - 0 Specimen Collection (cultures, biopsies, blood, body fluids, etc.) _8  - 0 Specimen(s) / Culture(s) sent or taken to Lab for analysis _9  - 0 Patient Transfer (multiple staff / Civil Service fast streamer / Similar devices) _10  - 0 Simple Staple / Suture removal (25 or less) _11  - 0 Complex Staple / Suture removal (26 or more) _12  - 0 Hypo / Hyperglycemic Management (close monitor of Blood Glucose) _13  - 0 Ankle / Brachial Index (ABI) - do not check if billed separately X- 1 5 Vital Signs Has the patient been seen at the hospital within the last three years: Yes Total Score: 95 Level Of Care: New/Established - Level 3 Electronic Signature(s) Signed: 08/25/2020 4:34:46 PM By: Grover Canavan Entered By: Grover Canavan on 08/25/2020 15:43:31 Otte, Tenoch ([id number]) -------------------------------------------------------------------------------- [name] Date of Service: 08/25/2020 3:15 PM Medical Record Number: [id number] Patient Account Number: [id number] Date of Birth/Sex: [birth year] (59 y.o. M) Treating RN:  Grover Canavan Primary Care Miamor Ayler: Merrie Roof Other Clinician: Referring Janzen Sacks: Merrie Roof Treating Demarkus Remmel/Extender: Melburn Hake, HOYT Weeks in Treatment: 11 Encounter Discharge Information Items Discharge Condition: Stable Ambulatory Status: Ambulatory Discharge Destination: Home Transportation: Private Auto Accompanied By: self Schedule Follow-up Appointment: Yes Clinical Summary of Care: Electronic Signature(s) Signed: 08/25/2020 4:34:46 PM By: Grover Canavan Entered By: Grover Canavan on 08/25/2020 15:38:38 Cantrall, Pistol ([id number]) -------------------------------------------------------------------------------- Lower Extremity Assessment Details Patient Name: [name] Date of Service: 08/25/2020 3:15 PM Medical Record Number: [id number] Patient Account Number: [id number] Date of Birth/Sex: 02-May-1961 (59 y.o. M) Treating RN: Cornell Barman Primary Care Arris Meyn: Merrie Roof Other Clinician: Referring Jachin Coury: Merrie Roof Treating Amna Welker/Extender: Melburn Hake, HOYT Weeks in Treatment: 11 Edema Assessment Assessed: [Left: Yes] [Right: Yes] Edema: [Left: Yes] [Right: Yes] Calf Left: Right: Point of Measurement: 35 cm From Medial Instep 35.2 cm 37.5 cm Ankle Left: Right: Point of Measurement: 12 cm From Medial Instep 25 cm 24 cm Vascular Assessment Pulses: Dorsalis Pedis Palpable: [Left:Yes] [Right:Yes] Posterior Tibial Palpable: [Left:Yes] [Right:Yes] Electronic Signature(s) Signed: 08/25/2020 3:33:49 PM By: Darci Needle Signed: 09/29/2020 6:09:58 PM By: Gretta Cool, BSN, RN, CWS, Kim RN, BSN Entered By: Darci Needle on 08/25/2020 15:26:58 Heidel, Quadry ([id number]) -------------------------------------------------------------------------------- Multi Wound Chart Details Patient Name: [name] Date of Service: 08/25/2020 3:15 PM Medical Record Number: [id number] Patient Account Number: [id number] Date of Birth/Sex: Aug 21, 1961 (59 y.o.  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 Secondary Etiology: Diabetic Wound/Ulcer of the Lower N/A N/A Extremity Comorbid History: Cataracts,  Type II Diabetes, Cataracts, Type II Diabetes, N/A Neuropathy Neuropathy Date Acquired: 05/17/2020 05/07/2020 N/A Weeks of Treatment: 11 11 N/A Wound Status: Open Open N/A Measurements L x W x D (cm) 0.5x0.5x0.7 1.2x1.2x0.5 N/A Area (cm) : 0.196 1.131 N/A Volume (cm) : 0.137 0.565 N/A % Reduction in Area: 16.90% 12.70% N/A % Reduction in Volume: -93.00% -118.10% N/A Classification: Full Thickness Without Exposed Grade 1 N/A Support Structures Exudate Amount: Medium Medium N/A Exudate Type: Serosanguineous Serous N/A Exudate Color: red, brown amber N/A Wound Margin: Flat and Intact Epibole N/A Granulation Amount: Large (67-100%) None Present (0%) N/A Granulation Quality: Pink N/A N/A Necrotic Amount: None Present (0%) Large (67-100%) N/A Exposed Structures: Fat Layer (Subcutaneous Tissue): Fat Layer (Subcutaneous Tissue): N/A Yes Yes Fascia: No Fascia: No Tendon: No Tendon: No Muscle: No Muscle: No Joint: No Joint: No Bone: No Bone: No Epithelialization: Small (1-33%) None N/A Treatment Notes Electronic Signature(s) Signed: 08/25/2020 4:34:46 PM By: Grover Canavan Entered By: Grover Canavan on 08/25/2020 15:35:42 Maduro, Cowen ([id number]) Melone, Luisalberto  ([id number]) -------------------------------------------------------------------------------- Multi-Disciplinary Care Plan Details Patient Name: [name] Date of Service: 08/25/2020 3:15 PM Medical Record Number: [id number] Patient Account Number: [id number] Date of Birth/Sex: 01-29-1961 (58 y.o. M) Treating RN: Grover Canavan Primary Care Julea Hutto: Merrie Roof Other Clinician: Referring Emelda Kohlbeck: Merrie Roof Treating Kahliyah Dick/Extender: Melburn Hake, HOYT Weeks in Treatment: 11 Active Inactive Osteomyelitis Nursing Diagnoses: Potential for infection: osteomyelitis Goals: Diagnostic evaluation for osteomyelitis completed as ordered Date Initiated: 06/07/2020 Date Inactivated: 07/12/2020 Target Resolution Date: 07/06/2020 Goal Status: Met Patient/caregiver will verbalize understanding of disease process and disease management Date Initiated: 06/07/2020 Date Inactivated: 07/12/2020 Target Resolution Date: 07/06/2020 Goal Status: Met Patient's osteomyelitis will resolve Date Initiated: 06/07/2020 Target Resolution Date: 08/18/2020 Goal Status: Active Signs and symptoms for osteomyelitis will be recognized and promptly addressed Date Initiated: 06/07/2020 Date Inactivated: 07/12/2020 Target Resolution Date: 07/06/2020 Goal Status: Met Interventions: Assess for signs and symptoms of osteomyelitis resolution every visit Provide education on osteomyelitis Notes: Wound/Skin Impairment Nursing Diagnoses: Impaired tissue integrity Goals: Patient/caregiver will verbalize understanding of skin care regimen Date Initiated: 06/07/2020 Date Inactivated: 07/12/2020 Target Resolution Date: 07/06/2020 Goal Status: Met Ulcer/skin breakdown will have a volume reduction of 30% by week 4 Date Initiated: 06/07/2020 Date Inactivated: 07/12/2020 Target Resolution Date: 07/06/2020 Goal Status: Met Ulcer/skin breakdown will have a volume reduction of 50% by week 8 Date Initiated: 07/28/2020 Target Resolution  Date: 08/06/2020 Goal Status: Active Interventions: Assess patient/caregiver ability to obtain necessary supplies Assess ulceration(s) every visit Treatment Activities: Referred to DME Chrishaun Sasso for dressing supplies : 06/07/2020 Notes: Electronic Signature(s) Signed: 08/25/2020 4:34:46 PM By: Ellison Hughs, [place] ([id number]) Entered By: Grover Canavan on 08/25/2020 15:35:33 Driskill, Yesenia ([id number]) -------------------------------------------------------------------------------- Pain Assessment Details Patient Name: [name] Date of Service: 08/25/2020 3:15 PM Medical Record Number: [id number] Patient Account Number: [id number] Date of Birth/Sex: May 25, 1961 (59 y.o. M) Treating RN: Cornell Barman Primary Care Trask Vosler: Merrie Roof Other Clinician: Referring Desaray Marschner: Merrie Roof Treating Markeia Harkless/Extender: Melburn Hake, HOYT Weeks in Treatment: 11 Active Problems Location of Pain Severity and Description of Pain Patient Has Paino No Site Locations With Dressing Change: No Pain Management and Medication Current Pain Management: Electronic Signature(s) Signed: 08/25/2020 3:33:49 PM By: Darci Needle Signed: 09/29/2020 6:09:58 PM By: Gretta Cool, BSN, RN, CWS, Kim RN, BSN Entered By: Darci Needle on 08/25/2020 15:22:10 Blau, Jassiel ([id number]) -------------------------------------------------------------------------------- Patient/Caregiver Education Details Patient Name: [name] Date of Service: 08/25/2020 3:15 PM Medical Record Number: [id number] Patient Account Number: [id number] Date of Birth/Gender: [birth year] (  59 y.o. M) Treating RN: Grover Canavan Primary Care Physician: Merrie Roof Other Clinician: Referring Physician: Merrie Roof Treating Physician/Extender: Sharalyn Ink in Treatment: 11 Education Assessment Education Provided To: Patient Education Topics Provided Wound/Skin Impairment: Handouts: Caring for Your Ulcer Responses:  State content correctly Electronic Signature(s) Signed: 08/25/2020 4:34:46 PM By: Grover Canavan Entered By: Grover Canavan on 08/25/2020 15:35:56 Serna, Afshin ([id number]) -------------------------------------------------------------------------------- Wound Assessment Details Patient Name: [name] Date of Service: 08/25/2020 3:15 PM Medical Record Number: [id number] Patient Account Number: [id number] Date of Birth/Sex: Oct 21, 1961 (59 y.o. M) Treating RN: Cornell Barman Primary Care Tonnia Bardin: Merrie Roof Other Clinician: Referring Carsen Leaf: Merrie Roof Treating Zi Newbury/Extender: Melburn Hake, HOYT Weeks in Treatment: 11 Wound Status Wound Number: 1 Primary Etiology: Trauma, Other Wound Location: Right, Plantar Foot Secondary Etiology: Diabetic Wound/Ulcer of the Lower Extremity Wounding Event: Trauma Wound Status: Open Date Acquired: 05/17/2020 Comorbid History: Cataracts, Type II Diabetes, Neuropathy Weeks Of Treatment: 11 Clustered Wound: No Photos Wound Measurements Length: (cm) 0.5 Width: (cm) 0.5 Depth: (cm) 0.7 Area: (cm) 0.196 Volume: (cm) 0.137 % Reduction in Area: 16.9% % Reduction in Volume: -93% Epithelialization: Small (1-33%) Wound Description Classification: Full Thickness Without Exposed Support Structu Wound Margin: Flat and Intact Exudate Amount: Medium Exudate Type: Serosanguineous Exudate Color: red, brown res Foul Odor After Cleansing: No Slough/Fibrino No Wound Bed Granulation Amount: Large (67-100%) Exposed Structure Granulation Quality: Pink Fascia Exposed: No Necrotic Amount: None Present (0%) Fat Layer (Subcutaneous Tissue) Exposed: Yes Tendon Exposed: No Muscle Exposed: No Joint Exposed: No Bone Exposed: No Electronic Signature(s) Signed: 08/25/2020 3:33:49 PM By: Darci Needle Signed: 09/29/2020 6:09:58 PM By: Gretta Cool, BSN, RN, CWS, Kim RN, BSN Entered By: Darci Needle on 08/25/2020 15:23:19 Haydon, Sivan  ([id number]) -------------------------------------------------------------------------------- Wound Assessment Details Patient Name: [name] Date of Service: 08/25/2020 3:15 PM Medical Record Number: [id number] Patient Account Number: [id number] Date of Birth/Sex: 04/09/1961 (59 y.o. M) Treating RN: Cornell Barman Primary Care Kurstin Dimarzo: Merrie Roof Other Clinician: Referring Brittan Mapel: Merrie Roof Treating Kennesha Brewbaker/Extender: Melburn Hake, HOYT Weeks in Treatment: 11 Wound Status Wound Number: 2 Primary Etiology: Diabetic Wound/Ulcer of the Lower Extremity Wound Location: Left, Anterior Toe Great Wound Status: Open Wounding Event: Gradually Appeared Comorbid History: Cataracts, Type II Diabetes, Neuropathy Date Acquired: 05/07/2020 Weeks Of Treatment: 11 Clustered Wound: No Photos Wound Measurements Length: (cm) 1.2 Width: (cm) 1.2 Depth: (cm) 0.5 Area: (cm) 1.131 Volume: (cm) 0.565 % Reduction in Area: 12.7% % Reduction in Volume: -118.1% Epithelialization: None Wound Description Classification: Grade 1 Wound Margin: Epibole Exudate Amount: Medium Exudate Type: Serous Exudate Color: amber Foul Odor After Cleansing: No Slough/Fibrino Yes Wound Bed Granulation Amount: None Present (0%) Exposed Structure Necrotic Amount: Large (67-100%) Fascia Exposed: No Necrotic Quality: Adherent Slough Fat Layer (Subcutaneous Tissue) Exposed: Yes Tendon Exposed: No Muscle Exposed: No Joint Exposed: No Bone Exposed: No Electronic Signature(s) Signed: 08/25/2020 3:33:49 PM By: Darci Needle Signed: 09/29/2020 6:09:58 PM By: Gretta Cool, BSN, RN, CWS, Kim RN, BSN Entered By: Darci Needle on 08/25/2020 15:23:37 Condon, Kervens ([id number]) -------------------------------------------------------------------------------- [place] Details Patient Name: [name] Date of Service: 08/25/2020 3:15 PM Medical Record Number: [id number] Patient Account Number: [id number] Date of  Birth/Sex: [birth year] (59 y.o. M) Treating RN: Cornell Barman Primary Care Tyannah Sane: Merrie Roof Other Clinician: Referring Jovani Flury: Merrie Roof Treating Camaryn Lumbert/Extender: Melburn Hake, HOYT Weeks in Treatment: 11 Vital Signs Time Taken: 03:00 Temperature (F): 98.0 Height (in): 72 Pulse (bpm): 80 Weight (lbs): 249 Respiratory Rate (breaths/min): 18 Body Mass Index (BMI): 33.8 Blood Pressure (mmHg): 146/68 Reference  Range: 80 - 120 mg / dl Electronic Signature(s) Signed: 08/25/2020 3:33:49 PM By: Darci Needle Entered By: Darci Needle on 08/25/2020 15:22:02

## 2020-10-04 NOTE — Progress Notes (Addendum)
[name], [name] ([id number]) Visit Report for 09/30/2020 Arrival Information Details Patient Name: [name], [name] Date of Service: 09/30/2020 3:30 PM Medical Record Number: [id number] Patient Account Number: [id number] Date of Birth/Sex: May 19, 1961 (59 y.o. M) Treating RN: Cornell Barman Primary Care Kyshon Tolliver: Merrie Roof Other Clinician: Referring Beverlyann Broxterman: Merrie Roof Treating Zohair Epp/Extender: Skipper Cliche in Treatment: 73 Visit Information History Since Last Visit Added or deleted any medications: Yes Patient Arrived: Ambulatory Any new allergies or adverse reactions: No Arrival Time: 15:30 Had a fall or experienced change in No Accompanied By: self activities of daily living that may affect Transfer Assistance: None risk of falls: Patient Identification Verified: Yes Signs or symptoms of abuse/neglect since last visito No Secondary Verification Process Completed: Yes Hospitalized since last visit: No Patient Requires Transmission-Based Precautions: No Implantable device outside of the clinic excluding No Patient Has Alerts: Yes cellular tissue based products placed in the center Patient Alerts: Type II Diabetic since last visit: Has Dressing in Place as Prescribed: Yes Pain Present Now: Yes Electronic Signature(s) Signed: 09/30/2020 3:51:42 PM By: Lorine Bears RCP, RRT, CHT Entered By: Lorine Bears on 09/30/2020 15:31:50 [name], [name] ([id number]) -------------------------------------------------------------------------------- Clinic Level of Care Assessment Details Patient Name: [name], [name] Date of Service: 09/30/2020 3:30 PM Medical Record Number: [id number] Patient Account Number: [id number] Date of Birth/Sex: 1961-02-20 (59 y.o. M) Treating RN: Cornell Barman Primary Care Jeven Topper: Merrie Roof Other Clinician: Referring Thaddius Manes: Merrie Roof Treating Fredie Majano/Extender: Skipper Cliche in Treatment: 16 Clinic Level of Care  Assessment Items TOOL 4 Quantity Score _0  - Use when only an EandM is performed on FOLLOW-UP visit 0 ASSESSMENTS - Nursing Assessment / Reassessment X - Reassessment of Co-morbidities (includes updates in patient status) 1 10 X- 1 5 Reassessment of Adherence to Treatment Plan ASSESSMENTS - Wound and Skin Assessment / Reassessment _1  - Simple Wound Assessment / Reassessment - one wound 0 X- 2 5 Complex Wound Assessment / Reassessment - multiple wounds _2  - 0 Dermatologic / Skin Assessment (not related to wound area) ASSESSMENTS - Focused Assessment _3  - Circumferential Edema Measurements - multi extremities 0 _4  - 0 Nutritional Assessment / Counseling / Intervention _5  - 0 Lower Extremity Assessment (monofilament, tuning fork, pulses) _6  - 0 Peripheral Arterial Disease Assessment (using hand held doppler) ASSESSMENTS - Ostomy and/or Continence Assessment and Care _7  - Incontinence Assessment and Management 0 _8  - 0 Ostomy Care Assessment and Management (repouching, etc.) PROCESS - Coordination of Care X - Simple Patient / Family Education for ongoing care 1 15 _9  - 0 Complex (extensive) Patient / Family Education for ongoing care _10  - 0 Staff obtains Programmer, systems, Records, Test Results / Process Orders _11  - 0 Staff telephones HHA, Nursing Homes / Clarify orders / etc _12  - 0 Routine Transfer to another Facility (non-emergent condition) _13  - 0 Routine Hospital Admission (non-emergent condition) _14  - 0 New Admissions / Biomedical engineer / Ordering NPWT, Apligraf, etc. _15  - 0 Emergency Hospital Admission (emergent condition) X- 1 10 Simple Discharge Coordination _16  - 0 Complex (extensive) Discharge Coordination PROCESS - Special Needs _17  - Pediatric / Minor Patient Management 0 _18  - 0 Isolation Patient Management _19  - 0 Hearing / Language / Visual special needs _20  - 0 Assessment of Community assistance (transportation, D/C planning, etc.) _21  - 0 Additional  assistance / Altered mentation _22  - 0 Support Surface(s) Assessment (bed, cushion, seat, etc.) INTERVENTIONS - Wound Cleansing / Measurement [name], [name] ([id number]) _23  - 0 Simple Wound Cleansing - one wound X- 2 5 Complex  Wound Cleansing - multiple wounds X- 1 5 Wound Imaging (photographs - any number of wounds) _0  - 0 Wound Tracing (instead of photographs) _1  - 0 Simple Wound Measurement - one wound X- 2 5 Complex Wound Measurement - multiple wounds INTERVENTIONS - Wound Dressings _2  - Small Wound Dressing one or multiple wounds 0 X- 2 15 Medium Wound Dressing one or multiple wounds _3  - 0 Large Wound Dressing one or multiple wounds <NGEXBMWUXLKGMWNU>_2<\/VOZDGUYQIHKVQQVZ>_5  - 0 Application of Medications - topical <GLOVFIEPPIRJJOAC>_1<\/YSAYTKZSWFUXNATF>_5  - 0 Application of Medications - injection INTERVENTIONS - Miscellaneous _6  - External ear exam 0 _7  - 0 Specimen Collection (cultures, biopsies, blood, body fluids, etc.) _8  - 0 Specimen(s) / Culture(s) sent or taken to Lab for analysis _9  - 0 Patient Transfer (multiple staff / Civil Service fast streamer / Similar devices) _10  - 0 Simple Staple / Suture removal (25 or less) _11  - 0 Complex Staple / Suture removal (26 or more) _12  - 0 Hypo / Hyperglycemic Management (close monitor of Blood Glucose) _13  - 0 Ankle / Brachial Index (ABI) - do not check if billed separately X- 1 5 Vital Signs Has the patient been seen at the hospital within the last three years: Yes Total Score: 110 Level Of Care: New/Established - Level 3 Electronic Signature(s) Signed: 10/03/2020 6:06:12 PM By: Gretta Cool, BSN, RN, CWS, Kim RN, BSN Entered By: Gretta Cool, BSN, RN, CWS, Kim on 09/30/2020 16:45:38 Solomon, Jafet ([id number]) -------------------------------------------------------------------------------- Complex / Palliative Patient Assessment Details Patient Name: [name] Date of Service: 09/30/2020 3:30 PM Medical Record Number: [id number] Patient Account Number: [id number] Date of Birth/Sex: 10-21-1961 (59 y.o.  M) Treating RN: Cornell Barman Primary Care Ameshia Pewitt: Merrie Roof Other Clinician: Referring Male Minish: Merrie Roof Treating Marialuiza Car/Extender: Skipper Cliche in Treatment: 16 Palliative Management Criteria Complex Wound Management Criteria Patient is unable to adhere to an advanced wound management plan. o Patient Care Contract has been implemented. Patient demonstrates continued inability to adhere to an advanced wound management plan, the patient has been presented with the option of continuing the advanced plan or implementing the Complex Wound Management Plan. o Patient or healthcare surrogate must agree to Complex Wound Management Plan. Care Approach Wound Care Plan: Complex Wound Management Notes Patient really needs a total contact cast and to be off of his feet to heal. However, he continues to work and wear steal toe shoes which is exacerbating and preventing healing Electronic Signature(s) Signed: 10/07/2020 9:32:11 AM By: Worthy Keeler PA-C Entered By: Worthy Keeler on 10/07/2020 09:32:11 Lamoreaux, Shaquel ([id number]) -------------------------------------------------------------------------------- [name] Date of Service: 09/30/2020 3:30 PM Medical Record Number: [id number] Patient Account Number: [id number] Date of Birth/Sex: May 22, 1961 (59 y.o. M) Treating RN: Cornell Barman Primary Care Jobina Maita: Merrie Roof Other Clinician: Referring Jacqualynn Parco: Merrie Roof Treating Emmelyn Schmale/Extender: Skipper Cliche in Treatment: 16 Encounter Discharge Information Items Discharge Condition: Stable Ambulatory Status: Ambulatory Discharge Destination: Home Transportation: Private Auto Accompanied By: self Schedule Follow-up Appointment: Yes Clinical Summary of Care: Electronic Signature(s) Signed: 09/30/2020 4:47:10 PM By: Gretta Cool, BSN, RN, CWS, Kim RN, BSN Entered By: Gretta Cool, BSN, RN, CWS, Kim on 09/30/2020  16:47:10 Crupi, Ulysses ([id number]) -------------------------------------------------------------------------------- Lower Extremity Assessment Details Patient Name: [name] Date of Service: 09/30/2020 3:30 PM Medical Record Number: [id number] Patient Account Number: [id number] Date of Birth/Sex: 1961/02/04 (59 y.o. M) Treating RN: Cornell Barman Primary Care Rashaan Wyles: Merrie Roof Other Clinician: Referring Joice Nazario: Merrie Roof Treating Ebb Carelock/Extender: Jeri Cos Weeks in Treatment: 16 Edema Assessment Assessed: [Left: Yes] [Right: Yes] Edema: [  Left: No] [Right: No] Vascular Assessment Pulses: Dorsalis Pedis Palpable: [Left:Yes] [Right:Yes] Electronic Signature(s) Signed: 10/03/2020 6:06:12 PM By: Gretta Cool, BSN, RN, CWS, Kim RN, BSN Entered By: Gretta Cool, BSN, RN, CWS, Kim on 09/30/2020 15:38:48 Eble, Yeray ([id number]) -------------------------------------------------------------------------------- Multi Wound Chart Details Patient Name: [name] Date of Service: 09/30/2020 3:30 PM Medical Record Number: [id number] Patient Account Number: [id number] Date of Birth/Sex: [birth year] (59 y.o. 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 Secondary Etiology: Diabetic Wound/Ulcer of the Lower N/A N/A Extremity Comorbid History: Cataracts, Type II Diabetes, Cataracts, Type II Diabetes, N/A Neuropathy Neuropathy Date Acquired: 05/17/2020 05/07/2020 N/A Weeks of  Treatment: 16 16 N/A Wound Status: Open Open N/A Measurements L x W x D (cm) 0.5x0.5x0.5 0.2x0.3x0.4 N/A Area (cm) : 0.196 0.047 N/A Volume (cm) : 0.098 0.019 N/A % Reduction in Area: 16.90% 96.40% N/A % Reduction in Volume: -38.00% 92.70% N/A Classification: Full Thickness Without Exposed Grade 1 N/A Support Structures Exudate Amount: Medium Medium N/A Exudate Type: Serosanguineous Serous N/A Exudate Color: red, brown amber N/A Wound Margin: Flat and Intact Epibole N/A Granulation Amount: Large (67-100%) Small (1-33%) N/A Granulation Quality: Pink Pale N/A Necrotic Amount: None Present (0%) Large (67-100%) N/A Exposed Structures: Fat Layer (Subcutaneous Tissue): Fat Layer (Subcutaneous Tissue): N/A Yes Yes Fascia: No Fascia: No Tendon: No Tendon: No Muscle: No Muscle: No Joint: No Joint: No Bone: No Bone: No Epithelialization: Small (1-33%) None N/A Treatment Notes Electronic Signature(s) Signed: 10/03/2020 6:06:12 PM By: Gretta Cool, BSN, RN, CWS, Kim RN, BSN Entered By: Gretta Cool, BSN, RN, CWS, Kim on 09/30/2020 15:40:38 Albarracin, Bilaal ([id number]) EDELL, Ollis ([id number]) -------------------------------------------------------------------------------- Multi-Disciplinary Care Plan Details Patient Name: [name] Date of Service: 09/30/2020 3:30 PM Medical Record Number: [id number] Patient Account Number: [id number] Date of Birth/Sex: 1961-06-22 (59 y.o. M) Treating RN: Cornell Barman Primary Care Joan Herschberger: Merrie Roof Other Clinician: Referring Bhakti Labella: Merrie Roof Treating Tylor Courtwright/Extender: Skipper Cliche in Treatment: 16 Active Inactive Osteomyelitis Nursing Diagnoses: Potential for infection: osteomyelitis Goals: Diagnostic evaluation for osteomyelitis completed as ordered Date Initiated: 06/07/2020 Date Inactivated: 07/12/2020 Target Resolution Date: 07/06/2020 Goal Status: Met Patient/caregiver will verbalize understanding of disease process and disease  management Date Initiated: 06/07/2020 Date Inactivated: 07/12/2020 Target Resolution Date: 07/06/2020 Goal Status: Met Patient's osteomyelitis will resolve Date Initiated: 06/07/2020 Target Resolution Date: 08/18/2020 Goal Status: Active Signs and symptoms for osteomyelitis will be recognized and promptly addressed Date Initiated: 06/07/2020 Date Inactivated: 07/12/2020 Target Resolution Date: 07/06/2020 Goal Status: Met Interventions: Assess for signs and symptoms of osteomyelitis resolution every visit Provide education on osteomyelitis Notes: Wound/Skin Impairment Nursing Diagnoses: Impaired tissue integrity Goals: Patient/caregiver will verbalize understanding of skin care regimen Date Initiated: 06/07/2020 Date Inactivated: 07/12/2020 Target Resolution Date: 07/06/2020 Goal Status: Met Ulcer/skin breakdown will have a volume reduction of 30% by week 4 Date Initiated: 06/07/2020 Date Inactivated: 07/12/2020 Target Resolution Date: 07/06/2020 Goal Status: Met Ulcer/skin breakdown will have a volume reduction of 50% by week 8 Date Initiated: 07/28/2020 Date Inactivated: 09/30/2020 Target Resolution Date: 08/06/2020 Goal Status: Unmet Unmet Reason: DM Ulcer/skin breakdown will have a volume reduction of 80% by week 12 Date Initiated: 09/30/2020 Date Inactivated:  09/30/2020 Target Resolution Date: 09/06/2020 Goal Status: Unmet Unmet Reason: DM Ulcer/skin breakdown will heal within 14 weeks Date Initiated: 09/30/2020 Target Resolution Date: 09/09/2020 Goal Status: Active Interventions: Assess patient/caregiver ability to obtain necessary supplies Assess ulceration(s) every visit Treatment Activities: Referred to DME Zuri Lascala for dressing supplies : 06/07/2020 FARIES, Siler ([id number]) Notes: Electronic Signature(s) Signed: 10/03/2020 6:06:12 PM By: Gretta Cool, BSN, RN, CWS, Kim RN, BSN Entered By: Gretta Cool, BSN, RN, CWS, Kim on 09/30/2020 15:40:31 Shabazz, Kilan  ([id number]) -------------------------------------------------------------------------------- Pain Assessment Details Patient Name: [name] Date of Service: 09/30/2020 3:30 PM Medical Record Number: [id number] Patient Account Number: [id number] Date of Birth/Sex: 06-27-1961 (59 y.o. M) Treating RN: Cornell Barman Primary Care Deondrick Searls: Merrie Roof Other Clinician: Referring Tonia Avino: Merrie Roof Treating Grantley Savage/Extender: Skipper Cliche in Treatment: 16 Active Problems Location of Pain Severity and Description of Pain Patient Has Paino Yes Site Locations Pain Location: Generalized Pain Rate the pain. Current Pain Level: 7 Pain Management and Medication Current Pain Management: Notes Pain in bunion, not wound. Electronic Signature(s) Signed: 10/03/2020 6:06:12 PM By: Gretta Cool, BSN, RN, CWS, Kim RN, BSN Entered By: Gretta Cool, BSN, RN, CWS, Kim on 09/30/2020 15:34:26 Anello, Kelvin ([id number]) -------------------------------------------------------------------------------- Patient/Caregiver Education Details Patient Name: [name] Date of Service: 09/30/2020 3:30 PM Medical Record Number: [id number] Patient Account Number: [id number] Date of Birth/Gender: [birth year] (59 y.o. M) Treating RN: Cornell Barman Primary Care Physician: Merrie Roof Other Clinician: Referring Physician: Merrie Roof Treating Physician/Extender: Skipper Cliche in Treatment: 16 Education Assessment Education Provided To: Patient Education Topics Provided Wound Debridement: Handouts: Wound Debridement Methods: Demonstration, Explain/Verbal Responses: State content correctly Wound/Skin Impairment: Handouts: Caring for Your Ulcer, Other: continue wound care as prescribed Methods: Demonstration, Explain/Verbal Responses: State content correctly Electronic Signature(s) Signed: 10/03/2020 6:06:12 PM By: Gretta Cool, BSN, RN, CWS, Kim RN, BSN Entered By: Gretta Cool, BSN, RN, CWS, Kim on 09/30/2020  16:46:16 Luebke, Darrien ([id number]) -------------------------------------------------------------------------------- Wound Assessment Details Patient Name: [name] Date of Service: 09/30/2020 3:30 PM Medical Record Number: [id number] Patient Account Number: [id number] Date of Birth/Sex: 07/10/1961 (59 y.o. M) Treating RN: Cornell Barman Primary Care Mariko Nowakowski: Merrie Roof Other Clinician: Referring Karee Christopherson: Merrie Roof Treating Arnelle Nale/Extender: Skipper Cliche in Treatment: 16 Wound Status Wound Number: 1 Primary Etiology: Trauma, Other Wound Location: Right, Plantar Foot Secondary Etiology: Diabetic Wound/Ulcer of the Lower Extremity Wounding Event: Trauma Wound Status: Open Date Acquired: 05/17/2020 Comorbid History: Cataracts, Type II Diabetes, Neuropathy Weeks Of Treatment: 16 Clustered Wound: No Photos Wound Measurements Length: (cm) 0.5 Width: (cm) 0.5 Depth: (cm) 0.5 Area: (cm) 0.196 Volume: (cm) 0.098 % Reduction in Area: 16.9% % Reduction in Volume: -38% Epithelialization: Small (1-33%) Tunneling: No Undermining: No Wound Description Classification: Full Thickness Without Exposed Support Structu Wound Margin: Flat and Intact Exudate Amount: Medium Exudate Type: Serosanguineous Exudate Color: red, brown res Foul Odor After Cleansing: No Slough/Fibrino No Wound Bed Granulation Amount: Large (67-100%) Exposed Structure Granulation Quality: Pink Fascia Exposed: No Necrotic Amount: None Present (0%) Fat Layer (Subcutaneous Tissue) Exposed: Yes Tendon Exposed: No Muscle Exposed: No Joint Exposed: No Bone Exposed: No Electronic Signature(s) Signed: 10/03/2020 6:06:12 PM By: Gretta Cool, BSN, RN, CWS, Kim RN, BSN Entered By: Gretta Cool, BSN, RN, CWS, Kim on 09/30/2020 15:36:01 Anstey, Geovannie ([id number]) -------------------------------------------------------------------------------- Wound Assessment Details Patient Name: [name] Date of Service:  09/30/2020 3:30 PM Medical Record Number: [id number] Patient Account Number: [id number] Date of Birth/Sex: [birth year] (59 y.o. M) Treating RN: Cornell Barman Primary Care Baudelio Karnes: Merrie Roof Other Clinician: Referring Kaarin Pardy: Merrie Roof Treating Magnolia Mattila/Extender: Jeri Cos  Weeks in Treatment: 16 Wound Status Wound Number: 2 Primary Etiology: Diabetic Wound/Ulcer of the Lower Extremity Wound Location: Left, Anterior Toe Great Wound Status: Open Wounding Event: Gradually Appeared Comorbid History: Cataracts, Type II Diabetes, Neuropathy Date Acquired: 05/07/2020 Weeks Of Treatment: 16 Clustered Wound: No Photos Wound Measurements Length: (cm) 0.2 Width: (cm) 0.3 Depth: (cm) 0.4 Area: (cm) 0.047 Volume: (cm) 0.019 % Reduction in Area: 96.4% % Reduction in Volume: 92.7% Epithelialization: None Tunneling: No Undermining: No Wound Description Classification: Grade 1 Wound Margin: Epibole Exudate Amount: Medium Exudate Type: Serous Exudate Color: amber Foul Odor After Cleansing: No Slough/Fibrino Yes Wound Bed Granulation Amount: Small (1-33%) Exposed Structure Granulation Quality: Pale Fascia Exposed: No Necrotic Amount: Large (67-100%) Fat Layer (Subcutaneous Tissue) Exposed: Yes Necrotic Quality: Adherent Slough Tendon Exposed: No Muscle Exposed: No Joint Exposed: No Bone Exposed: No Treatment Notes Wound #2 (Left, Anterior Toe Great) Notes scell, gauze, conform, tape (offloading felt) Electronic Signature(s) Signed: 10/03/2020 6:06:12 PM By: Gretta Cool, BSN, RN, CWS, Kim RN, BSN [place], Adil ([id number]) Entered By: Gretta Cool, BSN, RN, CWS, Kim on 09/30/2020 15:37:57 Jakel, Nycholas ([id number]) -------------------------------------------------------------------------------- Vitals Details Patient Name: [name] Date of Service: 09/30/2020 3:30 PM Medical Record Number: [id number] Patient Account Number: [id number] Date of Birth/Sex: 05-Feb-1961 (59 y.o.  M) Treating RN: Cornell Barman Primary Care Tayvia Faughnan: Merrie Roof Other Clinician: Referring Caydin Yeatts: Merrie Roof Treating Nycole Kawahara/Extender: Skipper Cliche in Treatment: 16 Vital Signs Time Taken: 15:30 Temperature (F): 98.1 Height (in): 72 Pulse (bpm): 93 Weight (lbs): 249 Respiratory Rate (breaths/min): 18 Body Mass Index (BMI): 33.8 Blood Pressure (mmHg): 159/82 Reference Range: 80 - 120 mg / dl Electronic Signature(s) Signed: 09/30/2020 3:51:42 PM By: Lorine Bears RCP, RRT, CHT Entered By: Lorine Bears on 09/30/2020 15:32:36

## 2020-10-05 ENCOUNTER — Ambulatory Visit (INDEPENDENT_AMBULATORY_CARE_PROVIDER_SITE_OTHER)
Admission: EM | Admit: 2020-10-05 | Discharge: 2020-10-05 | Disposition: A | Discharge status: Other Acute Inpatient Hospital | Payer: BC Managed Care – PPO | Source: Home / Self Care

## 2020-10-05 ENCOUNTER — Ambulatory Visit (INDEPENDENT_AMBULATORY_CARE_PROVIDER_SITE_OTHER): Payer: BC Managed Care – PPO

## 2020-10-05 ENCOUNTER — Other Ambulatory Visit: Payer: Self-pay

## 2020-10-05 ENCOUNTER — Inpatient Hospital Stay
Admission: EM | Admit: 2020-10-05 | Discharge: 2020-10-10 | DRG: 854 | Disposition: A | Discharge status: Home Health | Payer: BC Managed Care – PPO | Attending: Internal Medicine | Admitting: Internal Medicine

## 2020-10-05 ENCOUNTER — Encounter: Payer: Self-pay | Admitting: Emergency Medicine

## 2020-10-05 ENCOUNTER — Inpatient Hospital Stay: Payer: BC Managed Care – PPO

## 2020-10-05 DIAGNOSIS — E860 Dehydration: Secondary | ICD-10-CM | POA: Diagnosis present

## 2020-10-05 DIAGNOSIS — Z7982 Long term (current) use of aspirin: Secondary | ICD-10-CM

## 2020-10-05 DIAGNOSIS — E871 Hypo-osmolality and hyponatremia: Secondary | ICD-10-CM | POA: Diagnosis not present

## 2020-10-05 DIAGNOSIS — D649 Anemia, unspecified: Secondary | ICD-10-CM | POA: Diagnosis present

## 2020-10-05 DIAGNOSIS — Z880 Allergy status to penicillin: Secondary | ICD-10-CM

## 2020-10-05 DIAGNOSIS — K219 Gastro-esophageal reflux disease without esophagitis: Secondary | ICD-10-CM | POA: Diagnosis present

## 2020-10-05 DIAGNOSIS — Z794 Long term (current) use of insulin: Secondary | ICD-10-CM

## 2020-10-05 DIAGNOSIS — Z8616 Personal history of COVID-19: Secondary | ICD-10-CM | POA: Diagnosis not present

## 2020-10-05 DIAGNOSIS — E1152 Type 2 diabetes mellitus with diabetic peripheral angiopathy with gangrene: Secondary | ICD-10-CM | POA: Diagnosis present

## 2020-10-05 DIAGNOSIS — N19 Unspecified kidney failure: Secondary | ICD-10-CM | POA: Diagnosis present

## 2020-10-05 DIAGNOSIS — I739 Peripheral vascular disease, unspecified: Secondary | ICD-10-CM | POA: Diagnosis not present

## 2020-10-05 DIAGNOSIS — I96 Gangrene, not elsewhere classified: Secondary | ICD-10-CM | POA: Diagnosis present

## 2020-10-05 DIAGNOSIS — L02611 Cutaneous abscess of right foot: Secondary | ICD-10-CM | POA: Diagnosis present

## 2020-10-05 DIAGNOSIS — E669 Obesity, unspecified: Secondary | ICD-10-CM | POA: Diagnosis present

## 2020-10-05 DIAGNOSIS — L03115 Cellulitis of right lower limb: Secondary | ICD-10-CM | POA: Diagnosis not present

## 2020-10-05 DIAGNOSIS — A411 Sepsis due to other specified staphylococcus: Principal | ICD-10-CM | POA: Diagnosis present

## 2020-10-05 DIAGNOSIS — E1165 Type 2 diabetes mellitus with hyperglycemia: Secondary | ICD-10-CM | POA: Diagnosis not present

## 2020-10-05 DIAGNOSIS — M19071 Primary osteoarthritis, right ankle and foot: Secondary | ICD-10-CM | POA: Diagnosis not present

## 2020-10-05 DIAGNOSIS — Z09 Encounter for follow-up examination after completed treatment for conditions other than malignant neoplasm: Secondary | ICD-10-CM

## 2020-10-05 DIAGNOSIS — E1142 Type 2 diabetes mellitus with diabetic polyneuropathy: Secondary | ICD-10-CM | POA: Diagnosis not present

## 2020-10-05 DIAGNOSIS — L97519 Non-pressure chronic ulcer of other part of right foot with unspecified severity: Secondary | ICD-10-CM

## 2020-10-05 DIAGNOSIS — M86171 Other acute osteomyelitis, right ankle and foot: Secondary | ICD-10-CM | POA: Diagnosis not present

## 2020-10-05 DIAGNOSIS — M7731 Calcaneal spur, right foot: Secondary | ICD-10-CM | POA: Diagnosis not present

## 2020-10-05 DIAGNOSIS — E7800 Pure hypercholesterolemia, unspecified: Secondary | ICD-10-CM | POA: Diagnosis present

## 2020-10-05 DIAGNOSIS — L089 Local infection of the skin and subcutaneous tissue, unspecified: Secondary | ICD-10-CM

## 2020-10-05 DIAGNOSIS — E785 Hyperlipidemia, unspecified: Secondary | ICD-10-CM | POA: Diagnosis not present

## 2020-10-05 DIAGNOSIS — S91301A Unspecified open wound, right foot, initial encounter: Secondary | ICD-10-CM | POA: Diagnosis not present

## 2020-10-05 DIAGNOSIS — T148XXA Other injury of unspecified body region, initial encounter: Secondary | ICD-10-CM | POA: Diagnosis not present

## 2020-10-05 DIAGNOSIS — Z6833 Body mass index (BMI) 33.0-33.9, adult: Secondary | ICD-10-CM | POA: Diagnosis not present

## 2020-10-05 DIAGNOSIS — E1140 Type 2 diabetes mellitus with diabetic neuropathy, unspecified: Secondary | ICD-10-CM | POA: Diagnosis not present

## 2020-10-05 DIAGNOSIS — E11621 Type 2 diabetes mellitus with foot ulcer: Secondary | ICD-10-CM | POA: Diagnosis present

## 2020-10-05 DIAGNOSIS — Q6689 Other  specified congenital deformities of feet: Secondary | ICD-10-CM | POA: Diagnosis not present

## 2020-10-05 DIAGNOSIS — M869 Osteomyelitis, unspecified: Secondary | ICD-10-CM | POA: Diagnosis not present

## 2020-10-05 DIAGNOSIS — Z79899 Other long term (current) drug therapy: Secondary | ICD-10-CM | POA: Diagnosis not present

## 2020-10-05 DIAGNOSIS — A419 Sepsis, unspecified organism: Secondary | ICD-10-CM | POA: Diagnosis present

## 2020-10-05 DIAGNOSIS — Z89421 Acquired absence of other right toe(s): Secondary | ICD-10-CM | POA: Diagnosis not present

## 2020-10-05 DIAGNOSIS — E119 Type 2 diabetes mellitus without complications: Secondary | ICD-10-CM

## 2020-10-05 DIAGNOSIS — E1169 Type 2 diabetes mellitus with other specified complication: Secondary | ICD-10-CM | POA: Diagnosis not present

## 2020-10-05 DIAGNOSIS — Z9889 Other specified postprocedural states: Secondary | ICD-10-CM | POA: Diagnosis not present

## 2020-10-05 DIAGNOSIS — S98132A Complete traumatic amputation of one left lesser toe, initial encounter: Secondary | ICD-10-CM | POA: Diagnosis not present

## 2020-10-05 DIAGNOSIS — R000 Tachycardia, unspecified: Secondary | ICD-10-CM | POA: Diagnosis not present

## 2020-10-05 DIAGNOSIS — L97514 Non-pressure chronic ulcer of other part of right foot with necrosis of bone: Secondary | ICD-10-CM | POA: Diagnosis not present

## 2020-10-05 LAB — CBC WITH DIFFERENTIAL/PLATELET
Abs Immature Granulocytes: 0.09 10*3/uL — ABNORMAL HIGH (ref 0.00–0.07)
Basophils Absolute: 0 10*3/uL (ref 0.0–0.1)
Basophils Relative: 0 %
Eosinophils Absolute: 0 10*3/uL (ref 0.0–0.5)
Eosinophils Relative: 0 %
HCT: 35.3 % — ABNORMAL LOW (ref 39.0–52.0)
Hemoglobin: 12.4 g/dL — ABNORMAL LOW (ref 13.0–17.0)
Immature Granulocytes: 1 %
Lymphocytes Relative: 7 %
Lymphs Abs: 1.1 10*3/uL (ref 0.7–4.0)
MCH: 32.2 pg (ref 26.0–34.0)
MCHC: 35.1 g/dL (ref 30.0–36.0)
MCV: 91.7 fL (ref 80.0–100.0)
Monocytes Absolute: 0.8 10*3/uL (ref 0.1–1.0)
Monocytes Relative: 5 %
Neutro Abs: 13.3 10*3/uL — ABNORMAL HIGH (ref 1.7–7.7)
Neutrophils Relative %: 87 %
Platelets: 212 10*3/uL (ref 150–400)
RBC: 3.85 MIL/uL — ABNORMAL LOW (ref 4.22–5.81)
RDW: 11.8 % (ref 11.5–15.5)
WBC: 15.3 10*3/uL — ABNORMAL HIGH (ref 4.0–10.5)
nRBC: 0 % (ref 0.0–0.2)

## 2020-10-05 LAB — URINALYSIS, COMPLETE (UACMP) WITH MICROSCOPIC
Bacteria, UA: NONE SEEN
Bilirubin Urine: NEGATIVE
Glucose, UA: >=500 mg/dL — AB
Hgb urine dipstick: NEGATIVE
Ketones, ur: 5 mg/dL — AB
Leukocytes,Ua: NEGATIVE
Nitrite: NEGATIVE
Protein, ur: 100 mg/dL — AB
Specific Gravity, Urine: 1.02 (ref 1.005–1.030)
pH: 5 (ref 5.0–8.0)

## 2020-10-05 LAB — COMPREHENSIVE METABOLIC PANEL
ALT: 21 U/L (ref 0–44)
AST: 25 U/L (ref 15–41)
Albumin: 3.5 g/dL (ref 3.5–5.0)
Alkaline Phosphatase: 57 U/L (ref 38–126)
Anion gap: 12 (ref 5–15)
BUN: 32 mg/dL — ABNORMAL HIGH (ref 6–20)
CO2: 24 mmol/L (ref 22–32)
Calcium: 8.3 mg/dL — ABNORMAL LOW (ref 8.9–10.3)
Chloride: 95 mmol/L — ABNORMAL LOW (ref 98–111)
Creatinine, Ser: 1.06 mg/dL (ref 0.61–1.24)
GFR, Estimated: >60 mL/min (ref >60)
Glucose, Bld: 324 mg/dL — ABNORMAL HIGH (ref 70–99)
Potassium: 4 mmol/L (ref 3.5–5.1)
Sodium: 131 mmol/L — ABNORMAL LOW (ref 135–145)
Total Bilirubin: 1.2 mg/dL (ref 0.3–1.2)
Total Protein: 7.3 g/dL (ref 6.5–8.1)

## 2020-10-05 LAB — GLUCOSE, CAPILLARY: Glucose-Capillary: 241 mg/dL — ABNORMAL HIGH (ref 70–99)

## 2020-10-05 LAB — C-REACTIVE PROTEIN: CRP: 18.9 mg/dL — ABNORMAL HIGH (ref <1.0)

## 2020-10-05 LAB — LACTIC ACID, PLASMA: Lactic Acid, Venous: 1.4 mmol/L (ref 0.5–1.9)

## 2020-10-05 LAB — RESPIRATORY PANEL BY RT PCR (FLU A&B, COVID)
Influenza A by PCR: NEGATIVE
Influenza B by PCR: NEGATIVE
SARS Coronavirus 2 by RT PCR: NEGATIVE

## 2020-10-05 LAB — PROTIME-INR
INR: 1.3 — ABNORMAL HIGH (ref 0.8–1.2)
Prothrombin Time: 16.1 seconds — ABNORMAL HIGH (ref 11.4–15.2)

## 2020-10-05 LAB — CBG MONITORING, ED
Glucose-Capillary: 197 mg/dL — ABNORMAL HIGH (ref 70–99)
Glucose-Capillary: 214 mg/dL — ABNORMAL HIGH (ref 70–99)

## 2020-10-05 LAB — SEDIMENTATION RATE: Sed Rate: 87 mm/hr — ABNORMAL HIGH (ref 0–20)

## 2020-10-05 LAB — MAGNESIUM: Magnesium: 2 mg/dL (ref 1.7–2.4)

## 2020-10-05 MED ORDER — LACTATED RINGERS IV BOLUS
1000.0000 mL | Freq: Once | INTRAVENOUS | Status: AC
  Administered 2020-10-05: 1000 mL via INTRAVENOUS

## 2020-10-05 MED ORDER — MELATONIN 5 MG PO TABS
5.0000 mg | ORAL_TABLET | Freq: Every evening | ORAL | Status: DC | PRN
  Filled 2020-10-05: qty 1

## 2020-10-05 MED ORDER — VANCOMYCIN HCL 2000 MG/400ML IV SOLN
2000.0000 mg | Freq: Once | INTRAVENOUS | Status: AC
  Administered 2020-10-05: 2000 mg via INTRAVENOUS
  Filled 2020-10-05: qty 400

## 2020-10-05 MED ORDER — ACETAMINOPHEN 325 MG PO TABS
650.0000 mg | ORAL_TABLET | Freq: Four times a day (QID) | ORAL | Status: DC | PRN
  Administered 2020-10-06: 650 mg via ORAL
  Filled 2020-10-05: qty 2

## 2020-10-05 MED ORDER — INSULIN ASPART 100 UNIT/ML ~~LOC~~ SOLN
0.0000 [IU] | SUBCUTANEOUS | Status: DC
  Administered 2020-10-05: 3 [IU] via SUBCUTANEOUS
  Filled 2020-10-05: qty 1

## 2020-10-05 MED ORDER — INSULIN ASPART 100 UNIT/ML ~~LOC~~ SOLN
0.0000 [IU] | Freq: Three times a day (TID) | SUBCUTANEOUS | Status: DC
  Administered 2020-10-06: 17:00:00 5 [IU] via SUBCUTANEOUS
  Administered 2020-10-06: 1 [IU] via SUBCUTANEOUS
  Administered 2020-10-06: 3 [IU] via SUBCUTANEOUS
  Administered 2020-10-07 (×2): 2 [IU] via SUBCUTANEOUS
  Administered 2020-10-08 (×2): 7 [IU] via SUBCUTANEOUS
  Filled 2020-10-05 (×7): qty 1

## 2020-10-05 MED ORDER — ENOXAPARIN SODIUM 60 MG/0.6ML ~~LOC~~ SOLN
0.5000 mg/kg | SUBCUTANEOUS | Status: DC
  Administered 2020-10-06 – 2020-10-09 (×4): 55 mg via SUBCUTANEOUS
  Filled 2020-10-05 (×5): qty 0.6

## 2020-10-05 MED ORDER — ONDANSETRON HCL 4 MG/2ML IJ SOLN: 4.0000 mg | Freq: Four times a day (QID) | INTRAMUSCULAR | Status: DC | PRN

## 2020-10-05 MED ORDER — ACETAMINOPHEN 650 MG RE SUPP: 650.0000 mg | Freq: Four times a day (QID) | RECTAL | Status: DC | PRN

## 2020-10-05 MED ORDER — LACTATED RINGERS IV SOLN: INTRAVENOUS | Status: DC

## 2020-10-05 MED ORDER — VANCOMYCIN HCL 1750 MG/350ML IV SOLN: 1750.0000 mg | Freq: Once | INTRAVENOUS | Status: DC

## 2020-10-05 MED ORDER — ACETAMINOPHEN 500 MG PO TABS
1000.0000 mg | ORAL_TABLET | Freq: Once | ORAL | Status: AC
  Administered 2020-10-05: 1000 mg via ORAL
  Filled 2020-10-05: qty 2

## 2020-10-05 MED ORDER — ATORVASTATIN CALCIUM 20 MG PO TABS
20.0000 mg | ORAL_TABLET | ORAL | Status: DC
  Administered 2020-10-05 – 2020-10-09 (×3): 20 mg via ORAL
  Filled 2020-10-05 (×4): qty 1

## 2020-10-05 MED ORDER — ONDANSETRON HCL 4 MG PO TABS: 4.0000 mg | ORAL_TABLET | Freq: Four times a day (QID) | ORAL | Status: DC | PRN

## 2020-10-05 MED ORDER — FENTANYL CITRATE (PF) 100 MCG/2ML IJ SOLN
25.0000 ug | INTRAMUSCULAR | Status: DC | PRN
  Administered 2020-10-06 (×3): 25 ug via INTRAVENOUS
  Filled 2020-10-05 (×3): qty 2

## 2020-10-05 MED ORDER — VANCOMYCIN HCL IN DEXTROSE 1-5 GM/200ML-% IV SOLN
1000.0000 mg | Freq: Two times a day (BID) | INTRAVENOUS | Status: DC
  Administered 2020-10-06 – 2020-10-08 (×5): 1000 mg via INTRAVENOUS
  Filled 2020-10-05 (×6): qty 200

## 2020-10-05 MED ORDER — INSULIN DETEMIR 100 UNIT/ML ~~LOC~~ SOLN
8.0000 [IU] | Freq: Every day | SUBCUTANEOUS | Status: DC
  Administered 2020-10-05 – 2020-10-07 (×3): 8 [IU] via SUBCUTANEOUS
  Filled 2020-10-05 (×5): qty 0.08

## 2020-10-05 MED ORDER — ENOXAPARIN SODIUM 40 MG/0.4ML ~~LOC~~ SOLN: 40.0000 mg | SUBCUTANEOUS | Status: DC

## 2020-10-05 NOTE — Sepsis Progress Note (Signed)
Notified bedside nurse of need to administer antibiotics.  

## 2020-10-05 NOTE — ED Provider Notes (Signed)
[facility]    CSN: [id number] Arrival date & time: 10/05/20  0849      History   Chief Complaint Chief Complaint  Patient presents with  . Wound Check    HPI [name] is a 59 y.o. male.   59 year old gentleman here for evaluation of right foot wound.  Patient has been seeing the wound clinic for treatment of open right foot wound for several months.  He is also seeing infectious disease at [facility] for osteomyelitis of wounds on his left foot that have resolved.  Patient was evaluated by the wound clinic 3 days ago and they were discussing putting him in a cast for slow progression of wound healing.  Patient denies any redness on Friday when he was evaluated.  Patient has redness and swelling with 1+ pitting edema to his right midfoot up to the level of the ankle.  Patient has 1+ DP and PT pedal pulses.  Patient has diabetic peripheral neuropathy and is unable to tell me if he has pain to palpation.  The area is hot.  He has 2 separate wounds.  There is 1 on the ball of the foot behind the right fifth toe that is the size of a nickel, it is boggy and draining a tan malodorous drainage.  There is a second wound on the superior lateral aspect of the right fifth toe level with the MTP joint.  It too is boggy, black, and draining a thick serosanguineous discharge.  Patient denies fever.     Past Medical History:  Diagnosis Date  . Arthritis    hands  . COVID-19 12/29/2019  . Diabetes mellitus without complication ([place])    type 2  . GERD (gastroesophageal reflux disease)   . Hypercholesterolemia   . Osteomyelitis of left foot [facility])     Patient Active Problem List   Diagnosis Date Noted  . PAD (peripheral artery disease) ([place]) 07/03/2020  . Type 2 diabetes mellitus with diabetic neuropathy, with long-term current use of insulin ([place]) 05/23/2020  . Diabetic ulcer of toe of right foot associated with type 2 diabetes mellitus ([place]) 05/23/2020  . Hyperlipidemia  associated with type 2 diabetes mellitus ([place]) 02/08/2020  . ED (erectile dysfunction) 12/28/2018  . Obesity 12/24/2018  . Type 2 diabetes mellitus with hyperglycemia ([place]) 07/23/2017    Past Surgical History:  Procedure Laterality Date  . ADENOIDECTOMY    . CATARACT EXTRACTION W/PHACO Right 03/03/2020   Procedure: CATARACT EXTRACTION PHACO AND INTRAOCULAR LENS PLACEMENT ([place]) RIGHT DIABETIC VISION BLUE;  Surgeon: Marchia Meiers, MD;  Location: [place];  Service: Ophthalmology;  Laterality: Right;  COVID ( + ) 12-29-2019  30.97 02:27.7  . CATARACT EXTRACTION W/PHACO Left 06/23/2020   Procedure: CATARACT EXTRACTION PHACO AND INTRAOCULAR LENS PLACEMENT ([facility]) LEFT DIABETIC VISION BLUE 5.14  00:34.7;  Surgeon: Marchia Meiers, MD;  Location: [place];  Service: Ophthalmology;  Laterality: Left;  DIABETIC  . ELBOW FRACTURE SURGERY Right   . EXTERNAL EAR SURGERY         Home Medications    Prior to Admission medications   Medication Sig Start Date End Date Taking? Authorizing Provider  Accu-Chek FastClix Lancets MISC USE LANCET(S) TO CHECK GLUCOSE UP TO 4 TIMES DAILY 06/09/19  Yes [provider]  ACCU-CHEK GUIDE test strip USE UP TO 4 TIMES DAILY AS DIRECTED 06/09/19  Yes [provider]  Ascorbic Acid (VITAMIN C CR) 1500 MG TBCR Take 1,500 mg by mouth daily.   Yes  [provider]  aspirin EC 81 MG tablet Take 81 mg by mouth daily.   Yes [provider]  atorvastatin (LIPITOR) 20 MG tablet Take 1 tablet (20 mg total) by mouth every other day. 05/23/20  Yes Johnson, Megan P, DO  blood glucose meter kit and supplies Dispense based on patient and insurance preference. Use up to four times daily as directed. (FOR ICD-10 E10.9, E11.9). 06/08/19  Yes Volney American, PA-C  Cinnamon 500 MG capsule Take 500 mg by mouth daily.   Yes [provider]  Garlic 4656 MG CAPS Take 1,000 mg by mouth daily.   Yes [provider]    glipiZIDE (GLUCOTROL) 5 MG tablet Take 1 tablet (5 mg total) by mouth 2 (two) times daily before a meal. Patient taking differently: Take 5 mg by mouth 2 (two) times daily. Breakfast and dinner 11/13/19  Yes Volney American, PA-C  insulin degludec (TRESIBA FLEXTOUCH) 100 UNIT/ML FlexTouch Pen Inject 0.18 mLs (18 Units total) into the skin daily. 04/14/20  Yes Volney American, PA-C  Insulin Pen Needle (PEN NEEDLES) 32G X 6 MM MISC 1 Units by Does not apply route daily as needed. 06/08/19  Yes Volney American, PA-C  metFORMIN (GLUCOPHAGE) 1000 MG tablet Take 1 tablet (1,000 mg total) by mouth 2 (two) times daily with a meal. Patient taking differently: Take 1,000 mg by mouth 2 (two) times daily with a meal. Breakfast and dinner 11/13/19  Yes Volney American, PA-C    Family History Family History  Problem Relation Age of Onset  . Dementia Mother   . Hypertension Mother   . Diabetes Maternal Grandmother   . Hypertension Maternal Grandmother   . Cancer Neg Hx   . COPD Neg Hx   . Heart disease Neg Hx   . Stroke Neg Hx     Social History Social History   Tobacco Use  . Smoking status: Never Smoker  . Smokeless tobacco: Never Used  Vaping Use  . Vaping Use: Never used  Substance Use Topics  . Alcohol use: Not Currently    Comment: occasionally  . Drug use: No     Allergies   Amoxicillin   Review of Systems Review of Systems  Constitutional: Negative for activity change, appetite change and fever.  HENT: Negative for congestion and rhinorrhea.   Respiratory: Negative for shortness of breath and wheezing.   Cardiovascular: Negative for leg swelling.  Musculoskeletal: Positive for joint swelling. Negative for arthralgias.       Swelling to right fifth toe and foot.  Skin: Positive for color change.       Redness to top of right foot.  Neurological: Negative for weakness.       Has diabetic peripheral neuropathy.  Hematological: Negative.    Psychiatric/Behavioral: Negative.      Physical Exam Triage Vital Signs ED Triage Vitals  Enc Vitals Group     BP 10/05/20 0918 (!) 148/81     Pulse Rate 10/05/20 0918 (!) 103     Resp 10/05/20 0918 18     Temp 10/05/20 0918 98.2 F (36.8 C)     Temp Source 10/05/20 0918 Oral     SpO2 10/05/20 0918 98 %     Weight 10/05/20 0915 246 lb (111.6 kg)     Height 10/05/20 0915 6' (1.829 m)     Head Circumference --      Peak Flow --      Pain Score 10/05/20 0915  8     Pain Loc --      Pain Edu? --      Excl. in [place]? --    No data found.  Updated Vital Signs BP (!) 148/81 (BP Location: Left Arm)   Pulse (!) 103   Temp 98.2 F (36.8 C) (Oral)   Resp 18   Ht 6' (1.829 m)   Wt 246 lb (111.6 kg)   SpO2 98%   BMI 33.36 kg/m   Visual Acuity Right Eye Distance:   Left Eye Distance:   Bilateral Distance:    Right Eye Near:   Left Eye Near:    Bilateral Near:     Physical Exam Vitals and nursing note reviewed.  Constitutional:      General: He is not in acute distress.    Appearance: Normal appearance. He is obese. He is not ill-appearing.  HENT:     Head: Normocephalic and atraumatic.  Eyes:     General: No scleral icterus.    Extraocular Movements: Extraocular movements intact.     Conjunctiva/sclera: Conjunctivae normal.     Pupils: Pupils are equal, round, and reactive to light.  Cardiovascular:     Rate and Rhythm: Normal rate and regular rhythm.     Pulses: Normal pulses.     Heart sounds: Normal heart sounds. No murmur heard.  No gallop.   Pulmonary:     Effort: Pulmonary effort is normal.     Breath sounds: Normal breath sounds. No wheezing, rhonchi or rales.  Musculoskeletal:        General: Swelling present.     Cervical back: Normal range of motion and neck supple.     Comments: Patient has swelling to the right midfoot and lateral aspect.  There is a swollen, boggy area over the right fifth MTP that is draining a thick sanguinous drainage.  The right  fifth toe is red and swollen.  On the sole of the foot behind the right fifth toe there is a nickel sized lesion that is boggy and draining a tan discharge with a foul odor.  Cultures of both aspects obtained.  Lymphadenopathy:     Cervical: No cervical adenopathy.  Skin:    Capillary Refill: Capillary refill takes more than 3 seconds. Cap refills 4 seconds.    Findings: Erythema and lesion present.  Neurological:     General: No focal deficit present.     Mental Status: He is alert and oriented to person, place, and time.     Sensory: Sensory deficit present.     Comments: Patient has peripheral neuropathy and cannot feel pain.  Patient feels mild pressure.  Psychiatric:        Mood and Affect: Mood normal.        Behavior: Behavior normal.        Thought Content: Thought content normal.        Judgment: Judgment normal.             UC Treatments / Results  Labs (all labs ordered are listed, but only abnormal results are displayed) Labs Reviewed  GLUCOSE, CAPILLARY - Abnormal; Notable for the following components:      Result Value   Glucose-Capillary 241 (*)    All other components within normal limits  AEROBIC CULTURE (SUPERFICIAL SPECIMEN)  AEROBIC CULTURE (SUPERFICIAL SPECIMEN)  CBG MONITORING, ED    EKG   Radiology DG Foot Complete Right  Result Date: 10/05/2020 CLINICAL DATA:  Right foot ulceration. EXAM: RIGHT  FOOT COMPLETE - 3+ VIEW COMPARISON:  July 19, 2017. FINDINGS: There is no evidence of fracture or dislocation. There is no evidence of arthropathy. Moderate posterior calcaneal spurring is noted. No lytic destruction is seen to suggest osteomyelitis. Probable focal soft tissue swelling and ulceration is seen lateral to the fifth metatarsophalangeal joint. IMPRESSION: Probable focal soft tissue swelling and ulceration is seen lateral to the fifth metatarsophalangeal joint. No lytic destruction is seen to suggest osteomyelitis. Electronically Signed   By:  Marijo Conception M.D.   On: 10/05/2020 10:15    Procedures Procedures (including critical care time)  Medications Ordered in UC Medications - No data to display  Initial Impression / Assessment and Plan / UC Course  I have reviewed the triage vital signs and the nursing notes.  Pertinent labs & imaging results that were available during my care of the patient were reviewed by me and considered in my medical decision making (see chart for details).   Is a diabetic and has been evaluated and being treated at the wound clinic for diabetic foot ulcers.  He was just evaluated on Friday and his right foot wound was slow healing but there is no redness or swelling.  Patient states he woke up this morning and there was redness on the top of his foot, his foot was swollen, and there was a discolored area to the side of his right fifth toe.  Patient has drainage from 2 wounds on the right lateral aspect of his foot.  The one on the sole has 10 mL drainage, the superior lateral aspect over the MTP is black in color, boggy, and when pressed there is a thick sanguinous drainage that erupts from a break in the skin.  There is 1+ edema to the midfoot, it is hot and red.  Patient has not had a fever.  Cultures collected from both wound aspects will x-ray foot to look for osteomyelitis.  Plan is to have patient evaluated at the wound clinic or send to the hospital.  Wound care note from 1022 shows a 0.5 cm x 0.5 cm right plantar surface wound that had pink granulation tissue and some serosanguineous drainage.  Presentation today is a definite interval change.  Spoke with Cornell Barman at wound care clinic who compared brought his images with images from today and is recommending ER evaluation for IV antibiotics and possible admission.  Will DC patient to [facility] ER for further evaluation.   Final Clinical Impressions(s) / UC Diagnoses   Final diagnoses:  Wound infection     Discharge Instructions     After  conversing with [facility] wound care clinic they are advising we send you to the ER for evaluation, IV antibiotics, and possible admission.    ED Prescriptions    None     PDMP not reviewed this encounter.   Margarette Canada, NP 10/05/20 1040

## 2020-10-05 NOTE — H&P (Signed)
History and Physical    PLEASE NOTE THAT DRAGON DICTATION SOFTWARE WAS USED IN THE CONSTRUCTION OF THIS NOTE.   [name] IPJ:[id number] DOB: 1961-07-01 DOA: 10/05/2020  PCP: Patient, No Pcp Per Patient coming from: home   I have personally briefly reviewed patient's old medical records in [place]  Chief Complaint: Right foot redness  HPI: [name] is a 59 y.o. male with medical history significant for recurrent diabetic foot ulcers, osteomyelitis of the left foot, type 2 diabetes mellitus with most recent hemoglobin A1c 7.3% in June 2021, hyperlipidemia, who is admitted to [facility] on 10/05/2020 with sepsis due to right lower extremity cellulitis after presenting from home to [facility] Emergency Department complaining of right foot erythema.   The patient has been experiencing a ulcer on the plantar aspect of the right foot for approximately the last month, for which he has been following in wound care clinic.  In spite of compliance with associated recommendations, the patient reports 2 to 3 days of progressive erythema over the dorsal surface of the right foot associated with increased warmth, swelling and purulent drainage.  Denies any associated subjective fever, chills, rigors, or generalized myalgias.  Denies any recent trauma to the right lower extremity.  Denies any acute rash at any other location.   Denies any recent headache, neck stiffness, rhinitis, rhinorrhea, sore throat, shortness of breath, cough, nausea, vomiting, abdominal pain, or diarrhea.  No recent traveling or known COVID-19 exposures.  Denies any recent dysuria, gross hematuria, or change in urinary urgency/frequency.  Denies any recent chest pain, diaphoresis, palpitations, orthopnea, or PND.  Past medical history is notable for type 2 diabetes mellitus, with most recent prior hemoglobin A1c noted to be 7.3% in June 2021.  He is on Metformin and glipizide as well as  daily [place] as an outpatient.    ED Course:  Vital signs in the ED were notable for the following: T-max 98.8; initial heart rate 110, which decreased to 91 following interval IV fluids, as further qualified below; blood pressure 154/76; respiratory rate 17, oxygen saturation 98 to 99% on room air.  Labs were notable for the following: CMP notable for the following: Sodium 131 which corrects to 134 when taking into account concomitant hyperglycemia, potassium 4.0, bicarbonate 24, BUN 32, creatinine 1.06, glucose 324.  ESR 87.  CBC notable for white blood cell count of 15,000 with 87% neutrophils.  Lactic acid 1.4.  Urinalysis was notable for no white blood cells, no bacteria, leukocyte esterase negative, nitrate negative, and the presence of 5 ketones.  Screening COVID-19 PCR as well as influenza PCR both found to be negative in the ED this evening.  Cultures x2 were collected in the ED today prior to initiation of antibiotics.  Plan labs of the right foot showed probable focal soft tissue swelling and ulceration seen lateral to the fifth met the tarsal pharyngeal joint in the absence of any lytic destruction to suggest osteomyelitis.  Subsequently, MRI of the right foot has been ordered to further evaluate for any underlying osteomyelitis, presumptively pending.  The patient's case was discussed with the on-call podiatrist, Dr. Luana Shu, who will formally consult and evaluate the patient in person, with ensuing additional recommendation pending at this time.  While in the ED, the following were administered: Tylenol 1 g p.o. x1, NovoLog 3 units subcu x1, dose of IV vancomycin, lactated Ringer's x2 L bolus followed by transition to continuous lactated Ringer's at 150 cc/h.  Subsequently, the  patient was admitted to the med telemetry floor for further evaluation and management of presenting sepsis due to right lower extremity cellulitis.    Review of Systems: As per HPI otherwise 10 point review of  systems negative.   Past Medical History:  Diagnosis Date  . Arthritis    hands  . COVID-19 12/29/2019  . Diabetes mellitus without complication ([place])    type 2  . GERD (gastroesophageal reflux disease)   . Hypercholesterolemia   . Osteomyelitis of left foot [facility])     Past Surgical History:  Procedure Laterality Date  . ADENOIDECTOMY    . CATARACT EXTRACTION W/PHACO Right 03/03/2020   Procedure: CATARACT EXTRACTION PHACO AND INTRAOCULAR LENS PLACEMENT ([place]) RIGHT DIABETIC VISION BLUE;  Surgeon: Marchia Meiers, MD;  Location: [place];  Service: Ophthalmology;  Laterality: Right;  COVID ( + ) 12-29-2019  30.97 02:27.7  . CATARACT EXTRACTION W/PHACO Left 06/23/2020   Procedure: CATARACT EXTRACTION PHACO AND INTRAOCULAR LENS PLACEMENT ([facility]) LEFT DIABETIC VISION BLUE 5.14  00:34.7;  Surgeon: Marchia Meiers, MD;  Location: [place];  Service: Ophthalmology;  Laterality: Left;  DIABETIC  . ELBOW FRACTURE SURGERY Right   . EXTERNAL EAR SURGERY      Social History:  reports that he has never smoked. He has never used smokeless tobacco. He reports previous alcohol use. He reports that he does not use drugs.   Allergies  Allergen Reactions  . Amoxicillin Rash    Family History  Problem Relation Age of Onset  . Dementia Mother   . Hypertension Mother   . Diabetes Maternal Grandmother   . Hypertension Maternal Grandmother   . Cancer Neg Hx   . COPD Neg Hx   . Heart disease Neg Hx   . Stroke Neg Hx      Prior to Admission medications   Medication Sig Start Date End Date Taking? Authorizing Provider  Accu-Chek FastClix Lancets MISC USE LANCET(S) TO CHECK GLUCOSE UP TO 4 TIMES DAILY 06/09/19   [provider]  ACCU-CHEK GUIDE test strip USE UP TO 4 TIMES DAILY AS DIRECTED 06/09/19   [provider]  Ascorbic Acid (VITAMIN C CR) 1500 MG TBCR Take 1,500 mg by mouth daily.    [provider]  aspirin EC 81 MG tablet Take 81 mg by mouth  daily.    [provider]  atorvastatin (LIPITOR) 20 MG tablet Take 1 tablet (20 mg total) by mouth every other day. 05/23/20   Johnson, Megan P, DO  blood glucose meter kit and supplies Dispense based on patient and insurance preference. Use up to four times daily as directed. (FOR ICD-10 E10.9, E11.9). 06/08/19   Volney American, PA-C  Cinnamon 500 MG capsule Take 500 mg by mouth daily.    [provider]  Garlic 8828 MG CAPS Take 1,000 mg by mouth daily.    [provider]  glipiZIDE (GLUCOTROL) 5 MG tablet Take 1 tablet (5 mg total) by mouth 2 (two) times daily before a meal. Patient taking differently: Take 5 mg by mouth 2 (two) times daily. Breakfast and dinner 11/13/19   Volney American, PA-C  insulin degludec (TRESIBA FLEXTOUCH) 100 UNIT/ML FlexTouch Pen Inject 0.18 mLs (18 Units total) into the skin daily. 04/14/20   Volney American, PA-C  Insulin Pen Needle (PEN NEEDLES) 32G X 6 MM MISC 1 Units by Does not apply route daily as needed. 06/08/19   Volney American, PA-C  metFORMIN (GLUCOPHAGE) 1000 MG tablet  Take 1 tablet (1,000 mg total) by mouth 2 (two) times daily with a meal. Patient taking differently: Take 1,000 mg by mouth 2 (two) times daily with a meal. Breakfast and dinner 11/13/19   Volney American, PA-C     Objective    Physical Exam: Vitals:   10/05/20 1239 10/05/20 1240  BP: (!) 154/76   Pulse: (!) 110   Resp: 17   Temp: 98.8 F (37.1 C)   TempSrc: Oral   SpO2: 99%   Weight:  110.7 kg  Height:  6' (1.829 m)    General: appears to be stated age; alert, oriented Skin: warm; dorsal surface of right foot noted to be associated with erythema, swelling, increased warmth. Head:  AT/[place] Eyes:  PEARL b/l, EOMI Mouth:  Oral mucosa membranes appear dry, normal dentition Neck: supple; trachea midline Heart:  RRR; did not appreciate any M/R/G Lungs: CTAB, did not appreciate any wheezes, rales, or rhonchi Abdomen: +  BS; soft, ND, NT Extremities: no muscle wasting; ulcer noted to plantar surface of the right foot; additionally, erythema, increased warmth, and swelling noted to be associated with the dorsal surface of the right foot, as further described above. Neuro: strength and sensation intact in upper and lower extremities b/l   Labs on Admission: I have personally reviewed following labs and imaging studies  CBC: Recent Labs  Lab 10/05/20 1444  WBC 15.3*  NEUTROABS 13.3*  HGB 12.4*  HCT 35.3*  MCV 91.7  PLT 527   Basic Metabolic Panel: Recent Labs  Lab 10/05/20 1444  NA 131*  K 4.0  CL 95*  CO2 24  GLUCOSE 324*  BUN 32*  CREATININE 1.06  CALCIUM 8.3*   GFR: Estimated Creatinine Clearance: 98.7 mL/min (by C-G formula based on SCr of 1.06 mg/dL). Liver Function Tests: Recent Labs  Lab 10/05/20 1444  AST 25  ALT 21  ALKPHOS 57  BILITOT 1.2  PROT 7.3  ALBUMIN 3.5   No results for input(s): LIPASE, AMYLASE in the last 168 hours. No results for input(s): AMMONIA in the last 168 hours. Coagulation Profile: Recent Labs  Lab 10/05/20 1444  INR 1.3*   Cardiac Enzymes: No results for input(s): CKTOTAL, CKMB, CKMBINDEX, TROPONINI in the last 168 hours. BNP (last 3 results) No results for input(s): PROBNP in the last 8760 hours. HbA1C: No results for input(s): HGBA1C in the last 72 hours. CBG: Recent Labs  Lab 10/05/20 0926  GLUCAP 241*   Lipid Profile: No results for input(s): CHOL, HDL, LDLCALC, TRIG, CHOLHDL, LDLDIRECT in the last 72 hours. Thyroid Function Tests: No results for input(s): TSH, T4TOTAL, FREET4, T3FREE, THYROIDAB in the last 72 hours. Anemia Panel: No results for input(s): VITAMINB12, FOLATE, FERRITIN, TIBC, IRON, RETICCTPCT in the last 72 hours. Urine analysis:    Component Value Date/Time   COLORURINE YELLOW 07/19/2017 1903   APPEARANCEUR Clear 12/24/2018 1544   LABSPEC >1.030 (H) 07/19/2017 1903   PHURINE 5.0 07/19/2017 1903   GLUCOSEU 2+  (A) 12/24/2018 1544   HGBUR NEGATIVE 07/19/2017 1903   BILIRUBINUR Negative 12/24/2018 1544   KETONESUR NEGATIVE 07/19/2017 1903   PROTEINUR 2+ (A) 12/24/2018 1544   PROTEINUR 100 (A) 07/19/2017 1903   NITRITE Negative 12/24/2018 1544   NITRITE NEGATIVE 07/19/2017 1903   LEUKOCYTESUR Negative 12/24/2018 1544    Radiological Exams on Admission: DG Foot Complete Right  Result Date: 10/05/2020 CLINICAL DATA:  Right foot ulceration. EXAM: RIGHT FOOT COMPLETE - 3+ VIEW COMPARISON:  July 19, 2017. FINDINGS: There  is no evidence of fracture or dislocation. There is no evidence of arthropathy. Moderate posterior calcaneal spurring is noted. No lytic destruction is seen to suggest osteomyelitis. Probable focal soft tissue swelling and ulceration is seen lateral to the fifth metatarsophalangeal joint. IMPRESSION: Probable focal soft tissue swelling and ulceration is seen lateral to the fifth metatarsophalangeal joint. No lytic destruction is seen to suggest osteomyelitis. Electronically Signed   By: Marijo Conception M.D.   On: 10/05/2020 10:15     Assessment/Plan   Adalbert Alberto is a 59 y.o. male with medical history significant for recurrent diabetic foot ulcers, osteomyelitis of the left foot, type 2 diabetes mellitus with most recent hemoglobin A1c 7.3% in June 2021, hyperlipidemia, who is admitted to [facility] on 10/05/2020 with sepsis due to right lower extremity cellulitis after presenting from home to [facility] Emergency Department complaining of right foot erythema.   Principal Problem:   Cellulitis of right lower extremity Active Problems:   Type 2 diabetes mellitus with hyperglycemia (HCC)   Hyperlipidemia associated with type 2 diabetes mellitus ([place])   Type 2 diabetes mellitus with diabetic neuropathy, with long-term current use of insulin (HCC)   Sepsis (HCC)   Acute prerenal azotemia     #) Sepsis due to right lower extremity cellulitis:  Diagnosis on the basis of to 3 days of progressive erythema, increased warmth, and swelling over the dorsal aspect of the right foot associated with purulent drainage increasing likelihood MRSA, with plain films of the right foot showing evidence of soft tissue swelling consistent with cellulitis in the absence of any evidence to suggest osteomyelitis.  Of note, MRI of the right foot to further evaluate for any underlying osteomyelitis, as currently pending.  No evidence of crepitus on exam to suggest necrotizing fasciitis.   SIRS criteria met via presenting tachycardia as well as leukocytosis.  Patient sepsis does not meet criteria to be considered severe nature in the absence of any associated evidence of endorgan damage, including a nonelevated lactic acid of 1.4.  However cellulitis in the context of concomitant sepsis meets criteria for cellulitis to be considered severe nature.  Overall, in the setting of severe purulent cellulitis, pneumatic stewardship committee recommends monitor antimicrobial intervention.  Consistent with this, patient received a dose of IV vancomycin in the ED today following collection of blood cultures x2.   Presentation is in the context of a documented history of recurrent diabetic foot ulcers, with preceding ulcer on the plantar surface of the right foot representing a potential portal of entry predisposing patient for ensuing development of cellulitis.  Of note, there is also some documentation of a history of peripheral artery disease which be relevant healing of existing ulcerative lesions as well as risk reduction for subsequent cellulitis.  The patient's case was discussed with the on-call podiatrist, Dr. Luana Shu, who will formally consult and will evaluate the patient in person.   Of note, no additional source of underlying infection has been this time, including presenting urinalysis and consistent with urinary tract infection, while COVID-19 PCR as well as influenza PCR  have been found to be positive in the ED this evening.    Plan: Continue IV vancomycin additional antimicrobial intervention, as above.  Follow for results blood cultures x2 collected in the ED today prior to initiation of tamoxifen repeat CBC with differential in the morning.  Formal podiatry consult, as above.  Follow for results of MRI of the right foot.  As needed IV fentanyl.      #)  Prerenal azotemia: Presenting labs reflect BUN to creatinine ratio of 30 in the absence of any true acute kidney injury.  Suspect this is acute prerenal azotemia is on the basis of mild intravascular depletion in the context of increased capillary permeability in the setting of concomitant sepsis due to right lower extremity cellulitis.  Urinalysis showed urinary casts.  Plan: Continue lactated Ringer's overnight.  Monitor strict I's and O's and daily weights.  And provide nephrotoxic agents.  Repeat BMP in the morning.  Work-up and management of presenting sepsis due to right lower extremity cellulitis.      #) Type 2 diabetes mellitus: Most recent hemoglobin A1c noted to be 7.3% when checked in June 2021.  Outpatient oral hypoglycemic regimen consists of Metformin as well as glipizide.  Additionally, he is on long-acting insulin in the form Tresiba 18 units subcu daily, although it does not appear that he is presenting blood sugar per presenting CMP noted to be 324, likely representing a relative hypoglycemia in the setting of physiologic stress stemming from sepsis in the context of right lower extremity cellulitis.  Plan: Hold glipizide and Metformin during this hospitalization.  Accu-Cheks before every meal and at bedtime with moderate dose sliding cell insulin.  I have ordered, conservatively, Levemir 8 units subcu nightly, increased depending upon degree of stress related hyperglycemia.  Check hemoglobin A1c.      #) Hyperlipidemia: On Lipitor as an outpatient.  Plan: Continue home  Lipitor.     DVT prophylaxis: Lovenox 40 mg subcu daily Code Status: Full code Family Communication: none Disposition Plan: Per Rounding Team Consults called: Patient's case was discussed with the on-call podiatrist, Dr. Luana Shu, as further described above. Admission status: Inpatient; med telemetry.    PLEASE NOTE THAT DRAGON DICTATION SOFTWARE WAS USED IN THE CONSTRUCTION OF THIS NOTE.   [place] [facility] Pager [phone] From 12PM- 12AM  Otherwise, please contact night-coverage  [email or web address] Password [email or web address]  10/05/2020, 4:30 PM

## 2020-10-05 NOTE — ED Triage Notes (Signed)
Patient states that he has a wound on his right foot. States that he has been seeing the wound clinic for this. States that yesterday he started having redness spreading up his foot. States that he called the wound clinic and they couldn't see him today. Patient states that the redness is now spreading up higher and was concerned for infection. States that they sees the wound clinic again next Thursday.

## 2020-10-05 NOTE — ED Provider Notes (Addendum)
.  Critical Care Performed by: Gilles Chiquito, MD Authorized by: Gilles Chiquito, MD   Critical care provider statement:    Critical care time (minutes):  45   Critical care time was exclusive of:  Separately billable procedures and treating other patients   Critical care was necessary to treat or prevent imminent or life-threatening deterioration of the following conditions:  Sepsis (sepsis w/ multiple fluid bolus )   Critical care was time spent personally by me on the following activities:  Discussions with consultants, evaluation of patient's response to treatment, examination of patient, ordering and performing treatments and interventions, ordering and review of laboratory studies, ordering and review of radiographic studies, pulse oximetry, re-evaluation of patient's condition, obtaining history from patient or surrogate and review of old charts     Gilles Chiquito, MD 10/05/20 1727    Gilles Chiquito, MD 10/05/20 1727

## 2020-10-05 NOTE — ED Triage Notes (Signed)
Pt comes in via POV c/o foot pain from diabetic wounds on the right foot.  Pt established with wound clinic but states the pain has gotten worse over the past 2 days. Pt in NAd at this time.

## 2020-10-05 NOTE — ED Notes (Signed)
Pt transported to MRI 

## 2020-10-05 NOTE — ED Provider Notes (Addendum)
[facility] Emergency Department Provider Note ____________________________________________  Time seen: 56  I have reviewed the triage vital signs and the nursing notes.  HISTORY  Chief Complaint  Wound Check   HPI [name] is a 59 y.o. male with a history of diabetes type 2, osteomyelitis of the left foot, peripheral arterial disease, and diabetic foot ulcers, presents to the ED following initial evaluation at [facility] urgent care.  According to the patient, he is being followed routinely by [place] [facility].  He had a recent evaluation and management with outpatient antibiotics including Levaquin and  Flagyl, for a duration of about 6 weeks.  According to the patient as well, he had been discharged from the clinic care as his left foot wound was stable and healing.  His right foot also had been managed well.  Patient was seen at an [place] wound care center on Friday, most recently.  There they voiced some concern over progression of his plantar surface right foot ulcer.  Apparently discussed placing the patient in an [place] for slow healing wound.  Patient was leary of the prospect of wearing the boot and being out of work for extended period time.  He declined at that moment, but found himself presented to [facility] urgent care today after acutely increasing foot redness, pain, and development of a purulent blister on the top of the foot.  He relates that the ulceration may be due to his steel toe boots that he wears at his job.  He denies any interim fever, chills, or sweats.  Patient presents as advised for evaluation of his progressive right foot redness, swelling, and purulent drainage.  Wound cultures were collected and are being process from [facility] urgent care.  Initial plain films did not reveal any subcutaneous air or bony lytic lesions.  Past Medical History:  Diagnosis Date   Arthritis    hands   COVID-19 12/29/2019   Diabetes  mellitus without complication ([place])    type 2   GERD (gastroesophageal reflux disease)    Hypercholesterolemia    Osteomyelitis of left foot [facility])     Patient Active Problem List   Diagnosis Date Noted   PAD (peripheral artery disease) ([place]) 07/03/2020   Type 2 diabetes mellitus with diabetic neuropathy, with long-term current use of insulin ([place]) 05/23/2020   Diabetic ulcer of toe of right foot associated with type 2 diabetes mellitus ([place]) 05/23/2020   Hyperlipidemia associated with type 2 diabetes mellitus ([place]) 02/08/2020   ED (erectile dysfunction) 12/28/2018   Obesity 12/24/2018   Type 2 diabetes mellitus with hyperglycemia ([place]) 07/23/2017    Past Surgical History:  Procedure Laterality Date   ADENOIDECTOMY     CATARACT EXTRACTION W/PHACO Right 03/03/2020   Procedure: CATARACT EXTRACTION PHACO AND INTRAOCULAR LENS PLACEMENT ([place]) RIGHT DIABETIC VISION BLUE;  Surgeon: Marchia Meiers, MD;  Location: [place];  Service: Ophthalmology;  Laterality: Right;  COVID ( + ) 12-29-2019  30.97 02:27.7   CATARACT EXTRACTION W/PHACO Left 06/23/2020   Procedure: CATARACT EXTRACTION PHACO AND INTRAOCULAR LENS PLACEMENT ([facility]) LEFT DIABETIC VISION BLUE 5.14  00:34.7;  Surgeon: Marchia Meiers, MD;  Location: [place];  Service: Ophthalmology;  Laterality: Left;  DIABETIC   ELBOW FRACTURE SURGERY Right    EXTERNAL EAR SURGERY      Prior to Admission medications   Medication Sig Start Date End Date Taking? Authorizing Provider  Accu-Chek FastClix Lancets MISC USE LANCET(S) TO CHECK GLUCOSE UP TO 4 TIMES  DAILY 06/09/19   [provider]  ACCU-CHEK GUIDE test strip USE UP TO 4 TIMES DAILY AS DIRECTED 06/09/19   [provider]  Ascorbic Acid (VITAMIN C CR) 1500 MG TBCR Take 1,500 mg by mouth daily.    [provider]  aspirin EC 81 MG tablet Take 81 mg by mouth daily.    [provider]  atorvastatin (LIPITOR) 20 MG tablet Take 1  tablet (20 mg total) by mouth every other day. 05/23/20   Johnson, Megan P, DO  blood glucose meter kit and supplies Dispense based on patient and insurance preference. Use up to four times daily as directed. (FOR ICD-10 E10.9, E11.9). 06/08/19   Volney American, PA-C  Cinnamon 500 MG capsule Take 500 mg by mouth daily.    [provider]  Garlic 0998 MG CAPS Take 1,000 mg by mouth daily.    [provider]  glipiZIDE (GLUCOTROL) 5 MG tablet Take 1 tablet (5 mg total) by mouth 2 (two) times daily before a meal. Patient taking differently: Take 5 mg by mouth 2 (two) times daily. Breakfast and dinner 11/13/19   Volney American, PA-C  insulin degludec (TRESIBA FLEXTOUCH) 100 UNIT/ML FlexTouch Pen Inject 0.18 mLs (18 Units total) into the skin daily. 04/14/20   Volney American, PA-C  Insulin Pen Needle (PEN NEEDLES) 32G X 6 MM MISC 1 Units by Does not apply route daily as needed. 06/08/19   Volney American, PA-C  metFORMIN (GLUCOPHAGE) 1000 MG tablet Take 1 tablet (1,000 mg total) by mouth 2 (two) times daily with a meal. Patient taking differently: Take 1,000 mg by mouth 2 (two) times daily with a meal. Breakfast and dinner 11/13/19   Volney American, PA-C    Allergies Amoxicillin  Family History  Problem Relation Age of Onset   Dementia Mother    Hypertension Mother    Diabetes Maternal Grandmother    Hypertension Maternal Grandmother    Cancer Neg Hx    COPD Neg Hx    Heart disease Neg Hx    Stroke Neg Hx     Social History Social History   Tobacco Use   Smoking status: Never Smoker   Smokeless tobacco: Never Used  Scientific laboratory technician Use: Never used  Substance Use Topics   Alcohol use: Not Currently    Comment: occasionally   Drug use: No    Review of Systems  Constitutional: Negative for fever. Eyes: Negative for visual changes. ENT: Negative for sore throat. Cardiovascular: Negative for chest  pain. Respiratory: Negative for shortness of breath. Gastrointestinal: Negative for abdominal pain, vomiting and diarrhea. Genitourinary: Negative for dysuria. Musculoskeletal: Negative for back pain.  Right foot redness and infection as above. Skin: Negative for rash.  Neurological: Negative for headaches, focal weakness or numbness. ____________________________________________  PHYSICAL EXAM:  VITAL SIGNS: ED Triage Vitals  Enc Vitals Group     BP 10/05/20 1239 (!) 154/76     Pulse Rate 10/05/20 1239 (!) 110     Resp 10/05/20 1239 17     Temp 10/05/20 1239 98.8 F (37.1 C)     Temp Source 10/05/20 1239 Oral     SpO2 10/05/20 1239 99 %     Weight 10/05/20 1240 244 lb (110.7 kg)     Height 10/05/20 1240 6' (1.829 m)     Head Circumference --      Peak Flow --      Pain Score 10/05/20 1239 6  Pain Loc --      Pain Edu? --      Excl. in [place]? --     Constitutional: Alert and oriented. Well appearing and in no distress. Head: Normocephalic and atraumatic. Eyes: Conjunctivae are normal. Normal extraocular movements Cardiovascular: Normal rate, regular rhythm. Normal distal pulses and cap refill. Respiratory: Normal respiratory effort. No wheezes/rales/rhonchi. Gastrointestinal: Soft and nontender. No distention. Musculoskeletal: Nontender with normal range of motion in all extremities. Right foot with a local area of erythema surrounding an intact blister over the dorsal 5th MTP. There is erythema and warmth over the majority of the dorsal foot from the 2nd to 5th MTs. There is a chronic-appearing well-circumscribed ulcer to the plantar 5th MTP. Neurologic:  Normal gait without ataxia. Normal speech and language. No gross focal neurologic deficits are appreciated. Skin:  Skin is warm, dry and intact. No rash noted. Psychiatric: Mood and affect are normal. Patient exhibits appropriate insight and judgment. ____________________________________________   LABS (pertinent  positives/negatives) Labs Reviewed  COMPREHENSIVE METABOLIC PANEL - Abnormal; Notable for the following components:      Result Value   Sodium 131 (*)    Chloride 95 (*)    Glucose, Bld 324 (*)    BUN 32 (*)    Calcium 8.3 (*)    All other components within normal limits  CBC WITH DIFFERENTIAL/PLATELET - Abnormal; Notable for the following components:   WBC 15.3 (*)    RBC 3.85 (*)    Hemoglobin 12.4 (*)    HCT 35.3 (*)    Neutro Abs 13.3 (*)    Abs Immature Granulocytes 0.09 (*)    All other components within normal limits  SEDIMENTATION RATE - Abnormal; Notable for the following components:   Sed Rate 87 (*)    All other components within normal limits  PROTIME-INR - Abnormal; Notable for the following components:   Prothrombin Time 16.1 (*)    INR 1.3 (*)    All other components within normal limits  CULTURE, BLOOD (ROUTINE X 2)  CULTURE, BLOOD (ROUTINE X 2)  RESPIRATORY PANEL BY RT PCR (FLU A&B, COVID)  LACTIC ACID, PLASMA  MAGNESIUM  LACTIC ACID, PLASMA  URINALYSIS, COMPLETE (UACMP) WITH MICROSCOPIC  C-REACTIVE PROTEIN  HEMOGLOBIN A1C  ____________________________________________   RADIOLOGY  DG Right Foot  IMPRESSION: Probable focal soft tissue swelling and ulceration is seen lateral to the fifth metatarsophalangeal joint. No lytic destruction is seen to suggest osteomyelitis. ____________________________________________  PROCEDURES  LR 1000 ml bolus Tylenol 1000 mg PO  Procedures ____________________________________________  INITIAL IMPRESSION / ASSESSMENT AND PLAN / ED COURSE  DDX: cellulitis, cutaneous abscess, osteomyelitis, diabetic foot, venous stasis ulcer  She presents to the ED for evaluation and management of a sharply worsening foot ulceration on the right.  He had been previously followed by [place] wound care center and was recently charged from [facility] for management of left foot osteomyelitis.  Patient presented to [facility] urgent care  initially with concern over increasing foot redness and purulent drainage.  His work-up there was concerning for progressive foot infection and concern for osteomyelitis but he was referred to the ED directly for evaluation, IV management, and likely admission.  Patient was found on exam to have a red, hot, inflamed right foot, with a worsening dorsal ulcer and a chronic appearing plantar foot ulcer.  Patient's labs showed a leukocytosis of 15.3 with a left shift.  He was also found to have an elevated sed rate 87 at the time of  this disposition.  Patient presented normotensive and afebrile, but tachycardic.  He was agreeable to admission following lab assessment and his clinical presentation.  Patient restarted on vancomycin empirically and wound cultures are pending from [facility] urgent care.  Blood cultures have been collected and the CRP is currently pending.  Code sepsis was initiated and initial carrier fluids were hung.  Patient will be followed by the hospitalist and consulted by podiatry.  Cleophus Mendonsa was evaluated in Emergency Department on 10/05/2020 for the symptoms described in the history of present illness. He was evaluated in the context of the global COVID-19 pandemic, which necessitated consideration that the patient might be at risk for infection with the SARS-CoV-2 virus that causes COVID-19. Institutional protocols and algorithms that pertain to the evaluation of patients at risk for COVID-19 are in a state of rapid change based on information released by regulatory bodies including the CDC and federal and state organizations. These policies and algorithms were followed during the patient's care in the ED. ____________________________________________  FINAL CLINICAL IMPRESSION(S) / ED DIAGNOSES  Final diagnoses:  Cellulitis of right foot  Open wound of right foot, initial encounter      Melvenia Needles, PA-C 10/05/20 1718    Terrisa Curfman, Dannielle Karvonen, PA-C 10/05/20  1720    Lucrezia Starch, MD 10/05/20 1723    Lucrezia Starch, MD 10/05/20 1726

## 2020-10-05 NOTE — Consult Note (Addendum)
Pharmacy Antibiotic Note  [name] is a 59 y.o. male with medical history including diabetes, peripheral artery disease admitted on 10/05/2020 with diabetic foot infection. Patient has been following with infectious diseases at [facility] for left foot osteomyelitis. Per chart, appears he was treated with levofloxacin + metronidazole 07/07/20 - 08/18/20 (6 weeks). He also follows with wound clinic. Pharmacy has been consulted for vancomycin dosing.  Plan: Patient received LD of vancomycin 2 g IV x 1 in the ED  Will start maintenance regimen of vancomycin 1000 mg q12h --Goal 15 - 20 mcg/mL for osteomyelitis --Daily Scr per protocol --Levels at steady state as indicated  Height: 6' (182.9 cm) Weight: 110.7 kg (244 lb) IBW/kg (Calculated) : 77.6  Temp (24hrs), Avg:98.5 F (36.9 C), Min:98.2 F (36.8 C), Max:98.8 F (37.1 C)  Recent Labs  Lab 10/05/20 1444  WBC 15.3*  CREATININE 1.06  LATICACIDVEN 1.4    Estimated Creatinine Clearance: 98.7 mL/min (by C-G formula based on SCr of 1.06 mg/dL).    Allergies  Allergen Reactions  . Amoxicillin Rash    Antimicrobials this admission: Vancomycin 10/27 >>   Dose adjustments this admission:  Microbiology results: 10/27 Wound culture: pending 10/27 BCx: pending  Thank you for allowing pharmacy to be a part of this patient's care.  Tressie Ellis 10/05/2020 8:13 PM

## 2020-10-05 NOTE — Consult Note (Signed)
PODIATRY / FOOT AND ANKLE SURGERY CONSULTATION NOTE  Requesting Physician: Terrilee Files, MD  Reason for consult: R 5th MTPJ ulceration/infection/cellulitis  Chief Complaint: R foot ulcer and infection   HPI: [name] is a 59 y.o. male who presents with a nonhealing ulceration to the R 5th MTPJ with associated drainage and cellulitis.  Pt has been seen in the wound care center for this for several months and for wounds on his L foot.  He notes that the L 2nd toe had a wound that may have had osteomyelitis in the past but that wound is now healed.  He notes also that he has a small wound at the L IPJ hallux dorsomedial.  He recently over past few days noted increased redness, swelling, and drainage coming from the R 5th MTPJ ulceration.  Pt was sent to the ED for further eval.  Podiatry team was consulted for further evaluation and care.    PMHx:  Past Medical History:  Diagnosis Date  . Arthritis    hands  . COVID-19 12/29/2019  . Diabetes mellitus without complication (HCC)    type 2  . GERD (gastroesophageal reflux disease)   . Hypercholesterolemia   . Osteomyelitis of left foot [facility])     Surgical Hx:  Past Surgical History:  Procedure Laterality Date  . ADENOIDECTOMY    . CATARACT EXTRACTION W/PHACO Right 03/03/2020   Procedure: CATARACT EXTRACTION PHACO AND INTRAOCULAR LENS PLACEMENT (IOC) RIGHT DIABETIC VISION BLUE;  Surgeon: Elliot Cousin, MD;  Location: [facility];  Service: Ophthalmology;  Laterality: Right;  COVID ( + ) 12-29-2019  30.97 02:27.7  . CATARACT EXTRACTION W/PHACO Left 06/23/2020   Procedure: CATARACT EXTRACTION PHACO AND INTRAOCULAR LENS PLACEMENT (IOC) LEFT DIABETIC VISION BLUE 5.14  00:34.7;  Surgeon: Elliot Cousin, MD;  Location: [facility];  Service: Ophthalmology;  Laterality: Left;  DIABETIC  . ELBOW FRACTURE SURGERY Right   . EXTERNAL EAR SURGERY      FHx:  Family History  Problem Relation Age of Onset  . Dementia Mother   .  Hypertension Mother   . Diabetes Maternal Grandmother   . Hypertension Maternal Grandmother   . Cancer Neg Hx   . COPD Neg Hx   . Heart disease Neg Hx   . Stroke Neg Hx     Social History:  reports that he has never smoked. He has never used smokeless tobacco. He reports previous alcohol use. He reports that he does not use drugs.  Allergies:  Allergies  Allergen Reactions  . Amoxicillin Rash    Review of Systems: General ROS: positive for  - fever Respiratory ROS: no cough, shortness of breath, or wheezing Cardiovascular ROS: no chest pain or dyspnea on exertion Gastrointestinal ROS: no abdominal pain, change in bowel habits, or black or bloody stools Musculoskeletal ROS: positive for - joint pain, joint stiffness and joint swelling Neurological ROS: positive for - numbness/tingling  (Not in a hospital admission)   Physical Exam: General: Alert and oriented.  No apparent distress.  Vascular: DP/PT pulses palpable bil.  No hair growth to digits both feet.  Mild to moderate edema with erythema R lateral foot with extension to dorsal foot.  Neuro: Light touch sensation reduced to digits both feet  Derm:  Pinpoint ulceration to the L IPJ hallux dorsal, measures 0.1 x 0.2 x 0.1cm, subQ depth, no SOI present  R 5th MTPJ ulceration plantar measures 0.4 x 0.2 x 1.5cm, probes to bone at the joint, blister like extension  with necrotic changes to skin noted to the lateral aspect of the 5th MTPJ, purulence expressible with associated erythema and edema.  Mild odor   MSK:  HT digits 1-5 bil.  POP to the R forefoot and lateral foot.  Results for orders placed or performed during the hospital encounter of 10/05/20 (from the past 48 hour(s))  Comprehensive metabolic panel     Status: Abnormal   Collection Time: 10/05/20  2:44 PM  Result Value Ref Range   Sodium 131 (L) 135 - 145 mmol/L   Potassium 4.0 3.5 - 5.1 mmol/L    Comment: HEMOLYSIS AT THIS LEVEL MAY AFFECT RESULT   Chloride  95 (L) 98 - 111 mmol/L   CO2 24 22 - 32 mmol/L   Glucose, Bld 324 (H) 70 - 99 mg/dL    Comment: Glucose reference range applies only to samples taken after fasting for at least 8 hours.   BUN 32 (H) 6 - 20 mg/dL   Creatinine, Ser 7.62 0.61 - 1.24 mg/dL   Calcium 8.3 (L) 8.9 - 10.3 mg/dL   Total Protein 7.3 6.5 - 8.1 g/dL   Albumin 3.5 3.5 - 5.0 g/dL   AST 25 15 - 41 U/L   ALT 21 0 - 44 U/L   Alkaline Phosphatase 57 38 - 126 U/L   Total Bilirubin 1.2 0.3 - 1.2 mg/dL   GFR, Estimated >83 >15 mL/min    Comment: (NOTE) Calculated using the CKD-EPI Creatinine Equation (2021)    Anion gap 12 5 - 15    Comment: Performed at [facility], [address] [place], [place] [address]  CBC with Differential     Status: Abnormal   Collection Time: 10/05/20  2:44 PM  Result Value Ref Range   WBC 15.3 (H) 4.0 - 10.5 K/uL   RBC 3.85 (L) 4.22 - 5.81 MIL/uL   Hemoglobin 12.4 (L) 13.0 - 17.0 g/dL   HCT 07.3 (L) 39 - 52 %   MCV 91.7 80.0 - 100.0 fL   MCH 32.2 26.0 - 34.0 pg   MCHC 35.1 30.0 - 36.0 g/dL   RDW 71.0 62.6 - 94.8 %   Platelets 212 150 - 400 K/uL   nRBC 0.0 0.0 - 0.2 %   Neutrophils Relative % 87 %   Neutro Abs 13.3 (H) 1.7 - 7.7 K/uL   Lymphocytes Relative 7 %   Lymphs Abs 1.1 0.7 - 4.0 K/uL   Monocytes Relative 5 %   Monocytes Absolute 0.8 0.1 - 1.0 K/uL   Eosinophils Relative 0 %   Eosinophils Absolute 0.0 0.0 - 0.5 K/uL   Basophils Relative 0 %   Basophils Absolute 0.0 0.0 - 0.1 K/uL   Immature Granulocytes 1 %   Abs Immature Granulocytes 0.09 (H) 0.00 - 0.07 K/uL    Comment: Performed at [facility], [address] [place], [place] [address]  Lactic acid, plasma     Status: None   Collection Time: 10/05/20  2:44 PM  Result Value Ref Range   Lactic Acid, Venous 1.4 0.5 - 1.9 mmol/L    Comment: Performed at [facility], [address] [place], [place] [address]  Sedimentation rate     Status: Abnormal   Collection Time: 10/05/20  2:44 PM   Result Value Ref Range   Sed Rate 87 (H) 0 - 20 mm/hr    Comment: Performed at [facility], [address], [place], [place] [address]  Respiratory Panel by RT PCR (  Flu A&B, Covid) - Nasopharyngeal Swab     Status: None   Collection Time: 10/05/20  2:44 PM   Specimen: Nasopharyngeal Swab  Result Value Ref Range   SARS Coronavirus 2 by RT PCR NEGATIVE NEGATIVE    Comment: (NOTE) SARS-CoV-2 target nucleic acids are NOT DETECTED.  The SARS-CoV-2 RNA is generally detectable in upper respiratoy specimens during the acute phase of infection. The lowest concentration of SARS-CoV-2 viral copies this assay can detect is 131 copies/mL. A negative result does not preclude SARS-Cov-2 infection and should not be used as the sole basis for treatment or other patient management decisions. A negative result may occur with  improper specimen collection/handling, submission of specimen other than nasopharyngeal swab, presence of viral mutation(s) within the areas targeted by this assay, and inadequate number of viral copies (<131 copies/mL). A negative result must be combined with clinical observations, patient history, and epidemiological information. The expected result is Negative.  Fact Sheet for Patients:  https://www.moore.com/  Fact Sheet for Healthcare Providers:  https://www.young.biz/  This test is no t yet approved or cleared by the [place] FDA and  has been authorized for detection and/or diagnosis of SARS-CoV-2 by FDA under an Emergency Use Authorization (EUA). This EUA will remain  in effect (meaning this test can be used) for the duration of the COVID-19 declaration under Section 564(b)(1) of the Act, 21 U.S.C. section 360bbb-3(b)(1), unless the authorization is terminated or revoked sooner.     Influenza A by PCR NEGATIVE NEGATIVE   Influenza B by PCR NEGATIVE NEGATIVE    Comment: (NOTE) The Xpert Xpress  SARS-CoV-2/FLU/RSV assay is intended as an aid in  the diagnosis of influenza from Nasopharyngeal swab specimens and  should not be used as a sole basis for treatment. Nasal washings and  aspirates are unacceptable for Xpert Xpress SARS-CoV-2/FLU/RSV  testing.  Fact Sheet for Patients: https://www.moore.com/  Fact Sheet for Healthcare Providers: https://www.young.biz/  This test is not yet approved or cleared by the [place] FDA and  has been authorized for detection and/or diagnosis of SARS-CoV-2 by  FDA under an Emergency Use Authorization (EUA). This EUA will remain  in effect (meaning this test can be used) for the duration of the  Covid-19 declaration under Section 564(b)(1) of the Act, 21  U.S.C. section 360bbb-3(b)(1), unless the authorization is  terminated or revoked. Performed at [facility], [address] [place], [place] [address]   Protime-INR     Status: Abnormal   Collection Time: 10/05/20  2:44 PM  Result Value Ref Range   Prothrombin Time 16.1 (H) 11.4 - 15.2 seconds   INR 1.3 (H) 0.8 - 1.2    Comment: (NOTE) INR goal varies based on device and disease states. Performed at [facility], [address] [place], [place] [address]   Magnesium     Status: None   Collection Time: 10/05/20  2:44 PM  Result Value Ref Range   Magnesium 2.0 1.7 - 2.4 mg/dL    Comment: Performed at [facility], [address] [place], [place] [address]  Urinalysis, Complete w Microscopic     Status: Abnormal   Collection Time: 10/05/20  5:20 PM  Result Value Ref Range   Color, Urine YELLOW (A) YELLOW   APPearance HAZY (A) CLEAR   Specific Gravity, Urine 1.020 1.005 - 1.030   pH 5.0 5.0 - 8.0   Glucose, UA >=500 (A) NEGATIVE mg/dL   Hgb urine dipstick NEGATIVE NEGATIVE   Bilirubin  Urine NEGATIVE NEGATIVE   Ketones, ur 5 (A) NEGATIVE mg/dL   Protein, ur 161100 (A) NEGATIVE mg/dL   Nitrite NEGATIVE  NEGATIVE   Leukocytes,Ua NEGATIVE NEGATIVE   RBC / HPF 0-5 0 - 5 RBC/hpf   WBC, UA 0-5 0 - 5 WBC/hpf   Bacteria, UA NONE SEEN NONE SEEN   Squamous Epithelial / LPF 0-5 0 - 5   Mucus PRESENT    Hyaline Casts, UA PRESENT     Comment: Performed at [facility], [address] [place], [place] [id number]  CBG monitoring, ED     Status: Abnormal   Collection Time: 10/05/20  5:41 PM  Result Value Ref Range   Glucose-Capillary 197 (H) 70 - 99 mg/dL    Comment: Glucose reference range applies only to samples taken after fasting for at least 8 hours.   DG Foot Complete Right  Result Date: 10/05/2020 CLINICAL DATA:  Right foot ulceration. EXAM: RIGHT FOOT COMPLETE - 3+ VIEW COMPARISON:  July 19, 2017. FINDINGS: There is no evidence of fracture or dislocation. There is no evidence of arthropathy. Moderate posterior calcaneal spurring is noted. No lytic destruction is seen to suggest osteomyelitis. Probable focal soft tissue swelling and ulceration is seen lateral to the fifth metatarsophalangeal joint. IMPRESSION: Probable focal soft tissue swelling and ulceration is seen lateral to the fifth metatarsophalangeal joint. No lytic destruction is seen to suggest osteomyelitis. Electronically Signed   By: Lupita RaiderJames  Green Jr M.D.   On: 10/05/2020 10:15    Blood pressure (!) 154/86, pulse 91, temperature 98.8 F (37.1 C), temperature source Oral, resp. rate 17, height 6' (1.829 m), weight 110.7 kg, SpO2 98 %.   Assessment 1. R 5th MTPJ likely septic joint with associated ulceration that probes to bone with likely osteomyelitis 2. R forefoot cellulitis 2/2 to above condition 3. DM2 uncontrolled with polyneuropathy  Plan -Pt seen and examined. -XR reviewed.  Large amount of ST swelling, no signs of osteomyelitis. -MRI ordered for R foot to assess for septic joint -Clinically L 5th MTPJ wound probes to bone with associated cellulitis and purulence. -Wound culture taken.  Order placed to send  culture -Dressed with betadine gauze, 4x4, abd, kerlix, ace wrap.  Orders placed. -Ordered surgical shoe.  PWB heel contact L. -Discussed with patient that he will likely need partial R 5th ray amputation and closure may not be possible due to amount of tissue loss and may require wound vac and LWC to heal.  Will address after MRI and prepare for surgery for Friday -Appreciate recs for IV Abx per medicine.  Culture pending.  [name] PosnerAndrew Kennan [name] 10/05/2020, 6:57 PM

## 2020-10-05 NOTE — Discharge Instructions (Addendum)
After conversing with [facility] wound care clinic they are advising we send you to the ER for evaluation, IV antibiotics, and possible admission.

## 2020-10-05 NOTE — Progress Notes (Signed)
CODE SEPSIS - PHARMACY COMMUNICATION  **Broad Spectrum Antibiotics should be administered within 1 hour of Sepsis diagnosis**  Time Code Sepsis Called/Page Received: 15:14  Antibiotics Ordered: Vancomycin  Time of 1st antibiotic administration: Vancomycin given at 15:10 prior to Code Sepsis   Additional action taken by pharmacy: n/a  If necessary, Name of Provider/Nurse Contacted: n/a    Foye Deer ,PharmD Clinical Pharmacist  10/05/2020  4:44 PM

## 2020-10-06 ENCOUNTER — Encounter: Payer: Self-pay | Admitting: Internal Medicine

## 2020-10-06 DIAGNOSIS — Z794 Long term (current) use of insulin: Secondary | ICD-10-CM

## 2020-10-06 DIAGNOSIS — E1140 Type 2 diabetes mellitus with diabetic neuropathy, unspecified: Secondary | ICD-10-CM

## 2020-10-06 LAB — CBC WITH DIFFERENTIAL/PLATELET
Abs Immature Granulocytes: 0.1 10*3/uL — ABNORMAL HIGH (ref 0.00–0.07)
Basophils Absolute: 0 10*3/uL (ref 0.0–0.1)
Basophils Relative: 0 %
Eosinophils Absolute: 0.1 10*3/uL (ref 0.0–0.5)
Eosinophils Relative: 1 %
HCT: 33.2 % — ABNORMAL LOW (ref 39.0–52.0)
Hemoglobin: 11.8 g/dL — ABNORMAL LOW (ref 13.0–17.0)
Immature Granulocytes: 1 %
Lymphocytes Relative: 13 %
Lymphs Abs: 1.7 10*3/uL (ref 0.7–4.0)
MCH: 32.7 pg (ref 26.0–34.0)
MCHC: 35.5 g/dL (ref 30.0–36.0)
MCV: 92 fL (ref 80.0–100.0)
Monocytes Absolute: 0.8 10*3/uL (ref 0.1–1.0)
Monocytes Relative: 6 %
Neutro Abs: 9.9 10*3/uL — ABNORMAL HIGH (ref 1.7–7.7)
Neutrophils Relative %: 79 %
Platelets: 185 10*3/uL (ref 150–400)
RBC: 3.61 MIL/uL — ABNORMAL LOW (ref 4.22–5.81)
RDW: 11.6 % (ref 11.5–15.5)
WBC: 12.6 10*3/uL — ABNORMAL HIGH (ref 4.0–10.5)
nRBC: 0 % (ref 0.0–0.2)

## 2020-10-06 LAB — COMPREHENSIVE METABOLIC PANEL
ALT: 17 U/L (ref 0–44)
AST: 19 U/L (ref 15–41)
Albumin: 3.1 g/dL — ABNORMAL LOW (ref 3.5–5.0)
Alkaline Phosphatase: 52 U/L (ref 38–126)
Anion gap: 10 (ref 5–15)
BUN: 17 mg/dL (ref 6–20)
CO2: 27 mmol/L (ref 22–32)
Calcium: 8.3 mg/dL — ABNORMAL LOW (ref 8.9–10.3)
Chloride: 98 mmol/L (ref 98–111)
Creatinine, Ser: 0.68 mg/dL (ref 0.61–1.24)
GFR, Estimated: >60 mL/min (ref >60)
Glucose, Bld: 170 mg/dL — ABNORMAL HIGH (ref 70–99)
Potassium: 3.7 mmol/L (ref 3.5–5.1)
Sodium: 135 mmol/L (ref 135–145)
Total Bilirubin: 1 mg/dL (ref 0.3–1.2)
Total Protein: 6.8 g/dL (ref 6.5–8.1)

## 2020-10-06 LAB — CBG MONITORING, ED
Glucose-Capillary: 140 mg/dL — ABNORMAL HIGH (ref 70–99)
Glucose-Capillary: 234 mg/dL — ABNORMAL HIGH (ref 70–99)

## 2020-10-06 LAB — MAGNESIUM: Magnesium: 1.8 mg/dL (ref 1.7–2.4)

## 2020-10-06 LAB — HIV ANTIBODY (ROUTINE TESTING W REFLEX): HIV Screen 4th Generation wRfx: NONREACTIVE

## 2020-10-06 LAB — HEMOGLOBIN A1C
Hgb A1c MFr Bld: 9.6 % — ABNORMAL HIGH (ref 4.8–5.6)
Hgb A1c MFr Bld: 9.6 % — ABNORMAL HIGH (ref 4.8–5.6)
Mean Plasma Glucose: 229 mg/dL
Mean Plasma Glucose: 229 mg/dL

## 2020-10-06 LAB — GLUCOSE, CAPILLARY
Glucose-Capillary: 276 mg/dL — ABNORMAL HIGH (ref 70–99)
Glucose-Capillary: 231 mg/dL — ABNORMAL HIGH (ref 70–99)

## 2020-10-06 MED ORDER — CHLORHEXIDINE GLUCONATE 4 % EX LIQD: Freq: Once | CUTANEOUS | Status: AC

## 2020-10-06 MED ORDER — POVIDONE-IODINE 10 % EX SWAB
2.0000 "application " | Freq: Once | CUTANEOUS | Status: AC
  Administered 2020-10-07: 2 via TOPICAL
  Filled 2020-10-06: qty 2

## 2020-10-06 MED ORDER — CHLORHEXIDINE GLUCONATE 4 % EX LIQD: 60.0000 mL | Freq: Once | CUTANEOUS | Status: DC

## 2020-10-06 MED ORDER — ENSURE PRE-SURGERY PO LIQD
296.0000 mL | Freq: Once | ORAL | Status: DC
  Filled 2020-10-06: qty 296

## 2020-10-06 NOTE — Progress Notes (Signed)
PODIATRY / FOOT AND ANKLE SURGERY PROGRESS NOTE  Requesting Physician: Terrilee Files, MD  Reason for consult: R 5th MTPJ ulceration/infection/cellulitis  Chief Complaint: R foot ulcer and infection   HPI: [name] is a 59 y.o. male who presents today resting in bed comfortably in the emergency room.  Patient has undergone MRI imaging and wants to discuss potential surgical plan today.  Patient states that he has some pain in the right foot.  Patient overall though is feeling a little bit better after receiving antibiotics.  PMHx:  Past Medical History:  Diagnosis Date  . Arthritis    hands  . COVID-19 12/29/2019  . Diabetes mellitus without complication (HCC)    type 2  . GERD (gastroesophageal reflux disease)   . Hypercholesterolemia   . Osteomyelitis of left foot [facility])     Surgical Hx:  Past Surgical History:  Procedure Laterality Date  . ADENOIDECTOMY    . CATARACT EXTRACTION W/PHACO Right 03/03/2020   Procedure: CATARACT EXTRACTION PHACO AND INTRAOCULAR LENS PLACEMENT (IOC) RIGHT DIABETIC VISION BLUE;  Surgeon: Elliot Cousin, MD;  Location[facility];  Service: Ophthalmology;  Laterality: Right;  COVID ( + ) 12-29-2019  30.97 02:27.7  . CATARACT EXTRACTION W/PHACO Left 06/23/2020   Procedure: CATARACT EXTRACTION PHACO AND INTRAOCULAR LENS PLACEMENT (IOC) LEFT DIABETIC VISION BLUE 5.14  00:34.7;  Surgeon: Elliot Cousin, MD;  Location: [facility];  Service: Ophthalmology;  Laterality: Left;  DIABETIC  . ELBOW FRACTURE SURGERY Right   . EXTERNAL EAR SURGERY      FHx:  Family History  Problem Relation Age of Onset  . Dementia Mother   . Hypertension Mother   . Diabetes Maternal Grandmother   . Hypertension Maternal Grandmother   . Cancer Neg Hx   . COPD Neg Hx   . Heart disease Neg Hx   . Stroke Neg Hx     Social History:  reports that he has never smoked. He has never used smokeless tobacco. He reports previous alcohol use. He reports that he  does not use drugs.  Allergies:  Allergies  Allergen Reactions  . Amoxicillin Rash    Review of Systems: General ROS: positive for  - fever Respiratory ROS: no cough, shortness of breath, or wheezing Cardiovascular ROS: no chest pain or dyspnea on exertion Gastrointestinal ROS: no abdominal pain, change in bowel habits, or black or bloody stools Musculoskeletal ROS: positive for - joint pain, joint stiffness and joint swelling Neurological ROS: positive for - numbness/tingling  (Not in a hospital admission)   Physical Exam: General: Alert and oriented.  No apparent distress.  Vascular: DP/PT pulses palpable bil.  No hair growth to digits both feet.  Moderate edema with erythema R lateral foot with extension to dorsal foot.  Neuro: Light touch sensation reduced to digits both feet  Derm:  Pinpoint ulceration to the L IPJ hallux dorsal, measures 0.1 x 0.2 x 0.1cm, subQ depth, no SOI present  R 5th MTPJ ulceration plantar measures 0.4 x 0.2 x 1.5cm, probes to bone at the joint, blister like extension with necrotic changes to skin noted to the lateral aspect of the 5th MTPJ, purulence expressible with associated erythema and edema.  Mild odor.  Blister like extension from the lateral fifth metatarsal phalangeal joint area was incised today and further purulence was able to be drained from the site.  MSK:  HT digits 1-5 bil.  POP to the R forefoot and lateral foot.  Results for orders placed or performed  during the hospital encounter of 10/05/20 (from the past 48 hour(s))  Comprehensive metabolic panel     Status: Abnormal   Collection Time: 10/05/20  2:44 PM  Result Value Ref Range   Sodium 131 (L) 135 - 145 mmol/L   Potassium 4.0 3.5 - 5.1 mmol/L    Comment: HEMOLYSIS AT THIS LEVEL MAY AFFECT RESULT   Chloride 95 (L) 98 - 111 mmol/L   CO2 24 22 - 32 mmol/L   Glucose, Bld 324 (H) 70 - 99 mg/dL    Comment: Glucose reference range applies only to samples taken after fasting for at  least 8 hours.   BUN 32 (H) 6 - 20 mg/dL   Creatinine, Ser 7.00 0.61 - 1.24 mg/dL   Calcium 8.3 (L) 8.9 - 10.3 mg/dL   Total Protein 7.3 6.5 - 8.1 g/dL   Albumin 3.5 3.5 - 5.0 g/dL   AST 25 15 - 41 U/L   ALT 21 0 - 44 U/L   Alkaline Phosphatase 57 38 - 126 U/L   Total Bilirubin 1.2 0.3 - 1.2 mg/dL   GFR, Estimated >17 >49 mL/min    Comment: (NOTE) Calculated using the CKD-EPI Creatinine Equation (2021)    Anion gap 12 5 - 15    Comment: Performed at [facility], [address] [place], [place] [address]  CBC with Differential     Status: Abnormal   Collection Time: 10/05/20  2:44 PM  Result Value Ref Range   WBC 15.3 (H) 4.0 - 10.5 K/uL   RBC 3.85 (L) 4.22 - 5.81 MIL/uL   Hemoglobin 12.4 (L) 13.0 - 17.0 g/dL   HCT 59.1 (L) 39 - 52 %   MCV 91.7 80.0 - 100.0 fL   MCH 32.2 26.0 - 34.0 pg   MCHC 35.1 30.0 - 36.0 g/dL   RDW 63.8 46.6 - 59.9 %   Platelets 212 150 - 400 K/uL   nRBC 0.0 0.0 - 0.2 %   Neutrophils Relative % 87 %   Neutro Abs 13.3 (H) 1.7 - 7.7 K/uL   Lymphocytes Relative 7 %   Lymphs Abs 1.1 0.7 - 4.0 K/uL   Monocytes Relative 5 %   Monocytes Absolute 0.8 0.1 - 1.0 K/uL   Eosinophils Relative 0 %   Eosinophils Absolute 0.0 0.0 - 0.5 K/uL   Basophils Relative 0 %   Basophils Absolute 0.0 0.0 - 0.1 K/uL   Immature Granulocytes 1 %   Abs Immature Granulocytes 0.09 (H) 0.00 - 0.07 K/uL    Comment: Performed at [name], [address] [place], [place] [address]  Lactic acid, plasma     Status: None   Collection Time: 10/05/20  2:44 PM  Result Value Ref Range   Lactic Acid, Venous 1.4 0.5 - 1.9 mmol/L    Comment: Performed at [facility], [address] [place], [place] [address]  Culture, blood (routine x 2)     Status: None (Preliminary result)   Collection Time: 10/05/20  2:44 PM   Specimen: BLOOD  Result Value Ref Range   Specimen Description BLOOD BLOOD LEFT FOREARM    Special Requests      BOTTLES DRAWN AEROBIC AND  ANAEROBIC Blood Culture results may not be optimal due to an inadequate volume of blood received in culture bottles   Culture      NO GROWTH < 24 HOURS Performed at [facility], [address], [place], [place] [address]    Report Status  PENDING   Culture, blood (routine x 2)     Status: None (Preliminary result)   Collection Time: 10/05/20  2:44 PM   Specimen: BLOOD  Result Value Ref Range   Specimen Description BLOOD BLOOD RIGHT FOREARM    Special Requests      BOTTLES DRAWN AEROBIC AND ANAEROBIC Blood Culture adequate volume   Culture      NO GROWTH < 24 HOURS Performed at [facility], [address] [place], [place] [address]    Report Status PENDING   C-reactive protein     Status: Abnormal   Collection Time: 10/05/20  2:44 PM  Result Value Ref Range   CRP 18.9 (H) <1.0 mg/dL    Comment: Performed at [facility], [address], [place], [place] [address]  Sedimentation rate     Status: Abnormal   Collection Time: 10/05/20  2:44 PM  Result Value Ref Range   Sed Rate 87 (H) 0 - 20 mm/hr    Comment: Performed at [facility], [address] [place], [place] [address]  Respiratory Panel by RT PCR (Flu A&B, Covid) - Nasopharyngeal Swab     Status: None   Collection Time: 10/05/20  2:44 PM   Specimen: Nasopharyngeal Swab  Result Value Ref Range   SARS Coronavirus 2 by RT PCR NEGATIVE NEGATIVE    Comment: (NOTE) SARS-CoV-2 target nucleic acids are NOT DETECTED.  The SARS-CoV-2 RNA is generally detectable in upper respiratoy specimens during the acute phase of infection. The lowest concentration of SARS-CoV-2 viral copies this assay can detect is 131 copies/mL. A negative result does not preclude SARS-Cov-2 infection and should not be used as the sole basis for treatment or other patient management decisions. A negative result may occur with  improper specimen collection/handling, submission of specimen other than nasopharyngeal swab,  presence of viral mutation(s) within the areas targeted by this assay, and inadequate number of viral copies (<131 copies/mL). A negative result must be combined with clinical observations, patient history, and epidemiological information. The expected result is Negative.  Fact Sheet for Patients:  https://www.moore.com/  Fact Sheet for Healthcare Providers:  https://www.young.biz/  This test is no t yet approved or cleared by the [place] FDA and  has been authorized for detection and/or diagnosis of SARS-CoV-2 by FDA under an Emergency Use Authorization (EUA). This EUA will remain  in effect (meaning this test can be used) for the duration of the COVID-19 declaration under Section 564(b)(1) of the Act, 21 U.S.C. section 360bbb-3(b)(1), unless the authorization is terminated or revoked sooner.     Influenza A by PCR NEGATIVE NEGATIVE   Influenza B by PCR NEGATIVE NEGATIVE    Comment: (NOTE) The Xpert Xpress SARS-CoV-2/FLU/RSV assay is intended as an aid in  the diagnosis of influenza from Nasopharyngeal swab specimens and  should not be used as a sole basis for treatment. Nasal washings and  aspirates are unacceptable for Xpert Xpress SARS-CoV-2/FLU/RSV  testing.  Fact Sheet for Patients: https://www.moore.com/  Fact Sheet for Healthcare Providers: https://www.young.biz/  This test is not yet approved or cleared by the [place] FDA and  has been authorized for detection and/or diagnosis of SARS-CoV-2 by  FDA under an Emergency Use Authorization (EUA). This EUA will remain  in effect (meaning this test can be used) for the duration of the  Covid-19 declaration under Section 564(b)(1) of the Act, 21  U.S.C. section 360bbb-3(b)(1), unless the authorization is  terminated or revoked. Performed at [facility], [address] [place], [place] [address]   Protime-INR     Status:  Abnormal   Collection Time: 10/05/20  2:44 PM  Result Value Ref Range   Prothrombin Time 16.1 (H) 11.4 - 15.2 seconds   INR 1.3 (H) 0.8 - 1.2    Comment: (NOTE) INR goal varies based on device and disease states. Performed at [facility], [address] [place], [place] [address]   Magnesium     Status: None   Collection Time: 10/05/20  2:44 PM  Result Value Ref Range   Magnesium 2.0 1.7 - 2.4 mg/dL    Comment: Performed at [facility], [address] [place], [place] [address]  Hemoglobin A1c     Status: Abnormal   Collection Time: 10/05/20  2:44 PM  Result Value Ref Range   Hgb A1c MFr Bld 9.6 (H) 4.8 - 5.6 %    Comment: (NOTE)         Prediabetes: 5.7 - 6.4         Diabetes: >6.4         Glycemic control for adults with diabetes: <7.0    Mean Plasma Glucose 229 mg/dL    Comment: (NOTE) Performed At: [facility] [address] [place], [place] [id number] Jolene Schimke MD OZ:[id number]   Urinalysis, Complete w Microscopic     Status: Abnormal   Collection Time: 10/05/20  5:20 PM  Result Value Ref Range   Color, Urine YELLOW (A) YELLOW   APPearance HAZY (A) CLEAR   Specific Gravity, Urine 1.020 1.005 - 1.030   pH 5.0 5.0 - 8.0   Glucose, UA >=500 (A) NEGATIVE mg/dL   Hgb urine dipstick NEGATIVE NEGATIVE   Bilirubin Urine NEGATIVE NEGATIVE   Ketones, ur 5 (A) NEGATIVE mg/dL   Protein, ur 629 (A) NEGATIVE mg/dL   Nitrite NEGATIVE NEGATIVE   Leukocytes,Ua NEGATIVE NEGATIVE   RBC / HPF 0-5 0 - 5 RBC/hpf   WBC, UA 0-5 0 - 5 WBC/hpf   Bacteria, UA NONE SEEN NONE SEEN   Squamous Epithelial / LPF 0-5 0 - 5   Mucus PRESENT    Hyaline Casts, UA PRESENT     Comment: Performed at [facility], [address] [place], [place] [address]  CBG monitoring, ED     Status: Abnormal   Collection Time: 10/05/20  5:41 PM  Result Value Ref Range   Glucose-Capillary 197 (H) 70 - 99 mg/dL    Comment: Glucose reference range applies only to  samples taken after fasting for at least 8 hours.  Aerobic/Anaerobic Culture (surgical/deep wound)     Status: None (Preliminary result)   Collection Time: 10/05/20  6:56 PM   Specimen: Wound  Result Value Ref Range   Specimen Description      WOUND Performed at [facility], [address], [place], [place] [address]    Special Requests      NONE Performed at [facility], [address] [place], [place] [address]    Gram Stain      NO WBC SEEN MODERATE GRAM POSITIVE COCCI Performed at [facility], [address], [place], [place] [address]    Culture PENDING    Report Status PENDING   CBG monitoring, ED     Status: Abnormal   Collection Time: 10/05/20 11:04 PM  Result Value Ref Range   Glucose-Capillary 214 (H) 70 - 99 mg/dL    Comment: Glucose reference range applies  only to samples taken after fasting for at least 8 hours.  HIV Antibody (routine testing w rflx)     Status: None   Collection Time: 10/06/20  4:40 AM  Result Value Ref Range   HIV Screen 4th Generation wRfx Non Reactive Non Reactive    Comment: Performed at [facility], [address], [place], [place] [address]  CBC with Differential/Platelet     Status: Abnormal   Collection Time: 10/06/20  4:40 AM  Result Value Ref Range   WBC 12.6 (H) 4.0 - 10.5 K/uL   RBC 3.61 (L) 4.22 - 5.81 MIL/uL   Hemoglobin 11.8 (L) 13.0 - 17.0 g/dL   HCT 60.4 (L) 39 - 52 %   MCV 92.0 80.0 - 100.0 fL   MCH 32.7 26.0 - 34.0 pg   MCHC 35.5 30.0 - 36.0 g/dL   RDW 54.0 98.1 - 19.1 %   Platelets 185 150 - 400 K/uL   nRBC 0.0 0.0 - 0.2 %   Neutrophils Relative % 79 %   Neutro Abs 9.9 (H) 1.7 - 7.7 K/uL   Lymphocytes Relative 13 %   Lymphs Abs 1.7 0.7 - 4.0 K/uL   Monocytes Relative 6 %   Monocytes Absolute 0.8 0.1 - 1.0 K/uL   Eosinophils Relative 1 %   Eosinophils Absolute 0.1 0.0 - 0.5 K/uL   Basophils Relative 0 %   Basophils Absolute 0.0 0.0 - 0.1 K/uL   Immature Granulocytes 1 %   Abs  Immature Granulocytes 0.10 (H) 0.00 - 0.07 K/uL    Comment: Performed at [facility], [address] [place], [place] [address]  Comprehensive metabolic panel     Status: Abnormal   Collection Time: 10/06/20  4:40 AM  Result Value Ref Range   Sodium 135 135 - 145 mmol/L   Potassium 3.7 3.5 - 5.1 mmol/L   Chloride 98 98 - 111 mmol/L   CO2 27 22 - 32 mmol/L   Glucose, Bld 170 (H) 70 - 99 mg/dL    Comment: Glucose reference range applies only to samples taken after fasting for at least 8 hours.   BUN 17 6 - 20 mg/dL   Creatinine, Ser 5.62 0.61 - 1.24 mg/dL   Calcium 8.3 (L) 8.9 - 10.3 mg/dL   Total Protein 6.8 6.5 - 8.1 g/dL   Albumin 3.1 (L) 3.5 - 5.0 g/dL   AST 19 15 - 41 U/L   ALT 17 0 - 44 U/L   Alkaline Phosphatase 52 38 - 126 U/L   Total Bilirubin 1.0 0.3 - 1.2 mg/dL   GFR, Estimated >13 >08 mL/min    Comment: (NOTE) Calculated using the CKD-EPI Creatinine Equation (2021)    Anion gap 10 5 - 15    Comment: Performed at [facility], [address] [place], [place] [address]  Magnesium     Status: None   Collection Time: 10/06/20  4:40 AM  Result Value Ref Range   Magnesium 1.8 1.7 - 2.4 mg/dL    Comment: Performed at [facility], [address] [place], [place] [address]  CBG monitoring, ED     Status: Abnormal   Collection Time: 10/06/20  8:14 AM  Result Value Ref Range   Glucose-Capillary 140 (H) 70 - 99 mg/dL    Comment: Glucose reference range applies only to samples taken after fasting for at least 8 hours.   Comment 1 Notify RN    MR FOOT RIGHT WO CONTRAST  Result Date: 10/05/2020 CLINICAL DATA:  Focal soft tissue swelling and ulceration along the lateral fifth metatarsal phalangeal joint. EXAM: MRI OF THE RIGHT FOREFOOT WITHOUT CONTRAST TECHNIQUE: Multiplanar, multisequence MR imaging of the right was performed. No intravenous contrast was administered. COMPARISON:  None. FINDINGS: Bones/Joint/Cartilage Mildly increased T2  hyperintense signal with question of minimally decreased T1 hypointensity seen within the fifth metatarsal head. No other areas of cortical destruction or periosteal reaction. There is mildly increased T2 hyperintense signal seen also at the base of the fifth proximal phalanx. Joint space loss with subchondral cystic changes are seen at the fifth and fourth metatarsal base cuboid articulation. Ligaments The Lisfranc ligaments are intact. Muscles and Tendons There is increased feathery signal seen throughout the muscles of the forefoot with mild atrophy. The flexor and extensor tendons are intact. Soft tissues There is a focal area of ulceration seen on the plantar surface of the fifth metatarsal head measuring approximately 5 mm in transverse dimension. There is a fluid-filled sinus tract seen at this level. Non loculated fluid is seen surrounding the base of the fifth metatarsal head. There is diffuse overlying skin thickening and subcutaneous edema. There is also a multilocular small fluid collection seen along the lateral aspect of the fifth metatarsal head measuring approximately 1.5 x 0.8 cm. Diffuse overlying dorsal subcutaneous edema and skin thickening is seen. IMPRESSION: Area of ulceration seen on the lateral plantar base of the fifth metatarsal head with a sinus tract and probable small soft tissue abscess measuring 1.5 x 0.8 cm. Findings which could be suggestive of early osteomyelitis of the fifth metatarsal head. Probable reactive marrow at the fifth proximal phalanx. Electronically Signed   By: Jonna ClarkBindu  Avutu M.D.   On: 10/05/2020 21:07   DG Foot Complete Right  Result Date: 10/05/2020 CLINICAL DATA:  Right foot ulceration. EXAM: RIGHT FOOT COMPLETE - 3+ VIEW COMPARISON:  July 19, 2017. FINDINGS: There is no evidence of fracture or dislocation. There is no evidence of arthropathy. Moderate posterior calcaneal spurring is noted. No lytic destruction is seen to suggest osteomyelitis. Probable focal  soft tissue swelling and ulceration is seen lateral to the fifth metatarsophalangeal joint. IMPRESSION: Probable focal soft tissue swelling and ulceration is seen lateral to the fifth metatarsophalangeal joint. No lytic destruction is seen to suggest osteomyelitis. Electronically Signed   By: Lupita RaiderJames  Green Jr M.D.   On: 10/05/2020 10:15    Blood pressure 103/65, pulse 77, temperature 97.9 F (36.6 C), temperature source Oral, resp. rate 14, height 6' (1.829 m), weight 110.7 kg, SpO2 94 %.   Assessment 1. 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 R forefoot cellulitis 2/2 to above condition 3. DM2 uncontrolled with polyneuropathy  Plan -Pt seen and examined. -XR reviewed.  Large amount of ST swelling, no signs of osteomyelitis. -MRI reviewed and discussed with patient in detail.  Appears to show osteomyelitic changes to the fifth metatarsal phalangeal joint with abscess present over the dorsal lateral aspect of the foot. -Verbal consent was obtained for bedside incision and drainage.  Cleansed the right dorsal lateral fifth metatarsal phalangeal joint area off with Betadine.  Made a small stab incision over this area with 15 blade.  Used dissection scissors to open the area up and drained approximately 5 to 10 cc of purulence from the area. -Wound culture pending, growing gram-positive cocci currently -Dressed with betadine gauze, 4x4, abd, kerlix, ace wrap.  Left hallux Betadine and Band-Aid daily.  Orders placed. -Ordered surgical shoe.  PWB heel contact  L. -Appreciate recs for IV Abx per medicine.  Culture pending. -Discussed all treatment options with the patient both conservative and surgical attempts at correction include potential risks and complications.  At this time patient is elected for procedure consisting of right partial fifth ray amputation with incision and drainage with removal of all nonviable necrotic tissues. -Patient to be n.p.o. at 8 AM on 10/07/20 for surgical procedure at  some point in the late afternoon.  Patient agreeable to plan.  Rosetta Posner, DPM 10/06/2020, 12:07 PM

## 2020-10-06 NOTE — Progress Notes (Signed)
Inpatient Diabetes Program Recommendations  AACE/[name]: New Consensus Statement on Inpatient Glycemic Control   Target Ranges:  Prepandial:   less than 140 mg/dL      Peak postprandial:   less than 180 mg/dL (1-2 hours)      Critically ill patients:  140 - 180 mg/dL  Results for [name], [name] (MRN [id number]) as of 10/06/2020 09:42  Ref. Range 10/05/2020 09:26 10/05/2020 17:41 10/05/2020 23:04 10/06/2020 08:14  Glucose-Capillary Latest Ref Range: 70 - 99 mg/dL 241 (H) 197 (H) 214 (H) 140 (H)   Results for [name], [name] (MRN [id number]) as of 10/06/2020 09:42  Ref. Range 02/08/2020 16:12 10/05/2020 14:44  Hemoglobin A1C Latest Ref Range: 4.8 - 5.6 % 7.2 (H) 9.6 (H)   Review of Glycemic Control  Diabetes history: DM2 Outpatient Diabetes medications: Tresiba 18 units QHS, Glipizide 5 mg BID, Metformin 1000 mg BID Current orders for Inpatient glycemic control: Levemir 8 units QHS, Novolog 0-9 units TID with meals  Inpatient Diabetes Program Recommendations:    HbgA1C: A1C 9.6% on 10/05/20 indicating an average glucose of 229 mg/dl over the past 2-3 months. Patient reports that he has not been taking DM medications as prescribed and plans to get back on track and follow up with PCP.  NOTE: Spoke with patient about diabetes and home regimen for diabetes control. Patient reports being followed by PCP for diabetes management and last seen PCP in June 2021. Patient reports he is Tresiba 18 units QHS, Glipizide 5 mg BID, and Metformin 1000 mg BID as an outpatient for diabetes control. Patient reports that he has not been taking DM medications as prescribed lately but he notes he plans to get back on track with taking them consistently. Patient reports that he is not checking glucose at home at all. He states he has a glucometer and testing supplies but he will have to try to find them.  Will ask discharging provider to provide Rx for glucose monitoring kit (#[id number]).  Discussed A1C results (9.6% on  10/05/20) and explained that current A1C indicates an average glucose of 229 mg/dl over the past 2-3 months. Patient notes that his prior A1C in June was 7.3% and that his A1C increase is mostly due to him not taking DM medications consistently as prescribed.  Discussed glucose and A1C goals. Discussed importance of checking CBGs and maintaining good CBG control to prevent long-term and short-term complications. Explained how hyperglycemia leads to damage within blood vessels which lead to the common complications seen with uncontrolled diabetes. Stressed to the patient the importance of improving glycemic control to prevent further complications from uncontrolled diabetes.  Asked patient to start checking glucose at home, to take DM medications consistently as prescribed, and to follow up with PCP regarding DM management. Patient verbalized understanding of information discussed and reports no further questions at this time related to diabetes.  At time of discharge, please provide Rx for glucose monitoring kit (#[id number]).  Thanks, Barnie Alderman, RN, MSN, CDE Diabetes Coordinator Inpatient Diabetes Program [phone] (Team Pager)

## 2020-10-06 NOTE — Progress Notes (Signed)
[facility] PROGRESS NOTE   Kiran Carline ERX:[id number] DOB: 03-07-1961 DOA: 10/05/2020  PCP: Patient, No Pcp Per  Brief History/Interval Summary: 59 y.o. male with medical history significant for recurrent diabetic foot ulcers, osteomyelitis of the left foot, type 2 diabetes mellitus with most recent hemoglobin A1c 7.3% in June 2021, hyperlipidemia, who was admitted to [facility] on 10/05/2020 with sepsis due to right lower extremity cellulitis after presenting from home to [facility] complaining of right foot erythema.   Patient was seen by podiatry.  Plan is for surgical intervention on Friday.  Patient underwent MRI of the right foot.   Reason for Visit: Right foot cellulitis and concern for osteomyelitis of the fifth metatarsal  Consultants: Podiatry, Dr. Luana Shu  Procedures: None yet  Antibiotics: Anti-infectives (From admission, onward)   Start     Dose/Rate Route Frequency Ordered Stop   10/06/20 0600  vancomycin (VANCOCIN) IVPB 1000 mg/200 mL premix        1,000 mg 200 mL/hr over 60 Minutes Intravenous Every 12 hours 10/05/20 2026     10/05/20 1530  vancomycin (VANCOREADY) IVPB 2000 mg/400 mL        2,000 mg 200 mL/hr over 120 Minutes Intravenous  Once 10/05/20 1445 10/05/20 1718   10/05/20 1445  vancomycin (VANCOREADY) IVPB 1750 mg/350 mL  Status:  Discontinued        1,750 mg 175 mL/hr over 120 Minutes Intravenous  Once 10/05/20 1432 10/05/20 1443      Subjective/Interval History: Patient denies any significant pain in the right foot area.  Currently about 4 out of 10 in intensity.  Denies any shortness of breath.  No nausea vomiting.    Assessment/Plan:  Cellulitis of the right foot with concern for osteomyelitis of the fifth metatarsal with concern also for small abscess/sepsis, present on admission Patient had SIRS criteria at admission including tachycardia leukocytosis.  Patient noted to have erythema  of the right foot.  MRI raises concern for a small abscess concern for osteomyelitis involving the metatarsal head.  Patient started on vancomycin.  Seen by podiatry.  Dressing change was done in the ER yesterday.  Currently his right foot is covered in dressing.  Based on podiatry notes it appears that plan is for surgical intervention on Friday.   Follow-up on cultures.  WBC is better today.  ESR noted to be 87.  CRP noted to be 18.9.  Lactic acid level was normal.  Wound culture showed moderate gram-positive cocci.  Mild dehydration/prerenal azotemia Patient had mildly elevated BUN.  This is improved with IV hydration.  Diabetes mellitus type 2, uncontrolled with hyperglycemia HbA1c was 7.3 in June.  Noted to be 9.6 during this hospital stay.  Patient on glipizide Metformin and Tresiba at home.  Currently on Levemir and SSI.  Monitor CBGs.  Hyperlipidemia Continue Lipitor.  Normocytic anemia Likely due to acute illness.  No evidence for acute loss.  Obesity Estimated body mass index is 33.09 kg/m as calculated from the following:   Height as of this encounter: 6' (1.829 m).   Weight as of this encounter: 110.7 kg.   DVT Prophylaxis: Lovenox Code Status: FullCode Family Communication: Discussed with patient Disposition Plan: Hopefully return home when improved  Status is: Inpatient  Remains inpatient appropriate because:IV treatments appropriate due to intensity of illness or inability to take PO and Inpatient level of care appropriate due to severity of illness   Dispo: The patient is from: Home  Anticipated d/c is to: Home              Anticipated d/c date is: 2 days              Patient currently is not medically stable to d/c.    Medications:  Scheduled: . atorvastatin  20 mg Oral QODAY  . enoxaparin (LOVENOX) injection  0.5 mg/kg Subcutaneous Q24H  . insulin aspart  0-9 Units Subcutaneous TID WC  . insulin detemir  8 Units Subcutaneous QHS    Continuous: . lactated ringers 150 mL/hr at 10/06/20 0118  . vancomycin Stopped (10/06/20 0622)   MVV:[id number] **OR** acetaminophen, fentaNYL (SUBLIMAZE) injection, melatonin, ondansetron **OR** ondansetron (ZOFRAN) IV   Objective:  Vital Signs  Vitals:   10/05/20 2300 10/06/20 0000 10/06/20 0400 10/06/20 0600  BP: (!) 168/91 (!) 161/68 (!) 147/83 127/62  Pulse: (!) 104 100 84 78  Resp:  '16 16 14  ' Temp:      TempSrc:      SpO2: 95% 95% 93% 93%  Weight:      Height:        Intake/Output Summary (Last 24 hours) at 10/06/2020 0925 Last data filed at 10/06/2020 0750 Gross per 24 hour  Intake --  Output 800 ml  Net -800 ml   Filed Weights   10/05/20 1240  Weight: 110.7 kg    General appearance: Awake alert.  In no distress Resp: Clear to auscultation bilaterally.  Normal effort Cardio: S1-S2 is normal regular.  No S3-S4.  No rubs murmurs or bruit GI: Abdomen is soft.  Nontender nondistended.  Bowel sounds are present normal.  No masses organomegaly Extremities: Right foot covered in dressing.  Good pulses noted over the left foot. Neurologic: Alert and oriented x3.  No focal neurological deficits.    Lab Results:  Data Reviewed: I have personally reviewed following labs and imaging studies  CBC: Recent Labs  Lab 10/05/20 1444 10/06/20 0440  WBC 15.3* 12.6*  NEUTROABS 13.3* 9.9*  HGB 12.4* 11.8*  HCT 35.3* 33.2*  MCV 91.7 92.0  PLT 212 110    Basic Metabolic Panel: Recent Labs  Lab 10/05/20 1444 10/06/20 0440  NA 131* 135  K 4.0 3.7  CL 95* 98  CO2 24 27  GLUCOSE 324* 170*  BUN 32* 17  CREATININE 1.06 0.68  CALCIUM 8.3* 8.3*  MG 2.0 1.8    GFR: Estimated Creatinine Clearance: 130.8 mL/min (by C-G formula based on SCr of 0.68 mg/dL).  Liver Function Tests: Recent Labs  Lab 10/05/20 1444 10/06/20 0440  AST 25 19  ALT 21 17  ALKPHOS 57 52  BILITOT 1.2 1.0  PROT 7.3 6.8  ALBUMIN 3.5 3.1*     Coagulation Profile: Recent Labs   Lab 10/05/20 1444  INR 1.3*    HbA1C: Recent Labs    10/05/20 1444  HGBA1C 9.6*    CBG: Recent Labs  Lab 10/05/20 0926 10/05/20 1741 10/05/20 2304 10/06/20 0814  GLUCAP 241* 197* 214* 140*     Recent Results (from the past 240 hour(s))  Aerobic Culture (superficial specimen)     Status: None (Preliminary result)   Collection Time: 10/05/20  9:52 AM   Specimen: Foot; Wound  Result Value Ref Range Status   Specimen Description   Final    FOOT RIGHT Performed at [facility], [address], [place], [place] [address]    Special Requests   Final    Normal Performed at [facility], [address] [place], [place] [address]    Gram Stain   Final    FEW WBC PRESENT, PREDOMINANTLY PMN FEW GRAM POSITIVE COCCI IN CLUSTERS Performed at [facility], [place] [address] [place], [place] [address]    Culture MODERATE STAPHYLOCOCCUS AUREUS  Final   Report Status PENDING  Incomplete  Aerobic Culture (superficial specimen)     Status: None (Preliminary result)   Collection Time: 10/05/20  9:52 AM   Specimen: Foot; Wound  Result Value Ref Range Status   Specimen Description   Final    FOOT RIGHT SOLE Performed at [facility], [address] [place], [place] [address]    Special Requests   Final    Normal Performed at [facility] [place] [place], [place] [address]    Gram Stain   Final    RARE WBC PRESENT, PREDOMINANTLY PMN FEW GRAM POSITIVE COCCI IN PAIRS IN CLUSTERS    Culture   Final    MODERATE STAPHYLOCOCCUS AUREUS SUSCEPTIBILITIES TO FOLLOW Performed at [facility], [place] [address], [place], [place] [address]    Report Status PENDING  Incomplete  Culture, blood (routine x 2)     Status: None (Preliminary result)   Collection Time: 10/05/20  2:44 PM   Specimen: BLOOD  Result Value Ref Range Status   Specimen Description BLOOD BLOOD LEFT FOREARM  Final   Special Requests   Final     BOTTLES DRAWN AEROBIC AND ANAEROBIC Blood Culture results may not be optimal due to an inadequate volume of blood received in culture bottles   Culture   Final    NO GROWTH < 24 HOURS Performed at [facility], [address] [place], [place] [address]    Report Status PENDING  Incomplete  Culture, blood (routine x 2)     Status: None (Preliminary result)   Collection Time: 10/05/20  2:44 PM   Specimen: BLOOD  Result Value Ref Range Status   Specimen Description BLOOD BLOOD RIGHT FOREARM  Final   Special Requests   Final    BOTTLES DRAWN AEROBIC AND ANAEROBIC Blood Culture adequate volume   Culture   Final    NO GROWTH < 24 HOURS Performed at [facility], [address] [place], [place] [address]    Report Status PENDING  Incomplete  Respiratory Panel by RT PCR (Flu A&B, Covid) - Nasopharyngeal Swab     Status: None   Collection Time: 10/05/20  2:44 PM   Specimen: Nasopharyngeal Swab  Result Value Ref Range Status   SARS Coronavirus 2 by RT PCR NEGATIVE NEGATIVE Final    Comment: (NOTE) SARS-CoV-2 target nucleic acids are NOT DETECTED.  The SARS-CoV-2 RNA is generally detectable in upper respiratoy specimens during the acute phase of infection. The lowest concentration of SARS-CoV-2 viral copies this assay can detect is 131 copies/mL. A negative result does not preclude SARS-Cov-2 infection and should not be used as the sole basis for treatment or other patient management decisions. A negative result may occur with  improper specimen collection/handling, submission of specimen other than nasopharyngeal swab, presence of viral mutation(s) within the areas targeted by this assay, and inadequate number of viral copies (<131 copies/mL). A negative result must be combined with clinical observations, patient history, and epidemiological information. The expected result is Negative.  Fact Sheet for Patients:  PinkCheek.be  Fact  Sheet for Healthcare Providers:  GravelBags.it  This test is no t yet  approved or cleared by the [place] and  has been authorized for detection and/or diagnosis of SARS-CoV-2 by FDA under an Emergency Use Authorization (EUA). This EUA will remain  in effect (meaning this test can be used) for the duration of the COVID-19 declaration under Section 564(b)(1) of the Act, 21 U.S.C. section 360bbb-3(b)(1), unless the authorization is terminated or revoked sooner.     Influenza A by PCR NEGATIVE NEGATIVE Final   Influenza B by PCR NEGATIVE NEGATIVE Final    Comment: (NOTE) The Xpert Xpress SARS-CoV-2/FLU/RSV assay is intended as an aid in  the diagnosis of influenza from Nasopharyngeal swab specimens and  should not be used as a sole basis for treatment. Nasal washings and  aspirates are unacceptable for Xpert Xpress SARS-CoV-2/FLU/RSV  testing.  Fact Sheet for Patients: PinkCheek.be  Fact Sheet for Healthcare Providers: GravelBags.it  This test is not yet approved or cleared by the [place] FDA and  has been authorized for detection and/or diagnosis of SARS-CoV-2 by  FDA under an Emergency Use Authorization (EUA). This EUA will remain  in effect (meaning this test can be used) for the duration of the  Covid-19 declaration under Section 564(b)(1) of the Act, 21  U.S.C. section 360bbb-3(b)(1), unless the authorization is  terminated or revoked. Performed at [facility], [address] [place], [place] [address]   Aerobic/Anaerobic Culture (surgical/deep wound)     Status: None (Preliminary result)   Collection Time: 10/05/20  6:56 PM   Specimen: Wound  Result Value Ref Range Status   Specimen Description   Final    WOUND Performed at [facility], [address] [place], [place] [address]    Special Requests   Final    NONE Performed at [facility], [address] [place], [place] [address]    Gram Stain   Final    NO WBC SEEN MODERATE GRAM POSITIVE COCCI Performed at [facility], [place] [address] [place], [place] [address]    Culture PENDING  Incomplete   Report Status PENDING  Incomplete      Radiology Studies: MR FOOT RIGHT WO CONTRAST  Result Date: 10/05/2020 CLINICAL DATA:  Focal soft tissue swelling and ulceration along the lateral fifth metatarsal phalangeal joint. EXAM: MRI OF THE RIGHT FOREFOOT WITHOUT CONTRAST TECHNIQUE: Multiplanar, multisequence MR imaging of the right was performed. No intravenous contrast was administered. COMPARISON:  None. FINDINGS: Bones/Joint/Cartilage Mildly increased T2 hyperintense signal with question of minimally decreased T1 hypointensity seen within the fifth metatarsal head. No other areas of cortical destruction or periosteal reaction. There is mildly increased T2 hyperintense signal seen also at the base of the fifth proximal phalanx. Joint space loss with subchondral cystic changes are seen at the fifth and fourth metatarsal base cuboid articulation. Ligaments The Lisfranc ligaments are intact. Muscles and Tendons There is increased feathery signal seen throughout the muscles of the forefoot with mild atrophy. The flexor and extensor tendons are intact. Soft tissues There is a focal area of ulceration seen on the plantar surface of the fifth metatarsal head measuring approximately 5 mm in transverse dimension. There is a fluid-filled sinus tract seen at this level. Non loculated fluid is seen surrounding the base of the fifth metatarsal head. There is diffuse overlying skin thickening and subcutaneous edema. There is also a multilocular small fluid collection seen along the lateral aspect of the fifth metatarsal head measuring approximately 1.5 x 0.8 cm. Diffuse overlying dorsal subcutaneous edema and  skin thickening is seen. IMPRESSION: Area of ulceration seen on the lateral plantar  base of the fifth metatarsal head with a sinus tract and probable small soft tissue abscess measuring 1.5 x 0.8 cm. Findings which could be suggestive of early osteomyelitis of the fifth metatarsal head. Probable reactive marrow at the fifth proximal phalanx. Electronically Signed   By: Prudencio Pair M.D.   On: 10/05/2020 21:07   DG Foot Complete Right  Result Date: 10/05/2020 CLINICAL DATA:  Right foot ulceration. EXAM: RIGHT FOOT COMPLETE - 3+ VIEW COMPARISON:  July 19, 2017. FINDINGS: There is no evidence of fracture or dislocation. There is no evidence of arthropathy. Moderate posterior calcaneal spurring is noted. No lytic destruction is seen to suggest osteomyelitis. Probable focal soft tissue swelling and ulceration is seen lateral to the fifth metatarsophalangeal joint. IMPRESSION: Probable focal soft tissue swelling and ulceration is seen lateral to the fifth metatarsophalangeal joint. No lytic destruction is seen to suggest osteomyelitis. Electronically Signed   By: Marijo Conception M.D.   On: 10/05/2020 10:15       LOS: 1 day   [facility] Pager on [email or web address]  10/06/2020, 9:25 AM

## 2020-10-06 NOTE — Progress Notes (Signed)
Report called to Emily on 1C 

## 2020-10-07 ENCOUNTER — Inpatient Hospital Stay: Payer: BC Managed Care – PPO | Admitting: Anesthesiology

## 2020-10-07 ENCOUNTER — Encounter
Admission: EM | Disposition: A | Discharge status: Home / Self Care | Payer: Self-pay | Source: Home / Self Care | Attending: Internal Medicine

## 2020-10-07 ENCOUNTER — Inpatient Hospital Stay: Payer: BC Managed Care – PPO

## 2020-10-07 ENCOUNTER — Encounter: Payer: Self-pay | Admitting: Internal Medicine

## 2020-10-07 DIAGNOSIS — S91301A Unspecified open wound, right foot, initial encounter: Secondary | ICD-10-CM

## 2020-10-07 HISTORY — PX: AMPUTATION: SHX166

## 2020-10-07 HISTORY — PX: IRRIGATION AND DEBRIDEMENT FOOT: SHX6602

## 2020-10-07 LAB — CBC
HCT: 32.4 % — ABNORMAL LOW (ref 39.0–52.0)
Hemoglobin: 11.3 g/dL — ABNORMAL LOW (ref 13.0–17.0)
MCH: 31.9 pg (ref 26.0–34.0)
MCHC: 34.9 g/dL (ref 30.0–36.0)
MCV: 91.5 fL (ref 80.0–100.0)
Platelets: 209 10*3/uL (ref 150–400)
RBC: 3.54 MIL/uL — ABNORMAL LOW (ref 4.22–5.81)
RDW: 11.5 % (ref 11.5–15.5)
WBC: 11.9 10*3/uL — ABNORMAL HIGH (ref 4.0–10.5)
nRBC: 0 % (ref 0.0–0.2)

## 2020-10-07 LAB — AEROBIC CULTURE W GRAM STAIN (SUPERFICIAL SPECIMEN)
Special Requests: NORMAL
Special Requests: NORMAL

## 2020-10-07 LAB — BASIC METABOLIC PANEL
Anion gap: 8 (ref 5–15)
BUN: 14 mg/dL (ref 6–20)
CO2: 29 mmol/L (ref 22–32)
Calcium: 8.4 mg/dL — ABNORMAL LOW (ref 8.9–10.3)
Chloride: 98 mmol/L (ref 98–111)
Creatinine, Ser: 0.64 mg/dL (ref 0.61–1.24)
GFR, Estimated: >60 mL/min (ref >60)
Glucose, Bld: 199 mg/dL — ABNORMAL HIGH (ref 70–99)
Potassium: 3.8 mmol/L (ref 3.5–5.1)
Sodium: 135 mmol/L (ref 135–145)

## 2020-10-07 LAB — GLUCOSE, CAPILLARY
Glucose-Capillary: 200 mg/dL — ABNORMAL HIGH (ref 70–99)
Glucose-Capillary: 179 mg/dL — ABNORMAL HIGH (ref 70–99)
Glucose-Capillary: 124 mg/dL — ABNORMAL HIGH (ref 70–99)
Glucose-Capillary: 138 mg/dL — ABNORMAL HIGH (ref 70–99)
Glucose-Capillary: 223 mg/dL — ABNORMAL HIGH (ref 70–99)

## 2020-10-07 SURGERY — AMPUTATION, FOOT, RAY
Anesthesia: General | Laterality: Right

## 2020-10-07 MED ORDER — VASOPRESSIN 20 UNIT/ML IV SOLN
INTRAVENOUS | Status: AC
  Filled 2020-10-07: qty 1

## 2020-10-07 MED ORDER — BUPIVACAINE HCL (PF) 0.5 % IJ SOLN
INTRAMUSCULAR | Status: AC
  Filled 2020-10-07: qty 30

## 2020-10-07 MED ORDER — ASCORBIC ACID 500 MG PO TABS
250.0000 mg | ORAL_TABLET | Freq: Two times a day (BID) | ORAL | Status: DC
  Administered 2020-10-08 – 2020-10-10 (×5): 250 mg via ORAL
  Filled 2020-10-07 (×5): qty 1

## 2020-10-07 MED ORDER — LIDOCAINE HCL (CARDIAC) PF 100 MG/5ML IV SOSY
PREFILLED_SYRINGE | INTRAVENOUS | Status: DC | PRN
  Administered 2020-10-07: 100 mg via INTRAVENOUS

## 2020-10-07 MED ORDER — FENTANYL CITRATE (PF) 100 MCG/2ML IJ SOLN
INTRAMUSCULAR | Status: AC
  Filled 2020-10-07: qty 2

## 2020-10-07 MED ORDER — SODIUM CHLORIDE 0.9 % IV SOLN: INTRAVENOUS | Status: DC | PRN

## 2020-10-07 MED ORDER — ACETAMINOPHEN 10 MG/ML IV SOLN: 1000.0000 mg | Freq: Once | INTRAVENOUS | Status: DC | PRN

## 2020-10-07 MED ORDER — FENTANYL CITRATE (PF) 100 MCG/2ML IJ SOLN
INTRAMUSCULAR | Status: DC | PRN
  Administered 2020-10-07 (×2): 50 ug via INTRAVENOUS

## 2020-10-07 MED ORDER — LIDOCAINE HCL (PF) 1 % IJ SOLN
INTRAMUSCULAR | Status: AC
  Filled 2020-10-07: qty 30

## 2020-10-07 MED ORDER — MIDAZOLAM HCL 2 MG/2ML IJ SOLN
INTRAMUSCULAR | Status: AC
  Filled 2020-10-07: qty 2

## 2020-10-07 MED ORDER — SODIUM CHLORIDE 0.9 % IV SOLN: Freq: Once | INTRAVENOUS | Status: AC

## 2020-10-07 MED ORDER — OXYCODONE HCL 5 MG PO TABS: 5.0000 mg | ORAL_TABLET | Freq: Once | ORAL | Status: DC | PRN

## 2020-10-07 MED ORDER — DEXAMETHASONE SODIUM PHOSPHATE 10 MG/ML IJ SOLN
INTRAMUSCULAR | Status: DC | PRN
  Administered 2020-10-07: 8 mg via INTRAVENOUS

## 2020-10-07 MED ORDER — VANCOMYCIN HCL 1000 MG IV SOLR
INTRAVENOUS | Status: DC | PRN
  Administered 2020-10-07: 1000 mg

## 2020-10-07 MED ORDER — ADULT MULTIVITAMIN W/MINERALS CH
1.0000 | ORAL_TABLET | Freq: Every day | ORAL | Status: DC
  Administered 2020-10-08 – 2020-10-09 (×2): 1 via ORAL
  Filled 2020-10-07 (×3): qty 1

## 2020-10-07 MED ORDER — PROPOFOL 10 MG/ML IV BOLUS
INTRAVENOUS | Status: AC
  Filled 2020-10-07: qty 20

## 2020-10-07 MED ORDER — LIDOCAINE HCL (PF) 2 % IJ SOLN
INTRAMUSCULAR | Status: AC
  Filled 2020-10-07: qty 5

## 2020-10-07 MED ORDER — NEOMYCIN-POLYMYXIN B GU 40-200000 IR SOLN
Status: AC
  Filled 2020-10-07: qty 2

## 2020-10-07 MED ORDER — PHENYLEPHRINE HCL (PRESSORS) 10 MG/ML IV SOLN
INTRAVENOUS | Status: DC | PRN
  Administered 2020-10-07 (×4): 200 ug via INTRAVENOUS

## 2020-10-07 MED ORDER — VANCOMYCIN HCL 1000 MG IV SOLR
INTRAVENOUS | Status: AC
  Filled 2020-10-07: qty 1000

## 2020-10-07 MED ORDER — ACETAMINOPHEN 325 MG PO TABS: 650.0000 mg | ORAL_TABLET | Freq: Four times a day (QID) | ORAL | Status: DC | PRN

## 2020-10-07 MED ORDER — ENSURE MAX PROTEIN PO LIQD
11.0000 [oz_av] | Freq: Two times a day (BID) | ORAL | Status: DC
  Administered 2020-10-08 – 2020-10-10 (×5): 11 [oz_av] via ORAL
  Filled 2020-10-07: qty 330

## 2020-10-07 MED ORDER — PROPOFOL 10 MG/ML IV BOLUS
INTRAVENOUS | Status: DC | PRN
  Administered 2020-10-07: 150 mg via INTRAVENOUS

## 2020-10-07 MED ORDER — VASOPRESSIN 20 UNIT/ML IV SOLN
INTRAVENOUS | Status: DC | PRN
  Administered 2020-10-07 (×3): 2 [IU] via INTRAVENOUS

## 2020-10-07 MED ORDER — ACETAMINOPHEN 650 MG RE SUPP: 650.0000 mg | Freq: Four times a day (QID) | RECTAL | Status: DC | PRN

## 2020-10-07 MED ORDER — FENTANYL CITRATE (PF) 100 MCG/2ML IJ SOLN: 25.0000 ug | INTRAMUSCULAR | Status: DC | PRN

## 2020-10-07 MED ORDER — LIDOCAINE HCL (PF) 1 % IJ SOLN
INTRAMUSCULAR | Status: DC | PRN
  Administered 2020-10-07: 10 mL

## 2020-10-07 MED ORDER — ONDANSETRON HCL 4 MG/2ML IJ SOLN: 4.0000 mg | Freq: Once | INTRAMUSCULAR | Status: DC | PRN

## 2020-10-07 MED ORDER — NEOMYCIN-POLYMYXIN B GU 40-200000 IR SOLN
Status: DC | PRN
  Administered 2020-10-07: 2 mL

## 2020-10-07 MED ORDER — MIDAZOLAM HCL 2 MG/2ML IJ SOLN
INTRAMUSCULAR | Status: DC | PRN
  Administered 2020-10-07: 2 mg via INTRAVENOUS

## 2020-10-07 MED ORDER — BUPIVACAINE HCL 0.5 % IJ SOLN
INTRAMUSCULAR | Status: DC | PRN
  Administered 2020-10-07: 15 mL

## 2020-10-07 MED ORDER — OXYCODONE HCL 5 MG/5ML PO SOLN: 5.0000 mg | Freq: Once | ORAL | Status: DC | PRN

## 2020-10-07 SURGICAL SUPPLY — 68 items
BAG COUNTER SPONGE EZ (MISCELLANEOUS) ×2 IMPLANT
BLADE MED AGGRESSIVE (BLADE) ×2 IMPLANT
BLADE OSC/SAGITTAL MD 5.5X18 (BLADE) IMPLANT
BLADE OSCILLATING/SAGITTAL (BLADE)
BLADE SURG 15 STRL LF DISP TIS (BLADE) IMPLANT
BLADE SURG 15 STRL SS (BLADE)
BLADE SURG MINI STRL (BLADE) IMPLANT
BLADE SW THK.38XMED LNG THN (BLADE) IMPLANT
BNDG CONFORM 2 STRL LF (GAUZE/BANDAGES/DRESSINGS) IMPLANT
BNDG CONFORM 3 STRL LF (GAUZE/BANDAGES/DRESSINGS) IMPLANT
BNDG ELASTIC 3X5.8 VLCR NS LF (GAUZE/BANDAGES/DRESSINGS) IMPLANT
BNDG ELASTIC 4X5.8 VLCR NS LF (GAUZE/BANDAGES/DRESSINGS) IMPLANT
BNDG ELASTIC 4X5.8 VLCR STR LF (GAUZE/BANDAGES/DRESSINGS) ×2 IMPLANT
BNDG ESMARK 4X12 TAN STRL LF (GAUZE/BANDAGES/DRESSINGS) ×2 IMPLANT
BNDG GAUZE 4.5X4.1 6PLY STRL (MISCELLANEOUS) ×2 IMPLANT
CANISTER SUCT 1200ML W/VALVE (MISCELLANEOUS) ×2 IMPLANT
CANISTER SUCT 3000ML PPV (MISCELLANEOUS) IMPLANT
CNTNR SPEC 2.5X3XGRAD LEK (MISCELLANEOUS) ×1
CONT SPEC 4OZ STER OR WHT (MISCELLANEOUS) ×1
CONTAINER SPEC 2.5X3XGRAD LEK (MISCELLANEOUS) ×1 IMPLANT
COVER WAND RF STERILE (DRAPES) ×2 IMPLANT
CUFF TOURN DUAL PL 12 NO SLV (MISCELLANEOUS) IMPLANT
CUFF TOURN SGL QUICK 12 (TOURNIQUET CUFF) IMPLANT
CUFF TOURN SGL QUICK 18X4 (TOURNIQUET CUFF) IMPLANT
DRAPE FLUOR MINI C-ARM 54X84 (DRAPES) IMPLANT
DURAPREP 26ML APPLICATOR (WOUND CARE) ×2 IMPLANT
ELECT REM PT RETURN 9FT ADLT (ELECTROSURGICAL) ×2
ELECTRODE REM PT RTRN 9FT ADLT (ELECTROSURGICAL) ×1 IMPLANT
GAUZE PACKING 1/4 X5 YD (GAUZE/BANDAGES/DRESSINGS) IMPLANT
GAUZE PACKING IODOFORM 1/2 (PACKING) ×1 IMPLANT
GAUZE PACKING IODOFORM 1X5 (PACKING) IMPLANT
GAUZE SPONGE 4X4 12PLY STRL (GAUZE/BANDAGES/DRESSINGS) ×4 IMPLANT
GAUZE XEROFORM 1X8 LF (GAUZE/BANDAGES/DRESSINGS) ×2 IMPLANT
GLOVE BIO SURGEON STRL SZ7 (GLOVE) ×2 IMPLANT
GLOVE INDICATOR 7.0 STRL GRN (GLOVE) ×2 IMPLANT
GOWN STRL REUS W/ TWL LRG LVL3 (GOWN DISPOSABLE) ×2 IMPLANT
GOWN STRL REUS W/TWL LRG LVL3 (GOWN DISPOSABLE) ×2
HANDPIECE VERSAJET DEBRIDEMENT (MISCELLANEOUS) IMPLANT
IV NS 1000ML (IV SOLUTION) ×2
IV NS 1000ML BAXH (IV SOLUTION) IMPLANT
KIT TURNOVER KIT A (KITS) ×2 IMPLANT
LABEL OR SOLS (LABEL) ×2 IMPLANT
NDL FILTER BLUNT 18X1 1/2 (NEEDLE) ×1 IMPLANT
NDL HYPO 25X1 1.5 SAFETY (NEEDLE) ×2 IMPLANT
NEEDLE FILTER BLUNT 18X 1/2SAF (NEEDLE) ×1
NEEDLE FILTER BLUNT 18X1 1/2 (NEEDLE) ×1 IMPLANT
NEEDLE HYPO 25X1 1.5 SAFETY (NEEDLE) ×4 IMPLANT
NS IRRIG 500ML POUR BTL (IV SOLUTION) ×2 IMPLANT
PACK EXTREMITY (MISCELLANEOUS) ×2 IMPLANT
PAD ABD DERMACEA PRESS 5X9 (GAUZE/BANDAGES/DRESSINGS) ×2 IMPLANT
PULSAVAC PLUS IRRIG FAN TIP (DISPOSABLE) ×2
RASP SM TEAR CROSS CUT (RASP) IMPLANT
SOL .9 NS 3000ML IRR  AL (IV SOLUTION)
SOL .9 NS 3000ML IRR UROMATIC (IV SOLUTION) IMPLANT
SOL PREP PVP 2OZ (MISCELLANEOUS) ×2
SOLUTION PREP PVP 2OZ (MISCELLANEOUS) ×1 IMPLANT
STOCKINETTE STRL 6IN 960660 (GAUZE/BANDAGES/DRESSINGS) ×2 IMPLANT
SUT ETHILON 3-0 FS-10 30 BLK (SUTURE) ×4
SUT ETHILON 4-0 (SUTURE) ×1
SUT ETHILON 4-0 FS2 18XMFL BLK (SUTURE) ×1
SUT VIC AB 3-0 SH 27 (SUTURE) ×1
SUT VIC AB 3-0 SH 27X BRD (SUTURE) ×1 IMPLANT
SUT VIC AB 4-0 FS2 27 (SUTURE) IMPLANT
SUTURE EHLN 3-0 FS-10 30 BLK (SUTURE) ×2 IMPLANT
SUTURE ETHLN 4-0 FS2 18XMF BLK (SUTURE) IMPLANT
SWAB CULTURE AMIES ANAERIB BLU (MISCELLANEOUS) IMPLANT
SYR 10ML LL (SYRINGE) ×2 IMPLANT
TIP FAN IRRIG PULSAVAC PLUS (DISPOSABLE) IMPLANT

## 2020-10-07 NOTE — Anesthesia Postprocedure Evaluation (Signed)
Anesthesia Post Note  Patient: [name]  Procedure(s) Performed: AMPUTATION RAY-Right Fifth Ray (Right ) IRRIGATION AND DEBRIDEMENT FOOT (Right )  Patient location during evaluation: PACU Anesthesia Type: General Level of consciousness: awake and alert Pain management: pain level controlled Vital Signs Assessment: post-procedure vital signs reviewed and stable Respiratory status: spontaneous breathing, nonlabored ventilation, respiratory function stable and patient connected to nasal cannula oxygen Cardiovascular status: blood pressure returned to baseline and stable Postop Assessment: no apparent nausea or vomiting Anesthetic complications: no   No complications documented.   Last Vitals:  Vitals:   10/07/20 1820 10/07/20 1823  BP: 114/70   Pulse: 70 68  Resp: 16 16  Temp:  (!) 36.1 C  SpO2: 91% 93%    Last Pain:  Vitals:   10/07/20 1823  TempSrc:   PainSc: 0-No pain                 Corinda Gubler

## 2020-10-07 NOTE — Progress Notes (Addendum)
Initial Nutrition Assessment  DOCUMENTATION CODES:   Not applicable  INTERVENTION:   Ensure Max protein supplement BID, each supplement provides 150kcal and 30g of protein.  MVI daily   Vitamin C 223m po BID  NUTRITION DIAGNOSIS:   Increased nutrient needs related to wound healing as evidenced by increased estimated needs.  GOAL:   Patient will meet greater than or equal to 90% of their needs  MONITOR:   PO intake, Supplement acceptance, Labs, Weight trends, Skin, I & O's  REASON FOR ASSESSMENT:   Malnutrition Screening Tool    ASSESSMENT:   59y.o. male with medical history significant for recurrent diabetic foot ulcers, osteomyelitis of the left foot, type 2 diabetes mellitus with most recent hemoglobin A1c 7.3% in June 2021 and hyperlipidemia who is admitted with right foot erythema and sepsis   Pt s/p I & D 10/28  Met with pt in room today. Pt reports good appetite and oral intake pta and in hospital; pt reports that he is eating 100% of meals. Pt currently NPO for 5th ray amputation scheduled for today. RD discussed with pt the importance of adequate nutrition needed to support wound healing. Pt reports that he is willing to drink vanilla supplements in hospital. RD will add supplements and vitamins to support wound healing. Per chart, pt appears weight stable at baseline. RD provided pt with DM diet education today.   RD provided "Nutrition and Type II Diabetes" handout from the Academy of Nutrition and Dietetics. Discussed different food groups and their effects on blood sugar, emphasizing carbohydrate-containing foods. Provided list of carbohydrates and recommended serving sizes of common foods.  Discussed importance of controlled and consistent carbohydrate intake throughout the day. Provided examples of ways to balance meals/snacks and encouraged intake of high-fiber, whole grain complex carbohydrates. Teach back method used.  Medications reviewed and include:  lovenox, insulin, vancomycin   Labs reviewed: wbc- 11.9(H) cbgs- 200, 179 x 24 hrs AIC 9.6(H)- 10/28  NUTRITION - FOCUSED PHYSICAL EXAM:    Most Recent Value  Orbital Region No depletion  Upper Arm Region Mild depletion  Thoracic and Lumbar Region No depletion  Buccal Region No depletion  Temple Region No depletion  Clavicle Bone Region No depletion  Clavicle and Acromion Bone Region No depletion  Scapular Bone Region No depletion  Dorsal Hand No depletion  Patellar Region Mild depletion  Anterior Thigh Region Mild depletion  Posterior Calf Region Mild depletion  Edema (RD Assessment) Mild  Hair Reviewed  Eyes Reviewed  Mouth Reviewed  Skin Reviewed  Nails Reviewed     Diet Order:   Diet Order            Diet NPO time specified  Diet effective ____                EDUCATION NEEDS:   Education needs have been addressed  Skin:  Skin Assessment: Reviewed RN Assessment (R diabetic foot wound)  Last BM:  10/27  Height:   Ht Readings from Last 1 Encounters:  10/05/20 6' (1.829 m)    Weight:   Wt Readings from Last 1 Encounters:  10/07/20 108.1 kg    Ideal Body Weight:  80.9 kg  BMI:  Body mass index is 32.33 kg/m.  Estimated Nutritional Needs:   Kcal:  2300-2600kcal/day  Protein:  115-130g/day  Fluid:  >2.3L/day  CKoleen DistanceMS, RD, LDN Please refer to [facility] Hospitalfor RD and/or RD on-call/weekend/after hours pager

## 2020-10-07 NOTE — H&P (Signed)
HISTORY AND PHYSICAL INTERVAL NOTE:  10/07/2020  4:10 PM  [name]  has presented today for surgery, with the diagnosis of right 5th MTPJ osteomyelitis with associated abscess and cellulitis.  The various methods of treatment have been discussed with the patient.  No guarantees were given.  After consideration of risks, benefits and other options for treatment, the patient has consented to surgery.  I have reviewed the patients' chart and labs.    PROCEDURE: 1. RIGHT PARTIAL 5TH RAY AMPUTATION 2. INCISION AND DRAINAGE RIGHT FOOT WITH DEBRIDEMENT OF ALL NONVIABLE AND NECROTIC TISSUE   A history and physical examination was performed in hospital.  The patient was reexamined.  There have been no changes to this history and physical examination.  Rosetta Posner, DPM

## 2020-10-07 NOTE — Consult Note (Signed)
Pharmacy Antibiotic Note  [name] is a 59 y.o. male with medical history including diabetes, peripheral artery disease admitted on 10/05/2020 with diabetic foot infection. Patient has been following with infectious diseases at [facility] for left foot osteomyelitis. Per chart, appears he was treated with levofloxacin + metronidazole 07/07/20 - 08/18/20 (6 weeks). He also follows with wound clinic. Pharmacy has been consulted for vancomycin dosing.  Plan: Patient received LD of vancomycin 2 g IV x 1 in the ED  Will continue maintenance regimen of vancomycin 1000 mg q12h --Goal 15 - 20 mcg/mL for osteomyelitis --Daily Scr per protocol --Levels at steady state is indicated as treating for Osteo and growing Staph Aureus in wound culture (susc pending)  Will order Vancomycin Trough for 0530 on 10/30 (date) prior to the 5th scheduled dose.  Sent instructions to nursing to help insure timeliness of draw in relation to dose.   Height: 6' (182.9 cm) Weight: 108.1 kg (238 lb 6.4 oz) IBW/kg (Calculated) : 77.6  Temp (24hrs), Avg:98.7 F (37.1 C), Min:97.9 F (36.6 C), Max:99.8 F (37.7 C)  Recent Labs  Lab 10/05/20 1444 10/06/20 0440 10/07/20 0423  WBC 15.3* 12.6* 11.9*  CREATININE 1.06 0.68 0.64  LATICACIDVEN 1.4  --   --     Estimated Creatinine Clearance: 129.4 mL/min (by C-G formula based on SCr of 0.64 mg/dL).    Allergies  Allergen Reactions   Amoxicillin Rash    Antimicrobials this admission: Vancomycin 10/27 >>   Dose adjustments this admission:  Microbiology results: 10/27 Wound culture: pending 10/27 BCx: pending  Thank you for allowing pharmacy to be a part of this patients care.  Albina Billet, PharmD, BCPS Clinical Pharmacist 10/07/2020 9:00 AM

## 2020-10-07 NOTE — Anesthesia Preprocedure Evaluation (Signed)
Anesthesia Evaluation  Patient identified by MRN, date of birth, ID band Patient awake    Reviewed: Allergy & Precautions, H&P , NPO status , Patient's Chart, lab work & pertinent test results  History of Anesthesia Complications Negative for: history of anesthetic complications  Airway Mallampati: III  TM Distance: >3 FB Neck ROM: full    Dental  (+) Teeth Intact   Pulmonary neg pulmonary ROS, neg sleep apnea, neg COPD, Patient abstained from smoking.Not current smoker,    Pulmonary exam normal breath sounds clear to auscultation       Cardiovascular Exercise Tolerance: Good METShypertension, + Peripheral Vascular Disease  (-) CAD and (-) Past MI Normal cardiovascular exam(-) dysrhythmias  Rhythm:regular Rate:Normal     Neuro/Psych negative neurological ROS  negative psych ROS   GI/Hepatic GERD  Controlled,(+)     (-) substance abuse  ,   Endo/Other  diabetes  Renal/GU negative Renal ROS     Musculoskeletal   Abdominal   Peds  Hematology   Anesthesia Other Findings Past Medical History: No date: Arthritis     Comment:  hands 12/29/2019: COVID-19 No date: Diabetes mellitus without complication (HCC)     Comment:  type 2 No date: GERD (gastroesophageal reflux disease) No date: Hypercholesterolemia No date: Osteomyelitis of left foot (HCC)  Reproductive/Obstetrics                             Anesthesia Physical  Anesthesia Plan  ASA: II  Anesthesia Plan: General   Post-op Pain Management:    Induction: Intravenous  PONV Risk Score and Plan: 2 and Treatment may vary due to age or medical condition, Dexamethasone, Ondansetron and Midazolam  Airway Management Planned: Natural Airway and LMA  Additional Equipment: None  Intra-op Plan:   Post-operative Plan: Extubation in OR  Informed Consent: I have reviewed the patients History and Physical, chart, labs and discussed  the procedure including the risks, benefits and alternatives for the proposed anesthesia with the patient or authorized representative who has indicated his/her understanding and acceptance.     Dental Advisory Given  Plan Discussed with: CRNA  Anesthesia Plan Comments: (Discussed risks of anesthesia with patient, including PONV, sore throat, lip/dental damage. Rare risks discussed as well, such as cardiorespiratory and neurological sequelae. Patient understands.)        Anesthesia Quick Evaluation

## 2020-10-07 NOTE — Transfer of Care (Signed)
Immediate Anesthesia Transfer of Care Note  Patient: Hanif Radin  Procedure(s) Performed: AMPUTATION RAY-Right Fifth Ray (Right ) IRRIGATION AND DEBRIDEMENT FOOT (Right )  Patient Location: PACU  Anesthesia Type:General  Level of Consciousness: drowsy  Airway & Oxygen Therapy: Patient Spontanous Breathing and Patient connected to face mask oxygen  Post-op Assessment: Report given to RN and Post -op Vital signs reviewed and stable  Post vital signs: Reviewed and stable  Last Vitals:  Vitals Value Taken Time  BP 93/60 10/07/20 1742  Temp    Pulse 63 10/07/20 1745  Resp 0 10/07/20 1745  SpO2 99 % 10/07/20 1745  Vitals shown include unvalidated device data.  Last Pain:  Vitals:   10/07/20 1541  TempSrc:   PainSc: 4          Complications: No complications documented.

## 2020-10-07 NOTE — Progress Notes (Signed)
[facility] PROGRESS NOTE   [name] BVQ:[id number] DOB: 1961/01/01 DOA: 10/05/2020  PCP: Patient, No Pcp Per  Brief History/Interval Summary: 59 y.o. male with medical history significant for recurrent diabetic foot ulcers, osteomyelitis of the left foot, type 2 diabetes mellitus with most recent hemoglobin A1c 7.3% in June 2021, hyperlipidemia, who was admitted to [facility] on 10/05/2020 with sepsis due to right lower extremity cellulitis after presenting from home to [facility] complaining of right foot erythema.   Patient was seen by podiatry.  Plan is for surgical intervention on Friday.  Patient underwent MRI of the right foot.   Reason for Visit: Right foot cellulitis and concern for osteomyelitis of the fifth metatarsal  Consultants: Podiatry, Dr. Luana Shu  Procedures: None yet  Antibiotics: Anti-infectives (From admission, onward)   Start     Dose/Rate Route Frequency Ordered Stop   10/06/20 0600  vancomycin (VANCOCIN) IVPB 1000 mg/200 mL premix        1,000 mg 200 mL/hr over 60 Minutes Intravenous Every 12 hours 10/05/20 2026     10/05/20 1530  vancomycin (VANCOREADY) IVPB 2000 mg/400 mL        2,000 mg 200 mL/hr over 120 Minutes Intravenous  Once 10/05/20 1445 10/05/20 1718   10/05/20 1445  vancomycin (VANCOREADY) IVPB 1750 mg/350 mL  Status:  Discontinued        1,750 mg 175 mL/hr over 120 Minutes Intravenous  Once 10/05/20 1432 10/05/20 1443      Subjective/Interval History: Patient denies any complaints.  He is not very communicative.  Denies any shortness of breath.  No significant pain in the right foot.    Assessment/Plan:  Cellulitis of the right foot with concern for osteomyelitis of the fifth metatarsal with concern also for small abscess/sepsis, present on admission Patient had SIRS criteria at admission including tachycardia leukocytosis.  Sepsis physiology has resolved. Patient noted to  have erythema of the right foot.  MRI raises concern for a small abscess concern for osteomyelitis involving the metatarsal head.  Patient started on vancomycin.  Seen by podiatry.  Plan is for amputation today.  He underwent bedside debridement yesterday.  Cultures from the wound is growing moderate staph aureus.  Waiting on final identification and sensitivities.  Currently on vancomycin.  Will likely need to change antibiotics depending on final report. WBC is improving.  ESR was noted to be 87.  CRP noted to be 18.9.  Lactic acid level was normal.  Mild dehydration/prerenal azotemia Patient had mildly elevated BUN.  This is improved with IV hydration.  Diabetes mellitus type 2, uncontrolled with hyperglycemia HbA1c was 7.3 in June.  Noted to be 9.6 during this hospital stay.  Patient on glipizide Metformin and Tresiba at home.  Currently on Levemir and SSI.   CBGs are poorly controlled.  Will not increase the dose of Levemir today as the patient is n.p.o. currently.  May need to increase it tomorrow.  Hyperlipidemia Continue Lipitor.  Normocytic anemia Likely due to acute illness.  No evidence for acute loss.  Obesity Estimated body mass index is 32.33 kg/m as calculated from the following:   Height as of this encounter: 6' (1.829 m).   Weight as of this encounter: 108.1 kg.   DVT Prophylaxis: Lovenox Code Status: FullCode Family Communication: Discussed with patient Disposition Plan: Hopefully return home when improved  Status is: Inpatient  Remains inpatient appropriate because:IV treatments appropriate due to intensity of illness or inability to take PO  and Inpatient level of care appropriate due to severity of illness   Dispo: The patient is from: Home              Anticipated d/c is to: Home              Anticipated d/c date is: 2 days              Patient currently is not medically stable to d/c.    Medications:  Scheduled: . atorvastatin  20 mg Oral QODAY  .  enoxaparin (LOVENOX) injection  0.5 mg/kg Subcutaneous Q24H  . feeding supplement  296 mL Oral Once  . insulin aspart  0-9 Units Subcutaneous TID WC  . insulin detemir  8 Units Subcutaneous QHS  . povidone-iodine  2 application Topical Once   Continuous: . vancomycin 1,000 mg (10/07/20 0612)   MLY:[id number] **OR** acetaminophen, fentaNYL (SUBLIMAZE) injection, melatonin, ondansetron **OR** ondansetron (ZOFRAN) IV   Objective:  Vital Signs  Vitals:   10/06/20 2350 10/07/20 0500 10/07/20 0503 10/07/20 0721  BP: (!) 156/83  (!) 151/80 140/72  Pulse: 91  82 77  Resp: _0 Temp: 98.9 F (37.2 C)  98.2 F (36.8 C) 98.6 F (37 C)  TempSrc: Oral  Oral Oral  SpO2: 92%  92% 96%  Weight:  108.1 kg    Height:        Intake/Output Summary (Last 24 hours) at 10/07/2020 1033 Last data filed at 10/07/2020 0400 Gross per 24 hour  Intake 1446.63 ml  Output 725 ml  Net 721.63 ml   Filed Weights   10/05/20 1240 10/07/20 0500  Weight: 110.7 kg 108.1 kg    General appearance: Awake alert.  In no distress Resp: Clear to auscultation bilaterally.  Normal effort Cardio: S1-S2 is normal regular.  No S3-S4.  No rubs murmurs or bruit GI: Abdomen is soft.  Nontender nondistended.  Bowel sounds are present normal.  No masses organomegaly Extremities: No edema noted.  Right foot covered in dressing. Neurologic: Alert and oriented x3.  No focal neurological deficits.    Lab Results:  Data Reviewed: I have personally reviewed following labs and imaging studies  CBC: Recent Labs  Lab 10/05/20 1444 10/06/20 0440 10/07/20 0423  WBC 15.3* 12.6* 11.9*  NEUTROABS 13.3* 9.9*  --   HGB 12.4* 11.8* 11.3*  HCT 35.3* 33.2* 32.4*  MCV 91.7 92.0 91.5  PLT 212 185 517    Basic Metabolic Panel: Recent Labs  Lab 10/05/20 1444 10/06/20 0440 10/07/20 0423  NA 131* 135 135  K 4.0 3.7 3.8  CL 95* 98 98  CO2 _1 GLUCOSE 324* 170* 199*  BUN 32* 17 14  CREATININE 1.06 0.68  0.64  CALCIUM 8.3* 8.3* 8.4*  MG 2.0 1.8  --     GFR: Estimated Creatinine Clearance: 129.4 mL/min (by C-G formula based on SCr of 0.64 mg/dL).  Liver Function Tests: Recent Labs  Lab 10/05/20 1444 10/06/20 0440  AST 25 19  ALT 21 17  ALKPHOS 57 52  BILITOT 1.2 1.0  PROT 7.3 6.8  ALBUMIN 3.5 3.1*     Coagulation Profile: Recent Labs  Lab 10/05/20 1444  INR 1.3*    HbA1C: Recent Labs    10/05/20 1444 10/06/20 0440  HGBA1C 9.6* 9.6*    CBG: Recent Labs  Lab 10/06/20 0814 10/06/20 1334 10/06/20 1617 10/06/20 2052 10/07/20 0826  GLUCAP 140* 234* 276* 231* 200*     Recent Results (from  the past 240 hour(s))  Aerobic Culture (superficial specimen)     Status: None (Preliminary result)   Collection Time: 10/05/20  9:52 AM   Specimen: Foot; Wound  Result Value Ref Range Status   Specimen Description   Final    FOOT RIGHT Performed at [facility], [address], [place], [place] [address]    Special Requests   Final    Normal Performed at [facility] [address] [place], [place] [address]    Gram Stain   Final    FEW WBC PRESENT, PREDOMINANTLY PMN FEW GRAM POSITIVE COCCI IN CLUSTERS Performed at [facility], [place] [address], [place], [place] [address]    Culture MODERATE STAPHYLOCOCCUS AUREUS  Final   Report Status PENDING  Incomplete  Aerobic Culture (superficial specimen)     Status: None (Preliminary result)   Collection Time: 10/05/20  9:52 AM   Specimen: Foot; Wound  Result Value Ref Range Status   Specimen Description   Final    FOOT RIGHT SOLE Performed at [facility], [address] [place], [place] [address]    Special Requests   Final    Normal Performed at [facility] [address] [place], [place] [address]    Gram Stain   Final    RARE WBC PRESENT, PREDOMINANTLY PMN FEW GRAM POSITIVE COCCI IN PAIRS IN CLUSTERS    Culture   Final    MODERATE STAPHYLOCOCCUS  AUREUS SUSCEPTIBILITIES TO FOLLOW Performed at [facility], [place] [address] [place], [place] [address]    Report Status PENDING  Incomplete  Culture, blood (routine x 2)     Status: None (Preliminary result)   Collection Time: 10/05/20  2:44 PM   Specimen: BLOOD  Result Value Ref Range Status   Specimen Description BLOOD BLOOD LEFT FOREARM  Final   Special Requests   Final    BOTTLES DRAWN AEROBIC AND ANAEROBIC Blood Culture results may not be optimal due to an inadequate volume of blood received in culture bottles   Culture   Final    NO GROWTH 2 DAYS Performed at [facility], [address], [place], [place] [address]    Report Status PENDING  Incomplete  Culture, blood (routine x 2)     Status: None (Preliminary result)   Collection Time: 10/05/20  2:44 PM   Specimen: BLOOD  Result Value Ref Range Status   Specimen Description BLOOD BLOOD RIGHT FOREARM  Final   Special Requests   Final    BOTTLES DRAWN AEROBIC AND ANAEROBIC Blood Culture adequate volume   Culture   Final    NO GROWTH 2 DAYS Performed at [facility], [address] [place], [place] [address]    Report Status PENDING  Incomplete  Respiratory Panel by RT PCR (Flu A&B, Covid) - Nasopharyngeal Swab     Status: None   Collection Time: 10/05/20  2:44 PM   Specimen: Nasopharyngeal Swab  Result Value Ref Range Status   SARS Coronavirus 2 by RT PCR NEGATIVE NEGATIVE Final    Comment: (NOTE) SARS-CoV-2 target nucleic acids are NOT DETECTED.  The SARS-CoV-2 RNA is generally detectable in upper respiratoy specimens during the acute phase of infection. The lowest concentration of SARS-CoV-2 viral copies this assay can detect is 131 copies/mL. A negative result does not preclude SARS-Cov-2 infection and should not be used as the sole basis for treatment or other patient management decisions. A  negative result may occur with  improper specimen collection/handling, submission of specimen  other than nasopharyngeal swab, presence of viral mutation(s) within the areas targeted by this assay, and inadequate number of viral copies (<131 copies/mL). A negative result must be combined with clinical observations, patient history, and epidemiological information. The expected result is Negative.  Fact Sheet for Patients:  PinkCheek.be  Fact Sheet for Healthcare Providers:  GravelBags.it  This test is no t yet approved or cleared by the [place] FDA and  has been authorized for detection and/or diagnosis of SARS-CoV-2 by FDA under an Emergency Use Authorization (EUA). This EUA will remain  in effect (meaning this test can be used) for the duration of the COVID-19 declaration under Section 564(b)(1) of the Act, 21 U.S.C. section 360bbb-3(b)(1), unless the authorization is terminated or revoked sooner.     Influenza A by PCR NEGATIVE NEGATIVE Final   Influenza B by PCR NEGATIVE NEGATIVE Final    Comment: (NOTE) The Xpert Xpress SARS-CoV-2/FLU/RSV assay is intended as an aid in  the diagnosis of influenza from Nasopharyngeal swab specimens and  should not be used as a sole basis for treatment. Nasal washings and  aspirates are unacceptable for Xpert Xpress SARS-CoV-2/FLU/RSV  testing.  Fact Sheet for Patients: PinkCheek.be  Fact Sheet for Healthcare Providers: GravelBags.it  This test is not yet approved or cleared by the [place] FDA and  has been authorized for detection and/or diagnosis of SARS-CoV-2 by  FDA under an Emergency Use Authorization (EUA). This EUA will remain  in effect (meaning this test can be used) for the duration of the  Covid-19 declaration under Section 564(b)(1) of the Act, 21  U.S.C. section 360bbb-3(b)(1), unless the authorization is  terminated or revoked. Performed at [facility], [address]  [place], [place] [address]   Aerobic/Anaerobic Culture (surgical/deep wound)     Status: None (Preliminary result)   Collection Time: 10/05/20  6:56 PM   Specimen: Wound  Result Value Ref Range Status   Specimen Description   Final    WOUND Performed at [facility], [address], [place], [place] [address]    Special Requests   Final    NONE Performed at [facility], [place], [place] [address]    Gram Stain NO WBC SEEN MODERATE GRAM POSITIVE COCCI   Final   Culture   Final    ABUNDANT STAPHYLOCOCCUS AUREUS SUSCEPTIBILITIES TO FOLLOW Performed at [facility], [place] [address], [place], [place] [address]    Report Status PENDING  Incomplete      Radiology Studies: MR FOOT RIGHT WO CONTRAST  Result Date: 10/05/2020 CLINICAL DATA:  Focal soft tissue swelling and ulceration along the lateral fifth metatarsal phalangeal joint. EXAM: MRI OF THE RIGHT FOREFOOT WITHOUT CONTRAST TECHNIQUE: Multiplanar, multisequence MR imaging of the right was performed. No intravenous contrast was administered. COMPARISON:  None. FINDINGS: Bones/Joint/Cartilage Mildly increased T2 hyperintense signal with question of minimally decreased T1 hypointensity seen within the fifth metatarsal head. No other areas of cortical destruction or periosteal reaction. There is mildly increased T2 hyperintense signal seen also at the base of the fifth proximal phalanx. Joint space loss with subchondral cystic changes are seen at the fifth and fourth metatarsal base cuboid articulation. Ligaments The Lisfranc ligaments are intact. Muscles and Tendons There is increased feathery signal seen throughout the muscles of the forefoot with mild atrophy. The flexor and extensor tendons are intact. Soft tissues There is a  focal area of ulceration seen on the plantar surface of the fifth metatarsal head measuring approximately 5 mm in transverse dimension. There is a fluid-filled sinus tract seen at  this level. Non loculated fluid is seen surrounding the base of the fifth metatarsal head. There is diffuse overlying skin thickening and subcutaneous edema. There is also a multilocular small fluid collection seen along the lateral aspect of the fifth metatarsal head measuring approximately 1.5 x 0.8 cm. Diffuse overlying dorsal subcutaneous edema and skin thickening is seen. IMPRESSION: Area of ulceration seen on the lateral plantar base of the fifth metatarsal head with a sinus tract and probable small soft tissue abscess measuring 1.5 x 0.8 cm. Findings which could be suggestive of early osteomyelitis of the fifth metatarsal head. Probable reactive marrow at the fifth proximal phalanx. Electronically Signed   By: Prudencio Pair M.D.   On: 10/05/2020 21:07       LOS: 2 days   [facility] Pager on [email or web address]  10/07/2020, 10:33 AM

## 2020-10-07 NOTE — Anesthesia Procedure Notes (Signed)
Procedure Name: LMA Insertion Date/Time: 10/07/2020 4:22 PM Performed by: Ginger Carne, CRNA Pre-anesthesia Checklist: Patient identified, Emergency Drugs available, Suction available, Patient being monitored and Timeout performed Patient Re-evaluated:Patient Re-evaluated prior to induction Oxygen Delivery Method: Circle system utilized Preoxygenation: Pre-oxygenation with 100% oxygen Induction Type: IV induction LMA: LMA inserted LMA Size: 5.0 Tube type: Oral Airway Equipment and Method: Oral airway Placement Confirmation: positive ETCO2 and breath sounds checked- equal and bilateral Tube secured with: Tape Dental Injury: Teeth and Oropharynx as per pre-operative assessment

## 2020-10-07 NOTE — Op Note (Addendum)
PODIATRY / FOOT AND ANKLE SURGERY OPERATIVE REPORT    SURGEON: Caroline More, DPM  PRE-OPERATIVE DIAGNOSIS:  1. R 5th MTPJ osteomyelitis with associated ulceration and cellulitis with abscess 2.  Diabetes type 2 polyneuropathy  POST-OPERATIVE DIAGNOSIS: same  PROCEDURE(S): 1. 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 Rotational skin flap closure right foot <10sq cm  HEMOSTASIS: R ankle tourniquet  ANESTHESIA: MAC  ESTIMATED BLOOD LOSS: 20 cc  FINDING(S): 1.  Likely early osteomyelitic changes present to the fifth metatarsal phalangeal joint with abscess present and severe cellulitis  PATHOLOGY/SPECIMEN(S): Right fifth metatarsal phalangeal joint bone culture.  Right partial fifth ray pathology specimen with proximal margin marked in purple ink  INDICATIONS:   [name] is a 59 y.o. male who presents with a nonhealing ulceration to the right fifth metatarsal phalangeal joint.  Patient has been seen by the wound care center for this wound for a long period of time.  Patient noticed increased redness and swelling to the foot as well as drainage coming from the wound and got concerned for infection so went to the ED for further evaluation.  Patient was noted to have osteomyelitic changes as well as abscess on MRI imaging.  A bedside incision and drainage was performed in the area of the abscess to drain the abscess.  All treatment options were discussed with the patient both conservative and surgical attempts at correction including potential risks and complications and at this time patient has elected for procedure consisting of right partial fifth ray amputation with possible incision and drainage with removal of all nonviable necrotic tissues.  DESCRIPTION: After obtaining full informed written consent, the patient was brought back to the operating room and placed supine upon the operating table.  The patient received IV antibiotics prior to induction.  After obtaining adequate  anesthesia, 15 cc of half percent Marcaine plain was injected about the right fifth ray in a reverse Mayo type block.  The patient was prepped and draped in the standard fashion.  An Esmarch bandage was used to exsanguinate the right lower extremity and the pneumatic ankle tourniquet was inflated.  Attention was then directed to the right fifth ray where an incision was started over the fifth metatarsal midshaft and done in such a way to incise and remove the large abscess/necrotic skin present to the dorsal lateral aspect of the fifth metatarsal phalangeal joint as well as the plantar ulceration.  The incision was extended also up to the fifth digit and a racquet type of incision creating 1 incision.  The necrotic tissue as well as the ulceration was excised and passed off the operative site.  The incision was made as a full-thickness incision overall.  Circumferential dissection was then performed around the fifth toe and the fifth metatarsal phalangeal joint was identified and the extensor tenotomy and capsulotomy was performed followed by release the collateral and suspensory ligaments as well as the flexor tendon and plantar plate.  The fifth toe was disarticulated and passed off the operative site.  A bone culture was then taken from the fifth metatarsal head as well as from the base of the proximal phalanx of the fifth toe.  These were passed off and sent off.  They are noted to be an abscess still present within the fifth metatarsal phalangeal joint level which was exsanguinated.  The incision was extended more proximally about the fifth metatarsal to the midshaft level and circumferential dissection was then performed around the fifth metatarsal.  At this time a sagittal  bone saw was used to resect the fifth metatarsal at the midshaft portion with the appropriate beveling leaving more dorsal lateral than plantar medial.  The flexor and extensor tendons were cut as far proximal as possible.  The tissues  were then probed further to look for any further abscess in the area and none appeared to be present at the dorsal, plantar, and lateral aspects of the forefoot.  The plantar plate was also resected and passed off the operative site.  Any nonviable necrotic tissue was also passed off the operative site after resection.  The fifth metatarsal was passed off the operative site after it was resected and the proximal margin was marked in purple ink and the pathologic specimen was passed off and sent off.  The surgical site was flushed with copious amounts normal sterile saline with use of the pulse lavage with 3 L of fluid.  Vancomycin powder was then placed into the wound.  The tissues were then rearranged in such a way to create a plantar flap to cover the void that was present in the lateral necrotic tissue and plantar ulceration in a T type manor rotating the skin to cover the void of about 2.5cm.  The tissues were rotated and Vicryl stitching was then performed in the subcutaneous tissues to reapproximate the tissues.  The flap appeared to cover the void with minimal tension overall.  The skin was then reapproximated well coapted with a combination of horizontal mattress and simple type stitching as well as vertical mattress type stitching with a combination of 3-0 and 4-0 nylon.  The surgical site was then packed with iodoform packing gauze.  An additional 10 cc of 1% lidocaine plain was then injected at the operative site.  The pneumatic ankle tourniquet was deflated and a prompt hyperemic response was noted to the flaps of the left foot hemostasis appeared to be under control.  A postoperative dressing was applied consisting of Xeroform followed by 4 x 4 gauze, ABD, Kerlix, Ace wrap.  Patient tolerated the procedure and anesthesia well was transferred to recovery room vital signs stable and vascular status appearing to be intact to the left foot.  Patient will be brought back to the inpatient room with the  appropriate orders and medications.  Patient will continue to be followed up with until discharge.  COMPLICATIONS: None  CONDITION: Good, stable  Caroline More, DPM

## 2020-10-08 ENCOUNTER — Encounter: Payer: Self-pay | Admitting: Podiatry

## 2020-10-08 LAB — CBC
HCT: 33.5 % — ABNORMAL LOW (ref 39.0–52.0)
Hemoglobin: 11.8 g/dL — ABNORMAL LOW (ref 13.0–17.0)
MCH: 32.2 pg (ref 26.0–34.0)
MCHC: 35.2 g/dL (ref 30.0–36.0)
MCV: 91.3 fL (ref 80.0–100.0)
Platelets: 236 10*3/uL (ref 150–400)
RBC: 3.67 MIL/uL — ABNORMAL LOW (ref 4.22–5.81)
RDW: 11.5 % (ref 11.5–15.5)
WBC: 13.8 10*3/uL — ABNORMAL HIGH (ref 4.0–10.5)
nRBC: 0 % (ref 0.0–0.2)

## 2020-10-08 LAB — GLUCOSE, CAPILLARY
Glucose-Capillary: 333 mg/dL — ABNORMAL HIGH (ref 70–99)
Glucose-Capillary: 313 mg/dL — ABNORMAL HIGH (ref 70–99)
Glucose-Capillary: 410 mg/dL — ABNORMAL HIGH (ref 70–99)
Glucose-Capillary: 236 mg/dL — ABNORMAL HIGH (ref 70–99)

## 2020-10-08 LAB — BASIC METABOLIC PANEL
Anion gap: 10 (ref 5–15)
BUN: 19 mg/dL (ref 6–20)
CO2: 24 mmol/L (ref 22–32)
Calcium: 7.9 mg/dL — ABNORMAL LOW (ref 8.9–10.3)
Chloride: 99 mmol/L (ref 98–111)
Creatinine, Ser: 0.77 mg/dL (ref 0.61–1.24)
GFR, Estimated: >60 mL/min (ref >60)
Glucose, Bld: 355 mg/dL — ABNORMAL HIGH (ref 70–99)
Potassium: 4.3 mmol/L (ref 3.5–5.1)
Sodium: 133 mmol/L — ABNORMAL LOW (ref 135–145)

## 2020-10-08 LAB — VANCOMYCIN, TROUGH: Vancomycin Tr: 8 ug/mL — ABNORMAL LOW (ref 15–20)

## 2020-10-08 MED ORDER — INSULIN ASPART 100 UNIT/ML ~~LOC~~ SOLN
20.0000 [IU] | Freq: Once | SUBCUTANEOUS | Status: AC
  Administered 2020-10-08: 20 [IU] via SUBCUTANEOUS

## 2020-10-08 MED ORDER — INSULIN DETEMIR 100 UNIT/ML ~~LOC~~ SOLN
10.0000 [IU] | Freq: Two times a day (BID) | SUBCUTANEOUS | Status: DC
  Administered 2020-10-08 – 2020-10-10 (×5): 10 [IU] via SUBCUTANEOUS
  Filled 2020-10-08 (×6): qty 0.1

## 2020-10-08 MED ORDER — INSULIN ASPART 100 UNIT/ML ~~LOC~~ SOLN
0.0000 [IU] | Freq: Every day | SUBCUTANEOUS | Status: DC
  Administered 2020-10-08 – 2020-10-09 (×2): 2 [IU] via SUBCUTANEOUS
  Filled 2020-10-08 (×2): qty 1

## 2020-10-08 MED ORDER — CEPHALEXIN 500 MG PO CAPS
500.0000 mg | ORAL_CAPSULE | Freq: Four times a day (QID) | ORAL | Status: DC
  Administered 2020-10-08 – 2020-10-10 (×7): 500 mg via ORAL
  Filled 2020-10-08 (×7): qty 1

## 2020-10-08 MED ORDER — INSULIN ASPART 100 UNIT/ML ~~LOC~~ SOLN
0.0000 [IU] | Freq: Three times a day (TID) | SUBCUTANEOUS | Status: DC
  Administered 2020-10-09: 7 [IU] via SUBCUTANEOUS
  Administered 2020-10-09 – 2020-10-10 (×2): 4 [IU] via SUBCUTANEOUS
  Filled 2020-10-08 (×3): qty 1

## 2020-10-08 NOTE — Progress Notes (Signed)
PODIATRY / FOOT AND ANKLE SURGERY PROGRESS NOTE  Reason for consult: Right foot infection   HPI: [name] is a 59 y.o. male who presents status post 1 day right partial fifth ray amputation with rotational skin flap closure. Patient rates his pain today at 4/10 and states it is relatively well controlled overall to the right foot. Patient states overall that he is feeling pretty well and the pain is subsiding and has gotten better since his admission. He notes no nausea, vomiting, fever, chills since procedure. Patient is kept his dressings clean, dry, and intact and his stay off of his right foot is much as possible. Patient has worked with physical therapy today.  PMHx:  Past Medical History:  Diagnosis Date  . Arthritis    hands  . COVID-19 12/29/2019  . Diabetes mellitus without complication ([place])    type 2  . GERD (gastroesophageal reflux disease)   . Hypercholesterolemia   . Osteomyelitis of left foot [facility])     Surgical Hx:  Past Surgical History:  Procedure Laterality Date  . ADENOIDECTOMY    . AMPUTATION Right 10/07/2020   Procedure: AMPUTATION RAY-Right Fifth Ray;  Surgeon: Caroline More, DPM;  Location: [facility];  Service: Podiatry;  Laterality: Right;  . CATARACT EXTRACTION W/PHACO Right 03/03/2020   Procedure: CATARACT EXTRACTION PHACO AND INTRAOCULAR LENS PLACEMENT ([place]) RIGHT DIABETIC VISION BLUE;  Surgeon: Marchia Meiers, MD;  Location: [place];  Service: Ophthalmology;  Laterality: Right;  COVID ( + ) 12-29-2019  30.97 02:27.7  . CATARACT EXTRACTION W/PHACO Left 06/23/2020   Procedure: CATARACT EXTRACTION PHACO AND INTRAOCULAR LENS PLACEMENT ([facility]) LEFT DIABETIC VISION BLUE 5.14  00:34.7;  Surgeon: Marchia Meiers, MD;  Location: [place];  Service: Ophthalmology;  Laterality: Left;  DIABETIC  . ELBOW FRACTURE SURGERY Right   . EXTERNAL EAR SURGERY    . IRRIGATION AND DEBRIDEMENT FOOT Right 10/07/2020   Procedure: IRRIGATION AND DEBRIDEMENT  FOOT;  Surgeon: Caroline More, DPM;  Location: [facility];  Service: Podiatry;  Laterality: Right;    FHx:  Family History  Problem Relation Age of Onset  . Dementia Mother   . Hypertension Mother   . Diabetes Maternal Grandmother   . Hypertension Maternal Grandmother   . Cancer Neg Hx   . COPD Neg Hx   . Heart disease Neg Hx   . Stroke Neg Hx     Social History:  reports that he has never smoked. He has never used smokeless tobacco. He reports previous alcohol use. He reports that he does not use drugs.  Allergies:  Allergies  Allergen Reactions  . Amoxicillin Rash    Review of Systems: General ROS: negative Respiratory ROS: no cough, shortness of breath, or wheezing Cardiovascular ROS: no chest pain or dyspnea on exertion Gastrointestinal ROS: no abdominal pain, change in bowel habits, or black or bloody stools Musculoskeletal ROS: positive for - joint pain, joint stiffness and joint swelling Neurological ROS: positive for - numbness/tingling Dermatological ROS: positive for R foot incision 5th ray partial amputation  Medications Prior to Admission  Medication Sig Dispense Refill  . Ascorbic Acid (VITAMIN C CR) 1500 MG TBCR Take 1,500 mg by mouth daily.    Marland Kitchen aspirin EC 81 MG tablet Take 81 mg by mouth daily.    Marland Kitchen atorvastatin (LIPITOR) 20 MG tablet Take 1 tablet (20 mg total) by mouth every other day. 90 tablet 1  . Cinnamon 500 MG capsule Take 500 mg by mouth daily.    Marland Kitchen  Garlic 1601 MG CAPS Take 1,000 mg by mouth daily.    Marland Kitchen glipiZIDE (GLUCOTROL) 5 MG tablet Take 1 tablet (5 mg total) by mouth 2 (two) times daily before a meal. (Patient taking differently: Take 5 mg by mouth 2 (two) times daily. Breakfast and dinner) 180 tablet 1  . insulin degludec (TRESIBA FLEXTOUCH) 100 UNIT/ML FlexTouch Pen Inject 0.18 mLs (18 Units total) into the skin daily. 15 mL 2  . metFORMIN (GLUCOPHAGE) 1000 MG tablet Take 1 tablet (1,000 mg total) by mouth 2 (two) times daily with a meal.  (Patient taking differently: Take 1,000 mg by mouth 2 (two) times daily with a meal. Breakfast and dinner) 180 tablet 1  . Accu-Chek FastClix Lancets MISC USE LANCET(S) TO CHECK GLUCOSE UP TO 4 TIMES DAILY    . ACCU-CHEK GUIDE test strip USE UP TO 4 TIMES DAILY AS DIRECTED    . blood glucose meter kit and supplies Dispense based on patient and insurance preference. Use up to four times daily as directed. (FOR ICD-10 E10.9, E11.9). 1 each 0  . Insulin Pen Needle (PEN NEEDLES) 32G X 6 MM MISC 1 Units by Does not apply route daily as needed. 100 each 11    Physical Exam: General: Alert and oriented.  No apparent distress.  Vascular: DP/PT pulses palpable bilateral. Capillary fill time appears to be intact to digits and to flap right foot. No hair growth noted to digits. Mild erythema noted to the dorsal lateral foot and fifth ray right amputation site along with mild swelling, appears to be improved overall since procedure and since admission.  Neuro: Light touch sensation reduced to digits bilateral.  Derm: Status post right partial fifth ray amputation with rotational skin flap closure. Skin edges appear to be well coapted with sutures intact. Capillary fill time appears to be intact to rotational flap. Still mild erythema and edema noted to the area of the procedure site but appears to be improving gradually. Minimal serous drainage noted today. No purulence expressed.      MSK: Right foot pain on palpation in the area of the procedure site. Status post right partial fifth ray amputation  Results for orders placed or performed during the hospital encounter of 10/05/20 (from the past 48 hour(s))  CBG monitoring, ED     Status: Abnormal   Collection Time: 10/06/20  1:34 PM  Result Value Ref Range   Glucose-Capillary 234 (H) 70 - 99 mg/dL    Comment: Glucose reference range applies only to samples taken after fasting for at least 8 hours.   Comment 1 Notify RN   Glucose, capillary      Status: Abnormal   Collection Time: 10/06/20  4:17 PM  Result Value Ref Range   Glucose-Capillary 276 (H) 70 - 99 mg/dL    Comment: Glucose reference range applies only to samples taken after fasting for at least 8 hours.  Glucose, capillary     Status: Abnormal   Collection Time: 10/06/20  8:52 PM  Result Value Ref Range   Glucose-Capillary 231 (H) 70 - 99 mg/dL    Comment: Glucose reference range applies only to samples taken after fasting for at least 8 hours.  CBC     Status: Abnormal   Collection Time: 10/07/20  4:23 AM  Result Value Ref Range   WBC 11.9 (H) 4.0 - 10.5 K/uL   RBC 3.54 (L) 4.22 - 5.81 MIL/uL   Hemoglobin 11.3 (L) 13.0 - 17.0 g/dL   HCT 32.4 (L)  39 - 52 %   MCV 91.5 80.0 - 100.0 fL   MCH 31.9 26.0 - 34.0 pg   MCHC 34.9 30.0 - 36.0 g/dL   RDW 11.5 11.5 - 15.5 %   Platelets 209 150 - 400 K/uL   nRBC 0.0 0.0 - 0.2 %    Comment: Performed at [facility], [address] [place], [place] [address]  Basic metabolic panel     Status: Abnormal   Collection Time: 10/07/20  4:23 AM  Result Value Ref Range   Sodium 135 135 - 145 mmol/L   Potassium 3.8 3.5 - 5.1 mmol/L   Chloride 98 98 - 111 mmol/L   CO2 29 22 - 32 mmol/L   Glucose, Bld 199 (H) 70 - 99 mg/dL    Comment: Glucose reference range applies only to samples taken after fasting for at least 8 hours.   BUN 14 6 - 20 mg/dL   Creatinine, Ser 0.64 0.61 - 1.24 mg/dL   Calcium 8.4 (L) 8.9 - 10.3 mg/dL   GFR, Estimated >60 >60 mL/min    Comment: (NOTE) Calculated using the CKD-EPI Creatinine Equation (2021)    Anion gap 8 5 - 15    Comment: Performed at [facility], [address] [place], [place] [address]  Glucose, capillary     Status: Abnormal   Collection Time: 10/07/20  8:26 AM  Result Value Ref Range   Glucose-Capillary 200 (H) 70 - 99 mg/dL    Comment: Glucose reference range applies only to samples taken after fasting for at least 8 hours.  Glucose, capillary     Status:  Abnormal   Collection Time: 10/07/20 11:38 AM  Result Value Ref Range   Glucose-Capillary 179 (H) 70 - 99 mg/dL    Comment: Glucose reference range applies only to samples taken after fasting for at least 8 hours.  Glucose, capillary     Status: Abnormal   Collection Time: 10/07/20  3:44 PM  Result Value Ref Range   Glucose-Capillary 124 (H) 70 - 99 mg/dL    Comment: Glucose reference range applies only to samples taken after fasting for at least 8 hours.  Aerobic/Anaerobic Culture (surgical/deep wound)     Status: None (Preliminary result)   Collection Time: 10/07/20  5:06 PM   Specimen: PATH Digit amputation; Tissue  Result Value Ref Range   Specimen Description TISSUE    Special Requests 5TH METATARSAL    Gram Stain NO WBC SEEN NO ORGANISMS SEEN     Culture      NO GROWTH < 12 HOURS Performed at [facility], [address], [place], [place] [address]    Report Status PENDING   Glucose, capillary     Status: Abnormal   Collection Time: 10/07/20  5:46 PM  Result Value Ref Range   Glucose-Capillary 138 (H) 70 - 99 mg/dL    Comment: Glucose reference range applies only to samples taken after fasting for at least 8 hours.  Glucose, capillary     Status: Abnormal   Collection Time: 10/07/20  9:22 PM  Result Value Ref Range   Glucose-Capillary 223 (H) 70 - 99 mg/dL    Comment: Glucose reference range applies only to samples taken after fasting for at least 8 hours.  CBC     Status: Abnormal   Collection Time: 10/08/20  5:07 AM  Result Value Ref Range   WBC 13.8 (H) 4.0 - 10.5 K/uL   RBC 3.67 (L) 4.22 -  5.81 MIL/uL   Hemoglobin 11.8 (L) 13.0 - 17.0 g/dL   HCT 33.5 (L) 39 - 52 %   MCV 91.3 80.0 - 100.0 fL   MCH 32.2 26.0 - 34.0 pg   MCHC 35.2 30.0 - 36.0 g/dL   RDW 11.5 11.5 - 15.5 %   Platelets 236 150 - 400 K/uL   nRBC 0.0 0.0 - 0.2 %    Comment: Performed at [facility], [address] [place], [place] [address]  Basic metabolic panel     Status:  Abnormal   Collection Time: 10/08/20  5:07 AM  Result Value Ref Range   Sodium 133 (L) 135 - 145 mmol/L   Potassium 4.3 3.5 - 5.1 mmol/L   Chloride 99 98 - 111 mmol/L   CO2 24 22 - 32 mmol/L   Glucose, Bld 355 (H) 70 - 99 mg/dL    Comment: Glucose reference range applies only to samples taken after fasting for at least 8 hours.   BUN 19 6 - 20 mg/dL   Creatinine, Ser 0.77 0.61 - 1.24 mg/dL   Calcium 7.9 (L) 8.9 - 10.3 mg/dL   GFR, Estimated >60 >60 mL/min    Comment: (NOTE) Calculated using the CKD-EPI Creatinine Equation (2021)    Anion gap 10 5 - 15    Comment: Performed at [facility], [address] [place], [place] [address]  Vancomycin, trough     Status: Abnormal   Collection Time: 10/08/20  5:07 AM  Result Value Ref Range   Vancomycin Tr 8 (L) 15 - 20 ug/mL    Comment: Performed at [facility] [address] [place], [place] [address]  Glucose, capillary     Status: Abnormal   Collection Time: 10/08/20  8:32 AM  Result Value Ref Range   Glucose-Capillary 333 (H) 70 - 99 mg/dL    Comment: Glucose reference range applies only to samples taken after fasting for at least 8 hours.   DG Foot Complete Right  Result Date: 10/07/2020 CLINICAL DATA:  Postoperative radiograph following amputation of the fifth ray EXAM: RIGHT FOOT COMPLETE - 3+ VIEW COMPARISON:  Radiograph 10/06/2019, MRI 10/05/2020 FINDINGS: There are postsurgical changes from amputation of the fifth ray at the level of the proximal fifth metatarsal diaphysis with overlying soft tissue changes and bandaging material. First ray is in notable extension with additional clawtoe deformities of the second through fourth rays similar to comparison. Slight angulation across the second D IP is similar to prior. No acute complication is evident. No new sites of ulceration or concerning acute osseous abnormality. Stable degenerative changes throughout the foot and imaged ankle. Bidirectional calcaneal spurs.  IMPRESSION: Postsurgical changes from amputation of the fifth ray at the level of the proximal fifth metatarsal diaphysis. No acute complication is evident. Electronically Signed   By: Lovena Le M.D.   On: 10/07/2020 18:37   DG MINI C-ARM IMAGE ONLY  Result Date: 10/07/2020 There is no interpretation for this exam.  This order is for images obtained during a surgical procedure.  Please See "Surgeries" Tab for more information regarding the procedure.    Blood pressure 138/81, pulse 77, temperature 98.5 F (36.9 C), temperature source Oral, resp. rate 20, height 6' (1.829 m), weight 109.3 kg, SpO2 96 %.  Assessment 1. Right fifth metatarsal phalangeal joint osteomyelitis with associated cellulitis and abscess status post right partial fifth ray amputation on 10/07/2020 2. Diabetes type 2 polyneuropathy, uncontrolled  Plan -Patient seen and examined. -  Dressing removed and right foot evaluated today. Appears to have skin edges well coapted with sutures intact. Iodoform packing gauze was removed today. Erythema and edema appear to be improving overall since admission. -Reapplied Betadine soaked gauze to the periincisional area followed by Xeroform, 4 x 4 gauze, ABD, Kerlix, Ace wrap. Patient tolerated well. -Cultures growing staph aureus, pending sensitivities. Appreciate medicine and infectious disease recommendations for antibiotic therapy. Depending on sensitivities patient could potentially likely go home on oral antibiotics for 10 days. Believe that likely all infection was removed with amputation. -Pathology report pending. -Still recommend for the most part patient be nonweightbearing at all times to the right lower extremity but he may use his heel for transfers and surgical shoe but needs to avoid all pressure for the most part on the right foot. -Appreciate physical therapy recommendations. -We will continue to follow-up with patient and likely change the dressing on Monday and if  sensitivities are back patient could potentially be discharged.  Podiatry team to continue to follow-up until discharge.  Caroline More, DPM 10/08/2020, 12:05 PM

## 2020-10-08 NOTE — Progress Notes (Addendum)
[facility] PROGRESS NOTE   [name] OFB:[id number] DOB: 01/27/1961 DOA: 10/05/2020  PCP: Patient, No Pcp Per  Brief History/Interval Summary: 59 y.o. male with medical history significant for recurrent diabetic foot ulcers, osteomyelitis of the left foot, type 2 diabetes mellitus with most recent hemoglobin A1c 7.3% in June 2021, hyperlipidemia, who was admitted to [facility] on 10/05/2020 with sepsis due to right lower extremity cellulitis after presenting from home to [facility] Emergency Department complaining of right foot erythema.   Patient was seen by podiatry.  Plan is for surgical intervention on Friday.  Patient underwent MRI of the right foot.   Reason for Visit: Right foot cellulitis and concern for osteomyelitis of the fifth metatarsal  Consultants: Podiatry, Dr. Luana Shu  Procedures:  10/29: 1. 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 Rotational skin flap closure right foot  Antibiotics: Anti-infectives (From admission, onward)   Start     Dose/Rate Route Frequency Ordered Stop   10/07/20 1704  vancomycin (VANCOCIN) powder  Status:  Discontinued          As needed 10/07/20 1704 10/07/20 1736   10/06/20 0600  vancomycin (VANCOCIN) IVPB 1000 mg/200 mL premix        1,000 mg 200 mL/hr over 60 Minutes Intravenous Every 12 hours 10/05/20 2026     10/05/20 1530  vancomycin (VANCOREADY) IVPB 2000 mg/400 mL        2,000 mg 200 mL/hr over 120 Minutes Intravenous  Once 10/05/20 1445 10/05/20 1718   10/05/20 1445  vancomycin (VANCOREADY) IVPB 1750 mg/350 mL  Status:  Discontinued        1,750 mg 175 mL/hr over 120 Minutes Intravenous  Once 10/05/20 1432 10/05/20 1443      Subjective/Interval History: Patient mentions that pain is reasonably well controlled at 4-5 out of 10 in intensity.  Denies any other complaints.  Looking forward to ambulating with physical therapy.      Assessment/Plan:  Cellulitis of the right foot with concern  for osteomyelitis of the fifth metatarsal with concern also for small abscess/sepsis, present on admission Patient had SIRS criteria at admission including tachycardia leukocytosis.  Sepsis physiology has resolved. Patient noted to have erythema of the right foot.  MRI raises concern for a small abscess concern for osteomyelitis involving the metatarsal head.  Patient started on vancomycin.  Seen by podiatry. 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 Wound cultures growing staph aureus.  Blood cultures are negative so far.  Discussed with ID regarding transitioning to oral antibiotics.  Currently on vancomycin.   BC had improved but noted to be higher today which could be reactive from the surgery that he underwent yesterday.  ESR was noted to be 87.  CRP noted to be 18.9.  Lactic acid level was normal.  Discussed with ID regarding antibiotics. They recommend Ancef or Keflex. Allergy noted to Amox. Discussed with patient as well. It was rash about 8 years ago but it was unclear even then if it was due to Amox. Doesn't know if he has tolerated cephalosporins but is willing to try. Discussed with Dr. Luana Shu. Will switch to Keflex. Anticipate discharge Monday per Dr. Luana Shu.  Mild dehydration/prerenal azotemia Patient had mildly elevated BUN.  This has improved with IV hydration.  Diabetes mellitus type 2, uncontrolled with hyperglycemia HbA1c was 7.3 in June.  Noted to be 9.6 during this hospital stay.  Patient on glipizide Metformin and Tresiba at home.  Currently on Levemir and SSI.  CBGs remain poorly controlled.  Will increase the dose of Levemir today.  Hyperlipidemia Continue Lipitor.  Normocytic anemia Likely due to acute illness.  No evidence for acute loss.  Hyponatremia Stable.  Continue to monitor.  Obesity Estimated body mass index is 32.68 kg/m as calculated from the following:   Height as of this encounter: 6' (1.829 m).   Weight as of this encounter: 109.3  kg.   DVT Prophylaxis: Lovenox Code Status: FullCode Family Communication: Discussed with patient Disposition Plan: Hopefully return home when improved.  PT and OT evaluation  Status is: Inpatient  Remains inpatient appropriate because:IV treatments appropriate due to intensity of illness or inability to take PO and Inpatient level of care appropriate due to severity of illness   Dispo: The patient is from: Home              Anticipated d/c is to: Home              Anticipated d/c date is: 2 days              Patient currently is not medically stable to d/c.    Medications:  Scheduled: . vitamin C  250 mg Oral BID  . atorvastatin  20 mg Oral QODAY  . enoxaparin (LOVENOX) injection  0.5 mg/kg Subcutaneous Q24H  . insulin aspart  0-9 Units Subcutaneous TID WC  . insulin detemir  8 Units Subcutaneous QHS  . multivitamin with minerals  1 tablet Oral Daily  . Ensure Max Protein  11 oz Oral BID   Continuous: . vancomycin 1,000 mg (10/08/20 0636)   NOI:[id number] **OR** acetaminophen, fentaNYL (SUBLIMAZE) injection, melatonin, ondansetron **OR** ondansetron (ZOFRAN) IV   Objective:  Vital Signs  Vitals:   10/07/20 1823 10/08/20 0457 10/08/20 0500 10/08/20 1046  BP:  133/77  138/81  Pulse: 68 69  77  Resp: _0 Temp: (!) 97 F (36.1 C) (!) 97.5 F (36.4 C)  98.5 F (36.9 C)  TempSrc:  Oral  Oral  SpO2: 93% 92%  96%  Weight:   109.3 kg   Height:        Intake/Output Summary (Last 24 hours) at 10/08/2020 1128 Last data filed at 10/08/2020 1031 Gross per 24 hour  Intake 1880 ml  Output 1220 ml  Net 660 ml   Filed Weights   10/05/20 1240 10/07/20 0500 10/08/20 0500  Weight: 110.7 kg 108.1 kg 109.3 kg    General appearance: Awake alert.  In no distress Resp: Clear to auscultation bilaterally.  Normal effort Cardio: S1-S2 is normal regular.  No S3-S4.  No rubs murmurs or bruit GI: Abdomen is soft.  Nontender nondistended.  Bowel sounds are present  normal.  No masses organomegaly Extremities: No edema.  Right foot noted to be in a dressing. Neurologic: Alert and oriented x3.  No focal neurological deficits.     Lab Results:  Data Reviewed: I have personally reviewed following labs and imaging studies  CBC: Recent Labs  Lab 10/05/20 1444 10/06/20 0440 10/07/20 0423 10/08/20 0507  WBC 15.3* 12.6* 11.9* 13.8*  NEUTROABS 13.3* 9.9*  --   --   HGB 12.4* 11.8* 11.3* 11.8*  HCT 35.3* 33.2* 32.4* 33.5*  MCV 91.7 92.0 91.5 91.3  PLT 212 185 209 038    Basic Metabolic Panel: Recent Labs  Lab 10/05/20 1444 10/06/20 0440 10/07/20 0423 10/08/20 0507  NA 131* 135 135 133*  K 4.0 3.7 3.8 4.3  CL 95* 98 98 99  CO2 _0 GLUCOSE 324* 170* 199* 355*  BUN 32* _1 CREATININE 1.06 0.68 0.64 0.77  CALCIUM 8.3* 8.3* 8.4* 7.9*  MG 2.0 1.8  --   --     GFR: Estimated Creatinine Clearance: 130.1 mL/min (by C-G formula based on SCr of 0.77 mg/dL).  Liver Function Tests: Recent Labs  Lab 10/05/20 1444 10/06/20 0440  AST 25 19  ALT 21 17  ALKPHOS 57 52  BILITOT 1.2 1.0  PROT 7.3 6.8  ALBUMIN 3.5 3.1*     Coagulation Profile: Recent Labs  Lab 10/05/20 1444  INR 1.3*    HbA1C: Recent Labs    10/05/20 1444 10/06/20 0440  HGBA1C 9.6* 9.6*    CBG: Recent Labs  Lab 10/07/20 1138 10/07/20 1544 10/07/20 1746 10/07/20 2122 10/08/20 0832  GLUCAP 179* 124* 138* 223* 333*     Recent Results (from the past 240 hour(s))  Aerobic Culture (superficial specimen)     Status: None   Collection Time: 10/05/20  9:52 AM   Specimen: Foot; Wound  Result Value Ref Range Status   Specimen Description   Final    FOOT RIGHT Performed at [facility], [address], [place], [place] [address]    Special Requests   Final    Normal Performed at [facility] [place] [place], [place] [address]    Gram Stain   Final    FEW WBC PRESENT, PREDOMINANTLY PMN FEW GRAM POSITIVE  COCCI IN CLUSTERS    Culture   Final    MODERATE STAPHYLOCOCCUS AUREUS SUSCEPTIBILITIES PERFORMED ON PREVIOUS CULTURE WITHIN THE LAST 5 DAYS. Performed at [facility], [place] [address], [place], [place] [address]    Report Status 10/07/2020 FINAL  Final  Aerobic Culture (superficial specimen)     Status: None   Collection Time: 10/05/20  9:52 AM   Specimen: Foot; Wound  Result Value Ref Range Status   Specimen Description   Final    FOOT RIGHT SOLE Performed at [facility], [address] [place], [place] [address]    Special Requests   Final    Normal Performed at [facility] [place] [place], [place] [address]    Gram Stain   Final    RARE WBC PRESENT, PREDOMINANTLY PMN FEW GRAM POSITIVE COCCI IN PAIRS IN CLUSTERS Performed at [facility], [place] [address] [place] [address], [place], [place] [address]    Culture MODERATE STAPHYLOCOCCUS AUREUS  Final   Report Status 10/07/2020 FINAL  Final   Organism ID, Bacteria STAPHYLOCOCCUS AUREUS  Final      Susceptibility   Staphylococcus aureus - MIC*    CIPROFLOXACIN <=0.5 SENSITIVE Sensitive     ERYTHROMYCIN <=0.25 SENSITIVE Sensitive     GENTAMICIN <=0.5 SENSITIVE Sensitive     OXACILLIN <=0.25 SENSITIVE Sensitive     TETRACYCLINE <=1 SENSITIVE Sensitive     VANCOMYCIN 1 SENSITIVE Sensitive     TRIMETH/SULFA <=10 SENSITIVE Sensitive     CLINDAMYCIN <=0.25 SENSITIVE Sensitive     RIFAMPIN <=0.5 SENSITIVE Sensitive     Inducible Clindamycin NEGATIVE Sensitive     * MODERATE STAPHYLOCOCCUS AUREUS  Culture, blood (routine x 2)     Status: None (Preliminary result)   Collection Time: 10/05/20  2:44 PM   Specimen: BLOOD  Result Value Ref Range Status   Specimen Description BLOOD BLOOD LEFT FOREARM  Final   Special Requests  Final    BOTTLES DRAWN AEROBIC AND ANAEROBIC Blood Culture results may not be optimal due to an inadequate volume of blood received in culture bottles   Culture   Final    NO  GROWTH 3 DAYS Performed at [facility], [place] [place], [place] [address]    Report Status PENDING  Incomplete  Culture, blood (routine x 2)     Status: None (Preliminary result)   Collection Time: 10/05/20  2:44 PM   Specimen: BLOOD  Result Value Ref Range Status   Specimen Description BLOOD BLOOD RIGHT FOREARM  Final   Special Requests   Final    BOTTLES DRAWN AEROBIC AND ANAEROBIC Blood Culture adequate volume   Culture   Final    NO GROWTH 3 DAYS Performed at [facility], [address] [place], [place] [address]    Report Status PENDING  Incomplete  Respiratory Panel by RT PCR (Flu A&B, Covid) - Nasopharyngeal Swab     Status: None   Collection Time: 10/05/20  2:44 PM   Specimen: Nasopharyngeal Swab  Result Value Ref Range Status   SARS Coronavirus 2 by RT PCR NEGATIVE NEGATIVE Final    Comment: (NOTE) SARS-CoV-2 target nucleic acids are NOT DETECTED.  The SARS-CoV-2 RNA is generally detectable in upper respiratoy specimens during the acute phase of infection. The lowest concentration of SARS-CoV-2 viral copies this assay can detect is 131 copies/mL. A negative result does not preclude SARS-Cov-2 infection and should not be used as the sole basis for treatment or other patient management decisions. A negative result may occur with  improper specimen collection/handling, submission of specimen other than nasopharyngeal swab, presence of viral mutation(s) within the areas targeted by this assay, and inadequate number of viral copies (<131 copies/mL). A negative result must be combined with clinical observations, patient history, and epidemiological information. The expected result is Negative.  Fact Sheet for Patients:  PinkCheek.be  Fact Sheet for Healthcare Providers:  GravelBags.it  This test is no t yet approved or cleared by the [place] FDA and  has been authorized for  detection and/or diagnosis of SARS-CoV-2 by FDA under an Emergency Use Authorization (EUA). This EUA will remain  in effect (meaning this test can be used) for the duration of the COVID-19 declaration under Section 564(b)(1) of the Act, 21 U.S.C. section 360bbb-3(b)(1), unless the authorization is terminated or revoked sooner.     Influenza A by PCR NEGATIVE NEGATIVE Final   Influenza B by PCR NEGATIVE NEGATIVE Final    Comment: (NOTE) The Xpert Xpress SARS-CoV-2/FLU/RSV assay is intended as an aid in  the diagnosis of influenza from Nasopharyngeal swab specimens and  should not be used as a sole basis for treatment. Nasal washings and  aspirates are unacceptable for Xpert Xpress SARS-CoV-2/FLU/RSV  testing.  Fact Sheet for Patients: PinkCheek.be  Fact Sheet for Healthcare Providers: GravelBags.it  This test is not yet approved or cleared by the [place] FDA and  has been authorized for detection and/or diagnosis of SARS-CoV-2 by  FDA under an Emergency Use Authorization (EUA). This EUA will remain  in effect (meaning this test can be used) for the duration of the  Covid-19 declaration under Section 564(b)(1) of the Act, 21  U.S.C. section 360bbb-3(b)(1), unless the authorization is  terminated or revoked. Performed at [facility], [address] [place], [place] [address]   Aerobic/Anaerobic Culture (surgical/deep wound)     Status: None (Preliminary result)   Collection Time:  10/05/20  6:56 PM   Specimen: Wound  Result Value Ref Range Status   Specimen Description   Final    WOUND Performed at [facility], [address] [place], [place] [address]    Special Requests   Final    NONE Performed at [facility], [address] [place], [place] [address]    Gram Stain   Final    NO WBC SEEN MODERATE GRAM POSITIVE COCCI Performed at [facility], [place] [address]  [place], [place] [address]    Culture   Final    ABUNDANT STAPHYLOCOCCUS AUREUS NO ANAEROBES ISOLATED; CULTURE IN PROGRESS FOR 5 DAYS    Report Status PENDING  Incomplete   Organism ID, Bacteria STAPHYLOCOCCUS AUREUS  Final      Susceptibility   Staphylococcus aureus - MIC*    CIPROFLOXACIN <=0.5 SENSITIVE Sensitive     ERYTHROMYCIN <=0.25 SENSITIVE Sensitive     GENTAMICIN <=0.5 SENSITIVE Sensitive     OXACILLIN <=0.25 SENSITIVE Sensitive     TETRACYCLINE <=1 SENSITIVE Sensitive     VANCOMYCIN 1 SENSITIVE Sensitive     TRIMETH/SULFA <=10 SENSITIVE Sensitive     CLINDAMYCIN <=0.25 SENSITIVE Sensitive     RIFAMPIN <=0.5 SENSITIVE Sensitive     Inducible Clindamycin NEGATIVE Sensitive     * ABUNDANT STAPHYLOCOCCUS AUREUS  Aerobic/Anaerobic Culture (surgical/deep wound)     Status: None (Preliminary result)   Collection Time: 10/07/20  5:06 PM   Specimen: PATH Digit amputation; Tissue  Result Value Ref Range Status   Specimen Description TISSUE  Final   Special Requests 5TH METATARSAL  Final   Gram Stain NO WBC SEEN NO ORGANISMS SEEN   Final   Culture   Final    NO GROWTH < 12 HOURS Performed at [facility], [place] [address] [place], [place] [address]    Report Status PENDING  Incomplete      Radiology Studies: DG Foot Complete Right  Result Date: 10/07/2020 CLINICAL DATA:  Postoperative radiograph following amputation of the fifth ray EXAM: RIGHT FOOT COMPLETE - 3+ VIEW COMPARISON:  Radiograph 10/06/2019, MRI 10/05/2020 FINDINGS: There are postsurgical changes from amputation of the fifth ray at the level of the proximal fifth metatarsal diaphysis with overlying soft tissue changes and bandaging material. First ray is in notable extension with additional clawtoe deformities of the second through fourth rays similar to comparison. Slight angulation across the second D IP is similar to prior. No acute complication is evident. No new sites of ulceration or concerning acute  osseous abnormality. Stable degenerative changes throughout the foot and imaged ankle. Bidirectional calcaneal spurs. IMPRESSION: Postsurgical changes from amputation of the fifth ray at the level of the proximal fifth metatarsal diaphysis. No acute complication is evident. Electronically Signed   By: Lovena Le M.D.   On: 10/07/2020 18:37   DG MINI C-ARM IMAGE ONLY  Result Date: 10/07/2020 There is no interpretation for this exam.  This order is for images obtained during a surgical procedure.  Please See "Surgeries" Tab for more information regarding the procedure.       LOS: 3 days   Taleigh Gero [employer] on [email or web address]  10/08/2020, 11:28 AM

## 2020-10-08 NOTE — Progress Notes (Signed)
Physical Therapy Evaluation Patient Details Name: [name] MRN: [id number] DOB: [birth year] Today's Date: 10/08/2020   History of Present Illness  Per MD note:[name]  has presented today for surgery, with the diagnosis of right 5th MTPJ osteomyelitis with associated abscess and cellulitis.    Clinical Impression  Patient agrees to PT evaluation. Pt reports no pain. Pt lives alone with a 3 step entry into his home. Pt ambulated without AD prior to this hospital admission. Pt has 3/5 strength BLE hip and knee and is MI for bed mobility, MI for transfers sit to stand with RW. Pt ambulates with RW 10 feet with MI assist, NWB and no reports of pain. Pt has WNL static sitting balance and good static/dynamic standing balance and needs UE support with RW. Pt will continue to benefit from skilled PT to improve mobility and strength.    Follow Up Recommendations Home health PT    Equipment Recommendations  Rolling walker with 5" wheels    Recommendations for Other Services       Precautions / Restrictions Precautions Precautions: Other (comment) Required Braces or Orthoses: Other Brace Other Brace: post op shoe Restrictions Weight Bearing Restrictions: Yes RLE Weight Bearing: Non weight bearing      Mobility  Bed Mobility Overal bed mobility: Independent                  Transfers Overall transfer level: Modified independent Equipment used: Rolling walker (2 wheeled)             General transfer comment:  (VC for safety)  Ambulation/Gait Ambulation/Gait assistance: Modified independent (Device/Increase time) Gait Distance (Feet): 10 Feet Assistive device: Rolling walker (2 wheeled)       General Gait Details: hopping  Stairs            Wheelchair Mobility    Modified Rankin (Stroke Patients Only)       Balance Overall balance assessment: Modified Independent                                           Pertinent  Vitals/Pain Pain Assessment: No/denies pain    Home Living Family/patient expects to be discharged to:: Private residence Living Arrangements: Other relatives Available Help at Discharge: Friend(s) Type of Home: House Home Access: Stairs to enter Entrance Stairs-Rails: Right Entrance Stairs-Number of Steps: 3   Home Equipment: Cane - single point      Prior Function Level of Independence: Independent               Hand Dominance   Dominant Hand: Right    Extremity/Trunk Assessment   Upper Extremity Assessment Upper Extremity Assessment: Overall WFL for tasks assessed    Lower Extremity Assessment Lower Extremity Assessment: Overall WFL for tasks assessed       Communication   Communication: No difficulties  Cognition Arousal/Alertness: Awake/alert Behavior During Therapy: WFL for tasks assessed/performed Overall Cognitive Status: Within Functional Limits for tasks assessed                                        General Comments      Exercises     Assessment/Plan    PT Assessment Patient needs continued PT services  PT Problem List Decreased activity tolerance;Decreased mobility  PT Treatment Interventions Gait training;Functional mobility training;Therapeutic activities;Therapeutic exercise;Balance training    PT Goals (Current goals can be found in the Care Plan section)  Acute Rehab PT Goals Patient Stated Goal: to get a knee scooter PT Goal Formulation: With patient Time For Goal Achievement: 10/22/20 Potential to Achieve Goals: Good    Frequency Min 2X/week   Barriers to discharge Inaccessible home environment;Decreased caregiver support      Co-evaluation               AM-PAC PT "6 Clicks" Mobility  Outcome Measure Help needed turning from your back to your side while in a flat bed without using bedrails?: None Help needed moving from lying on your back to sitting on the side of a flat bed without using  bedrails?: None Help needed moving to and from a bed to a chair (including a wheelchair)?: None Help needed standing up from a chair using your arms (e.g., wheelchair or bedside chair)?: A Little Help needed to walk in hospital room?: A Little Help needed climbing 3-5 steps with a railing? : A Little 6 Click Score: 21    End of Session Equipment Utilized During Treatment: Gait belt Activity Tolerance: Patient tolerated treatment well Patient left: in bed;with bed alarm set Nurse Communication: Mobility status PT Visit Diagnosis: Unsteadiness on feet (R26.81);Muscle weakness (generalized) (M62.81);Difficulty in walking, not elsewhere classified (R26.2)    Time: 1010-1030 PT Time Calculation (min) (ACUTE ONLY): 20 min   Charges:   PT Evaluation $PT Eval Low Complexity: 1 Low PT Treatments $Gait Training: 8-22 mins          [name], PT DPT 10/08/2020, 10:56 AM

## 2020-10-09 DIAGNOSIS — L089 Local infection of the skin and subcutaneous tissue, unspecified: Secondary | ICD-10-CM

## 2020-10-09 DIAGNOSIS — T148XXA Other injury of unspecified body region, initial encounter: Secondary | ICD-10-CM

## 2020-10-09 LAB — BASIC METABOLIC PANEL
Anion gap: 3 — ABNORMAL LOW (ref 5–15)
BUN: 23 mg/dL — ABNORMAL HIGH (ref 6–20)
CO2: 29 mmol/L (ref 22–32)
Calcium: 8.4 mg/dL — ABNORMAL LOW (ref 8.9–10.3)
Chloride: 105 mmol/L (ref 98–111)
Creatinine, Ser: 0.74 mg/dL (ref 0.61–1.24)
GFR, Estimated: >60 mL/min (ref >60)
Glucose, Bld: 106 mg/dL — ABNORMAL HIGH (ref 70–99)
Potassium: 3.8 mmol/L (ref 3.5–5.1)
Sodium: 137 mmol/L (ref 135–145)

## 2020-10-09 LAB — CBC
HCT: 32.7 % — ABNORMAL LOW (ref 39.0–52.0)
Hemoglobin: 11.5 g/dL — ABNORMAL LOW (ref 13.0–17.0)
MCH: 32.4 pg (ref 26.0–34.0)
MCHC: 35.2 g/dL (ref 30.0–36.0)
MCV: 92.1 fL (ref 80.0–100.0)
Platelets: 254 10*3/uL (ref 150–400)
RBC: 3.55 MIL/uL — ABNORMAL LOW (ref 4.22–5.81)
RDW: 11.6 % (ref 11.5–15.5)
WBC: 11.5 10*3/uL — ABNORMAL HIGH (ref 4.0–10.5)
nRBC: 0 % (ref 0.0–0.2)

## 2020-10-09 LAB — GLUCOSE, CAPILLARY
Glucose-Capillary: 113 mg/dL — ABNORMAL HIGH (ref 70–99)
Glucose-Capillary: 183 mg/dL — ABNORMAL HIGH (ref 70–99)
Glucose-Capillary: 250 mg/dL — ABNORMAL HIGH (ref 70–99)
Glucose-Capillary: 207 mg/dL — ABNORMAL HIGH (ref 70–99)

## 2020-10-09 MED ORDER — ACETAMINOPHEN 325 MG PO TABS: 650.0000 mg | ORAL_TABLET | Freq: Four times a day (QID) | ORAL | Status: DC | PRN

## 2020-10-09 MED ORDER — OXYCODONE-ACETAMINOPHEN 5-325 MG PO TABS
1.0000 | ORAL_TABLET | ORAL | Status: DC | PRN
  Administered 2020-10-09: 12:00:00 1 via ORAL
  Filled 2020-10-09: qty 1

## 2020-10-09 MED ORDER — IBUPROFEN 400 MG PO TABS: 400.0000 mg | ORAL_TABLET | Freq: Four times a day (QID) | ORAL | Status: DC | PRN

## 2020-10-09 NOTE — Discharge Instructions (Signed)
Podiatry discharge instructions: 1.  Keep surgical dressings clean, dry, and intact until postoperative appointment.  Contact clinic if dressing becomes saturated, loosened, or other concerns arise. 2.  Take postoperative pain medication as prescribed per internal medicine recommendations.  Continue taking antibiotics until gone. 3.  Try to stay off of the surgical extremity is much as possible.  If putting weight on the foot make sure to put weight on the heel only.  The more that you are up and moving around on the foot the more likely it is for the wound/incision not heal correctly. 4.  Continue working on trying to control your blood sugars as much possible as more controlled blood sugars will likely lead to the wound healing more quickly and can reduce complications long-term. 5.  Please follow-up in clinic with Dr. Excell Seltzer at [place] [facility] within 1 week of your discharge date.

## 2020-10-09 NOTE — Progress Notes (Signed)
[facility] PROGRESS NOTE   Lolita LenzJeffrey Currier ZOX:[id number] DOB: 10/05/1961 DOA: 10/05/2020  PCP: Patient, No Pcp Per  Brief History/Interval Summary: 59 y.o. male with medical history significant for recurrent diabetic foot ulcers, osteomyelitis of the left foot, type 2 diabetes mellitus with most recent hemoglobin A1c 7.3% in June 2021, hyperlipidemia, who was admitted to [facility] on 10/05/2020 with sepsis due to right lower extremity cellulitis after presenting from home to [facility] complaining of right foot erythema.   Patient was seen by podiatry.  Plan is for surgical intervention on Friday.  Patient underwent MRI of the right foot.   Reason for Visit: Right foot cellulitis and osteomyelitis of the fifth metatarsal  Consultants: Podiatry, Dr. Excell SeltzerBaker  Procedures:  10/29: 1. 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 Rotational skin flap closure right foot  Antibiotics: Anti-infectives (From admission, onward)   Start     Dose/Rate Route Frequency Ordered Stop   10/08/20 1400  cephALEXin (KEFLEX) capsule 500 mg        500 mg Oral Every 6 hours 10/08/20 1321     10/07/20 1704  vancomycin (VANCOCIN) powder  Status:  Discontinued          As needed 10/07/20 1704 10/07/20 1736   10/06/20 0600  vancomycin (VANCOCIN) IVPB 1000 mg/200 mL premix  Status:  Discontinued        1,000 mg 200 mL/hr over 60 Minutes Intravenous Every 12 hours 10/05/20 2026 10/08/20 1321   10/05/20 1530  vancomycin (VANCOREADY) IVPB 2000 mg/400 mL        2,000 mg 200 mL/hr over 120 Minutes Intravenous  Once 10/05/20 1445 10/05/20 1718   10/05/20 1445  vancomycin (VANCOREADY) IVPB 1750 mg/350 mL  Status:  Discontinued        1,750 mg 175 mL/hr over 120 Minutes Intravenous  Once 10/05/20 1432 10/05/20 1443      Subjective/Interval History: Patient denies any new complaints.  Pain in the right foot is reasonably well controlled.  No nausea vomiting.  No  reaction noted to Keflex.    Assessment/Plan:  Cellulitis of the right foot with osteomyelitis of the fifth metatarsal with small abscess/sepsis, present on admission Patient had SIRS criteria at admission including tachycardia leukocytosis.  Sepsis physiology has resolved. Patient noted to have erythema of the right foot.  MRI raises concern for a small abscess concern for osteomyelitis involving the metatarsal head.  Patient started on vancomycin.  Seen by podiatry. 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 Wound cultures growing staph aureus.  Blood cultures are negative so far.  Discussed with ID regarding transitioning to oral antibiotics.  Sensitivities reviewed.  Allergies reviewed with patient.  Reported allergy noted to amoxicillin which caused a rash about 8 years ago.  It was even not clear at that time whether the rash was secondary to amoxicillin or not.  Patient agreeable to use start Keflex which was initiated yesterday.  No reaction noted so far. WBC has improved this morning.  He is afebrile. Discussed with Dr. Glenis SmokerBeeker yesterday.  Plan is for discharge tomorrow.  Mild dehydration/prerenal azotemia Patient had mildly elevated BUN.  This has improved with IV hydration.  Diabetes mellitus type 2, uncontrolled with hyperglycemia HbA1c was 7.3 in June.  Noted to be 9.6 during this hospital stay.  Patient on glipizide Metformin and Tresiba at home.  Currently on Levemir and SSI.   Dose of Levemir was increased yesterday due to elevated glucose levels.  Noted  to be improved this morning.  Continue to monitor.  Hyperlipidemia Continue Lipitor.  Normocytic anemia Likely due to acute illness.  No evidence for acute loss.  Hyponatremia Improved.  Obesity Estimated body mass index is 32.92 kg/m as calculated from the following:   Height as of this encounter: 6' (1.829 m).   Weight as of this encounter: 110.1 kg.   DVT Prophylaxis: Lovenox Code Status:  FullCode Family Communication: Discussed with patient Disposition Plan: Home health recommended by physical therapy.  Status is: Inpatient  Remains inpatient appropriate because:IV treatments appropriate due to intensity of illness or inability to take PO and Inpatient level of care appropriate due to severity of illness   Dispo: The patient is from: Home              Anticipated d/c is to: Home              Anticipated d/c date is: 11/1              Patient currently is not medically stable to d/c.    Medications:  Scheduled: . vitamin C  250 mg Oral BID  . atorvastatin  20 mg Oral QODAY  . cephALEXin  500 mg Oral Q6H  . enoxaparin (LOVENOX) injection  0.5 mg/kg Subcutaneous Q24H  . insulin aspart  0-20 Units Subcutaneous TID WC  . insulin aspart  0-5 Units Subcutaneous QHS  . insulin detemir  10 Units Subcutaneous BID  . multivitamin with minerals  1 tablet Oral Daily  . Ensure Max Protein  11 oz Oral BID   Continuous:  YNW:[id number] **OR** [DISCONTINUED] acetaminophen, ibuprofen, melatonin, ondansetron **OR** ondansetron (ZOFRAN) IV, oxyCODONE-acetaminophen   Objective:  Vital Signs  Vitals:   10/09/20 0031 10/09/20 0421 10/09/20 0500 10/09/20 0802  BP: 132/76 140/73  131/74  Pulse: 74 71  71  Resp: Temp: 98.2 F (36.8 C) 98.2 F (36.8 C)  98.4 F (36.9 C)  TempSrc: Oral Oral  Oral  SpO2: 96% 97%  96%  Weight:   110.1 kg   Height:        Intake/Output Summary (Last 24 hours) at 10/09/2020 1043 Last data filed at 10/09/2020 1024 Gross per 24 hour  Intake 480 ml  Output 2350 ml  Net -1870 ml   Filed Weights   10/07/20 0500 10/08/20 0500 10/09/20 0500  Weight: 108.1 kg 109.3 kg 110.1 kg    General appearance: Awake alert.  In no distress Resp: Clear to auscultation bilaterally.  Normal effort Cardio: S1-S2 is normal regular.  No S3-S4.  No rubs murmurs or bruit GI: Abdomen is soft.  Nontender nondistended.  Bowel sounds are present  normal.  No masses organomegaly Extremities: No edema.  Right foot covered in a dressing Neurologic: Alert and oriented x3.  No focal neurological deficits.     Lab Results:  Data Reviewed: I have personally reviewed following labs and imaging studies  CBC: Recent Labs  Lab 10/05/20 1444 10/06/20 0440 10/07/20 0423 10/08/20 0507 10/09/20 0412  WBC 15.3* 12.6* 11.9* 13.8* 11.5*  NEUTROABS 13.3* 9.9*  --   --   --   HGB 12.4* 11.8* 11.3* 11.8* 11.5*  HCT 35.3* 33.2* 32.4* 33.5* 32.7*  MCV 91.7 92.0 91.5 91.3 92.1  PLT 212 185 209 236 254    Basic Metabolic Panel: Recent Labs  Lab 10/05/20 1444 10/06/20 0440 10/07/20 0423 10/08/20 0507 10/09/20 0412  NA 131* 135 135 133* 137  K 4.0 3.7 3.8  4.3 3.8  CL 95* 98 98 99 105  CO2 24 27 29 24 29   GLUCOSE 324* 170* 199* 355* 106*  BUN 32* 17 14 19  23*  CREATININE 1.06 0.68 0.64 0.77 0.74  CALCIUM 8.3* 8.3* 8.4* 7.9* 8.4*  MG 2.0 1.8  --   --   --     GFR: Estimated Creatinine Clearance: 130.6 mL/min (by C-G formula based on SCr of 0.74 mg/dL).  Liver Function Tests: Recent Labs  Lab 10/05/20 1444 10/06/20 0440  AST 25 19  ALT 21 17  ALKPHOS 57 52  BILITOT 1.2 1.0  PROT 7.3 6.8  ALBUMIN 3.5 3.1*     Coagulation Profile: Recent Labs  Lab 10/05/20 1444  INR 1.3*    HbA1C: No results for input(s): HGBA1C in the last 72 hours.  CBG: Recent Labs  Lab 10/08/20 0832 10/08/20 1229 10/08/20 1623 10/08/20 2114 10/09/20 0759  GLUCAP 333* 313* 410* 236* 113*     Recent Results (from the past 240 hour(s))  Aerobic Culture (superficial specimen)     Status: None   Collection Time: 10/05/20  9:52 AM   Specimen: Foot; Wound  Result Value Ref Range Status   Specimen Description   Final    FOOT RIGHT Performed at [facility], [address], [place], [place]    Special Requests   Final    Normal Performed at [facility], [address], [place], [place]     Gram Stain   Final    FEW WBC PRESENT, PREDOMINANTLY PMN FEW GRAM POSITIVE COCCI IN CLUSTERS    Culture   Final    MODERATE STAPHYLOCOCCUS AUREUS SUSCEPTIBILITIES PERFORMED ON PREVIOUS CULTURE WITHIN THE LAST 5 DAYS. Performed at [facility], [address], [place], [address] [place]    Report Status 10/07/2020 FINAL  Final  Aerobic Culture (superficial specimen)     Status: None   Collection Time: 10/05/20  9:52 AM   Specimen: Foot; Wound  Result Value Ref Range Status   Specimen Description   Final    FOOT RIGHT SOLE Performed at [facility], [address], [place], [place]    Special Requests   Final    Normal Performed at [facility], [address], [place], [place]    Gram Stain   Final    RARE WBC PRESENT, PREDOMINANTLY PMN FEW GRAM POSITIVE COCCI IN PAIRS IN CLUSTERS Performed at [facility], [address], [place], [address] [place]    Culture MODERATE STAPHYLOCOCCUS AUREUS  Final   Report Status 10/07/2020 FINAL  Final   Organism ID, Bacteria STAPHYLOCOCCUS AUREUS  Final      Susceptibility   Staphylococcus aureus - MIC*    CIPROFLOXACIN <=0.5 SENSITIVE Sensitive     ERYTHROMYCIN <=0.25 SENSITIVE Sensitive     GENTAMICIN <=0.5 SENSITIVE Sensitive     OXACILLIN <=0.25 SENSITIVE Sensitive     TETRACYCLINE <=1 SENSITIVE Sensitive     VANCOMYCIN 1 SENSITIVE Sensitive     TRIMETH/SULFA <=10 SENSITIVE Sensitive     CLINDAMYCIN <=0.25 SENSITIVE Sensitive     RIFAMPIN <=0.5 SENSITIVE Sensitive     Inducible Clindamycin NEGATIVE Sensitive     * MODERATE STAPHYLOCOCCUS AUREUS  Culture, blood (routine x 2)     Status: None (Preliminary result)   Collection Time: 10/05/20  2:44 PM   Specimen: BLOOD  Result Value Ref Range Status   Specimen  Description BLOOD BLOOD LEFT FOREARM  Final   Special Requests   Final    BOTTLES DRAWN AEROBIC AND ANAEROBIC Blood Culture results may not be optimal due to an  inadequate volume of blood received in culture bottles   Culture   Final    NO GROWTH 4 DAYS Performed at [facility], [address], [place], [place] [address]    Report Status PENDING  Incomplete  Culture, blood (routine x 2)     Status: None (Preliminary result)   Collection Time: 10/05/20  2:44 PM   Specimen: BLOOD  Result Value Ref Range Status   Specimen Description BLOOD BLOOD RIGHT FOREARM  Final   Special Requests   Final    BOTTLES DRAWN AEROBIC AND ANAEROBIC Blood Culture adequate volume   Culture   Final    NO GROWTH 4 DAYS Performed at [facility], [address], [place], [place] [address]    Report Status PENDING  Incomplete  Respiratory Panel by RT PCR (Flu A&B, Covid) - Nasopharyngeal Swab     Status: None   Collection Time: 10/05/20  2:44 PM   Specimen: Nasopharyngeal Swab  Result Value Ref Range Status   SARS Coronavirus 2 by RT PCR NEGATIVE NEGATIVE Final    Comment: (NOTE) SARS-CoV-2 target nucleic acids are NOT DETECTED.  The SARS-CoV-2 RNA is generally detectable in upper respiratoy specimens during the acute phase of infection. The lowest concentration of SARS-CoV-2 viral copies this assay can detect is 131 copies/mL. A negative result does not preclude SARS-Cov-2 infection and should not be used as the sole basis for treatment or other patient management decisions. A negative result may occur with  improper specimen collection/handling, submission of specimen other than nasopharyngeal swab, presence of viral mutation(s) within the areas targeted by this assay, and inadequate number of viral copies (<131 copies/mL). A negative result must be combined with clinical observations, patient history, and epidemiological information. The expected result is Negative.  Fact Sheet for Patients:  https://www.moore.com/  Fact Sheet for Healthcare Providers:  https://www.young.biz/  This test is no t  yet approved or cleared by the [place] FDA and  has been authorized for detection and/or diagnosis of SARS-CoV-2 by FDA under an Emergency Use Authorization (EUA). This EUA will remain  in effect (meaning this test can be used) for the duration of the COVID-19 declaration under Section 564(b)(1) of the Act, 21 U.S.C. section 360bbb-3(b)(1), unless the authorization is terminated or revoked sooner.     Influenza A by PCR NEGATIVE NEGATIVE Final   Influenza B by PCR NEGATIVE NEGATIVE Final    Comment: (NOTE) The Xpert Xpress SARS-CoV-2/FLU/RSV assay is intended as an aid in  the diagnosis of influenza from Nasopharyngeal swab specimens and  should not be used as a sole basis for treatment. Nasal washings and  aspirates are unacceptable for Xpert Xpress SARS-CoV-2/FLU/RSV  testing.  Fact Sheet for Patients: https://www.moore.com/  Fact Sheet for Healthcare Providers: https://www.young.biz/  This test is not yet approved or cleared by the [place] FDA and  has been authorized for detection and/or diagnosis of SARS-CoV-2 by  FDA under an Emergency Use Authorization (EUA). This EUA will remain  in effect (meaning this test can be used) for the duration of the  Covid-19 declaration under Section 564(b)(1) of the Act, 21  U.S.C. section 360bbb-3(b)(1), unless the authorization is  terminated or revoked. Performed at [facility], [address], [place], [place] [address]   Aerobic/Anaerobic Culture (surgical/deep  wound)     Status: None (Preliminary result)   Collection Time: 10/05/20  6:56 PM   Specimen: Wound  Result Value Ref Range Status   Specimen Description   Final    WOUND Performed at [facility], [address], [place], [place] [address]    Special Requests   Final    NONE Performed at [facility], [address] [place], [place] [address]    Gram Stain   Final    NO WBC SEEN MODERATE  GRAM POSITIVE COCCI Performed at [facility], [address] [place], [place] [address]    Culture   Final    ABUNDANT STAPHYLOCOCCUS AUREUS NO ANAEROBES ISOLATED; CULTURE IN PROGRESS FOR 5 DAYS    Report Status PENDING  Incomplete   Organism ID, Bacteria STAPHYLOCOCCUS AUREUS  Final      Susceptibility   Staphylococcus aureus - MIC*    CIPROFLOXACIN <=0.5 SENSITIVE Sensitive     ERYTHROMYCIN <=0.25 SENSITIVE Sensitive     GENTAMICIN <=0.5 SENSITIVE Sensitive     OXACILLIN <=0.25 SENSITIVE Sensitive     TETRACYCLINE <=1 SENSITIVE Sensitive     VANCOMYCIN 1 SENSITIVE Sensitive     TRIMETH/SULFA <=10 SENSITIVE Sensitive     CLINDAMYCIN <=0.25 SENSITIVE Sensitive     RIFAMPIN <=0.5 SENSITIVE Sensitive     Inducible Clindamycin NEGATIVE Sensitive     * ABUNDANT STAPHYLOCOCCUS AUREUS  Aerobic/Anaerobic Culture (surgical/deep wound)     Status: None (Preliminary result)   Collection Time: 10/07/20  5:06 PM   Specimen: PATH Digit amputation; Tissue  Result Value Ref Range Status   Specimen Description TISSUE  Final   Special Requests 5TH METATARSAL  Final   Gram Stain NO WBC SEEN NO ORGANISMS SEEN   Final   Culture   Final    NO GROWTH < 12 HOURS Performed at [facility], [address] [place], [place] [address]    Report Status PENDING  Incomplete      Radiology Studies: DG Foot Complete Right  Result Date: 10/07/2020 CLINICAL DATA:  Postoperative radiograph following amputation of the fifth ray EXAM: RIGHT FOOT COMPLETE - 3+ VIEW COMPARISON:  Radiograph 10/06/2019, MRI 10/05/2020 FINDINGS: There are postsurgical changes from amputation of the fifth ray at the level of the proximal fifth metatarsal diaphysis with overlying soft tissue changes and bandaging material. First ray is in notable extension with additional clawtoe deformities of the second through fourth rays similar to comparison. Slight angulation across the second D IP is similar to prior. No acute  complication is evident. No new sites of ulceration or concerning acute osseous abnormality. Stable degenerative changes throughout the foot and imaged ankle. Bidirectional calcaneal spurs. IMPRESSION: Postsurgical changes from amputation of the fifth ray at the level of the proximal fifth metatarsal diaphysis. No acute complication is evident. Electronically Signed   By: Kreg Shropshire M.D.   On: 10/07/2020 18:37   DG MINI C-ARM IMAGE ONLY  Result Date: 10/07/2020 There is no interpretation for this exam.  This order is for images obtained during a surgical procedure.  Please See "Surgeries" Tab for more information regarding the procedure.       LOS: 4 days   Misael Mcgaha [employer] on [email or web address]  10/09/2020, 10:43 AM

## 2020-10-10 LAB — CULTURE, BLOOD (ROUTINE X 2)
Culture: NO GROWTH
Culture: NO GROWTH
Special Requests: ADEQUATE

## 2020-10-10 LAB — CBC
HCT: 34.6 % — ABNORMAL LOW (ref 39.0–52.0)
Hemoglobin: 12 g/dL — ABNORMAL LOW (ref 13.0–17.0)
MCH: 31.8 pg (ref 26.0–34.0)
MCHC: 34.7 g/dL (ref 30.0–36.0)
MCV: 91.8 fL (ref 80.0–100.0)
Platelets: 256 10*3/uL (ref 150–400)
RBC: 3.77 MIL/uL — ABNORMAL LOW (ref 4.22–5.81)
RDW: 11.6 % (ref 11.5–15.5)
WBC: 10.7 10*3/uL — ABNORMAL HIGH (ref 4.0–10.5)
nRBC: 0 % (ref 0.0–0.2)

## 2020-10-10 LAB — BASIC METABOLIC PANEL
Anion gap: 7 (ref 5–15)
BUN: 20 mg/dL (ref 6–20)
CO2: 29 mmol/L (ref 22–32)
Calcium: 8.7 mg/dL — ABNORMAL LOW (ref 8.9–10.3)
Chloride: 100 mmol/L (ref 98–111)
Creatinine, Ser: 0.7 mg/dL (ref 0.61–1.24)
GFR, Estimated: >60 mL/min (ref >60)
Glucose, Bld: 169 mg/dL — ABNORMAL HIGH (ref 70–99)
Potassium: 4.1 mmol/L (ref 3.5–5.1)
Sodium: 136 mmol/L (ref 135–145)

## 2020-10-10 LAB — GLUCOSE, CAPILLARY: Glucose-Capillary: 164 mg/dL — ABNORMAL HIGH (ref 70–99)

## 2020-10-10 MED ORDER — SACCHAROMYCES BOULARDII 250 MG PO CAPS: 250.0000 mg | ORAL_CAPSULE | Freq: Two times a day (BID) | ORAL | 0 refills | Status: AC

## 2020-10-10 MED ORDER — CEPHALEXIN 500 MG PO CAPS: 500.0000 mg | ORAL_CAPSULE | Freq: Four times a day (QID) | ORAL | 0 refills | Status: AC

## 2020-10-10 MED ORDER — OXYCODONE-ACETAMINOPHEN 5-325 MG PO TABS: 1.0000 | ORAL_TABLET | Freq: Three times a day (TID) | ORAL | 0 refills | Status: DC | PRN

## 2020-10-10 MED ORDER — IBUPROFEN 400 MG PO TABS: 400.0000 mg | ORAL_TABLET | Freq: Four times a day (QID) | ORAL | 0 refills | Status: DC | PRN

## 2020-10-10 NOTE — Discharge Summary (Signed)
[facility]  Physician Discharge Summary   Patient ID: [name] MRN: [id number] DOB/AGE: [birth year] 59 y.o.  Admit date: 10/05/2020 Discharge date: 10/10/2020  PCP: Patient, No Pcp Per  DISCHARGE DIAGNOSES:  Right foot cellulitis with osteomyelitis of the fifth metatarsal with small abscess Diabetes mellitus type 2, uncontrolled with hyperglycemia Normocytic anemia Hyperlipidemia Obesity  RECOMMENDATIONS FOR OUTPATIENT FOLLOW UP: 1. Patient to follow-up with podiatry for further management of his right foot    Home Health: None. Patient declined Equipment/Devices: Environmental consultant  CODE STATUS: Full code  DISCHARGE CONDITION: fair  Diet recommendation: Modified carbohydrate  INITIAL HISTORY: 59 y.o.malewith medical history significant forrecurrent diabetic foot ulcers, osteomyelitis of the left foot, type 2 diabetes mellitus with most recent hemoglobin A1c 7.3% in June 2021, hyperlipidemia,who was admitted to [facility] on 10/27/2021with sepsis due to right lower extremity cellulitisafter presenting from home to [facility] Emergency Department complaining of right foot erythema.  Patient was seen by podiatry.  Plan is for surgical intervention on Friday.  Patient underwent MRI of the right foot.  Consultants: Podiatry, Dr. Luana Shu  Procedures:  10/29: 1. 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 Rotational skin flap closure right foot   HOSPITAL COURSE:   Cellulitis of the right foot with osteomyelitis of the fifth metatarsal with small abscess/sepsis, present on admission Patient had SIRS criteria at admission including tachycardia leukocytosis.  Sepsis physiology has resolved. Patient noted to have erythema of the right foot.  MRI raises concern for a small abscess concern for osteomyelitis involving the metatarsal head.  Patient started on vancomycin.  Seen by podiatry. Patient underwent partial fifth ray amputation on  10/29. Wound cultures growing staph aureus.  Blood cultures are negative so far.  Discussed with ID regarding transitioning to oral antibiotics.  Sensitivities reviewed.  Allergies reviewed with patient.  Reported allergy noted to amoxicillin which caused a rash about 8 years ago.  It was even not clear at that time whether the rash was secondary to amoxicillin or not.  Patient agreeable to use start Keflex which was initiated on 10/30.  No reaction noted so far. WBC continues to improve.  Is afebrile.  Does not report any side effects to the Keflex.  Per podiatry notes they have managed to get clean margins.  They think that they have resected all of the infectious area.  We will plan for 2 weeks of antibacterials.   Patient seen by physical therapy and has ambulated.  He will need a walker.  Mild dehydration/prerenal azotemia Patient had mildly elevated BUN.  This has improved with IV hydration.  Diabetes mellitus type 2, uncontrolled with hyperglycemia HbA1c was 7.3 in June.  Noted to be 9.6 during this hospital stay.  Patient on glipizide Metformin and Tresiba at home.    May resume his home medications  Hyperlipidemia Continue Lipitor.  Normocytic anemia Stable.  Hyponatremia Improved.  Obesity Estimated body mass index is 32.92 kg/m as calculated from the following:   Height as of this encounter: 6' (1.829 m).   Weight as of this encounter: 110.1 kg.   Patient remains stable.  Okay for discharge home today.   PERTINENT LABS:  The results of significant diagnostics from this hospitalization (including imaging, microbiology, ancillary and laboratory) are listed below for reference.    Microbiology: Recent Results (from the past 240 hour(s))  Aerobic Culture (superficial specimen)     Status: None   Collection Time: 10/05/20  9:52 AM   Specimen: Foot; Wound  Result Value  Ref Range Status   Specimen Description   Final    FOOT RIGHT Performed at [facility], [address] [place], [place] [address]    Special Requests   Final    Normal Performed at [facility], [address] [place], [place] [address]    Gram Stain   Final    FEW WBC PRESENT, PREDOMINANTLY PMN FEW GRAM POSITIVE COCCI IN CLUSTERS    Culture   Final    MODERATE STAPHYLOCOCCUS AUREUS SUSCEPTIBILITIES PERFORMED ON PREVIOUS CULTURE WITHIN THE LAST 5 DAYS. Performed at [facility], [place] [address], [place], [place] [address]    Report Status 10/07/2020 FINAL  Final  Aerobic Culture (superficial specimen)     Status: None   Collection Time: 10/05/20  9:52 AM   Specimen: Foot; Wound  Result Value Ref Range Status   Specimen Description   Final    FOOT RIGHT SOLE Performed at [facility], [address] [place], [place] [address]    Special Requests   Final    Normal Performed at [facility] [place] [place], [place] [address]    Gram Stain   Final    RARE WBC PRESENT, PREDOMINANTLY PMN FEW GRAM POSITIVE COCCI IN PAIRS IN CLUSTERS Performed at [facility], [place] [address] [place], [place] [address]    Culture MODERATE STAPHYLOCOCCUS AUREUS  Final   Report Status 10/07/2020 FINAL  Final   Organism ID, Bacteria STAPHYLOCOCCUS AUREUS  Final      Susceptibility   Staphylococcus aureus - MIC*    CIPROFLOXACIN <=0.5 SENSITIVE Sensitive     ERYTHROMYCIN <=0.25 SENSITIVE Sensitive     GENTAMICIN <=0.5 SENSITIVE Sensitive     OXACILLIN <=0.25 SENSITIVE Sensitive     TETRACYCLINE <=1 SENSITIVE Sensitive     VANCOMYCIN 1 SENSITIVE Sensitive     TRIMETH/SULFA <=10 SENSITIVE Sensitive     CLINDAMYCIN <=0.25 SENSITIVE Sensitive     RIFAMPIN <=0.5 SENSITIVE Sensitive     Inducible Clindamycin NEGATIVE Sensitive     * MODERATE STAPHYLOCOCCUS AUREUS  Culture, blood (routine x 2)     Status: None   Collection Time: 10/05/20  2:44 PM   Specimen: BLOOD  Result Value Ref Range Status   Specimen  Description BLOOD BLOOD LEFT FOREARM  Final   Special Requests   Final    BOTTLES DRAWN AEROBIC AND ANAEROBIC Blood Culture results may not be optimal due to an inadequate volume of blood received in culture bottles   Culture   Final    NO GROWTH 5 DAYS Performed at [facility], [address] [place], [place] [address]    Report Status 10/10/2020 FINAL  Final  Culture, blood (routine x 2)     Status: None   Collection Time: 10/05/20  2:44 PM   Specimen: BLOOD  Result Value Ref Range Status   Specimen Description BLOOD BLOOD RIGHT FOREARM  Final   Special Requests   Final    BOTTLES DRAWN AEROBIC AND ANAEROBIC Blood Culture adequate volume   Culture   Final    NO GROWTH 5 DAYS Performed at [facility], [address] [place], [place] [address]    Report Status 10/10/2020 FINAL  Final  Respiratory Panel by RT PCR (Flu A&B, Covid) - Nasopharyngeal Swab     Status: None   Collection Time: 10/05/20  2:44 PM   Specimen: Nasopharyngeal Swab  Result Value Ref  Range Status   SARS Coronavirus 2 by RT PCR NEGATIVE NEGATIVE Final    Comment: (NOTE) SARS-CoV-2 target nucleic acids are NOT DETECTED.  The SARS-CoV-2 RNA is generally detectable in upper respiratoy specimens during the acute phase of infection. The lowest concentration of SARS-CoV-2 viral copies this assay can detect is 131 copies/mL. A negative result does not preclude SARS-Cov-2 infection and should not be used as the sole basis for treatment or other patient management decisions. A negative result may occur with  improper specimen collection/handling, submission of specimen other than nasopharyngeal swab, presence of viral mutation(s) within the areas targeted by this assay, and inadequate number of viral copies (<131 copies/mL). A negative result must be combined with clinical observations, patient history, and epidemiological information. The expected result is Negative.  Fact Sheet for Patients:   PinkCheek.be  Fact Sheet for Healthcare Providers:  GravelBags.it  This test is no t yet approved or cleared by the [place] FDA and  has been authorized for detection and/or diagnosis of SARS-CoV-2 by FDA under an Emergency Use Authorization (EUA). This EUA will remain  in effect (meaning this test can be used) for the duration of the COVID-19 declaration under Section 564(b)(1) of the Act, 21 U.S.C. section 360bbb-3(b)(1), unless the authorization is terminated or revoked sooner.     Influenza A by PCR NEGATIVE NEGATIVE Final   Influenza B by PCR NEGATIVE NEGATIVE Final    Comment: (NOTE) The Xpert Xpress SARS-CoV-2/FLU/RSV assay is intended as an aid in  the diagnosis of influenza from Nasopharyngeal swab specimens and  should not be used as a sole basis for treatment. Nasal washings and  aspirates are unacceptable for Xpert Xpress SARS-CoV-2/FLU/RSV  testing.  Fact Sheet for Patients: PinkCheek.be  Fact Sheet for Healthcare Providers: GravelBags.it  This test is not yet approved or cleared by the [place] FDA and  has been authorized for detection and/or diagnosis of SARS-CoV-2 by  FDA under an Emergency Use Authorization (EUA). This EUA will remain  in effect (meaning this test can be used) for the duration of the  Covid-19 declaration under Section 564(b)(1) of the Act, 21  U.S.C. section 360bbb-3(b)(1), unless the authorization is  terminated or revoked. Performed at [facility], [address] [place], [place] [address]   Aerobic/Anaerobic Culture (surgical/deep wound)     Status: None (Preliminary result)   Collection Time: 10/05/20  6:56 PM   Specimen: Wound  Result Value Ref Range Status   Specimen Description   Final    WOUND Performed at [facility], [address] [place], [place] [address]    Special  Requests   Final    NONE Performed at [facility], [address] [place], [place] [address]    Gram Stain   Final    NO WBC SEEN MODERATE GRAM POSITIVE COCCI Performed at [facility], [place] [address], [place], [place] [address]    Culture   Final    ABUNDANT STAPHYLOCOCCUS AUREUS NO ANAEROBES ISOLATED; CULTURE IN PROGRESS FOR 5 DAYS    Report Status PENDING  Incomplete   Organism ID, Bacteria STAPHYLOCOCCUS AUREUS  Final      Susceptibility   Staphylococcus aureus - MIC*    CIPROFLOXACIN <=0.5 SENSITIVE Sensitive     ERYTHROMYCIN <=0.25 SENSITIVE Sensitive     GENTAMICIN <=0.5 SENSITIVE Sensitive     OXACILLIN <=0.25 SENSITIVE Sensitive     TETRACYCLINE <=1 SENSITIVE Sensitive     VANCOMYCIN 1 SENSITIVE  Sensitive     TRIMETH/SULFA <=10 SENSITIVE Sensitive     CLINDAMYCIN <=0.25 SENSITIVE Sensitive     RIFAMPIN <=0.5 SENSITIVE Sensitive     Inducible Clindamycin NEGATIVE Sensitive     * ABUNDANT STAPHYLOCOCCUS AUREUS  Aerobic/Anaerobic Culture (surgical/deep wound)     Status: None (Preliminary result)   Collection Time: 10/07/20  5:06 PM   Specimen: PATH Digit amputation; Tissue  Result Value Ref Range Status   Specimen Description TISSUE  Final   Special Requests 5TH METATARSAL  Final   Gram Stain NO WBC SEEN NO ORGANISMS SEEN   Final   Culture   Final    RARE STAPHYLOCOCCUS AUREUS SUSCEPTIBILITIES TO FOLLOW CRITICAL RESULT CALLED TO, READ BACK BY AND VERIFIED WITH: RN Joycelyn Das [phone] FCP Performed at [facility], [place] [address], [place], [place] [address]    Report Status PENDING  Incomplete     Labs:   Basic Metabolic Panel: Recent Labs  Lab 10/05/20 1444 10/05/20 1444 10/06/20 0440 10/07/20 0423 10/08/20 0507 10/09/20 0412 10/10/20 0426  NA 131*   < > 135 135 133* 137 136  K 4.0   < > 3.7 3.8 4.3 3.8 4.1  CL 95*   < > 98 98 99 105 100  CO2 24   < > '27 29 24 29 29  ' GLUCOSE 324*   < > 170* 199* 355* 106* 169*  BUN 32*    < > '17 14 19 ' 23* 20  CREATININE 1.06   < > 0.68 0.64 0.77 0.74 0.70  CALCIUM 8.3*   < > 8.3* 8.4* 7.9* 8.4* 8.7*  MG 2.0  --  1.8  --   --   --   --    < > = values in this interval not displayed.   Liver Function Tests: Recent Labs  Lab 10/05/20 1444 10/06/20 0440  AST 25 19  ALT 21 17  ALKPHOS 57 52  BILITOT 1.2 1.0  PROT 7.3 6.8  ALBUMIN 3.5 3.1*   CBC: Recent Labs  Lab 10/05/20 1444 10/05/20 1444 10/06/20 0440 10/07/20 0423 10/08/20 0507 10/09/20 0412 10/10/20 0426  WBC 15.3*   < > 12.6* 11.9* 13.8* 11.5* 10.7*  NEUTROABS 13.3*  --  9.9*  --   --   --   --   HGB 12.4*   < > 11.8* 11.3* 11.8* 11.5* 12.0*  HCT 35.3*   < > 33.2* 32.4* 33.5* 32.7* 34.6*  MCV 91.7   < > 92.0 91.5 91.3 92.1 91.8  PLT 212   < > 185 209 236 254 256   < > = values in this interval not displayed.    CBG: Recent Labs  Lab 10/09/20 0759 10/09/20 1139 10/09/20 1550 10/09/20 2109 10/10/20 0803  GLUCAP 113* 183* 250* 207* 164*     IMAGING STUDIES MR FOOT RIGHT WO CONTRAST  Result Date: 10/05/2020 CLINICAL DATA:  Focal soft tissue swelling and ulceration along the lateral fifth metatarsal phalangeal joint. EXAM: MRI OF THE RIGHT FOREFOOT WITHOUT CONTRAST TECHNIQUE: Multiplanar, multisequence MR imaging of the right was performed. No intravenous contrast was administered. COMPARISON:  None. FINDINGS: Bones/Joint/Cartilage Mildly increased T2 hyperintense signal with question of minimally decreased T1 hypointensity seen within the fifth metatarsal head. No other areas of cortical destruction or periosteal reaction. There is mildly increased T2 hyperintense signal seen also at the base of the fifth proximal phalanx. Joint space loss with subchondral cystic changes are seen at the fifth and fourth  metatarsal base cuboid articulation. Ligaments The Lisfranc ligaments are intact. Muscles and Tendons There is increased feathery signal seen throughout the muscles of the forefoot with mild atrophy.  The flexor and extensor tendons are intact. Soft tissues There is a focal area of ulceration seen on the plantar surface of the fifth metatarsal head measuring approximately 5 mm in transverse dimension. There is a fluid-filled sinus tract seen at this level. Non loculated fluid is seen surrounding the base of the fifth metatarsal head. There is diffuse overlying skin thickening and subcutaneous edema. There is also a multilocular small fluid collection seen along the lateral aspect of the fifth metatarsal head measuring approximately 1.5 x 0.8 cm. Diffuse overlying dorsal subcutaneous edema and skin thickening is seen. IMPRESSION: Area of ulceration seen on the lateral plantar base of the fifth metatarsal head with a sinus tract and probable small soft tissue abscess measuring 1.5 x 0.8 cm. Findings which could be suggestive of early osteomyelitis of the fifth metatarsal head. Probable reactive marrow at the fifth proximal phalanx. Electronically Signed   By: Prudencio Pair M.D.   On: 10/05/2020 21:07   DG Foot Complete Right  Result Date: 10/07/2020 CLINICAL DATA:  Postoperative radiograph following amputation of the fifth ray EXAM: RIGHT FOOT COMPLETE - 3+ VIEW COMPARISON:  Radiograph 10/06/2019, MRI 10/05/2020 FINDINGS: There are postsurgical changes from amputation of the fifth ray at the level of the proximal fifth metatarsal diaphysis with overlying soft tissue changes and bandaging material. First ray is in notable extension with additional clawtoe deformities of the second through fourth rays similar to comparison. Slight angulation across the second D IP is similar to prior. No acute complication is evident. No new sites of ulceration or concerning acute osseous abnormality. Stable degenerative changes throughout the foot and imaged ankle. Bidirectional calcaneal spurs. IMPRESSION: Postsurgical changes from amputation of the fifth ray at the level of the proximal fifth metatarsal diaphysis. No acute  complication is evident. Electronically Signed   By: Lovena Le M.D.   On: 10/07/2020 18:37   DG Foot Complete Right  Result Date: 10/05/2020 CLINICAL DATA:  Right foot ulceration. EXAM: RIGHT FOOT COMPLETE - 3+ VIEW COMPARISON:  July 19, 2017. FINDINGS: There is no evidence of fracture or dislocation. There is no evidence of arthropathy. Moderate posterior calcaneal spurring is noted. No lytic destruction is seen to suggest osteomyelitis. Probable focal soft tissue swelling and ulceration is seen lateral to the fifth metatarsophalangeal joint. IMPRESSION: Probable focal soft tissue swelling and ulceration is seen lateral to the fifth metatarsophalangeal joint. No lytic destruction is seen to suggest osteomyelitis. Electronically Signed   By: Marijo Conception M.D.   On: 10/05/2020 10:15   DG MINI C-ARM IMAGE ONLY  Result Date: 10/07/2020 There is no interpretation for this exam.  This order is for images obtained during a surgical procedure.  Please See "Surgeries" Tab for more information regarding the procedure.    DISCHARGE EXAMINATION: Vitals:   10/09/20 2027 10/10/20 0024 10/10/20 0457 10/10/20 0805  BP: (!) 151/79 138/81 (!) 149/88 (!) 159/81  Pulse: 71 74 76 74  Resp: '20 18 16 20  ' Temp: 97.7 F (36.5 C) 98 F (36.7 C) 98.3 F (36.8 C) (!) 97.5 F (36.4 C)  TempSrc: Oral Oral Oral Oral  SpO2: 96% 98% 96% 96%  Weight: 107.7 kg     Height:       General appearance: Awake alert.  In no distress Resp: Clear to auscultation bilaterally.  Normal effort  Cardio: S1-S2 is normal regular.  No S3-S4.  No rubs murmurs or bruit GI: Abdomen is soft.  Nontender nondistended.  Bowel sounds are present normal.  No masses organomegaly    DISPOSITION: Home  Discharge Instructions    Call MD for:  difficulty breathing, headache or visual disturbances   Complete by: As directed    Call MD for:  extreme fatigue   Complete by: As directed    Call MD for:  persistant dizziness or  light-headedness   Complete by: As directed    Call MD for:  persistant nausea and vomiting   Complete by: As directed    Call MD for:  redness, tenderness, or signs of infection (pain, swelling, redness, odor or green/yellow discharge around incision site)   Complete by: As directed    Call MD for:  severe uncontrolled pain   Complete by: As directed    Call MD for:  temperature >100.4   Complete by: As directed    Diet Carb Modified   Complete by: As directed    Discharge instructions   Complete by: As directed    Please be sure to follow-up with Dr. Luana Shu as per his recommendation.  Seek attention if your pain worsens or if you develop fever or chills.   You were cared for by a hospitalist during your hospital stay. If you have any questions about your discharge medications or the care you received while you were in the hospital after you are discharged, you can call the unit and asked to speak with the hospitalist on call if the hospitalist that took care of you is not available. Once you are discharged, your primary care physician will handle any further medical issues. Please note that NO REFILLS for any discharge medications will be authorized once you are discharged, as it is imperative that you return to your primary care physician (or establish a relationship with a primary care physician if you do not have one) for your aftercare needs so that they can reassess your need for medications and monitor your lab values. If you do not have a primary care physician, you can call [phone] for a physician referral.   Increase activity slowly   Complete by: As directed    No wound care   Complete by: As directed        Allergies as of 10/10/2020      Reactions   Amoxicillin Rash      Medication List    TAKE these medications   Accu-Chek FastClix Lancets Misc USE LANCET(S) TO CHECK GLUCOSE UP TO 4 TIMES DAILY   Accu-Chek Guide test strip Generic drug: glucose blood USE UP TO 4  TIMES DAILY AS DIRECTED   aspirin EC 81 MG tablet Take 81 mg by mouth daily.   atorvastatin 20 MG tablet Commonly known as: LIPITOR Take 1 tablet (20 mg total) by mouth every other day.   blood glucose meter kit and supplies Dispense based on patient and insurance preference. Use up to four times daily as directed. (FOR ICD-10 E10.9, E11.9).   cephALEXin 500 MG capsule Commonly known as: KEFLEX Take 1 capsule (500 mg total) by mouth every 6 (six) hours for 14 days.   Cinnamon 500 MG capsule Take 500 mg by mouth daily.   Garlic 2426 MG Caps Take 1,000 mg by mouth daily.   glipiZIDE 5 MG tablet Commonly known as: GLUCOTROL Take 1 tablet (5 mg total) by mouth 2 (two) times daily before a  meal. What changed:   when to take this  additional instructions   ibuprofen 400 MG tablet Commonly known as: ADVIL Take 1 tablet (400 mg total) by mouth every 6 (six) hours as needed for moderate pain.   metFORMIN 1000 MG tablet Commonly known as: GLUCOPHAGE Take 1 tablet (1,000 mg total) by mouth 2 (two) times daily with a meal. What changed: additional instructions   oxyCODONE-acetaminophen 5-325 MG tablet Commonly known as: PERCOCET/ROXICET Take 1 tablet by mouth every 8 (eight) hours as needed for severe pain.   Pen Needles 32G X 6 MM Misc 1 Units by Does not apply route daily as needed.   saccharomyces boulardii 250 MG capsule Commonly known as: Florastor Take 1 capsule (250 mg total) by mouth 2 (two) times daily for 14 days.   Tyler Aas FlexTouch 100 UNIT/ML FlexTouch Pen Generic drug: insulin degludec Inject 0.18 mLs (18 Units total) into the skin daily.   VITAMIN C CR 1500 MG Tbcr Take 1,500 mg by mouth daily.            Durable Medical Equipment  (From admission, onward)         Start     Ordered   10/10/20 1028  For home use only DME Walker rolling  Once       Question Answer Comment  Walker: With Clancy Wheels   Patient needs a walker to treat with the  following condition Wound cellulitis      10/10/20 1029   10/09/20 1047  For home use only DME Walker rolling  Once       Question Answer Comment  Walker: With [place]   Patient needs a walker to treat with the following condition Physical deconditioning      10/09/20 1047            Follow-up Information    Caroline More, DPM. Schedule an appointment as soon as possible for a visit on 10/11/2020.   Specialty: Podiatry Why: Appointment schedualed for November 2,2021 at 3:30pm at [address] [place], [place], [address] Contact information: [place] [address] [phone]               TOTAL DISCHARGE TIME: 67 minutes  Bonnielee Haff  [facility] Pager on [email or web address]  10/10/2020, 12:38 PM

## 2020-10-10 NOTE — Progress Notes (Signed)
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      PT Comments    Pt in and out of bed with ease.  Transfers with supervision.  To rehab gym for stair training.  Verbal cues for WB status with stairs but able to navigate with min guard.  He does bear some weight through heel but overall does well.    Continues 40' in gym with NWB hopping and min guard.  Discussed discharge plan.  He has a friend bringing a walker.  Pt has not seen walker or know what type it is.  Encouraged to get a new walker unless he can confirm its type and condition.  Does not want a commode.  Also stated he is considering using crutches and a knee walker.  Discouraged their use at this time.  Voiced understanding.  Also reviewed WB status and need to maintain for proper healing.  He is declining wheelchair stating he does not need it.     Follow Up Recommendations  Home health PT     Equipment Recommendations  Rolling walker with 5" wheels    Recommendations for Other Services       Precautions / Restrictions Precautions Precautions: Other (comment) Required Braces or Orthoses: Other Brace Other Brace: post op shoe Restrictions Weight Bearing Restrictions: Yes RLE Weight Bearing: Non weight bearing    Mobility  Bed Mobility Overal bed mobility: Independent                Transfers Overall transfer level: Modified independent Equipment used: Rolling walker (2 wheeled)                Ambulation/Gait Ambulation/Gait assistance: Modified independent (Device/Increase time) Gait Distance (Feet): 40 Feet Assistive device: Rolling walker (2 wheeled)   Gait velocity: decreased   General Gait Details: hopping   Stairs Stairs:  Yes Stairs assistance: Min guard Stair Management: Two rails;Step to pattern Number of Stairs: 4 General stair comments: does weight bear minimally through heel for stairs education provided   Wheelchair Mobility    Modified Rankin (Stroke Patients Only)       Balance Overall balance assessment: Modified Independent                                          Cognition Arousal/Alertness: Awake/alert Behavior During Therapy: WFL for tasks assessed/performed Overall Cognitive Status: Within Functional Limits for tasks assessed                                        Exercises      General Comments        Pertinent Vitals/Pain Pain Assessment: No/denies pain    Home Living                      Prior Function            PT Goals (current goals can now be found in the care plan section) Progress towards PT goals: Progressing toward goals    Frequency    Min 2X/week  PT Plan Current plan remains appropriate    Co-evaluation              AM-PAC PT "6 Clicks" Mobility   Outcome Measure  Help needed turning from your back to your side while in a flat bed without using bedrails?: None Help needed moving from lying on your back to sitting on the side of a flat bed without using bedrails?: None Help needed moving to and from a bed to a chair (including a wheelchair)?: None Help needed standing up from a chair using your arms (e.g., wheelchair or bedside chair)?: A Little Help needed to walk in hospital room?: A Little Help needed climbing 3-5 steps with a railing? : A Little 6 Click Score: 21    End of Session Equipment Utilized During Treatment: Gait belt Activity Tolerance: Patient tolerated treatment well Patient left: in bed;with bed alarm set;with call bell/phone within reach Nurse Communication: Mobility status       Time: 8850-2774 PT Time Calculation (min) (ACUTE ONLY): 15 min  Charges:  $Gait  Training: 8-22 mins                    Danielle Dess, PTA 10/10/20, 10:26 AM

## 2020-10-10 NOTE — Progress Notes (Signed)
Patient discharged to home with his brother.  Escorted out via wheelchair by a volunteer.  Patient given AVS and states understanding of instructions.

## 2020-10-10 NOTE — TOC Transition Note (Signed)
Transition of Care [facility]) - CM/SW Discharge Note   Patient Details  Name: [name] MRN: [id number] Date of Birth: 05-14-1961  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  Patient is medically cleared for discharge home today.  PT has recommended home health services but patient does not want home health arranged.  Patient has been managing wound care on his right foot for 9 months now, he knows that he needs to stay off the foot as much as possible.  Rolling walker will be delivered to the patient's room before discharge, provided by [employer].  Patient reports that his brother will be coming to pick him up this afternoon.  Patient will be following up with Dr. Excell Seltzer tomorrow, appointment scheduled for 3:30 pm.  Patient will also be contacting [facility] to get established with a provider there for PCP services.  Patient reports that he was going to [place] before but his provider moved.     Final next level of care: Home/Self Care Barriers to Discharge: Barriers Resolved   Patient Goals and CMS Choice Patient states their goals for this hospitalization and ongoing recovery are:: Glad to be going home      Discharge Placement                       Discharge Plan and Services                DME Arranged: Walker rolling DME Agency: [employer] Date DME Agency Contacted: 10/10/20 Time DME Agency Contacted: 1041 Representative spoke with at DME Agency: [facility] Arranged: Patient Refused HH          Social Determinants of Health (SDOH) Interventions     Readmission Risk Interventions No flowsheet data found.

## 2020-10-10 NOTE — Progress Notes (Signed)
PODIATRY / FOOT AND ANKLE SURGERY PROGRESS NOTE  Reason for consult: Right foot infection   HPI: [name] is a 59 y.o. male who presents status post 3 days right partial fifth ray amputation with rotational skin flap closure. Patient rates his pain today at 3/10 and states it is relatively well controlled overall to the right foot. Patient states overall that he is feeling pretty well and the pain is subsiding and has gotten better since his admission. He notes no nausea, vomiting, fever, chills since procedure. Patient is kept his dressings clean, dry, and intact and his stay off of his right foot is much as possible.   PMHx:  Past Medical History:  Diagnosis Date  . Arthritis    hands  . COVID-19 12/29/2019  . Diabetes mellitus without complication ([place])    type 2  . GERD (gastroesophageal reflux disease)   . Hypercholesterolemia   . Osteomyelitis of left foot [facility])     Surgical Hx:  Past Surgical History:  Procedure Laterality Date  . ADENOIDECTOMY    . AMPUTATION Right 10/07/2020   Procedure: AMPUTATION RAY-Right Fifth Ray;  Surgeon: Caroline More, DPM;  Location: [facility];  Service: Podiatry;  Laterality: Right;  . CATARACT EXTRACTION W/PHACO Right 03/03/2020   Procedure: CATARACT EXTRACTION PHACO AND INTRAOCULAR LENS PLACEMENT ([place]) RIGHT DIABETIC VISION BLUE;  Surgeon: Marchia Meiers, MD;  Location: [place];  Service: Ophthalmology;  Laterality: Right;  COVID ( + ) 12-29-2019  30.97 02:27.7  . CATARACT EXTRACTION W/PHACO Left 06/23/2020   Procedure: CATARACT EXTRACTION PHACO AND INTRAOCULAR LENS PLACEMENT ([facility]) LEFT DIABETIC VISION BLUE 5.14  00:34.7;  Surgeon: Marchia Meiers, MD;  Location: [place];  Service: Ophthalmology;  Laterality: Left;  DIABETIC  . ELBOW FRACTURE SURGERY Right   . EXTERNAL EAR SURGERY    . IRRIGATION AND DEBRIDEMENT FOOT Right 10/07/2020   Procedure: IRRIGATION AND DEBRIDEMENT FOOT;  Surgeon: Caroline More, DPM;  Location:  [facility] OR[facility];  Service: Podiatry;  Laterality: Right;    FHx:  Family History  Problem Relation Age of Onset  . Dementia Mother   . Hypertension Mother   . Diabetes Maternal Grandmother   . Hypertension Maternal Grandmother   . Cancer Neg Hx   . COPD Neg Hx   . Heart disease Neg Hx   . Stroke Neg Hx     Social History:  reports that he has never smoked. He has never used smokeless tobacco. He reports previous alcohol use. He reports that he does not use drugs.  Allergies:  Allergies  Allergen Reactions  . Amoxicillin Rash    Review of Systems: General ROS: negative Respiratory ROS: no cough, shortness of breath, or wheezing Cardiovascular ROS: no chest pain or dyspnea on exertion Gastrointestinal ROS: no abdominal pain, change in bowel habits, or black or bloody stools Musculoskeletal ROS: positive for - joint pain, joint stiffness and joint swelling Neurological ROS: positive for - numbness/tingling Dermatological ROS: positive for R foot incision 5th ray partial amputation  Medications Prior to Admission  Medication Sig Dispense Refill  . Ascorbic Acid (VITAMIN C CR) 1500 MG TBCR Take 1,500 mg by mouth daily.    Marland Kitchen aspirin EC 81 MG tablet Take 81 mg by mouth daily.    Marland Kitchen atorvastatin (LIPITOR) 20 MG tablet Take 1 tablet (20 mg total) by mouth every other day. 90 tablet 1  . Cinnamon 500 MG capsule Take 500 mg by mouth daily.    . Garlic 2876 MG CAPS Take  1,000 mg by mouth daily.    Marland Kitchen glipiZIDE (GLUCOTROL) 5 MG tablet Take 1 tablet (5 mg total) by mouth 2 (two) times daily before a meal. (Patient taking differently: Take 5 mg by mouth 2 (two) times daily. Breakfast and dinner) 180 tablet 1  . insulin degludec (TRESIBA FLEXTOUCH) 100 UNIT/ML FlexTouch Pen Inject 0.18 mLs (18 Units total) into the skin daily. 15 mL 2  . metFORMIN (GLUCOPHAGE) 1000 MG tablet Take 1 tablet (1,000 mg total) by mouth 2 (two) times daily with a meal. (Patient taking differently: Take 1,000 mg by mouth 2  (two) times daily with a meal. Breakfast and dinner) 180 tablet 1  . Accu-Chek FastClix Lancets MISC USE LANCET(S) TO CHECK GLUCOSE UP TO 4 TIMES DAILY    . ACCU-CHEK GUIDE test strip USE UP TO 4 TIMES DAILY AS DIRECTED    . blood glucose meter kit and supplies Dispense based on patient and insurance preference. Use up to four times daily as directed. (FOR ICD-10 E10.9, E11.9). 1 each 0  . Insulin Pen Needle (PEN NEEDLES) 32G X 6 MM MISC 1 Units by Does not apply route daily as needed. 100 each 11    Physical Exam: General: Alert and oriented.  No apparent distress.  Vascular: DP/PT pulses palpable bilateral. Capillary fill time appears to be intact to digits and to flap right foot. No hair growth noted to digits. Mild erythema noted to the dorsal lateral foot and fifth ray right amputation site along with mild swelling, appears to be improved overall since procedure and since admission.  Neuro: Light touch sensation reduced to digits bilateral.  Derm: Status post right partial fifth ray amputation with rotational skin flap closure. Skin edges appear to be well coapted with sutures intact. Capillary fill time appears to be intact to rotational flap. Still mild erythema and edema noted to the area of the procedure site but appears to be improving gradually. Minimal serous drainage noted today. No purulence expressed.     MSK: Right foot pain on palpation in the area of the procedure site. Status post right partial fifth ray amputation  Results for orders placed or performed during the hospital encounter of 10/05/20 (from the past 48 hour(s))  Glucose, capillary     Status: Abnormal   Collection Time: 10/08/20 12:29 PM  Result Value Ref Range   Glucose-Capillary 313 (H) 70 - 99 mg/dL    Comment: Glucose reference range applies only to samples taken after fasting for at least 8 hours.  Glucose, capillary     Status: Abnormal   Collection Time: 10/08/20  4:23 PM  Result Value Ref Range    Glucose-Capillary 410 (H) 70 - 99 mg/dL    Comment: Glucose reference range applies only to samples taken after fasting for at least 8 hours.   Comment 1 INSULIN GIVEN    Comment 2 Document in Chart   Glucose, capillary     Status: Abnormal   Collection Time: 10/08/20  9:14 PM  Result Value Ref Range   Glucose-Capillary 236 (H) 70 - 99 mg/dL    Comment: Glucose reference range applies only to samples taken after fasting for at least 8 hours.  CBC     Status: Abnormal   Collection Time: 10/09/20  4:12 AM  Result Value Ref Range   WBC 11.5 (H) 4.0 - 10.5 K/uL   RBC 3.55 (L) 4.22 - 5.81 MIL/uL   Hemoglobin 11.5 (L) 13.0 - 17.0 g/dL   HCT 32.7 (L)  39 - 52 %   MCV 92.1 80.0 - 100.0 fL   MCH 32.4 26.0 - 34.0 pg   MCHC 35.2 30.0 - 36.0 g/dL   RDW 11.6 11.5 - 15.5 %   Platelets 254 150 - 400 K/uL   nRBC 0.0 0.0 - 0.2 %    Comment: Performed at [facility], [address] [place], [place] [address]  Basic metabolic panel     Status: Abnormal   Collection Time: 10/09/20  4:12 AM  Result Value Ref Range   Sodium 137 135 - 145 mmol/L   Potassium 3.8 3.5 - 5.1 mmol/L   Chloride 105 98 - 111 mmol/L   CO2 29 22 - 32 mmol/L   Glucose, Bld 106 (H) 70 - 99 mg/dL    Comment: Glucose reference range applies only to samples taken after fasting for at least 8 hours.   BUN 23 (H) 6 - 20 mg/dL   Creatinine, Ser 0.74 0.61 - 1.24 mg/dL   Calcium 8.4 (L) 8.9 - 10.3 mg/dL   GFR, Estimated >60 >60 mL/min    Comment: (NOTE) Calculated using the CKD-EPI Creatinine Equation (2021)    Anion gap 3 (L) 5 - 15    Comment: Performed at [facility], [address] [place], [place] [address]  Glucose, capillary     Status: Abnormal   Collection Time: 10/09/20  7:59 AM  Result Value Ref Range   Glucose-Capillary 113 (H) 70 - 99 mg/dL    Comment: Glucose reference range applies only to samples taken after fasting for at least 8 hours.  Glucose, capillary     Status: Abnormal    Collection Time: 10/09/20 11:39 AM  Result Value Ref Range   Glucose-Capillary 183 (H) 70 - 99 mg/dL    Comment: Glucose reference range applies only to samples taken after fasting for at least 8 hours.  Glucose, capillary     Status: Abnormal   Collection Time: 10/09/20  3:50 PM  Result Value Ref Range   Glucose-Capillary 250 (H) 70 - 99 mg/dL    Comment: Glucose reference range applies only to samples taken after fasting for at least 8 hours.  Glucose, capillary     Status: Abnormal   Collection Time: 10/09/20  9:09 PM  Result Value Ref Range   Glucose-Capillary 207 (H) 70 - 99 mg/dL    Comment: Glucose reference range applies only to samples taken after fasting for at least 8 hours.  CBC     Status: Abnormal   Collection Time: 10/10/20  4:26 AM  Result Value Ref Range   WBC 10.7 (H) 4.0 - 10.5 K/uL   RBC 3.77 (L) 4.22 - 5.81 MIL/uL   Hemoglobin 12.0 (L) 13.0 - 17.0 g/dL   HCT 34.6 (L) 39 - 52 %   MCV 91.8 80.0 - 100.0 fL   MCH 31.8 26.0 - 34.0 pg   MCHC 34.7 30.0 - 36.0 g/dL   RDW 11.6 11.5 - 15.5 %   Platelets 256 150 - 400 K/uL   nRBC 0.0 0.0 - 0.2 %    Comment: Performed at [facility], [address] [place], [place] [address]  Basic metabolic panel     Status: Abnormal   Collection Time: 10/10/20  4:26 AM  Result Value Ref Range   Sodium 136 135 - 145 mmol/L   Potassium 4.1 3.5 - 5.1 mmol/L   Chloride 100 98 - 111 mmol/L   CO2 29 22 - 32 mmol/L  Glucose, Bld 169 (H) 70 - 99 mg/dL    Comment: Glucose reference range applies only to samples taken after fasting for at least 8 hours.   BUN 20 6 - 20 mg/dL   Creatinine, Ser 0.70 0.61 - 1.24 mg/dL   Calcium 8.7 (L) 8.9 - 10.3 mg/dL   GFR, Estimated >60 >60 mL/min    Comment: (NOTE) Calculated using the CKD-EPI Creatinine Equation (2021)    Anion gap 7 5 - 15    Comment: Performed at [facility], [address] [place], [place] [address]  Glucose, capillary     Status: Abnormal   Collection  Time: 10/10/20  8:03 AM  Result Value Ref Range   Glucose-Capillary 164 (H) 70 - 99 mg/dL    Comment: Glucose reference range applies only to samples taken after fasting for at least 8 hours.   No results found.  Blood pressure (!) 159/81, pulse 74, temperature (!) 97.5 F (36.4 C), temperature source Oral, resp. rate 20, height 6' (1.829 m), weight 107.7 kg, SpO2 96 %.  Assessment 1. Right fifth metatarsal phalangeal joint osteomyelitis with associated cellulitis and abscess status post right partial fifth ray amputation on 10/07/2020 2. Diabetes type 2 polyneuropathy, uncontrolled  Plan -Patient seen and examined. -Dressing removed and right foot evaluated today. Appears to have skin edges well coapted with sutures intact. Iodoform packing gauze was removed today. Erythema and edema appear to be improving overall since admission. -Reapplied Betadine soaked gauze to the periincisional area followed by Xeroform, 4 x 4 gauze, ABD, Kerlix, Ace wrap. Patient tolerated well. Patient to keep his dressings clean, dry, and intact until his postop appointment. -Cultures growing MSSA. Appreciate medicine and infectious disease recommendations for antibiotic therapy. Recommend p.o. course for 10 days at discharge. Appreciate recommendations again. -Pathology report pending. -Still recommend for the most part patient be nonweightbearing at all times to the right lower extremity but he may use his heel for transfers and surgical shoe but needs to avoid all pressure for the most part on the right foot. -Appreciate physical therapy recommendations.  Podiatry team to sign off at this time. Patient may be discharged when medically stable per medicine. Patient should follow-up in clinic within 1 week of discharge date.  Caroline More, DPM 10/10/2020, 8:48 AM

## 2020-10-12 LAB — AEROBIC/ANAEROBIC CULTURE W GRAM STAIN (SURGICAL/DEEP WOUND): Gram Stain: NONE SEEN

## 2020-10-12 LAB — SURGICAL PATHOLOGY

## 2020-10-13 ENCOUNTER — Encounter: Payer: BC Managed Care – PPO | Attending: Physician Assistant | Admitting: Physician Assistant

## 2020-10-13 ENCOUNTER — Other Ambulatory Visit: Payer: Self-pay

## 2020-10-13 DIAGNOSIS — L97512 Non-pressure chronic ulcer of other part of right foot with fat layer exposed: Secondary | ICD-10-CM | POA: Diagnosis not present

## 2020-10-13 DIAGNOSIS — L97522 Non-pressure chronic ulcer of other part of left foot with fat layer exposed: Secondary | ICD-10-CM | POA: Diagnosis not present

## 2020-10-13 DIAGNOSIS — E1140 Type 2 diabetes mellitus with diabetic neuropathy, unspecified: Secondary | ICD-10-CM | POA: Insufficient documentation

## 2020-10-13 DIAGNOSIS — E11621 Type 2 diabetes mellitus with foot ulcer: Secondary | ICD-10-CM | POA: Diagnosis not present

## 2020-10-13 LAB — AEROBIC/ANAEROBIC CULTURE W GRAM STAIN (SURGICAL/DEEP WOUND): Gram Stain: NONE SEEN

## 2020-10-13 NOTE — Progress Notes (Addendum)
[name], Bryam ([id number]) Visit Report for 10/13/2020 Chief Complaint Document Details Patient Name: [name], [name] Date of Service: 10/13/2020 3:30 PM Medical Record Number: [id number] Patient Account Number: [id number] Date of Birth/Sex: 01/01/1961 (59 y.o. M) Treating RN: Huel CoventryWoody, Kim Primary Care Provider: PATIENT, NO Other Clinician: Referring Provider: Roosvelt MaserLane, Rachel Treating Provider/Extender: Rowan BlaseStone, Kobe Jansma Weeks in Treatment: 18 Information Obtained from: Patient Chief Complaint Left foot/toe ulcers Electronic Signature(s) Signed: 10/13/2020 4:32:25 PM By: Lenda KelpStone III, Nadra Hritz PA-C Entered By: Lenda KelpStone III, Jaise Moser on 10/13/2020 16:32:25 Lahey, Maxamillian ([id number]) -------------------------------------------------------------------------------- Debridement Details Patient Name: [name] LenzNORRIS, [name] Date of Service: 10/13/2020 3:30 PM Medical Record Number: [id number] Patient Account Number: [id number] Date of Birth/Sex: 12/30/1960 (59 y.o. M) Treating RN: Huel CoventryWoody, Kim Primary Care Provider: PATIENT, NO Other Clinician: Referring Provider: Roosvelt MaserLane, Rachel Treating Provider/Extender: Rowan BlaseStone, Megan Hayduk Weeks in Treatment: 18 Debridement Performed for Wound #2 Left,Anterior Toe Great Assessment: Performed By: Physician Nelida MeuseStone, Atha Muradyan E., PA-C Debridement Type: Debridement Severity of Tissue Pre Debridement: Fat layer exposed Level of Consciousness (Pre- Awake and Alert procedure): Pre-procedure Verification/Time Out Yes - 15:40 Taken: Total Area Debrided (L x W): 0.2 (cm) x 0.2 (cm) = 0.04 (cm) Tissue and other material Viable, Non-Viable, Callus, Slough, Subcutaneous, Slough debrided: Level: Skin/Subcutaneous Tissue Debridement Description: Excisional Instrument: Curette Bleeding: None Response to Treatment: Procedure was tolerated well Level of Consciousness (Post- Awake and Alert procedure): Post Debridement Measurements of Total Wound Length: (cm) 0.2 Width: (cm) 0.2 Depth: (cm) 0.3 Volume:  (cm) 0.009 Character of Wound/Ulcer Post Debridement: Stable Severity of Tissue Post Debridement: Fat layer exposed Post Procedure Diagnosis Same as Pre-procedure Electronic Signature(s) Signed: 10/13/2020 4:35:00 PM By: Lenda KelpStone III, Deyra Perdomo PA-C Signed: 10/17/2020 6:50:12 PM By: Elliot GurneyWoody, BSN, RN, CWS, Kim RN, BSN Entered By: Lenda KelpStone III, Amilcar Reever on 10/13/2020 16:34:59 Faught, Nyquan ([id number]) -------------------------------------------------------------------------------- HPI Details Patient Name: [name] LenzNORRIS, [name] Date of Service: 10/13/2020 3:30 PM Medical Record Number: [id number] Patient Account Number: [id number] Date of Birth/Sex: 08/04/1961 (59 y.o. M) Treating RN: Huel CoventryWoody, Kim Primary Care Provider: PATIENT, NO Other Clinician: Referring Provider: Roosvelt MaserLane, Rachel Treating Provider/Extender: Rowan BlaseStone, Martavis Gurney Weeks in Treatment: 18 History of Present Illness HPI Description: 06/10/2020 upon evaluation today patient appears to be doing poorly in regard to his right plantar foot, left great toe, and left second toe. Unfortunately he has significant wounds at these locations but especially in regard to the second toe which does have a lot of necrotic tissue over the distal portion of the toe. The great toe does have some necrotic tissue as well. With that being said the patient tells me that this has been going on for 1-2 months and seems to be getting worse especially in regard to the toe. He has not had any specific x-rays to identify obvious signs of osteomyelitis up to this point that something would likely get a different today as well. With that being said he also has not had any current arterial studies which I think is something else that we probably will look into performing at this point. He does have a history of diabetes mellitus type 2, and diabetic neuropathy. Currently he has been using normal shoes for ambulation though he does have some kind of external device so that he does not have to have  actual still toed boots anymore at work which he states has helped nonetheless we is at work he does have to have something that protects his feet as such per policy. He tells me he is having to do a lot more walking right now compared to  normal but he can try to see if he can change something in that regard. 06/16/2020 upon evaluation today patient appears to be doing well all things considered with regard to his wounds. I do not feel like anything is it any worse and overall I feel like the Bactrim has done well. The culture that I reviewed did not reveal any specific organisms unfortunately that would help tailor treatment. I did actually review the x-ray as well it does appear that he has evidence of osteomyelitis of the distal tuft of the second toe left foot. That was discussed with him today. He likely will need a referral to infectious disease. 06/30/2020 on evaluation today patient appears to be doing well at this time in regard to his wounds all things considered. The erythema has improved he does have evidence of osteomyelitis again the second toe on the left foot but at the other sites there were no obvious signs. He does have wounds bilaterally on his feet and toes. These are to require sharp debridement today. He is still pending as far as his appointment with infectious disease. With that being said this is actually scheduled for 29 July which is this month. He also has an appointment on the 27th with vascular. Overall at least we are getting to the point where I think we are getting something is taken care of here. 07/12/2020 patient did have his appointment with [facility] infectious disease. They have placed him on medications which include Flagyl 500 mg and Levaquin 750 mg for the next several weeks until September 9. Other than that he overall seems to be looking quite well and I am very pleased with how things are progressing at this point. 07/21/2020 on evaluation today patient appears to be  doing well with regard to his wounds. He is on Flagyl as well as Levaquin which is helping to take care of the infection it appears. Fortunately there is no signs of active infection systemically at this time which is also good news. I am very pleased with where things stand. 07/28/20 on evaluation today patient appears to be doing well with regard to his wounds. He has been tolerating the dressing changes without complication we've been using the alginate currently. With that being said I do feel like the patient is making good progress albeit slow. 08/04/2020 on evaluation today patient's wound actually is showing signs of good improvement which is great news and overall very pleased with where things stand. Especially in regard to the right foot. The left great toe unfortunately is still having a lot of drainage and is somewhat macerated. This actually does appear to go deeper and is actually showing signs of bone exposure in the base of the wound. Again he is on antibiotics and I think we could consider HBO therapy in fact this is something I may discuss with him next week depending on how the toe looks. With that being said in the short-term I think I am going to actually recommend that he change the dressing more frequently right has been doing every other day 08/16/2020 on evaluation today patient appears to be doing well with regard to his ulcers. He is going require some debridement of both sites remaining he still is having a lot of drainage from the toe but again this seems to be making some progress here just very slowly. I did discuss with him the possibility of hyperbarics today but he really was not interested in that at this point. 08/25/2020 upon  evaluation today patient's wounds appear to be doing okay at this point. He is progressing quite slowly at this time but nonetheless does seem to be making fairly good progress at this time which is good news. Fortunately there is no signs of  active infection at this time. He does have an appointment with his infectious disease doctor next week on Thursday therefore he will not be able to make the appointment here we will likely see him in 2 weeks in that regard. 09/09/2020 upon evaluation today patient appears to be doing well currently in regard to his wounds. He still has openings at both locations although the great toe on the left does appear to be a little smaller to me which is good news. Fortunately there is no signs of active infection at this time which is excellent. 09/30/2020 on evaluation today patient appears to be doing well with regard to his left great toe ulcer this is measuring a little smaller. In regard to the right foot plantar unfortunately this is not really significantly improved. I think the biggest issue here is he continues to wear his work shoes and walks sufficiently on this to because it did not really be able to close as effectively as it needs to. We previously discussed total contact casting but the patient states he cannot do this and work and he also can do this and drive which is essential to him. He tells me that he does not really have any time off and he is very concerned about the fact that he has to continue to work. Again this is a discussion we have had multiple times in the past as well. 10/13/2020 on evaluation today patient appears to be doing decently well in regard to his left great toe ulcer this is not as macerated mainly likely due to the fact that he is not working currently in his work boots for a large portion of his day. 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 He ended up going to urgent care subsequently to the hospital and has since had the amputation. I did not view that today as it is wrapped up and he recently just saw his podiatrist in that regard. Fortunately there is no signs of active  infection at this time which is great news. MAZZOCCO, Claris ([id number]) Electronic Signature(s) Signed: 10/13/2020 4:33:00 PM By: Lenda Kelp PA-C Entered By: Lenda Kelp on 10/13/2020 16:33:00 Rankin, Kayveon ([id number]) -------------------------------------------------------------------------------- Physical Exam Details Patient Name: [name] Date of Service: 10/13/2020 3:30 PM Medical Record Number: [id number] Patient Account Number: [id number] Date of Birth/Sex: [birth year] (59 y.o. M) Treating RN: Huel Coventry Primary Care Provider: PATIENT, NO Other Clinician: Referring Provider: Roosvelt Maser Treating Provider/Extender: Rowan Blase in Treatment: 18 Constitutional Well-nourished and well-hydrated in no acute distress. Respiratory normal breathing without difficulty. Psychiatric this patient is able to make decisions and demonstrates good insight into disease process. Alert and Oriented x 3. pleasant and cooperative. Notes Upon inspection patient's wound bed actually showed some signs of callus covering over the opening. With that being said he did still have a small opening currently underlying the callus. I did remove the callus and some of the slough from the surface of the wound he tolerated all this without complication today post debridement the wound bed appears to be doing better which is great news. There is no signs of active infection at this  time which is also great news. Electronic Signature(s) Signed: 10/13/2020 4:34:36 PM By: Lenda Kelp PA-C Entered By: Lenda Kelp on 10/13/2020 16:34:36 Mittman, Niam ([id number]) -------------------------------------------------------------------------------- Physician Orders Details Patient Name: [name] Date of Service: 10/13/2020 3:30 PM Medical Record Number: [id number] Patient Account Number: [id number] Date of Birth/Sex: 07/05/1961 (59 y.o. M) Treating RN: Huel Coventry Primary Care Provider:  PATIENT, NO Other Clinician: Referring Provider: Roosvelt Maser Treating Provider/Extender: Rowan Blase in Treatment: 18 Verbal / Phone Orders: No Diagnosis Coding ICD-10 Coding Code Description E11.621 Type 2 diabetes mellitus with foot ulcer 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.512 Non-pressure chronic ulcer of other part of right foot with fat layer exposed E11.40 Type 2 diabetes mellitus with diabetic neuropathy, unspecified Wound Cleansing Wound #2 Left,Anterior Toe Great o Clean wound with Normal Saline. Anesthetic (add to Medication List) Wound #2 Left,Anterior Toe Great o Topical Lidocaine 4% cream applied to wound bed prior to debridement (In Clinic Only). Primary Wound Dressing Wound #2 Left,Anterior Toe Great o Silver Collagen Secondary Dressing Wound #2 Left,Anterior Toe Great o Gauze and Kerlix/Conform Dressing Change Frequency Wound #2 Left,Anterior Toe Great o Change dressing every other day. Follow-up Appointments Wound #2 Left,Anterior Toe Great o Return Appointment in 1 week. Edema Control Wound #2 Left,Anterior Toe Great o Elevate legs to the level of the heart and pump ankles as often as possible Additional Orders / Instructions Wound #2 Left,Anterior Toe Great o Increase protein intake. Electronic Signature(s) Signed: 10/13/2020 5:01:49 PM By: Lenda Kelp PA-C Signed: 10/17/2020 6:50:12 PM By: Elliot Gurney, BSN, RN, CWS, Kim RN, BSN Entered By: Elliot Gurney, BSN, RN, CWS, Kim on 10/13/2020 15:45:24 Cancel, Zadin ([id number]) -------------------------------------------------------------------------------- Problem List Details Patient Name: [name] Date of Service: 10/13/2020 3:30 PM Medical Record Number: [id number] Patient Account Number: [id number] Date of Birth/Sex: October 16, 1961 (59 y.o. M) Treating RN: Huel Coventry Primary Care Provider: PATIENT, NO Other Clinician: Referring  Provider: Roosvelt Maser Treating Provider/Extender: Rowan Blase in Treatment: 18 Active Problems ICD-10 Encounter Code Description Active Date MDM Diagnosis E11.621 Type 2 diabetes mellitus with foot ulcer 06/07/2020 No Yes L97.522 Non-pressure chronic ulcer of other part of left foot with fat layer 06/07/2020 No Yes exposed L97.512 Non-pressure chronic ulcer of other part of right foot with fat layer 06/07/2020 No Yes exposed E11.40 Type 2 diabetes mellitus with diabetic neuropathy, unspecified 06/07/2020 No Yes Inactive Problems Resolved Problems Electronic Signature(s) Signed: 10/13/2020 3:37:13 PM By: Lenda Kelp PA-C Entered By: Lenda Kelp on 10/13/2020 15:37:13 Disbro, Seve ([id number]) -------------------------------------------------------------------------------- Progress Note Details Patient Name: [name] Date of Service: 10/13/2020 3:30 PM Medical Record Number: [id number] Patient Account Number: [id number] Date of Birth/Sex: 10/01/1961 (59 y.o. M) Treating RN: Huel Coventry Primary Care Provider: PATIENT, NO Other Clinician: Referring Provider: Roosvelt Maser Treating Provider/Extender: Rowan Blase in Treatment: 18 Subjective Chief Complaint Information obtained from Patient Left foot/toe ulcers History of Present Illness (HPI) 06/10/2020 upon evaluation today patient appears to be doing poorly in regard to his right plantar foot, left great toe, and left second toe. Unfortunately he has significant wounds at these locations but especially in regard to the second toe which does have a lot of necrotic tissue over the distal portion of the toe. The great toe does have some necrotic tissue as well. With that being said the patient tells me that this has been going on for 1-2 months and seems to be getting worse especially in regard  to the toe. He has not had any specific x-rays to identify obvious signs of osteomyelitis up to this point that something  would likely get a different today as well. With that being said he also has not had any current arterial studies which I think is something else that we probably will look into performing at this point. He does have a history of diabetes mellitus type 2, and diabetic neuropathy. Currently he has been using normal shoes for ambulation though he does have some kind of external device so that he does not have to have actual still toed boots anymore at work which he states has helped nonetheless we is at work he does have to have something that protects his feet as such per policy. He tells me he is having to do a lot more walking right now compared to normal but he can try to see if he can change something in that regard. 06/16/2020 upon evaluation today patient appears to be doing well all things considered with regard to his wounds. I do not feel like anything is it any worse and overall I feel like the Bactrim has done well. The culture that I reviewed did not reveal any specific organisms unfortunately that would help tailor treatment. I did actually review the x-ray as well it does appear that he has evidence of osteomyelitis of the distal tuft of the second toe left foot. That was discussed with him today. He likely will need a referral to infectious disease. 06/30/2020 on evaluation today patient appears to be doing well at this time in regard to his wounds all things considered. The erythema has improved he does have evidence of osteomyelitis again the second toe on the left foot but at the other sites there were no obvious signs. He does have wounds bilaterally on his feet and toes. These are to require sharp debridement today. He is still pending as far as his appointment with infectious disease. With that being said this is actually scheduled for 29 July which is this month. He also has an appointment on the 27th with vascular. Overall at least we are getting to the point where I think we are  getting something is taken care of here. 07/12/2020 patient did have his appointment with [facility] infectious disease. They have placed him on medications which include Flagyl 500 mg and Levaquin 750 mg for the next several weeks until September 9. Other than that he overall seems to be looking quite well and I am very pleased with how things are progressing at this point. 07/21/2020 on evaluation today patient appears to be doing well with regard to his wounds. He is on Flagyl as well as Levaquin which is helping to take care of the infection it appears. Fortunately there is no signs of active infection systemically at this time which is also good news. I am very pleased with where things stand. 07/28/20 on evaluation today patient appears to be doing well with regard to his wounds. He has been tolerating the dressing changes without complication we've been using the alginate currently. With that being said I do feel like the patient is making good progress albeit slow. 08/04/2020 on evaluation today patient's wound actually is showing signs of good improvement which is great news and overall very pleased with where things stand. Especially in regard to the right foot. The left great toe unfortunately is still having a lot of drainage and is somewhat macerated. This actually does appear to go deeper  and is actually showing signs of bone exposure in the base of the wound. Again he is on antibiotics and I think we could consider HBO therapy in fact this is something I may discuss with him next week depending on how the toe looks. With that being said in the short-term I think I am going to actually recommend that he change the dressing more frequently right has been doing every other day 08/16/2020 on evaluation today patient appears to be doing well with regard to his ulcers. He is going require some debridement of both sites remaining he still is having a lot of drainage from the toe but again this seems to be  making some progress here just very slowly. I did discuss with him the possibility of hyperbarics today but he really was not interested in that at this point. 08/25/2020 upon evaluation today patient's wounds appear to be doing okay at this point. He is progressing quite slowly at this time but nonetheless does seem to be making fairly good progress at this time which is good news. Fortunately there is no signs of active infection at this time. He does have an appointment with his infectious disease doctor next week on Thursday therefore he will not be able to make the appointment here we will likely see him in 2 weeks in that regard. 09/09/2020 upon evaluation today patient appears to be doing well currently in regard to his wounds. He still has openings at both locations although the great toe on the left does appear to be a little smaller to me which is good news. Fortunately there is no signs of active infection at this time which is excellent. 09/30/2020 on evaluation today patient appears to be doing well with regard to his left great toe ulcer this is measuring a little smaller. In regard to the right foot plantar unfortunately this is not really significantly improved. I think the biggest issue here is he continues to wear his work shoes and walks sufficiently on this to because it did not really be able to close as effectively as it needs to. We previously discussed total contact casting but the patient states he cannot do this and work and he also can do this and drive which is essential to him. He tells me that he does not really have any time off and he is very concerned about the fact that he has to continue to work. Again this is a discussion we have had multiple times in the past as well. CORRO, Leiland ([id number]) 10/13/2020 on evaluation today patient appears to be doing decently well in regard to his left great toe ulcer this is not as macerated mainly likely due to the fact that he  is not working currently in his work boots for a large portion of his day. 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 He ended up going to urgent care subsequently to the hospital and has since had the amputation. I did not view that today as it is wrapped up and he recently just saw his podiatrist in that regard. Fortunately there is no signs of active infection at this time which is great news. Objective Constitutional Well-nourished and well-hydrated in no acute distress. Vitals Time Taken: 3:32 PM, Height: 72 in, Weight: 249 lbs, BMI: 33.8, Temperature: 97.5 F, Pulse: 87 bpm, Respiratory Rate: 16 breaths/min, Blood Pressure: 133/78 mmHg. Respiratory normal breathing without difficulty.  Psychiatric this patient is able to make decisions and demonstrates good insight into disease process. Alert and Oriented x 3. pleasant and cooperative. General Notes: Upon inspection patient's wound bed actually showed some signs of callus covering over the opening. With that being said he did still have a small opening currently underlying the callus. I did remove the callus and some of the slough from the surface of the wound he tolerated all this without complication today post debridement the wound bed appears to be doing better which is great news. There is no signs of active infection at this time which is also great news. Integumentary (Hair, Skin) Wound #2 status is Open. Original cause of wound was Gradually Appeared. The wound is located on the [employer]. The wound measures 0.2cm length x 0.2cm width x 0.2cm depth; 0.031cm^2 area and 0.006cm^3 volume. There is Fat Layer (Subcutaneous Tissue) exposed. There is no tunneling or undermining noted. There is a medium amount of serous drainage noted. The wound margin is epibole. There is large (67-100%) granulation within the wound bed. There is no necrotic  tissue within the wound bed. General Notes: Callus removed Assessment Active Problems ICD-10 Type 2 diabetes mellitus with foot ulcer Non-pressure chronic ulcer of other part of left foot with fat layer exposed Non-pressure chronic ulcer of other part of right foot with fat layer exposed Type 2 diabetes mellitus with diabetic neuropathy, unspecified Procedures Wound #2 Pre-procedure diagnosis of Wound #2 is a Diabetic Wound/Ulcer of the Lower Extremity located on the Left,Anterior Toe Great .Severity of Tissue Pre Debridement is: Fat layer exposed. There was a Excisional Skin/Subcutaneous Tissue Debridement with a total area of 0.04 sq cm performed by Nelida Meuse., PA-C. With the following instrument(s): Curette to remove Viable and Non-Viable tissue/material. Material removed includes Callus, Subcutaneous Tissue, and Slough. No specimens were taken. A time out was conducted at 15:40, prior to the start of the procedure. There was no bleeding. The procedure was tolerated well. Post Debridement Measurements: 0.2cm length x 0.2cm width x 0.3cm depth; 0.009cm^3 volume. Character of Wound/Ulcer Post Debridement is stable. Severity of Tissue Post Debridement is: Fat layer exposed. Post procedure Diagnosis Wound #2: Same as Pre-Procedure Mcdonagh, Domingo ([id number]) Plan Wound Cleansing: Wound #2 Left,Anterior Toe Great: Clean wound with Normal Saline. Anesthetic (add to Medication List): Wound #2 Left,Anterior Toe Great: Topical Lidocaine 4% cream applied to wound bed prior to debridement (In Clinic Only). Primary Wound Dressing: Wound #2 Left,Anterior Toe Great: Silver Collagen Secondary Dressing: Wound #2 Left,Anterior Toe Great: Gauze and Kerlix/Conform Dressing Change Frequency: Wound #2 Left,Anterior Toe Great: Change dressing every other day. Follow-up Appointments: Wound #2 Left,Anterior Toe Great: Return Appointment in 1 week. Edema Control: Wound #2 Left,Anterior Toe  Great: Elevate legs to the level of the heart and pump ankles as often as possible Additional Orders / Instructions: Wound #2 Left,Anterior Toe Great: Increase protein intake. 1. I would recommend at this time that we actually go ahead and initiate treatment with a switch to silver collagen at this point for the left great toe I think this may do better especially while he is out of work I think this is a good point in time to try to get this healed. Hopefully will be able to do this and accomplish the goal in that regard. 2. I am also can recommend at this time the patient continue to monitor for any signs of worsening infection. Fortunately there is no evidence of significant infection on the left  great toe hopefully will remain that way if he has any concerns however he will let me know. 3. I am to recommend he avoid wearing shoes while he is at home so as not to put any pressure on the left great toe hopefully this will also allow this area to heal more effectively and quickly. We will see patient back for reevaluation in 1 week here in the clinic. If anything worsens or changes patient will contact our office for additional recommendations. Electronic Signature(s) Signed: 10/13/2020 4:36:01 PM By: Lenda Kelp PA-C Entered By: Lenda Kelp on 10/13/2020 16:36:00 Vore, Quanta ([id number]) -------------------------------------------------------------------------------- SuperBill Details Patient Name: [name] Date of Service: 10/13/2020 Medical Record Number: [id number] Patient Account Number: [id number] Date of Birth/Sex: 1961/09/14 (59 y.o. 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 E11.40 Type 2 diabetes mellitus with diabetic neuropathy, unspecified Facility Procedures CPT4 Code: 81157262 Description: 11042 - DEB SUBQ TISSUE 20 SQ CM/< Modifier: Quantity: 1 CPT4 Code: Description: ICD-10 Diagnosis Description L97.522 Non-pressure chronic ulcer of other part of left foot with fat layer exp Modifier: osed Quantity: Physician Procedures CPT4 Code: 0355974 Description: 11042 - WC PHYS SUBQ TISS 20 SQ CM Modifier: Quantity: 1 CPT4 Code: Description: ICD-10 Diagnosis Description L97.522 Non-pressure chronic ulcer of other part of left foot with fat layer exp Modifier: osed Quantity: Electronic Signature(s) Signed: 10/13/2020 4:36:25 PM By: Lenda Kelp PA-C Entered By: Lenda Kelp on 10/13/2020 16:36:24

## 2020-10-18 NOTE — Progress Notes (Signed)
SAAFIR, ABDULLAH ([id number]) Visit Report for 10/13/2020 Arrival Information Details Patient Name: [name], [name] Date of Service: 10/13/2020 3:30 PM Medical Record Number: [id number] Patient Account Number: [id number] Date of Birth/Sex: 08-14-1961 (59 y.o. M) Treating RN: Cornell Barman Primary Care Ashmi Blas: PATIENT, NO Other Clinician: Referring Celsey Asselin: Merrie Roof Treating Sheneika Walstad/Extender: Skipper Cliche in Treatment: 18 Visit Information History Since Last Visit Added or deleted any medications: Yes Patient Arrived: Ambulatory Has Dressing in Place as Prescribed: Yes Arrival Time: 15:31 Pain Present Now: No Accompanied By: self Transfer Assistance: None Patient Identification Verified: Yes Secondary Verification Process Completed: Yes Patient Requires Transmission-Based Precautions: No Patient Has Alerts: Yes Patient Alerts: Type II Diabetic Electronic Signature(s) Signed: 10/17/2020 6:50:12 PM By: Gretta Cool, BSN, RN, CWS, Kim RN, BSN Entered By: Gretta Cool, BSN, RN, CWS, Kim on 10/13/2020 15:32:09 Narez, Torre ([id number]) -------------------------------------------------------------------------------- [name] Date of Service: 10/13/2020 3:30 PM Medical Record Number: [id number] Patient Account Number: [id number] Date of Birth/Sex: [birth year] (59 y.o. M) Treating RN: Cornell Barman Primary Care Taneil Lazarus: PATIENT, NO Other Clinician: Referring Ebert Forrester: Merrie Roof Treating Narvel Kozub/Extender: Skipper Cliche in Treatment: 18 Encounter Discharge Information Items Post Procedure Vitals Discharge Condition: Stable Unable to obtain vitals Reason: . Ambulatory Status: Ambulatory Discharge Destination: Home Transportation: Other Accompanied By: self Schedule Follow-up Appointment: Yes Clinical Summary of Care: Electronic Signature(s) Signed: 10/13/2020 4:55:37 PM By: Gretta Cool, BSN, RN, CWS, Kim RN, BSN Entered By: Gretta Cool,  BSN, RN, CWS, Kim on 10/13/2020 16:55:37 Joos, Davontae ([id number]) -------------------------------------------------------------------------------- Lower Extremity Assessment Details Patient Name: [name] Date of Service: 10/13/2020 3:30 PM Medical Record Number: [id number] Patient Account Number: [id number] Date of Birth/Sex: 06-20-1961 (59 y.o. M) Treating RN: Cornell Barman Primary Care Ling Flesch: PATIENT, NO Other Clinician: Referring Dishawn Bhargava: Merrie Roof Treating Porfirio Bollier/Extender: Skipper Cliche in Treatment: 18 Edema Assessment Assessed: [Left: No] [Right: No] Edema: [Left: N] [Right: o] Vascular Assessment Pulses: Dorsalis Pedis Palpable: [Left:Yes] Posterior Tibial Palpable: [Left:Yes] Electronic Signature(s) Signed: 10/17/2020 6:50:12 PM By: Gretta Cool, BSN, RN, CWS, Kim RN, BSN Entered By: Gretta Cool, BSN, RN, CWS, Kim on 10/13/2020 15:36:16 Corman, Babe ([id number]) -------------------------------------------------------------------------------- Multi Wound Chart Details Patient Name: [name] Date of Service: 10/13/2020 3:30 PM Medical Record Number: [id number] Patient Account Number: [id number] Date of Birth/Sex: 19-Mar-1961 (59 y.o. M) Treating RN: Cornell Barman Primary Care Shmiel Morton: PATIENT, NO Other Clinician: Referring Keifer Habib: Merrie Roof Treating Logun Colavito/Extender: Skipper Cliche in Treatment: 18 Vital Signs Height(in): 72 Pulse(bpm): 53 Weight(lbs): 249 Blood Pressure(mmHg): 133/78 Body Mass Index(BMI): 34 Temperature(F): 97.5 Respiratory Rate(breaths/min): 16 Photos: [N/A:N/A] Wound Location: Left, Anterior Toe Great N/A N/A Wounding Event: Gradually Appeared N/A N/A Primary Etiology: Diabetic Wound/Ulcer of the Lower N/A N/A Extremity Comorbid History: Cataracts, Type II Diabetes, N/A N/A Neuropathy Date Acquired: 05/07/2020 N/A N/A Weeks of Treatment: 18 N/A N/A Wound Status: Open N/A N/A Measurements L x W x D (cm) 0.2x0.2x0.2 N/A  N/A Area (cm) : 0.031 N/A N/A Volume (cm) : 0.006 N/A N/A % Reduction in Area: 97.60% N/A N/A % Reduction in Volume: 97.70% N/A N/A Classification: Grade 1 N/A N/A Exudate Amount: Medium N/A N/A Exudate Type: Serous N/A N/A Exudate Color: amber N/A N/A Wound Margin: Epibole N/A N/A Granulation Amount: Large (67-100%) N/A N/A Necrotic Amount: None Present (0%) N/A N/A Exposed Structures: Fat Layer (Subcutaneous Tissue): N/A N/A Yes Fascia: No Tendon: No Muscle: No Joint: No Bone: No Epithelialization: Large (67-100%) N/A N/A Assessment Notes: Callus removed N/A N/A Treatment Notes Electronic Signature(s) Signed: 10/17/2020 6:50:12 PM By:  Gretta Cool, BSN, RN, CWS, Kim RN, BSN Entered By: Gretta Cool, BSN, RN, CWS, Kim on 10/13/2020 15:41:58 Rossman, Trenten ([id number]) -------------------------------------------------------------------------------- Multi-Disciplinary Care Plan Details Patient Name: [name] Date of Service: 10/13/2020 3:30 PM Medical Record Number: [id number] Patient Account Number: [id number] Date of Birth/Sex: [birth year] (59 y.o. M) Treating RN: Cornell Barman Primary Care Berit Raczkowski: PATIENT, NO Other Clinician: Referring Jonthan Leite: Merrie Roof Treating Partick Musselman/Extender: Skipper Cliche in Treatment: 18 Active Inactive Necrotic Tissue Nursing Diagnoses: Impaired tissue integrity related to necrotic/devitalized tissue Goals: Necrotic/devitalized tissue will be minimized in the wound bed Date Initiated: 06/07/2020 Target Resolution Date: 10/20/2020 Goal Status: Active Interventions: Assess patient pain level pre-, during and post procedure and prior to discharge Treatment Activities: Excisional debridement : 06/07/2020 Notes: Wound/Skin Impairment Nursing Diagnoses: Impaired tissue integrity Goals: Patient/caregiver will verbalize understanding of skin care regimen Date Initiated: 06/07/2020 Date Inactivated: 07/12/2020 Target Resolution Date:  07/06/2020 Goal Status: Met Ulcer/skin breakdown will have a volume reduction of 30% by week 4 Date Initiated: 06/07/2020 Date Inactivated: 07/12/2020 Target Resolution Date: 07/06/2020 Goal Status: Met Ulcer/skin breakdown will have a volume reduction of 50% by week 8 Date Initiated: 07/28/2020 Date Inactivated: 09/30/2020 Target Resolution Date: 08/06/2020 Goal Status: Unmet Unmet Reason: DM Ulcer/skin breakdown will have a volume reduction of 80% by week 12 Date Initiated: 09/30/2020 Date Inactivated: 09/30/2020 Target Resolution Date: 09/06/2020 Goal Status: Unmet Unmet Reason: DM Ulcer/skin breakdown will heal within 14 weeks Date Initiated: 09/30/2020 Target Resolution Date: 09/09/2020 Goal Status: Active Interventions: Assess patient/caregiver ability to obtain necessary supplies Assess ulceration(s) every visit Treatment Activities: Referred to DME Brenley Priore for dressing supplies : 06/07/2020 Notes: Electronic Signature(s) Signed: 10/17/2020 6:50:12 PM By: Gretta Cool, BSN, RN, CWS, Kim RN, BSN Entered By: Gretta Cool, BSN, RN, CWS, Kim on 10/13/2020 15:41:51 Redmon, Jermario ([id number]) Darr, Mattthew ([id number]) -------------------------------------------------------------------------------- Pain Assessment Details Patient Name: [name] Date of Service: 10/13/2020 3:30 PM Medical Record Number: [id number] Patient Account Number: [id number] Date of Birth/Sex: 05-Sep-1961 (59 y.o. M) Treating RN: Cornell Barman Primary Care Aylissa Heinemann: PATIENT, NO Other Clinician: Referring Cieanna Stormes: Merrie Roof Treating Iden Stripling/Extender: Skipper Cliche in Treatment: 18 Active Problems Location of Pain Severity and Description of Pain Patient Has Paino No Site Locations Pain Management and Medication Current Pain Management: Notes Patient denies pain at this time. Electronic Signature(s) Signed: 10/17/2020 6:50:12 PM By: Gretta Cool, BSN, RN, CWS, Kim RN, BSN Entered By: Gretta Cool, BSN, RN, CWS, Kim on  10/13/2020 15:32:56 Fusco, Emaad ([id number]) -------------------------------------------------------------------------------- Patient/Caregiver Education Details Patient Name: [name] Date of Service: 10/13/2020 3:30 PM Medical Record Number: [id number] Patient Account Number: [id number] Date of Birth/Gender: 04-15-1961 (59 y.o. M) Treating RN: Cornell Barman Primary Care Physician: PATIENT, NO Other Clinician: Referring Physician: Merrie Roof Treating Physician/Extender: Skipper Cliche in Treatment: 18 Education Assessment Education Provided To: Patient Education Topics Provided Wound/Skin Impairment: Handouts: Caring for Your Ulcer Methods: Demonstration, Explain/Verbal Responses: State content correctly Electronic Signature(s) Signed: 10/17/2020 6:50:12 PM By: Gretta Cool, BSN, RN, CWS, Kim RN, BSN Entered By: Gretta Cool, BSN, RN, CWS, Kim on 10/13/2020 16:40:36 Gargus, Mayson ([id number]) -------------------------------------------------------------------------------- Wound Assessment Details Patient Name: [name] Date of Service: 10/13/2020 3:30 PM Medical Record Number: [id number] Patient Account Number: [id number] Date of Birth/Sex: May 12, 1961 (59 y.o. M) Treating RN: Cornell Barman Primary Care Tawfiq Favila: PATIENT, NO Other Clinician: Referring Brion Hedges: Merrie Roof Treating Cartier Mapel/Extender: Skipper Cliche in Treatment: 18 Wound Status Wound Number: 2 Primary Etiology: Diabetic Wound/Ulcer of the Lower Extremity Wound Location: Left, Anterior Toe Great Wound Status: Open Wounding Event: Gradually Appeared Comorbid  History: Cataracts, Type II Diabetes, Neuropathy Date Acquired: 05/07/2020 Weeks Of Treatment: 18 Clustered Wound: No Photos Wound Measurements Length: (cm) 0.2 % R Width: (cm) 0.2 % R Depth: (cm) 0.2 Epi Area: (cm) 0.031 Tu Volume: (cm) 0.006 Un eduction in Area: 97.6% eduction in Volume: 97.7% thelialization: Large (67-100%) nneling:  No dermining: No Wound Description Classification: Grade 1 Fo Wound Margin: Epibole Sl Exudate Amount: Medium Exudate Type: Serous Exudate Color: amber ul Odor After Cleansing: No ough/Fibrino Yes Wound Bed Granulation Amount: Large (67-100%) Exposed Structure Necrotic Amount: None Present (0%) Fascia Exposed: No Fat Layer (Subcutaneous Tissue) Exposed: Yes Tendon Exposed: No Muscle Exposed: No Joint Exposed: No Bone Exposed: No Assessment Notes Callus removed Treatment Notes Wound #2 (Left, Anterior Toe Great) Notes Prisma Ag, gauze, conform, tape Rady, Roch ([id number]) Electronic Signature(s) Signed: 10/17/2020 6:50:12 PM By: Gretta Cool, BSN, RN, CWS, Kim RN, BSN Entered By: Gretta Cool, BSN, RN, CWS, Kim on 10/13/2020 15:41:35 Mcqueary, Zamauri ([id number]) -------------------------------------------------------------------------------- Vitals Details Patient Name: [name] Date of Service: 10/13/2020 3:30 PM Medical Record Number: [id number] Patient Account Number: [id number] Date of Birth/Sex: Oct 17, 1961 (59 y.o. M) Treating RN: Cornell Barman Primary Care Zyionna Pesce: PATIENT, NO Other Clinician: Referring Irlene Crudup: Merrie Roof Treating Luther Newhouse/Extender: Skipper Cliche in Treatment: 18 Vital Signs Time Taken: 15:32 Temperature (F): 97.5 Height (in): 72 Pulse (bpm): 87 Weight (lbs): 249 Respiratory Rate (breaths/min): 16 Body Mass Index (BMI): 33.8 Blood Pressure (mmHg): 133/78 Reference Range: 80 - 120 mg / dl Electronic Signature(s) Signed: 10/17/2020 6:50:12 PM By: Gretta Cool, BSN, RN, CWS, Kim RN, BSN Entered By: Gretta Cool, BSN, RN, CWS, Kim on 10/13/2020 15:32:28

## 2020-10-20 ENCOUNTER — Encounter: Payer: Self-pay | Admitting: Family Medicine

## 2020-10-20 ENCOUNTER — Other Ambulatory Visit: Payer: Self-pay

## 2020-10-20 ENCOUNTER — Encounter: Payer: BC Managed Care – PPO | Admitting: Physician Assistant

## 2020-10-20 ENCOUNTER — Ambulatory Visit (INDEPENDENT_AMBULATORY_CARE_PROVIDER_SITE_OTHER): Payer: BC Managed Care – PPO | Admitting: Family Medicine

## 2020-10-20 VITALS — BP 108/73 | HR 88 | Temp 98.6°F | Ht 72.0 in | Wt 236.6 lb

## 2020-10-20 DIAGNOSIS — E1140 Type 2 diabetes mellitus with diabetic neuropathy, unspecified: Secondary | ICD-10-CM

## 2020-10-20 DIAGNOSIS — R350 Frequency of micturition: Secondary | ICD-10-CM

## 2020-10-20 DIAGNOSIS — L97512 Non-pressure chronic ulcer of other part of right foot with fat layer exposed: Secondary | ICD-10-CM | POA: Diagnosis not present

## 2020-10-20 DIAGNOSIS — L03115 Cellulitis of right lower limb: Secondary | ICD-10-CM

## 2020-10-20 DIAGNOSIS — M869 Osteomyelitis, unspecified: Secondary | ICD-10-CM

## 2020-10-20 DIAGNOSIS — I739 Peripheral vascular disease, unspecified: Secondary | ICD-10-CM

## 2020-10-20 DIAGNOSIS — E11621 Type 2 diabetes mellitus with foot ulcer: Secondary | ICD-10-CM | POA: Diagnosis not present

## 2020-10-20 DIAGNOSIS — D649 Anemia, unspecified: Secondary | ICD-10-CM

## 2020-10-20 DIAGNOSIS — N179 Acute kidney failure, unspecified: Secondary | ICD-10-CM | POA: Diagnosis not present

## 2020-10-20 DIAGNOSIS — L97522 Non-pressure chronic ulcer of other part of left foot with fat layer exposed: Secondary | ICD-10-CM | POA: Diagnosis not present

## 2020-10-20 DIAGNOSIS — Z89421 Acquired absence of other right toe(s): Secondary | ICD-10-CM | POA: Insufficient documentation

## 2020-10-20 DIAGNOSIS — Z794 Long term (current) use of insulin: Secondary | ICD-10-CM

## 2020-10-20 LAB — MICROSCOPIC EXAMINATION
Bacteria, UA: NONE SEEN
RBC, Urine: NONE SEEN /hpf (ref 0–2)

## 2020-10-20 LAB — URINALYSIS, ROUTINE W REFLEX MICROSCOPIC
Bilirubin, UA: NEGATIVE
Glucose, UA: NEGATIVE
Leukocytes,UA: NEGATIVE
Nitrite, UA: NEGATIVE
RBC, UA: NEGATIVE
Specific Gravity, UA: 1.025 (ref 1.005–1.030)
Urobilinogen, Ur: 0.2 mg/dL (ref 0.2–1.0)
pH, UA: 5 (ref 5.0–7.5)

## 2020-10-20 MED ORDER — PEN NEEDLES 32G X 6 MM MISC
1.0000 [IU] | Freq: Every day | 11 refills | Status: DC | PRN
Start: 2020-10-20 — End: 2021-01-06

## 2020-10-20 MED ORDER — GLIPIZIDE 5 MG PO TABS
5.0000 mg | ORAL_TABLET | Freq: Two times a day (BID) | ORAL | 1 refills | Status: DC
Start: 2020-10-20 — End: 2021-02-22

## 2020-10-20 MED ORDER — ATORVASTATIN CALCIUM 20 MG PO TABS: 20.0000 mg | ORAL_TABLET | ORAL | 1 refills | Status: DC

## 2020-10-20 MED ORDER — METFORMIN HCL 1000 MG PO TABS
1000.0000 mg | ORAL_TABLET | Freq: Two times a day (BID) | ORAL | 1 refills | Status: DC
Start: 2020-10-20 — End: 2021-02-22

## 2020-10-20 MED ORDER — TRESIBA FLEXTOUCH 100 UNIT/ML ~~LOC~~ SOPN
18.0000 [IU] | PEN_INJECTOR | Freq: Every day | SUBCUTANEOUS | 2 refills | Status: DC
Start: 2020-10-20 — End: 2021-02-22

## 2020-10-20 NOTE — Progress Notes (Addendum)
DARON, STUTZ ([id number]) Visit Report for 10/20/2020 Chief Complaint Document Details Patient Name: [name], [name] Date of Service: 10/20/2020 10:00 AM Medical Record Number: [id number] Patient Account Number: [id number] Date of Birth/Sex: 1961/06/21 (59 y.o. M) Treating RN: Yevonne Pax Primary Care Provider: PATIENT, NO Other Clinician: Referring Provider: Roosvelt Maser Treating Provider/Extender: Rowan Blase in Treatment: 19 Information Obtained from: Patient Chief Complaint Left foot/toe ulcers Electronic Signature(s) Signed: 10/20/2020 9:50:50 AM By: Lenda Kelp PA-C Entered By: Lenda Kelp on 10/20/2020 09:50:50 [name], [name] ([id number]) -------------------------------------------------------------------------------- Debridement Details Patient Name: [name] Date of Service: 10/20/2020 10:00 AM Medical Record Number: [id number] Patient Account Number: [id number] Date of Birth/Sex: 26-Dec-1960 (59 y.o. M) Treating RN: Rogers Blocker Primary Care Provider: PATIENT, NO Other Clinician: Referring Provider: Roosvelt Maser Treating Provider/Extender: Rowan Blase in Treatment: 19 Debridement Performed for Wound #2 Left,Anterior Toe Great Assessment: Performed By: Physician Nelida Meuse., PA-C Debridement Type: Debridement Severity of Tissue Pre Debridement: Limited to breakdown of skin Level of Consciousness (Pre- Awake and Alert procedure): Pre-procedure Verification/Time Out Yes - 10:12 Taken: Start Time: 10:12 Pain Control: Lidocaine 4% Topical Solution Total Area Debrided (L x W): 0.1 (cm) x 0.1 (cm) = 0.01 (cm) Tissue and other material Viable, Non-Viable, Callus, Skin: Epidermis debrided: Level: Skin/Epidermis Debridement Description: Selective/Open Wound Instrument: Curette Bleeding: Minimum Hemostasis Achieved: Pressure End Time: 10:16 Procedural Pain: 0 Post Procedural Pain: 0 Response to Treatment: Procedure was tolerated  well Level of Consciousness (Post- Awake and Alert procedure): Post Debridement Measurements of Total Wound Length: (cm) 0.1 Width: (cm) 0.1 Depth: (cm) 0.1 Volume: (cm) 0.001 Character of Wound/Ulcer Post Debridement: Stable Severity of Tissue Post Debridement: Limited to breakdown of skin Post Procedure Diagnosis Same as Pre-procedure Electronic Signature(s) Signed: 10/20/2020 11:46:22 AM By: Lajean Manes Signed: 10/20/2020 4:18:53 PM By: Lenda Kelp PA-C Entered By: Phillis Haggis, Dondra Prader on 10/20/2020 10:17:41 [name], [name] ([id number]) -------------------------------------------------------------------------------- HPI Details Patient Name: [name] Date of Service: 10/20/2020 10:00 AM Medical Record Number: [id number] Patient Account Number: [id number] Date of Birth/Sex: August 16, 1961 (59 y.o. M) Treating RN: Yevonne Pax Primary Care Provider: PATIENT, NO Other Clinician: Referring Provider: Roosvelt Maser Treating Provider/Extender: Rowan Blase in Treatment: 19 History of Present Illness HPI Description: 06/10/2020 upon evaluation today patient appears to be doing poorly in regard to his right plantar foot, left great toe, and left second toe. Unfortunately he has significant wounds at these locations but especially in regard to the second toe which does have a lot of necrotic tissue over the distal portion of the toe. The great toe does have some necrotic tissue as well. With that being said the patient tells me that this has been going on for 1-2 months and seems to be getting worse especially in regard to the toe. He has not had any specific x-rays to identify obvious signs of osteomyelitis up to this point that something would likely get a different today as well. With that being said he also has not had any current arterial studies which I think is something else that we probably will look into performing at this point. He does have a history  of diabetes mellitus type 2, and diabetic neuropathy. Currently he has been using normal shoes for ambulation though he does have some kind of external device so that he does not have to have actual still toed boots anymore at work which he states has helped nonetheless we is at work he does have to have something that protects his  feet as such per policy. He tells me he is having to do a lot more walking right now compared to normal but he can try to see if he can change something in that regard. 06/16/2020 upon evaluation today patient appears to be doing well all things considered with regard to his wounds. I do not feel like anything is it any worse and overall I feel like the Bactrim has done well. The culture that I reviewed did not reveal any specific organisms unfortunately that would help tailor treatment. I did actually review the x-ray as well it does appear that he has evidence of osteomyelitis of the distal tuft of the second toe left foot. That was discussed with him today. He likely will need a referral to infectious disease. 06/30/2020 on evaluation today patient appears to be doing well at this time in regard to his wounds all things considered. The erythema has improved he does have evidence of osteomyelitis again the second toe on the left foot but at the other sites there were no obvious signs. He does have wounds bilaterally on his feet and toes. These are to require sharp debridement today. He is still pending as far as his appointment with infectious disease. With that being said this is actually scheduled for 29 July which is this month. He also has an appointment on the 27th with vascular. Overall at least we are getting to the point where I think we are getting something is taken care of here. 07/12/2020 patient [facility] They have placed him on medications which include Flagyl 500 mg and Levaquin 750 mg for the next several weeks until  September 9. Other than that he overall seems to be looking quite well and I am very pleased with how things are progressing at this point. 07/21/2020 on evaluation today patient appears to be doing well with regard to his wounds. He is on Flagyl as well as Levaquin which is helping to take care of the infection it appears. Fortunately there is no signs of active infection systemically at this time which is also good news. I am very pleased with where things stand. 07/28/20 on evaluation today patient appears to be doing well with regard to his wounds. He has been tolerating the dressing changes without complication we've been using the alginate currently. With that being said I do feel like the patient is making good progress albeit slow. 08/04/2020 on evaluation today patient's wound actually is showing signs of good improvement which is great news and overall very pleased with where things stand. Especially in regard to the right foot. The left great toe unfortunately is still having a lot of drainage and is somewhat macerated. This actually does appear to go deeper and is actually showing signs of bone exposure in the base of the wound. Again he is on antibiotics and I think we could consider HBO therapy in fact this is something I may discuss with him next week depending on how the toe looks. With that being said in the short-term I think I am going to actually recommend that he change the dressing more frequently right has been doing every other day 08/16/2020 on evaluation today patient appears to be doing well with regard to his ulcers. He is going require some debridement of both sites remaining he still is having a lot of drainage from the toe but again this seems to be making some progress here just very slowly. I did  discuss with him the possibility of hyperbarics today but he really was not interested in that at this point. 08/25/2020 upon evaluation today patient's wounds appear to be doing  okay at this point. He is progressing quite slowly at this time but nonetheless does seem to be making fairly good progress at this time which is good news. Fortunately there is no signs of active infection at this time. He does have an appointment with his infectious disease doctor next week on Thursday therefore he will not be able to make the appointment here we will likely see him in 2 weeks in that regard. 09/09/2020 upon evaluation today patient appears to be doing well currently in regard to his wounds. He still has openings at both locations although the great toe on the left does appear to be a little smaller to me which is good news. Fortunately there is no signs of active infection at this time which is excellent. 09/30/2020 on evaluation today patient appears to be doing well with regard to his left great toe ulcer this is measuring a little smaller. In regard to the right foot plantar unfortunately this is not really significantly improved. I think the biggest issue here is he continues to wear his work shoes and walks sufficiently on this to because it did not really be able to close as effectively as it needs to. We previously discussed total contact casting but the patient states he cannot do this and work and he also can do this and drive which is essential to him. He tells me that he does not really have any time off and he is very concerned about the fact that he has to continue to work. Again this is a discussion we have had multiple times in the past as well. 10/13/2020 on evaluation today patient appears to be doing decently well in regard to his left great toe ulcer this is not as macerated mainly likely due to the fact that he is not working currently in his work boots for a large portion of his day. 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 He ended up going to urgent care subsequently to the  hospital and has since had the amputation. I did not view that today as it is wrapped up and he recently just saw his podiatrist in that regard. Fortunately there is no signs of active infection at this time which is great news. [name], [name] ([id number]) 10/20/2020 upon evaluation today patient appears to be doing actually very well in regard to his toe ulcer. He has been tolerating the dressing changes without complication. Fortunately there is no evidence of active infection which is great news and in general I been very pleased with how things seem to be progressing. No fevers, chills, nausea, vomiting, or diarrhea. Electronic Signature(s) Signed: 10/20/2020 2:51:40 PM By: Lenda Kelp PA-C Entered By: Lenda Kelp on 10/20/2020 14:51:40 [name], [name] ([id number]) -------------------------------------------------------------------------------- Physical Exam Details Patient Name: [name] Date of Service: 10/20/2020 10:00 AM Medical Record Number: [id number] Patient Account Number: [id number] Date of Birth/Sex: Mar 03, 1961 (59 y.o. M) Treating RN: Yevonne Pax Primary Care Provider: PATIENT, NO Other Clinician: Referring Provider: Roosvelt Maser Treating Provider/Extender: Rowan Blase in Treatment: 39 Constitutional Well-nourished and well-hydrated in no acute distress. Respiratory normal breathing without difficulty. Psychiatric this patient is able to make decisions and demonstrates good insight into disease process. Alert and Oriented x  3. pleasant and cooperative. Notes Upon inspection patient's wound bed actually showed signs of good granulation at this time and excellent epithelization. In fact this appears to be just a very tiny slit once I cleaned away some of the callus around the edges of the wound and again some slough from the base of the wound. Overall I feel like he is doing extremely well and I think being out of work has made a big difference for  him. Electronic Signature(s) Signed: 10/20/2020 2:52:05 PM By: Lenda Kelp PA-C Entered By: Lenda Kelp on 10/20/2020 14:52:04 [name], [name] ([id number]) -------------------------------------------------------------------------------- Physician Orders Details Patient Name: [name] Date of Service: 10/20/2020 10:00 AM Medical Record Number: [id number] Patient Account Number: [id number] Date of Birth/Sex: 10-Mar-1961 (59 y.o. 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 Anesthetic (add to Medication List) Wound #2 Left,Anterior Toe Great o Topical Lidocaine 4% cream applied to wound bed prior to debridement (In Clinic Only). Primary Wound Dressing Wound #2 Left,Anterior Toe Great o Other: - triple antibiotic ointment Secondary Dressing Wound #2 Left,Anterior Toe Great o Other - bandaid Dressing Change Frequency Wound #2 Left,Anterior Toe Great o Change dressing every day. Follow-up Appointments Wound #2 Left,Anterior Toe Great o Return Appointment in 1 week. Edema Control Wound #2 Left,Anterior Toe Great o Elevate legs to the level of the heart and pump ankles as often as possible Additional Orders / Instructions Wound #2 Left,Anterior Toe Great o Increase protein intake. Electronic Signature(s) Signed: 10/20/2020 11:46:22 AM By: Phillis Haggis, Dondra Prader Signed: 10/20/2020  4:18:53 PM By: Lenda Kelp PA-C Entered By: Phillis Haggis, Dondra Prader on 10/20/2020 10:20:10 [name], [name] ([id number]) -------------------------------------------------------------------------------- Problem List Details Patient Name: [name] Date of Service: 10/20/2020 10:00 AM Medical Record Number: [id number] Patient Account Number: [id number] Date of Birth/Sex: 1961-07-07 (59 y.o. M) Treating RN: Yevonne Pax Primary Care Provider: PATIENT, NO Other Clinician: Referring Provider: Roosvelt Maser Treating Provider/Extender: Rowan Blase in Treatment: 19 Active Problems ICD-10 Encounter Code Description Active Date MDM Diagnosis E11.621 Type 2 diabetes mellitus with foot ulcer 06/07/2020 No Yes L97.522 Non-pressure chronic ulcer of other part of left foot with fat layer 06/07/2020 No Yes exposed L97.512 Non-pressure chronic ulcer of other part of right foot with fat layer 06/07/2020 No Yes exposed E11.40 Type 2 diabetes mellitus with diabetic neuropathy, unspecified 06/07/2020 No Yes Inactive Problems Resolved Problems Electronic Signature(s) Signed: 10/20/2020 9:49:39 AM By: Lenda Kelp PA-C Entered By: Lenda Kelp on 10/20/2020 09:49:38 [name], [name] ([id number]) -------------------------------------------------------------------------------- Progress Note Details Patient Name: [name] Date of Service: 10/20/2020 10:00 AM Medical Record Number: [id number] Patient Account Number: [id number] Date of Birth/Sex: 1961/10/15 (59 y.o. M) Treating RN: Yevonne Pax Primary Care Provider: PATIENT, NO Other Clinician: Referring Provider: Roosvelt Maser Treating Provider/Extender: Rowan Blase in Treatment: 19 Subjective Chief Complaint Information obtained from Patient Left foot/toe ulcers History of Present Illness (HPI) 06/10/2020 upon evaluation today patient appears to be doing poorly in regard to his right plantar foot, left great toe, and  left second  toe. Unfortunately he has significant wounds at these locations but especially in regard to the second toe which does have a lot of necrotic tissue over the distal portion of the toe. The great toe does have some necrotic tissue as well. With that being said the patient tells me that this has been going on for 1-2 months and seems to be getting worse especially in regard to the toe. He has not had any specific x-rays to identify obvious signs of osteomyelitis up to this point that something would likely get a different today as well. With that being said he also has not had any current arterial studies which I think is something else that we probably will look into performing at this point. He does have a history of diabetes mellitus type 2, and diabetic neuropathy. Currently he has been using normal shoes for ambulation though he does have some kind of external device so that he does not have to have actual still toed boots anymore at work which he states has helped nonetheless we is at work he does have to have something that protects his feet as such per policy. He tells me he is having to do a lot more walking right now compared to normal but he can try to see if he can change something in that regard. 06/16/2020 upon evaluation today patient appears to be doing well all things considered with regard to his wounds. I do not feel like anything is it any worse and overall I feel like the Bactrim has done well. The culture that I reviewed did not reveal any specific organisms unfortunately that would help tailor treatment. I did actually review the x-ray as well it does appear that he has evidence of osteomyelitis of the distal tuft of the second toe left foot. That was discussed with him today. He likely will need a referral to infectious disease. 06/30/2020 on evaluation today patient appears to be doing well at this time in regard to his wounds all things considered. The erythema has improved he does  have evidence of osteomyelitis again the second toe on the left foot but at the other sites there were no obvious signs. He does have wounds bilaterally on his feet and toes. These are to require sharp debridement today. He is still pending as far as his appointment with infectious disease. With that being said this is actually scheduled for 29 July which is this month. He also has an appointment on the 27th with vascular. Overall at least we are getting to the point where I think we are getting something is taken care of here. 07/12/2020 patient did have his appointment with [facility] infectious disease. They have placed him on medications which include Flagyl 500 mg and Levaquin 750 mg for the next several weeks until September 9. Other than that he overall seems to be looking quite well and I am very pleased with how things are progressing at this point. 07/21/2020 on evaluation today patient appears to be doing well with regard to his wounds. He is on Flagyl as well as Levaquin which is helping to take care of the infection it appears. Fortunately there is no signs of active infection systemically at this time which is also good news. I am very pleased with where things stand. 07/28/20 on evaluation today patient appears to be doing well with regard to his wounds. He has been tolerating the dressing changes without complication we've been using the alginate currently. With  that being said I do feel like the patient is making good progress albeit slow. 08/04/2020 on evaluation today patient's wound actually is showing signs of good improvement which is great news and overall very pleased with where things stand. Especially in regard to the right foot. The left great toe unfortunately is still having a lot of drainage and is somewhat macerated. This actually does appear to go deeper and is actually showing signs of bone exposure in the base of the wound. Again he is on antibiotics and I think we could consider  HBO therapy in fact this is something I may discuss with him next week depending on how the toe looks. With that being said in the short-term I think I am going to actually recommend that he change the dressing more frequently right has been doing every other day 08/16/2020 on evaluation today patient appears to be doing well with regard to his ulcers. He is going require some debridement of both sites remaining he still is having a lot of drainage from the toe but again this seems to be making some progress here just very slowly. I did discuss with him the possibility of hyperbarics today but he really was not interested in that at this point. 08/25/2020 upon evaluation today patient's wounds appear to be doing okay at this point. He is progressing quite slowly at this time but nonetheless does seem to be making fairly good progress at this time which is good news. Fortunately there is no signs of active infection at this time. He does have an appointment with his infectious disease doctor next week on Thursday therefore he will not be able to make the appointment here we will likely see him in 2 weeks in that regard. 09/09/2020 upon evaluation today patient appears to be doing well currently in regard to his wounds. He still has openings at both locations although the great toe on the left does appear to be a little smaller to me which is good news. Fortunately there is no signs of active infection at this time which is excellent. 09/30/2020 on evaluation today patient appears to be doing well with regard to his left great toe ulcer this is measuring a little smaller. In regard to the right foot plantar unfortunately this is not really significantly improved. I think the biggest issue here is he continues to wear his work shoes and walks sufficiently on this to because it did not really be able to close as effectively as it needs to. We previously discussed total contact casting but the patient states  he cannot do this and work and he also can do this and drive which is essential to him. He tells me that he does not really have any time off and he is very concerned about the fact that he has to continue to work. Again this is a discussion we have had multiple times in the past as well. [name], [name] ([id number]) 10/13/2020 on evaluation today patient appears to be doing decently well in regard to his left great toe ulcer this is not as macerated mainly likely due to the fact that he is not working currently in his work boots for a large portion of his day. 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 He ended up going to urgent care subsequently to the hospital and has since had the amputation. I did not view that today as  it is wrapped up and he recently just saw his podiatrist in that regard. Fortunately there is no signs of active infection at this time which is great news. 10/20/2020 upon evaluation today patient appears to be doing actually very well in regard to his toe ulcer. He has been tolerating the dressing changes without complication. Fortunately there is no evidence of active infection which is great news and in general I been very pleased with how things seem to be progressing. No fevers, chills, nausea, vomiting, or diarrhea. Objective Constitutional Well-nourished and well-hydrated in no acute distress. Vitals Time Taken: 9:59 AM, Height: 72 in, Weight: 249 lbs, BMI: 33.8, Temperature: 97.8 F, Pulse: 81 bpm, Respiratory Rate: 16 breaths/min, Blood Pressure: 116/79 mmHg. Respiratory normal breathing without difficulty. Psychiatric this patient is able to make decisions and demonstrates good insight into disease process. Alert and Oriented x 3. pleasant and cooperative. General Notes: Upon inspection patient's wound bed actually showed signs of good granulation at this time and excellent  epithelization. In fact this appears to be just a very tiny slit once I cleaned away some of the callus around the edges of the wound and again some slough from the base of the wound. Overall I feel like he is doing extremely well and I think being out of work has made a big difference for him. Integumentary (Hair, Skin) Wound #2 status is Open. Original cause of wound was Gradually Appeared. The wound is located on the [employer]. The wound measures 0.1cm length x 0.1cm width x 0.1cm depth; 0.008cm^2 area and 0.001cm^3 volume. There is Fat Layer (Subcutaneous Tissue) exposed. There is no tunneling or undermining noted. There is a small amount of serous drainage noted. The wound margin is epibole. There is large (67-100%) pink granulation within the wound bed. There is no necrotic tissue within the wound bed. Assessment Active Problems ICD-10 Type 2 diabetes mellitus with foot ulcer Non-pressure chronic ulcer of other part of left foot with fat layer exposed Non-pressure chronic ulcer of other part of right foot with fat layer exposed Type 2 diabetes mellitus with diabetic neuropathy, unspecified Procedures Wound #2 Pre-procedure diagnosis of Wound #2 is a Diabetic Wound/Ulcer of the Lower Extremity located on the Left,Anterior Toe Great .Severity of Tissue Pre Debridement is: Limited to breakdown of skin. There was a Selective/Open Wound Skin/Epidermis Debridement with a total area of 0.01 sq cm performed by Nelida Meuse., PA-C. With the following instrument(s): Curette to remove Viable and Non-Viable tissue/material. Material removed includes Callus and Skin: Epidermis and after achieving pain control using Lidocaine 4% Topical Solution. A time out was conducted at 10:12, prior to the start of the procedure. A Minimum amount of bleeding was controlled with Pressure. The procedure was tolerated well with a pain level of 0 throughout and a pain level of 0 following the procedure.  Post Debridement Measurements: 0.1cm length x 0.1cm width x 0.1cm depth; 0.001cm^3 volume. Character of Wound/Ulcer Post Debridement is stable. Severity of Tissue Post Debridement is: Limited to breakdown of skin. Post procedure Diagnosis Wound #2: Same as Pre-Procedure [name], [name] ([id number]) Plan Wound Cleansing: Wound #2 Left,Anterior Toe Great: Clean wound with Normal Saline. Anesthetic (add to Medication List): Wound #2 Left,Anterior Toe Great: Topical Lidocaine 4% cream applied to wound bed prior to debridement (In Clinic Only). Primary Wound Dressing: Wound #2 Left,Anterior Toe Great: Other: - triple antibiotic ointment Secondary Dressing: Wound #2 Left,Anterior Toe Great: Other - bandaid Dressing Change Frequency: Wound #2 Left,Anterior Toe Great:  Change dressing every day. Follow-up Appointments: Wound #2 Left,Anterior Toe Great: Return Appointment in 1 week. Edema Control: Wound #2 Left,Anterior Toe Great: Elevate legs to the level of the heart and pump ankles as often as possible Additional Orders / Instructions: Wound #2 Left,Anterior Toe Great: Increase protein intake. 1. Would recommend currently that we go ahead and continue with the wound care measures as before specifically with regard to the pressure relief and offloading he is taking it easy on this left foot which is good news. In fact because of his surgery on the right he is having to take it easy no matter what any way. 2. I am also can recommend at this time that the patient continue to monitor for any signs of infection. Obviously if he notices any purulent drainage or increased drainage he should let me know otherwise we will see how things appear next week. 3. Since he is doing so well and really this appears to be almost healed_recommend a little bit of triple antibiotic ointment applied to the wound bed followed by just a Band-Aid to cover he should change this 1-2 times a day. We will see patient  back for reevaluation in 1 week here in the clinic. If anything worsens or changes patient will contact our office for additional recommendations. Electronic Signature(s) Signed: 10/20/2020 2:53:05 PM By: Lenda Kelp PA-C Entered By: Lenda Kelp on 10/20/2020 14:53:05 [name], [name] ([id number]) -------------------------------------------------------------------------------- SuperBill Details Patient Name: [name] Date of Service: 10/20/2020 Medical Record Number: [id number] Patient Account Number: [id number] Date of Birth/Sex: 20-Feb-1961 (59 y.o. M) Treating RN: Yevonne Pax Primary Care Provider: PATIENT, NO Other Clinician: Referring Provider: Roosvelt Maser Treating Provider/Extender: Rowan Blase in Treatment: 19 Diagnosis Coding ICD-10 Codes Code Description E11.621 Type 2 diabetes mellitus with foot ulcer 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.512 Non-pressure chronic ulcer of other part of right foot with fat layer exposed E11.40 Type 2 diabetes mellitus with diabetic neuropathy, unspecified Facility Procedures CPT4 Code: [id number] Description: 97597 - DEBRIDE WOUND 1ST 20 SQ CM OR < Modifier: Quantity: 1 CPT4 Code: Description: ICD-10 Diagnosis Description L97.522 Non-pressure chronic ulcer of other part of left foot with fat layer expo Modifier: sed Quantity: Physician Procedures CPT4 Code: 1950932 Description: 97597 - WC PHYS DEBR WO ANESTH 20 SQ CM Modifier: Quantity: 1 CPT4 Code: Description: ICD-10 Diagnosis Description L97.522 Non-pressure chronic ulcer of other part of left foot with fat layer expo Modifier: sed Quantity: Electronic Signature(s) Signed: 10/20/2020 2:53:21 PM By: Lenda Kelp PA-C Entered By: Lenda Kelp on 10/20/2020 14:53:19

## 2020-10-20 NOTE — Assessment & Plan Note (Signed)
Finishing keflex. Following with podiatry. Wrapped today due to surgery. Continue to monitor.

## 2020-10-20 NOTE — Assessment & Plan Note (Signed)
Following with podiatry. Doing well. Continue to monitor.

## 2020-10-20 NOTE — Progress Notes (Signed)
BP 108/73   Pulse 88   Temp 98.6 F (37 C) (Oral)   Ht 6' (1.829 m)   Wt 236 lb 9.6 oz (107.3 kg)   SpO2 98%   BMI 32.09 kg/m    Subjective:    Patient ID: [name], male    DOB: Sep 12, 1961, 59 y.o.   MRN: [id number]  HPI: [name] is a 59 y.o. male  Chief Complaint  Patient presents with  . Diabetes  . Hospitalization Follow-up    Cellulitis and amputation of R 5th toe.   Transition of Care Hospital Follow up.   Hospital/Facility: [facility] D/C Physician: Dr. 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 Accu-Chek FastClix Lancets MISC USE LANCET(S) TO CHECK GLUCOSE UP TO 4 TIMES DAILY  . ACCU-CHEK GUIDE test strip USE UP TO 4 TIMES DAILY AS DIRECTED  . Ascorbic Acid (VITAMIN C CR) 1500 MG TBCR Take 1,500 mg by mouth daily.  Marland Kitchen aspirin EC 81 MG tablet Take 81 mg by mouth daily.  Marland Kitchen atorvastatin (LIPITOR) 20 MG tablet Take 1 tablet (20 mg total) by mouth every other day.  . blood glucose meter kit and supplies Dispense based on patient and insurance preference. Use up to four times daily as directed. (FOR ICD-10 E10.9, E11.9).  . cephALEXin (KEFLEX) 500 MG capsule Take 1 capsule (500 mg total) by mouth every 6 (six) hours for 14 days.  . Cinnamon 500 MG capsule Take 500 mg by mouth daily.  . Garlic 3419 MG CAPS Take 1,000 mg by mouth daily.  Marland Kitchen glipiZIDE (GLUCOTROL) 5 MG tablet Take 1 tablet (5 mg total) by mouth 2 (two) times daily before a meal.  .  ibuprofen (ADVIL) 400 MG tablet Take 1 tablet (400 mg total) by mouth every 6 (six) hours as needed for moderate pain.  Marland Kitchen insulin degludec (TRESIBA FLEXTOUCH) 100 UNIT/ML FlexTouch Pen Inject 18 Units into the skin daily.  . Insulin Pen Needle (PEN NEEDLES) 32G X 6 MM MISC 1 Units by Does not apply route daily as needed.  . metFORMIN (GLUCOPHAGE) 1000 MG tablet Take 1 tablet (1,000 mg total) by mouth 2 (two) times daily with a meal.  . oxyCODONE-acetaminophen (PERCOCET/ROXICET) 5-325 MG tablet Take 1 tablet by mouth every 8 (eight) hours as needed for severe pain.  . [DISCONTINUED] atorvastatin (LIPITOR) 20 MG tablet Take 1 tablet (20 mg total) by mouth every other day.  . [DISCONTINUED] glipiZIDE (GLUCOTROL) 5 MG tablet Take 1 tablet (5 mg total) by mouth 2 (two) times daily before a meal. (Patient taking differently: Take 5 mg by mouth 2 (two) times daily. Breakfast and dinner)  . [DISCONTINUED] insulin degludec (TRESIBA FLEXTOUCH) 100 UNIT/ML FlexTouch Pen Inject 0.18 mLs (18 Units total) into the skin daily.  . [DISCONTINUED] Insulin Pen Needle (PEN NEEDLES) 32G X 6 MM MISC 1 Units by Does not apply route daily as needed.  . [DISCONTINUED] metFORMIN (GLUCOPHAGE) 1000 MG tablet Take 1 tablet (1,000 mg total) by mouth 2 (two) times daily with a meal. (Patient taking differently: Take 1,000  mg by mouth 2 (two) times daily with a meal. Breakfast and dinner)  . saccharomyces boulardii (FLORASTOR) 250 MG capsule Take 1 capsule (250 mg total) by mouth 2 (two) times daily for 14 days. (Patient not taking: Reported on 10/20/2020)   No facility-administered encounter medications on file as of 10/20/2020.  Per hospitalist: "Cellulitis of the right foot with osteomyelitis of the fifth metatarsal with small abscess/sepsis, present on admission Patient had SIRS criteria at admission including tachycardia leukocytosis. Sepsis physiology has resolved. Patient noted to have erythema of the right foot. MRI raises  concern for a small abscess concern for osteomyelitis involving the metatarsal head. Patient started on vancomycin. Seen by podiatry. 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 Wound cultures growing staph aureus. Blood cultures are negative so far. Discussed with ID regarding transitioning to oral antibiotics. Sensitivities reviewed. Allergies reviewed with patient. Reported allergy noted to amoxicillin which caused a rash about 8 years ago. It was even not clear at that time whether the rash was secondary to amoxicillin or not. Patient agreeable to use start Keflex which was initiated on 10/30. No reaction noted so far. WBC continues to improve.  Is afebrile.  Does not report any side effects to the Keflex.  Per podiatry notes they have managed to get clean margins.  They think that they have resected all of the infectious area.  We will plan for 2 weeks of antibacterials.   Patient seen by physical therapy and has ambulated.  He will need a walker.  Mild dehydration/prerenal azotemia Patient had mildly elevated BUN. This has improved with IV hydration.  Diabetes mellitus type 2, uncontrolled with hyperglycemia HbA1c was 7.3 in June. Noted to be 9.6 during this hospital stay. Patient on glipizide Metformin and Tresiba at home.   May resume his home medications  Hyperlipidemia Continue Lipitor.  Normocytic anemia Stable.  Hyponatremia Improved.  Obesity Estimated body mass index is 32.92 kg/m as calculated from the following: Height as of this encounter: 6' (1.829 m). Weight as of this encounter: 110.1 kg."  Diagnostic Tests Reviewed: CLINICAL DATA:  Right foot ulceration.  EXAM: RIGHT FOOT COMPLETE - 3+ VIEW  COMPARISON:  July 19, 2017.  FINDINGS: There is no evidence of fracture or dislocation. There is no evidence of arthropathy. Moderate posterior calcaneal spurring is noted. No lytic destruction is seen to suggest  osteomyelitis. Probable focal soft tissue swelling and ulceration is seen lateral to the fifth metatarsophalangeal joint.  IMPRESSION: Probable focal soft tissue swelling and ulceration is seen lateral to the fifth metatarsophalangeal joint. No lytic destruction is seen to suggest osteomyelitis.  CLINICAL DATA:  Focal soft tissue swelling and ulceration along the lateral fifth metatarsal phalangeal joint.  EXAM: MRI OF THE RIGHT FOREFOOT WITHOUT CONTRAST  TECHNIQUE: Multiplanar, multisequence MR imaging of the right was performed. No intravenous contrast was administered.  COMPARISON:  None.  FINDINGS: Bones/Joint/Cartilage  Mildly increased T2 hyperintense signal with question of minimally decreased T1 hypointensity seen within the fifth metatarsal head. No other areas of cortical destruction or periosteal reaction. There is mildly increased T2 hyperintense signal seen also at the base of the fifth proximal phalanx. Joint space loss with subchondral cystic changes are seen at the fifth and fourth metatarsal base cuboid articulation.  Ligaments  The Lisfranc ligaments are intact.  Muscles and Tendons  There is increased feathery signal seen throughout the muscles of the forefoot with mild atrophy. The flexor and extensor tendons are intact.  Soft tissues  There is  a focal area of ulceration seen on the plantar surface of the fifth metatarsal head measuring approximately 5 mm in transverse dimension. There is a fluid-filled sinus tract seen at this level. Non loculated fluid is seen surrounding the base of the fifth metatarsal head. There is diffuse overlying skin thickening and subcutaneous edema. There is also a multilocular small fluid collection seen along the lateral aspect of the fifth metatarsal head measuring approximately 1.5 x 0.8 cm. Diffuse overlying dorsal subcutaneous edema and skin thickening is seen.  IMPRESSION: Area of ulceration  seen on the lateral plantar base of the fifth metatarsal head with a sinus tract and probable small soft tissue abscess measuring 1.5 x 0.8 cm.  Findings which could be suggestive of early osteomyelitis of the fifth metatarsal head.  Probable reactive marrow at the fifth proximal phalanx.  CLINICAL DATA:  Postoperative radiograph following amputation of the fifth ray  EXAM: RIGHT FOOT COMPLETE - 3+ VIEW  COMPARISON:  Radiograph 10/06/2019, MRI 10/05/2020  FINDINGS: There are postsurgical changes from amputation of the fifth ray at the level of the proximal fifth metatarsal diaphysis with overlying soft tissue changes and bandaging material. First ray is in notable extension with additional clawtoe deformities of the second through fourth rays similar to comparison. Slight angulation across the second D IP is similar to prior. No acute complication is evident. No new sites of ulceration or concerning acute osseous abnormality. Stable degenerative changes throughout the foot and imaged ankle. Bidirectional calcaneal spurs.  IMPRESSION: Postsurgical changes from amputation of the fifth ray at the level of the proximal fifth metatarsal diaphysis. No acute complication is Evident.  Disposition: Home  Consults: Podiatry  Discharge Instructions:  Follow up here and with podiatry  Disease/illness Education: Discussed today  Home Health/Community Services Discussions/Referrals: N/A  Establishment or re-establishment of referral orders for community resources: N/A  Discussion with other health care providers: None  Assessment and Support of treatment regimen adherence: Good  Appointments Coordinated with: Patient  Education for self-management, independent living, and ADLs: Discussed today  Since getting out of the hospital, Theus has been feeling pretty well. He notes that he got off on his medicine for a while before he went into the medicine. He hadn't been  taking his medicine every day. He's been doing a lot better with that. Wound is healing up well. He's following with podiatry.   DIABETES Hypoglycemic episodes:no Polydipsia: no Polyuria: yes Visual disturbance: no Chest pain: no Paresthesias: no Glucose Monitoring: yes  Accucheck frequency: Daily  Fasting glucose: 97-117 Taking Insulin?: yes  Long acting insulin: 18 units of tresiba Blood Pressure Monitoring: not checking Retinal Examination: Up to Date Foot Exam: Up to Date Diabetic Education: Completed Pneumovax: Up to Date Influenza: Not up to Date Aspirin: yes  He is otherwise feeling well today with no other concerns or complaints  Relevant past medical, surgical, family and social history reviewed and updated as indicated. Interim medical history since our last visit reviewed. Allergies and medications reviewed and updated.  Review of Systems  Constitutional: Negative.   Respiratory: Negative.   Cardiovascular: Negative.   Gastrointestinal: Negative.   Genitourinary: Positive for frequency and urgency. Negative for decreased urine volume, difficulty urinating, discharge, dysuria, enuresis, flank pain, genital sores, hematuria, penile pain, penile swelling, scrotal swelling and testicular pain.  Musculoskeletal: Negative.   Skin: Negative.   Psychiatric/Behavioral: Negative.     Per HPI unless specifically indicated above     Objective:    BP 108/73  Pulse 88   Temp 98.6 F (37 C) (Oral)   Ht 6' (1.829 m)   Wt 236 lb 9.6 oz (107.3 kg)   SpO2 98%   BMI 32.09 kg/m   Wt Readings from Last 3 Encounters:  10/20/20 236 lb 9.6 oz (107.3 kg)  10/09/20 237 lb 7 oz (107.7 kg)  10/05/20 246 lb (111.6 kg)    Physical Exam Vitals and nursing note reviewed.  Constitutional:      General: He is not in acute distress.    Appearance: Normal appearance. He is not ill-appearing, toxic-appearing or diaphoretic.  HENT:     Head: Normocephalic and atraumatic.      Right Ear: External ear normal.     Left Ear: External ear normal.     Nose: Nose normal.     Mouth/Throat:     Mouth: Mucous membranes are moist.     Pharynx: Oropharynx is clear.  Eyes:     General: No scleral icterus.       Right eye: No discharge.        Left eye: No discharge.     Extraocular Movements: Extraocular movements intact.     Conjunctiva/sclera: Conjunctivae normal.     Pupils: Pupils are equal, round, and reactive to light.  Cardiovascular:     Rate and Rhythm: Normal rate and regular rhythm.     Pulses: Normal pulses.     Heart sounds: Normal heart sounds. No murmur heard.  No friction rub. No gallop.   Pulmonary:     Effort: Pulmonary effort is normal. No respiratory distress.     Breath sounds: Normal breath sounds. No stridor. No wheezing, rhonchi or rales.  Chest:     Chest wall: No tenderness.  Musculoskeletal:        General: Normal range of motion.     Cervical back: Normal range of motion and neck supple.     Comments: Surgical boot and dressing on R foot  Skin:    General: Skin is warm and dry.     Capillary Refill: Capillary refill takes less than 2 seconds.     Coloration: Skin is not jaundiced or pale.     Findings: No bruising, erythema, lesion or rash.  Neurological:     General: No focal deficit present.     Mental Status: He is alert and oriented to person, place, and time. Mental status is at baseline.  Psychiatric:        Mood and Affect: Mood normal.        Behavior: Behavior normal.        Thought Content: Thought content normal.        Judgment: Judgment normal.     Results for orders placed or performed during the hospital encounter of 10/05/20  Culture, blood (routine x 2)   Specimen: BLOOD  Result Value Ref Range   Specimen Description BLOOD BLOOD LEFT FOREARM    Special Requests      BOTTLES DRAWN AEROBIC AND ANAEROBIC Blood Culture results may not be optimal due to an inadequate volume of blood received in culture bottles    Culture      NO GROWTH 5 DAYS Performed at [facility], [address], [place], [place] [address]    Report Status 10/10/2020 FINAL   Culture, blood (routine x 2)   Specimen: BLOOD  Result Value Ref Range   Specimen Description BLOOD BLOOD RIGHT FOREARM    Special Requests  BOTTLES DRAWN AEROBIC AND ANAEROBIC Blood Culture adequate volume   Culture      NO GROWTH 5 DAYS Performed at [facility], [address] [place], [place] [address]    Report Status 10/10/2020 FINAL   Respiratory Panel by RT PCR (Flu A&B, Covid) - Nasopharyngeal Swab   Specimen: Nasopharyngeal Swab  Result Value Ref Range   SARS Coronavirus 2 by RT PCR NEGATIVE NEGATIVE   Influenza A by PCR NEGATIVE NEGATIVE   Influenza B by PCR NEGATIVE NEGATIVE  Aerobic/Anaerobic Culture (surgical/deep wound)   Specimen: Wound  Result Value Ref Range   Specimen Description      WOUND Performed at [facility], [address] [place], [place] [address]    Special Requests      NONE Performed at [place] [place], [place] [address]    Gram Stain NO WBC SEEN MODERATE GRAM POSITIVE COCCI     Culture      ABUNDANT STAPHYLOCOCCUS AUREUS RARE BACTEROIDES UNIFORMIS BETA LACTAMASE NEGATIVE Performed at [facility], [place] [address] [place], [place] [address]    Report Status 10/12/2020 FINAL    Organism ID, Bacteria STAPHYLOCOCCUS AUREUS       Susceptibility   Staphylococcus aureus - MIC*    CIPROFLOXACIN <=0.5 SENSITIVE Sensitive     ERYTHROMYCIN <=0.25 SENSITIVE Sensitive     GENTAMICIN <=0.5 SENSITIVE Sensitive     OXACILLIN <=0.25 SENSITIVE Sensitive     TETRACYCLINE <=1 SENSITIVE Sensitive     VANCOMYCIN 1 SENSITIVE Sensitive     TRIMETH/SULFA <=10 SENSITIVE Sensitive     CLINDAMYCIN <=0.25 SENSITIVE Sensitive     RIFAMPIN <=0.5 SENSITIVE Sensitive     Inducible Clindamycin NEGATIVE Sensitive     * ABUNDANT STAPHYLOCOCCUS AUREUS   Aerobic/Anaerobic Culture (surgical/deep wound)   Specimen: PATH Digit amputation; Tissue  Result Value Ref Range   Specimen Description TISSUE    Special Requests 5TH METATARSAL    Gram Stain NO WBC SEEN NO ORGANISMS SEEN     Culture      RARE STAPHYLOCOCCUS AUREUS CRITICAL RESULT CALLED TO, READ BACK BY AND VERIFIED WITH: RN J. Carney Living [phone] [id number] FCP NO ANAEROBES ISOLATED Performed at [facility], [place] [address], [place],  [address]    Report Status 10/13/2020 FINAL    Organism ID, Bacteria STAPHYLOCOCCUS AUREUS       Susceptibility   Staphylococcus aureus - MIC*    CIPROFLOXACIN <=0.5 SENSITIVE Sensitive     ERYTHROMYCIN 0.5 SENSITIVE Sensitive     GENTAMICIN <=0.5 SENSITIVE Sensitive     OXACILLIN <=0.25 SENSITIVE Sensitive     TETRACYCLINE <=1 SENSITIVE Sensitive     VANCOMYCIN 1 SENSITIVE Sensitive     TRIMETH/SULFA <=10 SENSITIVE Sensitive     CLINDAMYCIN <=0.25 SENSITIVE Sensitive     RIFAMPIN <=0.5 SENSITIVE Sensitive     Inducible Clindamycin NEGATIVE Sensitive     * RARE STAPHYLOCOCCUS AUREUS  Comprehensive metabolic panel  Result Value Ref Range   Sodium 131 (L) 135 - 145 mmol/L   Potassium 4.0 3.5 - 5.1 mmol/L   Chloride 95 (L) 98 - 111 mmol/L   CO2 24 22 - 32 mmol/L   Glucose, Bld 324 (H) 70 - 99 mg/dL   BUN 32 (H) 6 - 20 mg/dL   Creatinine, Ser 1.06 0.61 - 1.24 mg/dL   Calcium 8.3 (L) 8.9 - 10.3 mg/dL   Total Protein 7.3 6.5 - 8.1 g/dL   Albumin  3.5 3.5 - 5.0 g/dL   AST 25 15 - 41 U/L   ALT 21 0 - 44 U/L   Alkaline Phosphatase 57 38 - 126 U/L   Total Bilirubin 1.2 0.3 - 1.2 mg/dL   GFR, Estimated >60 >60 mL/min   Anion gap 12 5 - 15  CBC with Differential  Result Value Ref Range   WBC 15.3 (H) 4.0 - 10.5 K/uL   RBC 3.85 (L) 4.22 - 5.81 MIL/uL   Hemoglobin 12.4 (L) 13.0 - 17.0 g/dL   HCT 35.3 (L) 39 - 52 %   MCV 91.7 80.0 - 100.0 fL   MCH 32.2 26.0 - 34.0 pg   MCHC 35.1 30.0 - 36.0 g/dL   RDW 11.8 11.5 - 15.5 %   Platelets 212  150 - 400 K/uL   nRBC 0.0 0.0 - 0.2 %   Neutrophils Relative % 87 %   Neutro Abs 13.3 (H) 1.7 - 7.7 K/uL   Lymphocytes Relative 7 %   Lymphs Abs 1.1 0.7 - 4.0 K/uL   Monocytes Relative 5 %   Monocytes Absolute 0.8 0.1 - 1.0 K/uL   Eosinophils Relative 0 %   Eosinophils Absolute 0.0 0.0 - 0.5 K/uL   Basophils Relative 0 %   Basophils Absolute 0.0 0.0 - 0.1 K/uL   Immature Granulocytes 1 %   Abs Immature Granulocytes 0.09 (H) 0.00 - 0.07 K/uL  Lactic acid, plasma  Result Value Ref Range   Lactic Acid, Venous 1.4 0.5 - 1.9 mmol/L  Urinalysis, Complete w Microscopic  Result Value Ref Range   Color, Urine YELLOW (A) YELLOW   APPearance HAZY (A) CLEAR   Specific Gravity, Urine 1.020 1.005 - 1.030   pH 5.0 5.0 - 8.0   Glucose, UA >=500 (A) NEGATIVE mg/dL   Hgb urine dipstick NEGATIVE NEGATIVE   Bilirubin Urine NEGATIVE NEGATIVE   Ketones, ur 5 (A) NEGATIVE mg/dL   Protein, ur 100 (A) NEGATIVE mg/dL   Nitrite NEGATIVE NEGATIVE   Leukocytes,Ua NEGATIVE NEGATIVE   RBC / HPF 0-5 0 - 5 RBC/hpf   WBC, UA 0-5 0 - 5 WBC/hpf   Bacteria, UA NONE SEEN NONE SEEN   Squamous Epithelial / LPF 0-5 0 - 5   Mucus PRESENT    Hyaline Casts, UA PRESENT   C-reactive protein  Result Value Ref Range   CRP 18.9 (H) <1.0 mg/dL  Sedimentation rate  Result Value Ref Range   Sed Rate 87 (H) 0 - 20 mm/hr  Protime-INR  Result Value Ref Range   Prothrombin Time 16.1 (H) 11.4 - 15.2 seconds   INR 1.3 (H) 0.8 - 1.2  Magnesium  Result Value Ref Range   Magnesium 2.0 1.7 - 2.4 mg/dL  Hemoglobin A1c  Result Value Ref Range   Hgb A1c MFr Bld 9.6 (H) 4.8 - 5.6 %   Mean Plasma Glucose 229 mg/dL  Hemoglobin A1c  Result Value Ref Range   Hgb A1c MFr Bld 9.6 (H) 4.8 - 5.6 %   Mean Plasma Glucose 229 mg/dL  HIV Antibody (routine testing w rflx)  Result Value Ref Range   HIV Screen 4th Generation wRfx Non Reactive Non Reactive  CBC with Differential/Platelet  Result Value Ref Range   WBC 12.6 (H) 4.0 -  10.5 K/uL   RBC 3.61 (L) 4.22 - 5.81 MIL/uL   Hemoglobin 11.8 (L) 13.0 - 17.0 g/dL   HCT 33.2 (L) 39 - 52 %   MCV 92.0 80.0 - 100.0 fL  MCH 32.7 26.0 - 34.0 pg   MCHC 35.5 30.0 - 36.0 g/dL   RDW 11.6 11.5 - 15.5 %   Platelets 185 150 - 400 K/uL   nRBC 0.0 0.0 - 0.2 %   Neutrophils Relative % 79 %   Neutro Abs 9.9 (H) 1.7 - 7.7 K/uL   Lymphocytes Relative 13 %   Lymphs Abs 1.7 0.7 - 4.0 K/uL   Monocytes Relative 6 %   Monocytes Absolute 0.8 0.1 - 1.0 K/uL   Eosinophils Relative 1 %   Eosinophils Absolute 0.1 0.0 - 0.5 K/uL   Basophils Relative 0 %   Basophils Absolute 0.0 0.0 - 0.1 K/uL   Immature Granulocytes 1 %   Abs Immature Granulocytes 0.10 (H) 0.00 - 0.07 K/uL  Comprehensive metabolic panel  Result Value Ref Range   Sodium 135 135 - 145 mmol/L   Potassium 3.7 3.5 - 5.1 mmol/L   Chloride 98 98 - 111 mmol/L   CO2 27 22 - 32 mmol/L   Glucose, Bld 170 (H) 70 - 99 mg/dL   BUN 17 6 - 20 mg/dL   Creatinine, Ser 0.68 0.61 - 1.24 mg/dL   Calcium 8.3 (L) 8.9 - 10.3 mg/dL   Total Protein 6.8 6.5 - 8.1 g/dL   Albumin 3.1 (L) 3.5 - 5.0 g/dL   AST 19 15 - 41 U/L   ALT 17 0 - 44 U/L   Alkaline Phosphatase 52 38 - 126 U/L   Total Bilirubin 1.0 0.3 - 1.2 mg/dL   GFR, Estimated >60 >60 mL/min   Anion gap 10 5 - 15  Magnesium  Result Value Ref Range   Magnesium 1.8 1.7 - 2.4 mg/dL  Glucose, capillary  Result Value Ref Range   Glucose-Capillary 276 (H) 70 - 99 mg/dL  CBC  Result Value Ref Range   WBC 11.9 (H) 4.0 - 10.5 K/uL   RBC 3.54 (L) 4.22 - 5.81 MIL/uL   Hemoglobin 11.3 (L) 13.0 - 17.0 g/dL   HCT 32.4 (L) 39 - 52 %   MCV 91.5 80.0 - 100.0 fL   MCH 31.9 26.0 - 34.0 pg   MCHC 34.9 30.0 - 36.0 g/dL   RDW 11.5 11.5 - 15.5 %   Platelets 209 150 - 400 K/uL   nRBC 0.0 0.0 - 0.2 %  Basic metabolic panel  Result Value Ref Range   Sodium 135 135 - 145 mmol/L   Potassium 3.8 3.5 - 5.1 mmol/L   Chloride 98 98 - 111 mmol/L   CO2 29 22 - 32 mmol/L   Glucose, Bld 199 (H) 70  - 99 mg/dL   BUN 14 6 - 20 mg/dL   Creatinine, Ser 0.64 0.61 - 1.24 mg/dL   Calcium 8.4 (L) 8.9 - 10.3 mg/dL   GFR, Estimated >60 >60 mL/min   Anion gap 8 5 - 15  Glucose, capillary  Result Value Ref Range   Glucose-Capillary 231 (H) 70 - 99 mg/dL  Glucose, capillary  Result Value Ref Range   Glucose-Capillary 200 (H) 70 - 99 mg/dL  Glucose, capillary  Result Value Ref Range   Glucose-Capillary 179 (H) 70 - 99 mg/dL  Glucose, capillary  Result Value Ref Range   Glucose-Capillary 124 (H) 70 - 99 mg/dL  CBC  Result Value Ref Range   WBC 13.8 (H) 4.0 - 10.5 K/uL   RBC 3.67 (L) 4.22 - 5.81 MIL/uL   Hemoglobin 11.8 (L) 13.0 - 17.0 g/dL   HCT 33.5 (L) 39 -  52 %   MCV 91.3 80.0 - 100.0 fL   MCH 32.2 26.0 - 34.0 pg   MCHC 35.2 30.0 - 36.0 g/dL   RDW 11.5 11.5 - 15.5 %   Platelets 236 150 - 400 K/uL   nRBC 0.0 0.0 - 0.2 %  Basic metabolic panel  Result Value Ref Range   Sodium 133 (L) 135 - 145 mmol/L   Potassium 4.3 3.5 - 5.1 mmol/L   Chloride 99 98 - 111 mmol/L   CO2 24 22 - 32 mmol/L   Glucose, Bld 355 (H) 70 - 99 mg/dL   BUN 19 6 - 20 mg/dL   Creatinine, Ser 0.77 0.61 - 1.24 mg/dL   Calcium 7.9 (L) 8.9 - 10.3 mg/dL   GFR, Estimated >60 >60 mL/min   Anion gap 10 5 - 15  Vancomycin, trough  Result Value Ref Range   Vancomycin Tr 8 (L) 15 - 20 ug/mL  Glucose, capillary  Result Value Ref Range   Glucose-Capillary 138 (H) 70 - 99 mg/dL  Glucose, capillary  Result Value Ref Range   Glucose-Capillary 223 (H) 70 - 99 mg/dL  Glucose, capillary  Result Value Ref Range   Glucose-Capillary 333 (H) 70 - 99 mg/dL  Glucose, capillary  Result Value Ref Range   Glucose-Capillary 313 (H) 70 - 99 mg/dL  Glucose, capillary  Result Value Ref Range   Glucose-Capillary 410 (H) 70 - 99 mg/dL   Comment 1 INSULIN GIVEN    Comment 2 Document in Chart   CBC  Result Value Ref Range   WBC 11.5 (H) 4.0 - 10.5 K/uL   RBC 3.55 (L) 4.22 - 5.81 MIL/uL   Hemoglobin 11.5 (L) 13.0 - 17.0 g/dL    HCT 32.7 (L) 39 - 52 %   MCV 92.1 80.0 - 100.0 fL   MCH 32.4 26.0 - 34.0 pg   MCHC 35.2 30.0 - 36.0 g/dL   RDW 11.6 11.5 - 15.5 %   Platelets 254 150 - 400 K/uL   nRBC 0.0 0.0 - 0.2 %  Basic metabolic panel  Result Value Ref Range   Sodium 137 135 - 145 mmol/L   Potassium 3.8 3.5 - 5.1 mmol/L   Chloride 105 98 - 111 mmol/L   CO2 29 22 - 32 mmol/L   Glucose, Bld 106 (H) 70 - 99 mg/dL   BUN 23 (H) 6 - 20 mg/dL   Creatinine, Ser 0.74 0.61 - 1.24 mg/dL   Calcium 8.4 (L) 8.9 - 10.3 mg/dL   GFR, Estimated >60 >60 mL/min   Anion gap 3 (L) 5 - 15  Glucose, capillary  Result Value Ref Range   Glucose-Capillary 236 (H) 70 - 99 mg/dL  Glucose, capillary  Result Value Ref Range   Glucose-Capillary 113 (H) 70 - 99 mg/dL  Glucose, capillary  Result Value Ref Range   Glucose-Capillary 183 (H) 70 - 99 mg/dL  Glucose, capillary  Result Value Ref Range   Glucose-Capillary 250 (H) 70 - 99 mg/dL  CBC  Result Value Ref Range   WBC 10.7 (H) 4.0 - 10.5 K/uL   RBC 3.77 (L) 4.22 - 5.81 MIL/uL   Hemoglobin 12.0 (L) 13.0 - 17.0 g/dL   HCT 34.6 (L) 39 - 52 %   MCV 91.8 80.0 - 100.0 fL   MCH 31.8 26.0 - 34.0 pg   MCHC 34.7 30.0 - 36.0 g/dL   RDW 11.6 11.5 - 15.5 %   Platelets 256 150 - 400 K/uL  nRBC 0.0 0.0 - 0.2 %  Basic metabolic panel  Result Value Ref Range   Sodium 136 135 - 145 mmol/L   Potassium 4.1 3.5 - 5.1 mmol/L   Chloride 100 98 - 111 mmol/L   CO2 29 22 - 32 mmol/L   Glucose, Bld 169 (H) 70 - 99 mg/dL   BUN 20 6 - 20 mg/dL   Creatinine, Ser 0.70 0.61 - 1.24 mg/dL   Calcium 8.7 (L) 8.9 - 10.3 mg/dL   GFR, Estimated >60 >60 mL/min   Anion gap 7 5 - 15  Glucose, capillary  Result Value Ref Range   Glucose-Capillary 207 (H) 70 - 99 mg/dL  Glucose, capillary  Result Value Ref Range   Glucose-Capillary 164 (H) 70 - 99 mg/dL  CBG monitoring, ED  Result Value Ref Range   Glucose-Capillary 197 (H) 70 - 99 mg/dL  CBG monitoring, ED  Result Value Ref Range    Glucose-Capillary 214 (H) 70 - 99 mg/dL  CBG monitoring, ED  Result Value Ref Range   Glucose-Capillary 140 (H) 70 - 99 mg/dL   Comment 1 Notify RN   CBG monitoring, ED  Result Value Ref Range   Glucose-Capillary 234 (H) 70 - 99 mg/dL   Comment 1 Notify RN   Surgical pathology  Result Value Ref Range   SURGICAL PATHOLOGY      SURGICAL PATHOLOGY CASE: [id number] PATIENT: Gaege Stanke Surgical Pathology Report     Specimen Submitted: A. 5th ray  Clinical History: 5th toe amputation right      DIAGNOSIS: A. 5TH RAY, RIGHT; AMPUTATION: - SUBCUTANEOUS ABSCESS. - NEGATIVE FOR ACUTE OSTEOMYELITIS. - RESECTION MARGINS ARE NEGATIVE FOR ACTIVE INFLAMMATION. - NEGATIVE FOR MALIGNANCY.   GROSS DESCRIPTION: A. Labeled: 5th ray Received: In formalin Size: 4.8 x 1.8 x 1.7 cm Description of lesion(s): There is a small amount of granular appearing tissue on the dorsal surface of the toe that measures approximately 0.8 x 0.4 cm.  There is a defect on the dorsal surface that measures 0.2 cm in diameter, which extends into the underlying soft tissue, however no discrete areas of ulceration are grossly noted. Proximal margin: The skin is soft tissue at the proximal margin appears grossly viable. Bone: The bone at the proximal margin is disarticulated and has a small portion missing but  is otherwise unremarkable.  There is a separately submitted portion of bone that measures 4.1 x 1.8 x 1.5 cm, which is cut across on one end.  The proximal margin on the toe and the cut end on the separate pieces are inked blue.  Other findings: Separately received is a piece of pink-red rubbery soft tissue, 2.8 x 2.3 x 0.5 cm  Block summary: 1-skin and soft tissue at margin of resection 2-skin and soft tissue from defect and granular area 3-bone proximal margin of toe 4-cut across margin separately submitted bone 5-section of separately submitted tissue  Tissue decalcification: Cassettes  3-4   Final Diagnosis performed by Betsy Pries, MD.   Electronically signed 10/12/2020 1:41:15PM The electronic signature indicates that the named Attending Pathologist has evaluated the specimen Technical component performed at [facility], [address] [place], [place] [address] Lab: [id number] Dir: Rush Farmer, MD, MMM  Professional component performed at [facility], [facility], [place], [place], [place] [address] Lab: [phone] Dir: Dellia Nims. Reuel Derby, MD       Assessment & Plan:   Problem List Items Addressed This Visit      Cardiovascular and  Mediastinum   PAD (peripheral artery disease) (HCC)    Will keep BP and cholesterol and sugars under good control. Continue aspirin. Call with any concerns.       Relevant Medications   atorvastatin (LIPITOR) 20 MG tablet     Endocrine   Type 2 diabetes mellitus with diabetic neuropathy, with long-term current use of insulin (HCC) - Primary    A1c up to 9.6 from 7.2 on last check. Currently on lantus, glipizide and metformin. Had been off his medicines for a few months off and on. Doing well with restarting them. Will check back in in 1 month. Due for A1c at the end of January.      Relevant Medications   atorvastatin (LIPITOR) 20 MG tablet   glipiZIDE (GLUCOTROL) 5 MG tablet   insulin degludec (TRESIBA FLEXTOUCH) 100 UNIT/ML FlexTouch Pen   metFORMIN (GLUCOPHAGE) 1000 MG tablet     Other   Cellulitis of right lower extremity    Finishing keflex. Following with podiatry. Wrapped today due to surgery. Continue to monitor.       Status post amputation of lesser toe of right foot [facility])    Following with podiatry. Doing well. Continue to monitor.        Other Visit Diagnoses    Osteomyelitis of right foot, unspecified type ([place])       S/P amputation. Continue to follow with podiatry and wound care. Call with any concerns.    AKI (acute kidney injury) ([place])       Rechecking levels today. Had  resolved in the hospital.    Relevant Orders   Basic metabolic panel   Normocytic anemia       Rechecking levels today. Await results. Treat as needed.    Relevant Orders   CBC with Differential/Platelet   Urinary frequency       Likely due to hyperglycemia. UA normal today. Continue to monitor.    Relevant Orders   Urinalysis, Routine w reflex microscopic       Follow up plan: Return in about 4 weeks (around 11/17/2020).   >30 minutes spent with patient today.

## 2020-10-20 NOTE — Assessment & Plan Note (Signed)
A1c up to 9.6 from 7.2 on last check. Currently on lantus, glipizide and metformin. Had been off his medicines for a few months off and on. Doing well with restarting them. Will check back in in 1 month. Due for A1c at the end of January.

## 2020-10-20 NOTE — Assessment & Plan Note (Signed)
Will keep BP and cholesterol and sugars under good control. Continue aspirin. Call with any concerns.

## 2020-10-20 NOTE — Progress Notes (Addendum)
[name], [name] ([id number]) Visit Report for 10/20/2020 Arrival Information Details Patient Name: [name], REASON Date of Service: 10/20/2020 10:00 AM Medical Record Number: [id number] Patient Account Number: [id number] Date of Birth/Sex: 1961-02-04 (59 y.o. M) Treating RN: Rogers Blocker Primary Care Addysen Louth: PATIENT, NO Other Clinician: Referring Jimmylee Ratterree: Roosvelt Maser Treating Virlee Stroschein/Extender: Rowan Blase in Treatment: 19 Visit Information History Since Last Visit Pain Present Now: No Patient Arrived: Cane Arrival Time: 09:59 Accompanied By: self Transfer Assistance: None Patient Identification Verified: Yes Secondary Verification Process Completed: Yes Patient Requires Transmission-Based Precautions: No Patient Has Alerts: Yes Patient Alerts: Type II Diabetic Electronic Signature(s) Signed: 10/20/2020 11:46:22 AM By: Phillis Haggis, Dondra Prader Entered By: Phillis Haggis, Dondra Prader on 10/20/2020 09:59:42 Broyles, Collin ([id number]) -------------------------------------------------------------------------------- [name] Date of Service: 10/20/2020 10:00 AM Medical Record Number: [id number] Patient Account Number: [id number] Date of Birth/Sex: [birth year] (59 y.o. M) Treating RN: Rogers Blocker Primary Care Kerra Guilfoil: PATIENT, NO Other Clinician: Referring Daelin Haste: Roosvelt Maser Treating Maquita Sandoval/Extender: Rowan Blase in Treatment: 19 Encounter Discharge Information Items Post Procedure Vitals Discharge Condition: Stable Temperature (F): 97.8 Ambulatory Status: Cane Pulse (bpm): 81 Discharge Destination: Home Respiratory Rate (breaths/min): 16 Transportation: Private Auto Blood Pressure (mmHg): 116/79 Accompanied By: self Schedule Follow-up Appointment: Yes Clinical Summary of Care: Electronic Signature(s) Signed: 10/20/2020 11:46:22 AM By: Phillis Haggis, Dondra Prader Entered By: Phillis Haggis, Dondra Prader on  10/20/2020 10:25:15 Hadlock, Dereke ([id number]) -------------------------------------------------------------------------------- Lower Extremity Assessment Details Patient Name: [name] Date of Service: 10/20/2020 10:00 AM Medical Record Number: [id number] Patient Account Number: [id number] Date of Birth/Sex: [birth year] (59 y.o. M) Treating RN: Rogers Blocker Primary Care Ahlam Piscitelli: PATIENT, NO Other Clinician: Referring Ashtian Villacis: Roosvelt Maser Treating Rashena Dowling/Extender: Allen Derry Weeks in Treatment: 19 Edema Assessment Assessed: [Left: Yes] [Right: No] Edema: [Left: N] [Right: o] Vascular Assessment Pulses: Dorsalis Pedis Palpable: [Left:Yes] Electronic Signature(s) Signed: 10/20/2020 11:46:22 AM By: Phillis Haggis, Dondra Prader Entered By: Phillis Haggis, Dondra Prader on 10/20/2020 10:08:25 Breeden, Eddie ([id number]) -------------------------------------------------------------------------------- Pain Assessment Details Patient Name: [name] Date of Service: 10/20/2020 10:00 AM Medical Record Number: [id number] Patient Account Number: [id number] Date of Birth/Sex: 1961-05-31 (59 y.o. M) Treating RN: Rogers Blocker Primary Care Venola Castello: PATIENT, NO Other Clinician: Referring Chanda Laperle: Roosvelt Maser Treating Raelin Pixler/Extender: Rowan Blase in Treatment: 19 Active Problems Location of Pain Severity and Description of Pain Patient Has Paino No Site Locations Rate the pain. Current Pain Level: 0 Pain Management and Medication Current Pain Management: Electronic Signature(s) Signed: 10/20/2020 11:46:22 AM By: Phillis Haggis, Dondra Prader Entered By: Phillis Haggis, Dondra Prader on 10/20/2020 10:02:12 Kemple, Mitsuo ([id number]) -------------------------------------------------------------------------------- Patient/Caregiver Education Details Patient Name: [name] Date of Service: 10/20/2020 10:00 AM Medical Record Number: [id number] Patient Account Number:  [id number] Date of Birth/Gender: 11/18/1961 (59 y.o. M) Treating RN: Rogers Blocker Primary Care Physician: PATIENT, NO Other Clinician: Referring Physician: Roosvelt Maser Treating Physician/Extender: Rowan Blase in Treatment: 79 Education Assessment Education Provided To: Patient Education Topics Provided Wound/Skin Impairment: Methods: Explain/Verbal Responses: State content correctly Electronic Signature(s) Signed: 10/20/2020 11:46:22 AM By: Phillis Haggis, Dondra Prader Entered By: Phillis Haggis, Dondra Prader on 10/20/2020 10:23:49 Hagood, Nasean ([id number]) -------------------------------------------------------------------------------- Wound Assessment Details Patient Name: [name] Date of Service: 10/20/2020 10:00 AM Medical Record Number: [id number] Patient Account Number: [id number] Date of Birth/Sex: 06/28/1961 (59 y.o. M) Treating RN: Rogers Blocker Primary Care Latoyia Tecson: PATIENT, NO Other Clinician: Referring Ibn Stief: Roosvelt Maser Treating Lynley Killilea/Extender: Rowan Blase in Treatment: 19 Wound Status Wound Number: 2 Primary Etiology: Diabetic Wound/Ulcer of the Lower Extremity Wound Location: Left, Anterior Toe  Great Wound Status: Open Wounding Event: Gradually Appeared Comorbid History: Cataracts, Type II Diabetes, Neuropathy Date Acquired: 05/07/2020 Weeks Of Treatment: 19 Clustered Wound: No Photos Wound Measurements Length: (cm) 0.1 Width: (cm) 0.1 Depth: (cm) 0.1 Area: (cm) 0.008 Volume: (cm) 0.001 % Reduction in Area: 99.4% % Reduction in Volume: 99.6% Epithelialization: Large (67-100%) Tunneling: No Undermining: No Wound Description Classification: Grade 1 Wound Margin: Epibole Exudate Amount: Small Exudate Type: Serous Exudate Color: amber Foul Odor After Cleansing: No Slough/Fibrino No Wound Bed Granulation Amount: Large (67-100%) Exposed Structure Granulation Quality: Pink Fascia Exposed: No Necrotic Amount: None Present  (0%) Fat Layer (Subcutaneous Tissue) Exposed: Yes Tendon Exposed: No Muscle Exposed: No Joint Exposed: No Bone Exposed: No Electronic Signature(s) Signed: 10/20/2020 11:46:22 AM By: Phillis Haggis, Dondra Prader Entered By: Phillis Haggis, Dondra Prader on 10/20/2020 10:07:52 Lowenthal, Min ([id number]) -------------------------------------------------------------------------------- Vitals Details Patient Name: [name] Date of Service: 10/20/2020 10:00 AM Medical Record Number: [id number] Patient Account Number: [id number] Date of Birth/Sex: [birth year] (59 y.o. M) Treating RN: Rogers Blocker Primary Care Laurelle Skiver: PATIENT, NO Other Clinician: Referring Ines Rebel: Roosvelt Maser Treating Tou Hayner/Extender: Rowan Blase in Treatment: 19 Vital Signs Time Taken: 09:59 Temperature (F): 97.8 Height (in): 72 Pulse (bpm): 81 Weight (lbs): 249 Respiratory Rate (breaths/min): 16 Body Mass Index (BMI): 33.8 Blood Pressure (mmHg): 116/79 Reference Range: 80 - 120 mg / dl Electronic Signature(s) Signed: 10/20/2020 11:46:22 AM By: Phillis Haggis, Dondra Prader Entered By: Phillis Haggis, Dondra Prader on 10/20/2020 10:02:03

## 2020-10-21 LAB — CBC WITH DIFFERENTIAL/PLATELET
Basophils Absolute: 0.1 10*3/uL (ref 0.0–0.2)
Basos: 1 %
EOS (ABSOLUTE): 0.1 10*3/uL (ref 0.0–0.4)
Eos: 1 %
Hematocrit: 39.6 % (ref 37.5–51.0)
Hemoglobin: 13.3 g/dL (ref 13.0–17.7)
Immature Grans (Abs): 0 10*3/uL (ref 0.0–0.1)
Immature Granulocytes: 0 %
Lymphocytes Absolute: 2.4 10*3/uL (ref 0.7–3.1)
Lymphs: 28 %
MCH: 31.8 pg (ref 26.6–33.0)
MCHC: 33.6 g/dL (ref 31.5–35.7)
MCV: 95 fL (ref 79–97)
Monocytes Absolute: 0.6 10*3/uL (ref 0.1–0.9)
Monocytes: 7 %
Neutrophils Absolute: 5.3 10*3/uL (ref 1.4–7.0)
Neutrophils: 63 %
Platelets: 310 10*3/uL (ref 150–450)
RBC: 4.18 x10E6/uL (ref 4.14–5.80)
RDW: 12.6 % (ref 11.6–15.4)
WBC: 8.4 10*3/uL (ref 3.4–10.8)

## 2020-10-21 LAB — BASIC METABOLIC PANEL
BUN/Creatinine Ratio: 20 (ref 9–20)
BUN: 20 mg/dL (ref 6–24)
CO2: 23 mmol/L (ref 20–29)
Calcium: 9.6 mg/dL (ref 8.7–10.2)
Chloride: 102 mmol/L (ref 96–106)
Creatinine, Ser: 0.98 mg/dL (ref 0.76–1.27)
GFR calc Af Amer: 99 mL/min/{1.73_m2} (ref >59)
GFR calc non Af Amer: 85 mL/min/{1.73_m2} (ref >59)
Glucose: 64 mg/dL — ABNORMAL LOW (ref 65–99)
Potassium: 4.9 mmol/L (ref 3.5–5.2)
Sodium: 140 mmol/L (ref 134–144)

## 2020-10-23 ENCOUNTER — Other Ambulatory Visit: Payer: Self-pay | Admitting: Family Medicine

## 2020-10-23 NOTE — Telephone Encounter (Signed)
° °  Notes to clinic:  filled by historical provider  Review for fill    Requested Prescriptions  Pending Prescriptions Disp Refills   ACCU-CHEK GUIDE test strip [Pharmacy Med Name: Accu-Chek Guide In Vitro Strip] 100 each 0    Sig: USE UP TO 4 TIMES DAILY AS DIRECTED      Endocrinology: Diabetes - Testing Supplies Passed - 10/23/2020 11:33 AM      Passed - Valid encounter within last 12 months    Recent Outpatient Visits           3 days ago Type 2 diabetes mellitus with diabetic neuropathy, with long-term current use of insulin (HCC)   [facility] Baldwin, Megan P, DO   5 months ago Type 2 diabetes mellitus with hyperglycemia, without long-term current use of insulin (HCC)   [name] Buckner, Megan P, DO   8 months ago Type 2 diabetes mellitus with hyperglycemia, without long-term current use of insulin [facility])   [facility], [place], [place]   1 year ago Type 2 diabetes mellitus with hyperglycemia, without long-term current use of insulin [facility])   [facility], [place], [place]   1 year ago Type 2 diabetes mellitus with hyperglycemia, without long-term current use of insulin [facility])   [facility], [place], [place]       Future Appointments             In 3 weeks Laural Benes, Oralia Rud, DO [employer], [facility]

## 2020-10-25 DIAGNOSIS — L97412 Non-pressure chronic ulcer of right heel and midfoot with fat layer exposed: Secondary | ICD-10-CM | POA: Diagnosis not present

## 2020-10-25 DIAGNOSIS — E11621 Type 2 diabetes mellitus with foot ulcer: Secondary | ICD-10-CM | POA: Diagnosis not present

## 2020-10-25 DIAGNOSIS — E1142 Type 2 diabetes mellitus with diabetic polyneuropathy: Secondary | ICD-10-CM | POA: Diagnosis not present

## 2020-10-27 ENCOUNTER — Encounter: Payer: BC Managed Care – PPO | Admitting: Physician Assistant

## 2020-10-27 ENCOUNTER — Other Ambulatory Visit: Payer: Self-pay

## 2020-10-27 DIAGNOSIS — E11621 Type 2 diabetes mellitus with foot ulcer: Secondary | ICD-10-CM | POA: Diagnosis not present

## 2020-10-27 DIAGNOSIS — E1140 Type 2 diabetes mellitus with diabetic neuropathy, unspecified: Secondary | ICD-10-CM | POA: Diagnosis not present

## 2020-10-27 DIAGNOSIS — L97512 Non-pressure chronic ulcer of other part of right foot with fat layer exposed: Secondary | ICD-10-CM | POA: Diagnosis not present

## 2020-10-27 DIAGNOSIS — L97522 Non-pressure chronic ulcer of other part of left foot with fat layer exposed: Secondary | ICD-10-CM | POA: Diagnosis not present

## 2020-10-27 NOTE — Progress Notes (Addendum)
CABLE, FEARN ([id number]) Visit Report for 10/27/2020 Chief Complaint Document Details Patient Name: [name], [name] Date of Service: 10/27/2020 1:30 PM Medical Record Number: [id number] Patient Account Number: [id number] Date of Birth/Sex: [birth year] (59 y.o. M) Treating RN: Huel Coventry Primary Care Provider: PATIENT, NO Other Clinician: Referring Provider: Roosvelt Maser Treating Provider/Extender: Rowan Blase in Treatment: 20 Information Obtained from: Patient Chief Complaint Left foot/toe ulcers Electronic Signature(s) Signed: 10/27/2020 1:32:31 PM By: Lenda Kelp PA-C Entered By: Lenda Kelp on 10/27/2020 13:32:31 [name], [name] ([id number]) -------------------------------------------------------------------------------- HPI Details Patient Name: [name] Date of Service: 10/27/2020 1:30 PM Medical Record Number: [id number] Patient Account Number: [id number] Date of Birth/Sex: 06-23-1961 (59 y.o. M) Treating RN: Huel Coventry Primary Care Provider: PATIENT, NO Other Clinician: Referring Provider: Roosvelt Maser Treating Provider/Extender: Rowan Blase in Treatment: 20 History of Present Illness HPI Description: 06/10/2020 upon evaluation today patient appears to be doing poorly in regard to his right plantar foot, left great toe, and left second toe. Unfortunately he has significant wounds at these locations but especially in regard to the second toe which does have a lot of necrotic tissue over the distal portion of the toe. The great toe does have some necrotic tissue as well. With that being said the patient tells me that this has been going on for 1-2 months and seems to be getting worse especially in regard to the toe. He has not had any specific x-rays to identify obvious signs of osteomyelitis up to this point that something would likely get a different today as well. With that being said he also has not had any current arterial studies which I think is  something else that we probably will look into performing at this point. He does have a history of diabetes mellitus type 2, and diabetic neuropathy. Currently he has been using normal shoes for ambulation though he does have some kind of external device so that he does not have to have actual still toed boots anymore at work which he states has helped nonetheless we is at work he does have to have something that protects his feet as such per policy. He tells me he is having to do a lot more walking right now compared to normal but he can try to see if he can change something in that regard. 06/16/2020 upon evaluation today patient appears to be doing well all things considered with regard to his wounds. I do not feel like anything is it any worse and overall I feel like the Bactrim has done well. The culture that I reviewed did not reveal any specific organisms unfortunately that would help tailor treatment. I did actually review the x-ray as well it does appear that he has evidence of osteomyelitis of the distal tuft of the second toe left foot. That was discussed with him today. He likely will need a referral to infectious disease. 06/30/2020 on evaluation today patient appears to be doing well at this time in regard to his wounds all things considered. The erythema has improved he does have evidence of osteomyelitis again the second toe on the left foot but at the other sites there were no obvious signs. He does have wounds bilaterally on his feet and toes. These are to require sharp debridement today. He is still pending as far as his appointment with infectious disease. With that being said this is actually scheduled for 29 July which is this month. He also has an appointment on the 27th with vascular. Overall  at least we are getting to the point where I think we are getting something is taken care of here. 07/12/2020 patient did have his appointment with [facility] infectious disease. They have placed him  on medications which include Flagyl 500 mg and Levaquin 750 mg for the next several weeks until September 9. Other than that he overall seems to be looking quite well and I am very pleased with how things are progressing at this point. 07/21/2020 on evaluation today patient appears to be doing well with regard to his wounds. He is on Flagyl as well as Levaquin which is helping to take care of the infection it appears. Fortunately there is no signs of active infection systemically at this time which is also good news. I am very pleased with where things stand. 07/28/20 on evaluation today patient appears to be doing well with regard to his wounds. He has been tolerating the dressing changes without complication we've been using the alginate currently. With that being said I do feel like the patient is making good progress albeit slow. 08/04/2020 on evaluation today patient's wound actually is showing signs of good improvement which is great news and overall very pleased with where things stand. Especially in regard to the right foot. The left great toe unfortunately is still having a lot of drainage and is somewhat macerated. This actually does appear to go deeper and is actually showing signs of bone exposure in the base of the wound. Again he is on antibiotics and I think we could consider HBO therapy in fact this is something I may discuss with him next week depending on how the toe looks. With that being said in the short-term I think I am going to actually recommend that he change the dressing more frequently right has been doing every other day 08/16/2020 on evaluation today patient appears to be doing well with regard to his ulcers. He is going require some debridement of both sites remaining he still is having a lot of drainage from the toe but again this seems to be making some progress here just very slowly. I did discuss with him the possibility of hyperbarics today but he really was not  interested in that at this point. 08/25/2020 upon evaluation today patient's wounds appear to be doing okay at this point. He is progressing quite slowly at this time but nonetheless does seem to be making fairly good progress at this time which is good news. Fortunately there is no signs of active infection at this time. He does have an appointment with his infectious disease doctor next week on Thursday therefore he will not be able to make the appointment here we will likely see him in 2 weeks in that regard. 09/09/2020 upon evaluation today patient appears to be doing well currently in regard to his wounds. He still has openings at both locations although the great toe on the left does appear to be a little smaller to me which is good news. Fortunately there is no signs of active infection at this time which is excellent. 09/30/2020 on evaluation today patient appears to be doing well with regard to his left great toe ulcer this is measuring a little smaller. In regard to the right foot plantar unfortunately this is not really significantly improved. I think the biggest issue here is he continues to wear his work shoes and walks sufficiently on this to because it did not really be able to close as effectively as it needs to. We  previously discussed total contact casting but the patient states he cannot do this and work and he also can do this and drive which is essential to him. He tells me that he does not really have any time off and he is very concerned about the fact that he has to continue to work. Again this is a discussion we have had multiple times in the past as well. 10/13/2020 on evaluation today patient appears to be doing decently well in regard to his left great toe ulcer this is not as macerated mainly likely due to the fact that he is not working currently in his work boots for a large portion of his day. 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 He ended up going to urgent care subsequently to the hospital and has since had the amputation. I did not view that today as it is wrapped up and he recently just saw his podiatrist in that regard. Fortunately there is no signs of active infection at this time which is great news. [name], [name] ([id number]) 10/20/2020 upon evaluation today patient appears to be doing actually very well in regard to his toe ulcer. He has been tolerating the dressing changes without complication. Fortunately there is no evidence of active infection which is great news and in general I been very pleased with how things seem to be progressing. No fevers, chills, nausea, vomiting, or diarrhea. 10/27/2020 on evaluation today patient's wound actually showing signs of good improvement. Fortunately there is no signs of active infection at this time. There does not appear to be any evidence of drainage significantly and I think that the biggest issue he is having is keeping his Band- Aid in place so that the wound does not dry out. Fortunately this is a better problem to have than it staying too wet like it was previous. I do believe we can manage this. Electronic Signature(s) Signed: 10/27/2020 5:08:01 PM By: Lenda Kelp PA-C Entered By: Lenda Kelp on 10/27/2020 17:08:01 [name], [name] ([id number]) -------------------------------------------------------------------------------- Physical Exam Details Patient Name: [name] Date of Service: 10/27/2020 1:30 PM Medical Record Number: [id number] Patient Account Number: [id number] Date of Birth/Sex: July 29, 1961 (59 y.o. M) Treating RN: Huel Coventry Primary Care Provider: PATIENT, NO Other Clinician: Referring Provider: Roosvelt Maser Treating Provider/Extender: Rowan Blase in Treatment: 20 Constitutional Well-nourished and well-hydrated in no acute distress. Cardiovascular no clubbing, cyanosis,  significant edema, <3 sec cap refill. Psychiatric this patient is able to make decisions and demonstrates good insight into disease process. Alert and Oriented x 3. pleasant and cooperative. Notes Upon inspection patient's wound actually showed signs of fairly good epithelial growth there does not appear to be any issues with significant complication with regard to healing I think that the biggest thing is keeping the area moist with an antibiotic ointment and just a Band-Aid this is very close to resolution. Electronic Signature(s) Signed: 10/27/2020 5:09:01 PM By: Lenda Kelp PA-C Entered By: Lenda Kelp on 10/27/2020 17:09:00 [name], [name] ([id number]) -------------------------------------------------------------------------------- Physician Orders Details Patient Name: [name] Date of Service: 10/27/2020 1:30 PM Medical Record Number: [id number] Patient Account Number: [id number] Date of Birth/Sex: 1960-12-24 (59 y.o. 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 Anesthetic (add to Medication List) Wound #2 Left,Anterior Toe Great o Topical Lidocaine 4% cream applied to wound bed prior to debridement (In Clinic Only). Primary Wound Dressing Wound #2 Left,Anterior Toe Great o Other: - triple antibiotic ointment Secondary Dressing Wound #2 Left,Anterior Toe Great o Other -  coverlet Dressing Change Frequency Wound #2 Left,Anterior Toe Great o Change dressing every day. Follow-up Appointments Wound #2 Left,Anterior Toe Great o Return Appointment in 2 weeks. Edema Control Wound #2 Left,Anterior Toe Great o Elevate legs to the level of the heart and pump ankles as often as possible Additional Orders / Instructions Wound #2 Left,Anterior Toe Great o Increase protein intake. Electronic Signature(s) Signed: 10/27/2020 3:23:20 PM By: Elliot GurneyWoody, BSN, RN, CWS, Kim RN, BSN Signed: 10/28/2020 4:40:14 PM By: Lenda KelpStone III, Dorothea Yow PA-C Entered By: Elliot GurneyWoody, BSN, RN, CWS, Kim on 10/27/2020 15:23:19 Vernet, Sanad ([id number]) -------------------------------------------------------------------------------- Problem List Details Patient Name: [name] LenzNORRIS, Larnell Date of Service: 10/27/2020 1:30 PM Medical Record Number: [id number] Patient Account Number: [id number] Date of Birth/Sex: 08/23/1961 (59 y.o. M) Treating RN: Huel CoventryWoody, Kim Primary Care Provider: PATIENT, NO Other Clinician: Referring Provider: Roosvelt MaserLane, Rachel Treating Provider/Extender: Rowan BlaseStone, Naveen Lorusso Weeks in Treatment: 20 Active Problems ICD-10 Encounter Code Description Active Date MDM Diagnosis E11.621 Type 2 diabetes mellitus with foot ulcer 06/07/2020 No Yes L97.522 Non-pressure chronic ulcer of other part of left foot with fat layer 06/07/2020 No Yes exposed L97.512 Non-pressure chronic ulcer of other part of right foot with fat layer 06/07/2020 No Yes exposed E11.40 Type 2 diabetes mellitus with diabetic neuropathy, unspecified 06/07/2020 No Yes Inactive Problems Resolved Problems Electronic Signature(s) Signed: 10/27/2020 1:32:25 PM By: Lenda KelpStone III, Luie Laneve PA-C Entered By: Lenda KelpStone III, Kearia Yin on 10/27/2020 13:32:25 [name], [name] ([id number]) -------------------------------------------------------------------------------- Progress Note Details Patient Name: [name] LenzNORRIS, Lander Date of Service: 10/27/2020 1:30 PM Medical  Record Number: [id number] Patient Account Number: [id number] Date of Birth/Sex: 10/19/1961 (59 y.o. M) Treating RN: Huel CoventryWoody, Kim Primary Care Provider: PATIENT, NO Other Clinician: Referring Provider: Roosvelt MaserLane, Rachel Treating Provider/Extender: Rowan BlaseStone, Kelliann Pendergraph Weeks in Treatment: 20 Subjective Chief Complaint Information obtained from Patient Left foot/toe ulcers History of Present Illness (HPI) 06/10/2020 upon evaluation today patient appears to be doing poorly in regard to his right plantar foot, left great toe, and left second toe. Unfortunately he has significant wounds at these locations but especially in regard to the second toe which does have a lot of necrotic tissue over the distal portion of the toe. The great toe does have some necrotic tissue as well. With that being said the patient tells me that this has been going on for 1-2 months and seems to be getting worse especially in regard to the toe. He has not had any specific x-rays to identify obvious signs of osteomyelitis up to this point that something would likely get a different today as well. With that being said he also has not had any current arterial studies which I think is something else that we probably will look into performing at this point. He does have a history of diabetes mellitus type 2, and diabetic neuropathy. Currently he has been using normal shoes for ambulation though he does have some kind of external device so that he does not  have to have actual still toed boots anymore at work which he states has helped nonetheless we is at work he does have to have something that protects his feet as such per policy. He tells me he is having to do a lot more walking right now compared to normal but he can try to see if he can change something in that regard. 06/16/2020 upon evaluation today patient appears to be doing well all things considered with regard to his wounds. I do not feel like anything is it any worse and overall I feel  like the Bactrim has done well. The culture that I reviewed did not reveal any specific organisms unfortunately that would help tailor treatment. I did actually review the x-ray as well it does appear that he has evidence of osteomyelitis of the distal tuft of the second toe left foot. That was discussed with him today. He likely will need a referral to infectious disease. 06/30/2020 on evaluation today patient appears to be doing well at this time in regard to his wounds all things considered. The erythema has improved he does have evidence of osteomyelitis again the second toe on the left foot but at the other sites there were no obvious signs. He does have wounds bilaterally on his feet and toes. These are to require sharp debridement today. He is still pending as far as his appointment with infectious disease. With that being said this is actually scheduled for 29 July which is this month. He also has an appointment on the 27th with vascular. Overall at least we are getting to the point where I think we are getting something is taken care of here. 07/12/2020 patient did have his appointment with [facility] infectious disease. They have placed him on medications which include Flagyl 500 mg and Levaquin 750 mg for the next several weeks until September 9. Other than that he overall seems to be looking quite well and I am very pleased with how things are progressing at this point. 07/21/2020 on evaluation today patient appears to be doing well with regard to his wounds. He is on Flagyl as well as Levaquin which is helping to take care of the infection it appears. Fortunately there is no signs of active infection systemically at this time which is also good news. I am very pleased with where things stand. 07/28/20 on evaluation today patient appears to be doing well with regard to his wounds. He has been tolerating the dressing changes without complication we've been using the alginate currently. With that being  said I do feel like the patient is making good progress albeit slow. 08/04/2020 on evaluation today patient's wound actually is showing signs of good improvement which is great news and overall very pleased with where things stand. Especially in regard to the right foot. The left great toe unfortunately is still having a lot of drainage and is somewhat macerated. This actually does appear to go deeper and is actually showing signs of bone exposure in the base of the wound. Again he is on antibiotics and I think we could consider HBO therapy in fact this is something I may discuss with him next week depending on how the toe looks. With that being said in the short-term I think I am going to actually recommend that he change the dressing more frequently right has been doing every other day 08/16/2020 on evaluation today patient appears to be doing well with regard to his ulcers. He is going require some debridement  of both sites remaining he still is having a lot of drainage from the toe but again this seems to be making some progress here just very slowly. I did discuss with him the possibility of hyperbarics today but he really was not interested in that at this point. 08/25/2020 upon evaluation today patient's wounds appear to be doing okay at this point. He is progressing quite slowly at this time but nonetheless does seem to be making fairly good progress at this time which is good news. Fortunately there is no signs of active infection at this time. He does have an appointment with his infectious disease doctor next week on Thursday therefore he will not be able to make the appointment here we will likely see him in 2 weeks in that regard. 09/09/2020 upon evaluation today patient appears to be doing well currently in regard to his wounds. He still has openings at both locations although the great toe on the left does appear to be a little smaller to me which is good news. Fortunately there is no signs  of active infection at this time which is excellent. 09/30/2020 on evaluation today patient appears to be doing well with regard to his left great toe ulcer this is measuring a little smaller. In regard to the right foot plantar unfortunately this is not really significantly improved. I think the biggest issue here is he continues to wear his work shoes and walks sufficiently on this to because it did not really be able to close as effectively as it needs to. We previously discussed total contact casting but the patient states he cannot do this and work and he also can do this and drive which is essential to him. He tells me that he does not really have any time off and he is very concerned about the fact that he has to continue to work. Again this is a discussion we have had multiple times in the past as well. [name], [name] ([id number]) 10/13/2020 on evaluation today patient appears to be doing decently well in regard to his left great toe ulcer this is not as macerated mainly likely due to the fact that he is not working currently in his work boots for a large portion of his day. 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 He ended up going to urgent care subsequently to the hospital and has since had the amputation. I did not view that today as it is wrapped up and he recently just saw his podiatrist in that regard. Fortunately there is no signs of active infection at this time which is great news. 10/20/2020 upon evaluation today patient appears to be doing actually very well in regard to his toe ulcer. He has been tolerating the dressing changes without complication. Fortunately there is no evidence of active infection which is great news and in general I been very pleased with how things seem to be progressing. No fevers, chills, nausea, vomiting, or diarrhea. 10/27/2020 on evaluation today patient's wound  actually showing signs of good improvement. Fortunately there is no signs of active infection at this time. There does not appear to be any evidence of drainage significantly and I think that the biggest issue he is having is keeping his Band- Aid in place so that the wound does not dry out. Fortunately this is a better problem to have than it staying too wet like it was previous.  I do believe we can manage this. Objective Constitutional Well-nourished and well-hydrated in no acute distress. Vitals Time Taken: 1:16 PM, Height: 72 in, Weight: 249 lbs, BMI: 33.8, Temperature: 98.1 F, Pulse: 92 bpm, Respiratory Rate: 16 breaths/min, Blood Pressure: 125/73 mmHg. Cardiovascular no clubbing, cyanosis, significant edema, Psychiatric this patient is able to make decisions and demonstrates good insight into disease process. Alert and Oriented x 3. pleasant and cooperative. General Notes: Upon inspection patient's wound actually showed signs of fairly good epithelial growth there does not appear to be any issues with significant complication with regard to healing I think that the biggest thing is keeping the area moist with an antibiotic ointment and just a Band- Aid this is very close to resolution. Integumentary (Hair, Skin) Wound #2 status is Open. Original cause of wound was Gradually Appeared. The wound is located on the [employer]. The wound measures 0.1cm length x 0.1cm width x 0.3cm depth; 0.008cm^2 area and 0.002cm^3 volume. There is Fat Layer (Subcutaneous Tissue) exposed. There is no tunneling or undermining noted. There is a small amount of serous drainage noted. The wound margin is epibole. There is large (67-100%) pink granulation within the wound bed. There is no necrotic tissue within the wound bed. 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 Anesthetic (add to Medication List): Wound #2 Left,Anterior Toe Great: Topical Lidocaine 4% cream applied to wound bed prior to debridement (In Clinic Only). Primary Wound Dressing: [name], [name] ([id number]) Wound #2 Left,Anterior Toe Great: Other: - triple antibiotic ointment Secondary Dressing: Wound #2 Left,Anterior Toe Great: Other - coverlet Dressing Change Frequency: Wound #2 Left,Anterior Toe Great: Change dressing every day. Follow-up Appointments: Wound #2 Left,Anterior Toe Great: Return Appointment in 2 weeks. Edema Control: Wound #2 Left,Anterior Toe Great: Elevate legs to the level of the heart and pump ankles as often as possible Additional Orders / Instructions: Wound #2 Left,Anterior Toe Great: Increase protein intake. 1. I would recommend currently that we going continue with the wound care measures as before using triple antibiotic ointment and a Band-Aid over the area. 2. I am also going to recommend at this time that the patient continue to keep the area covered at all times with a Band-Aid I recommend a finger type Band-Aid which will hold things in place better. 3. 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 We will see patient back for reevaluation in 1 week here in the clinic. If anything worsens or changes patient will contact our office for additional recommendations. Electronic Signature(s) Signed: 10/27/2020 5:09:41 PM By: Lenda Kelp PA-C Entered By: Lenda Kelp on 10/27/2020 17:09:41 [name], [name] ([id number]) -------------------------------------------------------------------------------- SuperBill  Details Patient Name: [name] Date of Service: 10/27/2020 Medical Record Number: [id number] Patient Account Number: [id number] Date of Birth/Sex: Sep 08, 1961 (59 y.o. 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 E11.40 Type 2 diabetes mellitus with diabetic neuropathy, unspecified Facility Procedures CPT4 Code: [id number] Description: [phone] - WOUND CARE VISIT-LEV 2 EST PT Modifier: Quantity: 1 Physician Procedures CPT4 Code: 6389373 Description: 99213 - WC PHYS LEVEL 3 - EST PT Modifier: Quantity: 1 CPT4 Code: Description: ICD-10 Diagnosis Description E11.621 Type 2 diabetes mellitus with foot ulcer L97.522 Non-pressure chronic ulcer of other part of left foot with fat layer ex L97.512 Non-pressure chronic ulcer of other part of right foot with fat layer e  E11.40 Type 2 diabetes mellitus with diabetic neuropathy, unspecified Modifier: posed xposed Quantity: Electronic Signature(s) Signed: 10/27/2020 5:10:06 PM By: Lenda Kelp PA-C Entered By: Lenda Kelp on 10/27/2020 17:10:06

## 2020-10-27 NOTE — Progress Notes (Signed)
[name], [name] ([id number]) Visit Report for 10/27/2020 Arrival Information Details Patient Name: [name], [name] Date of Service: 10/27/2020 1:30 PM Medical Record Number: [id number] Patient Account Number: [id number] Date of Birth/Sex: 1961/09/12 (59 y.o. M) [name] Primary Care [name]: PATIENT, NO Other Clinician: Referring [name]: [name] Treating [name]/Extender: [name] in Treatment: 20 Visit Information History Since Last Visit Pain Present Now: No Patient Arrived: Cane Arrival Time: 13:15 Accompanied By: self Transfer Assistance: None Patient Identification Verified: Yes Secondary Verification Process Completed: Yes Patient Requires Transmission-Based Precautions: No Patient Has Alerts: Yes Patient Alerts: Type II Diabetic Electronic Signature(s) Signed: 10/27/2020 4:35:09 PM By: [name], [name] Entered By: [name], [name] on 10/27/2020 13:16:06 [name], [name] ([id number]) -------------------------------------------------------------------------------- Clinic Level of Care Assessment Details Patient Name: [name] Date of Service: 10/27/2020 1:30 PM Medical Record Number: [id number] Patient Account Number: [id number] Date of Birth/Sex: January 04, 1961 (59 y.o. M) [name] Primary Care [name]: PATIENT, NO Other Clinician: Referring [name]: [name] Treating [name]/Extender: [name] in Treatment: 20 Clinic Level of Care Assessment Items TOOL 4 Quantity Score '[]'  - Use when only an EandM is performed on FOLLOW-UP visit 0 ASSESSMENTS - Nursing Assessment / Reassessment X - Reassessment of Co-morbidities (includes updates in patient status) 1 10 X- 1 5 Reassessment of Adherence to Treatment Plan ASSESSMENTS - Wound and Skin Assessment / Reassessment X - Simple Wound Assessment / Reassessment - one wound 1 5 '[]'  - 0 Complex Wound Assessment / Reassessment - multiple wounds '[]'  -  0 Dermatologic / Skin Assessment (not related to wound area) ASSESSMENTS - Focused Assessment '[]'  - Circumferential Edema Measurements - multi extremities 0 '[]'  - 0 Nutritional Assessment / Counseling / Intervention '[]'  - 0 Lower Extremity Assessment (monofilament, tuning fork, pulses) '[]'  - 0 Peripheral Arterial Disease Assessment (using hand held doppler) ASSESSMENTS - Ostomy and/or Continence Assessment and Care '[]'  - Incontinence Assessment and Management 0 '[]'  - 0 Ostomy Care Assessment and Management (repouching, etc.) PROCESS - Coordination of Care X - Simple Patient / Family Education for ongoing care 1 15 '[]'  - 0 Complex (extensive) Patient / Family Education for ongoing care '[]'  - 0 Staff obtains Programmer, systems, Records, Test Results / Process Orders '[]'  - 0 Staff telephones HHA, Nursing Homes / Clarify orders / etc '[]'  - 0 Routine Transfer to another Facility (non-emergent condition) '[]'  - 0 Routine Hospital Admission (non-emergent condition) '[]'  - 0 New Admissions / Biomedical engineer / Ordering NPWT, Apligraf, etc. '[]'  - 0 Emergency Hospital Admission (emergent condition) X- 1 10 Simple Discharge Coordination '[]'  - 0 Complex (extensive) Discharge Coordination PROCESS - Special Needs '[]'  - Pediatric / Minor Patient Management 0 '[]'  - 0 Isolation Patient Management '[]'  - 0 Hearing / Language / Visual special needs '[]'  - 0 Assessment of Community assistance (transportation, D/C planning, etc.) '[]'  - 0 Additional assistance / Altered mentation '[]'  - 0 Support Surface(s) Assessment (bed, cushion, seat, etc.) INTERVENTIONS - Wound Cleansing / Measurement [name] ([id number]) X- 1 5 Simple Wound Cleansing - one wound '[]'  - 0 Complex Wound Cleansing - multiple wounds X- 1 5 Wound Imaging (photographs - any number of wounds) '[]'  - 0 Wound Tracing (instead of photographs) X- 1 5 Simple Wound Measurement - one wound '[]'  - 0 Complex Wound Measurement - multiple  wounds INTERVENTIONS - Wound Dressings X - Small Wound Dressing one or multiple wounds 1 10 '[]'  - 0 Medium Wound Dressing one or multiple wounds '[]'  - 0 Large Wound Dressing  one or multiple wounds '[]'  - 0 Application of Medications - topical '[]'  - 0 Application of Medications - injection INTERVENTIONS - Miscellaneous '[]'  - External ear exam 0 '[]'  - 0 Specimen Collection (cultures, biopsies, blood, body fluids, etc.) '[]'  - 0 Specimen(s) / Culture(s) sent or taken to Lab for analysis '[]'  - 0 Patient Transfer (multiple staff / Civil Service fast streamer / Similar devices) '[]'  - 0 Simple Staple / Suture removal (25 or less) '[]'  - 0 Complex Staple / Suture removal (26 or more) '[]'  - 0 Hypo / Hyperglycemic Management (close monitor of Blood Glucose) '[]'  - 0 Ankle / Brachial Index (ABI) - do not check if billed separately X- 1 5 Vital Signs Has the patient been seen at the hospital within the last three years: Yes Total Score: 75 Level Of Care: New/Established - Level 2 Electronic Signature(s) Signed: 10/27/2020 5:12:38 PM By: [name], BSN, RN, CWS, Kim RN, BSN Entered By: [name], BSN, RN, CWS, Kim on 10/27/2020 15:23:43 [name] ([id number]) -------------------------------------------------------------------------------- [name] Date of Service: 10/27/2020 1:30 PM Medical Record Number: [id number] Patient Account Number: [id number] Date of Birth/Sex: 1960-12-16 (59 y.o. M) [name] Primary Care [name]: PATIENT, NO Other Clinician: Referring [name]: [name] Treating [name] in Treatment: 20 Encounter Discharge Information Items Discharge Condition: Stable Ambulatory Status: Ambulatory Discharge Destination: Home Transportation: Private Auto Accompanied By: self Schedule Follow-up Appointment: Yes Clinical Summary of Care: Electronic Signature(s) Signed: 10/27/2020 4:23:26 PM By:  [name], BSN, RN, CWS, Kim RN, BSN Entered By: [name], BSN, RN, CWS, Kim on 10/27/2020 16:23:26 Esquilin, Raekwan ([id number]) -------------------------------------------------------------------------------- Lower Extremity Assessment Details Patient Name: [name] Date of Service: 10/27/2020 1:30 PM Medical Record Number: [id number] Patient Account Number: [id number] Date of Birth/Sex: 03/17/1961 (59 y.o. M) [name] Primary Care Ariea Rochin: PATIENT, NO Other Clinician: Referring Darriana Deboy: [name] Treating Selig Wampole/Extender: Jeri Cos Weeks in Treatment: 20 Edema Assessment Assessed: [Left: Yes] [Right: No] Edema: [Left: N] [Right: o] Vascular Assessment Pulses: Dorsalis Pedis Palpable: [Left:Yes] Electronic Signature(s) Signed: 10/27/2020 4:35:09 PM By: [name], [name] Entered By: [name], [name] on 10/27/2020 13:25:06 Kocak, Earnestine ([id number]) -------------------------------------------------------------------------------- Multi Wound Chart Details Patient Name: [name] Date of Service: 10/27/2020 1:30 PM Medical Record Number: [id number] Patient Account Number: [id number] Date of Birth/Sex: 1960/12/24 (59 y.o. M) [name] Primary Care Raistlin Gum: PATIENT, NO Other Clinician: Referring Anamari Galeas: [name] Treating Toluwanimi Radebaugh/Extender: [name] in Treatment: 20 Vital Signs Height(in): 72 Pulse(bpm): 78 Weight(lbs): 249 Blood Pressure(mmHg): 125/73 Body Mass Index(BMI): 34 Temperature(F): 98.1 Respiratory Rate(breaths/min): 16 Photos: [N/A:N/A] Wound Location: Left, Anterior Toe Great N/A N/A Wounding Event: Gradually Appeared N/A N/A Primary Etiology: Diabetic Wound/Ulcer of the Lower N/A N/A Extremity Comorbid History: Cataracts, Type II Diabetes, N/A N/A Neuropathy Date Acquired: 05/07/2020 N/A N/A Weeks of Treatment: 20 N/A N/A Wound Status: Open N/A N/A Measurements L x W x D (cm)  0.1x0.1x0.3 N/A N/A Area (cm) : 0.008 N/A N/A Volume (cm) : 0.002 N/A N/A % Reduction in Area: 99.40% N/A N/A % Reduction in Volume: 99.20% N/A N/A Classification: Grade 1 N/A N/A Exudate Amount: Small N/A N/A Exudate Type: Serous N/A N/A Exudate Color: amber N/A N/A Wound Margin: Epibole N/A N/A Granulation Amount: Large (67-100%) N/A N/A Granulation Quality: Pink N/A N/A Necrotic Amount: None Present (0%) N/A N/A Exposed Structures: Fat Layer (Subcutaneous Tissue): N/A N/A Yes Fascia: No Tendon: No Muscle: No Joint: No Bone: No Epithelialization: Large (67-100%) N/A N/A  Treatment Notes Electronic Signature(s) Signed: 10/27/2020 3:22:42 PM By: [name], BSN, RN, CWS, Kim RN, BSN Entered By: [name], BSN, RN, CWS, Kim on 10/27/2020 15:22:42 Vanderloop, Jahaan ([id number]) -------------------------------------------------------------------------------- Multi-Disciplinary Care Plan Details Patient Name: [name] Date of Service: 10/27/2020 1:30 PM Medical Record Number: [id number] Patient Account Number: [id number] Date of Birth/Sex: February 26, 1961 (59 y.o. M) [name] Primary Care Marymargaret Kirker: PATIENT, NO Other Clinician: Referring Vittoria Noreen: [name] Treating Karthik Whittinghill/Extender: [name] in Treatment: 20 Active Inactive Necrotic Tissue Nursing Diagnoses: Impaired tissue integrity related to necrotic/devitalized tissue Goals: Necrotic/devitalized tissue will be minimized in the wound bed Date Initiated: 06/07/2020 Target Resolution Date: 10/20/2020 Goal Status: Active Interventions: Assess patient pain level pre-, during and post procedure and prior to discharge Treatment Activities: Excisional debridement : 06/07/2020 Notes: Wound/Skin Impairment Nursing Diagnoses: Impaired tissue integrity Goals: Patient/caregiver will verbalize understanding of skin care regimen Date Initiated: 06/07/2020 Date Inactivated: 07/12/2020 Target Resolution Date:  07/06/2020 Goal Status: Met Ulcer/skin breakdown will have a volume reduction of 30% by week 4 Date Initiated: 06/07/2020 Date Inactivated: 07/12/2020 Target Resolution Date: 07/06/2020 Goal Status: Met Ulcer/skin breakdown will have a volume reduction of 50% by week 8 Date Initiated: 07/28/2020 Date Inactivated: 09/30/2020 Target Resolution Date: 08/06/2020 Goal Status: Unmet Unmet Reason: DM Ulcer/skin breakdown will have a volume reduction of 80% by week 12 Date Initiated: 09/30/2020 Date Inactivated: 09/30/2020 Target Resolution Date: 09/06/2020 Goal Status: Unmet Unmet Reason: DM Ulcer/skin breakdown will heal within 14 weeks Date Initiated: 09/30/2020 Target Resolution Date: 09/09/2020 Goal Status: Active Interventions: Assess patient/caregiver ability to obtain necessary supplies Assess ulceration(s) every visit Treatment Activities: Referred to DME Kennon Encinas for dressing supplies : 06/07/2020 Notes: Electronic Signature(s) Signed: 10/27/2020 3:22:33 PM By: [name], BSN, RN, CWS, Kim RN, BSN Entered By: [name], BSN, RN, CWS, Kim on 10/27/2020 15:22:33 Jarema, Leondro ([id number]) Cariker, Isiaih ([id number]) -------------------------------------------------------------------------------- Pain Assessment Details Patient Name: [name] Date of Service: 10/27/2020 1:30 PM Medical Record Number: [id number] Patient Account Number: [id number] Date of Birth/Sex: 06/14/1961 (59 y.o. M) [name] Primary Care Bronsyn Shappell: PATIENT, NO Other Clinician: Referring Zahriah Roes: [name] Treating Cesiah Westley/Extender: [name] in Treatment: 20 Active Problems Location of Pain Severity and Description of Pain Patient Has Paino No Site Locations Rate the pain. Current Pain Level: 0 Pain Management and Medication Current Pain Management: Electronic Signature(s) Signed: 10/27/2020 4:35:09 PM By: [name], [name] Entered By: [name], [name] on  10/27/2020 13:18:30 Druckenmiller, Jorgen ([id number]) -------------------------------------------------------------------------------- Patient/Caregiver Education Details Patient Name: [name] Date of Service: 10/27/2020 1:30 PM Medical Record Number: [id number] Patient Account Number: [id number] Date of Birth/Gender: Aug 03, 1961 (59 y.o. M) [name] Primary Care Physician: PATIENT, NO Other Clinician: Referring Physician: Merrie [name] Treating Physician/Extender: [name] in Treatment: 20 Education Assessment Education Provided To: Patient Education Topics Provided Pressure: Handouts: Other: keep pressure off of wounded area Methods: Explain/Verbal Responses: State content correctly Wound/Skin Impairment: Handouts: Other: continue wound care as prescribed Methods: Demonstration Responses: State content correctly Electronic Signature(s) Signed: 10/27/2020 5:12:38 PM By: [name], BSN, RN, CWS, Kim RN, BSN Entered By: [name], BSN, RN, CWS, Kim on 10/27/2020 15:24:40 Bogdanski, Duilio ([id number]) -------------------------------------------------------------------------------- Wound Assessment Details Patient Name: [name] Date of Service: 10/27/2020 1:30 PM Medical Record Number: [id number] Patient Account Number: [id number] Date of Birth/Sex: May 21, 1961 (59 y.o. M) [name] Primary Care Lavina Resor: PATIENT, NO Other Clinician: Referring Lorcan Shelp: [name] Treating Timothy Townsel/Extender: [name] in Treatment: 20 Wound Status Wound Number: 2 Primary Etiology: Diabetic  Wound/Ulcer of the Lower Extremity Wound Location: Left, Anterior Toe Great Wound Status: Open Wounding Event: Gradually Appeared Comorbid History: Cataracts, Type II Diabetes, Neuropathy Date Acquired: 05/07/2020 Weeks Of Treatment: 20 Clustered Wound: No Photos Wound Measurements Length: (cm) 0.1 % Width: (cm) 0.1 % Depth: (cm) 0.3 E Area: (cm)  0.008 Volume: (cm) 0.002 Reduction in Area: 99.4% Reduction in Volume: 99.2% pithelialization: Large (67-100%) Tunneling: No Undermining: No Wound Description Classification: Grade 1 Wound Margin: Epibole Exudate Amount: Small Exudate Type: Serous Exudate Color: amber Foul Odor After Cleansing: No Slough/Fibrino No Wound Bed Granulation Amount: Large (67-100%) Exposed Structure Granulation Quality: Pink Fascia Exposed: No Necrotic Amount: None Present (0%) Fat Layer (Subcutaneous Tissue) Exposed: Yes Tendon Exposed: No Muscle Exposed: No Joint Exposed: No Bone Exposed: No Treatment Notes Wound #2 (Left, Anterior Toe Great) Notes neosporin and coverlet Electronic Signature(s) Signed: 10/27/2020 4:35:09 PM By: Blenda Nicely, Zahid ([id number]) Entered By: [name], [name] on 10/27/2020 13:24:36 Kimberley, Addie ([id number]) -------------------------------------------------------------------------------- Vitals Details Patient Name: [name] Date of Service: 10/27/2020 1:30 PM Medical Record Number: [id number] Patient Account Number: [id number] Date of Birth/Sex: Jun 21, 1961 (59 y.o. M) [name] Primary Care Morena Mckissack: PATIENT, NO Other Clinician: Referring Usiel Astarita: [name] Treating Pammy Vesey/Extender: [name] in Treatment: 20 Vital Signs Time Taken: 13:16 Temperature (F): 98.1 Height (in): 72 Pulse (bpm): 92 Weight (lbs): 249 Respiratory Rate (breaths/min): 16 Body Mass Index (BMI): 33.8 Blood Pressure (mmHg): 125/73 Reference Range: 80 - 120 mg / dl Electronic Signature(s) Signed: 10/27/2020 4:35:09 PM By: [name], [name] Entered By: [name], [name] on 10/27/2020 13:18:13

## 2020-11-01 ENCOUNTER — Encounter: Payer: BC Managed Care – PPO | Admitting: Physician Assistant

## 2020-11-01 ENCOUNTER — Other Ambulatory Visit: Payer: Self-pay

## 2020-11-01 DIAGNOSIS — E1142 Type 2 diabetes mellitus with diabetic polyneuropathy: Secondary | ICD-10-CM | POA: Diagnosis not present

## 2020-11-01 DIAGNOSIS — E1140 Type 2 diabetes mellitus with diabetic neuropathy, unspecified: Secondary | ICD-10-CM | POA: Diagnosis not present

## 2020-11-01 DIAGNOSIS — E11621 Type 2 diabetes mellitus with foot ulcer: Secondary | ICD-10-CM | POA: Diagnosis not present

## 2020-11-01 DIAGNOSIS — L97412 Non-pressure chronic ulcer of right heel and midfoot with fat layer exposed: Secondary | ICD-10-CM | POA: Diagnosis not present

## 2020-11-01 DIAGNOSIS — L97511 Non-pressure chronic ulcer of other part of right foot limited to breakdown of skin: Secondary | ICD-10-CM | POA: Diagnosis not present

## 2020-11-01 DIAGNOSIS — L97512 Non-pressure chronic ulcer of other part of right foot with fat layer exposed: Secondary | ICD-10-CM | POA: Diagnosis not present

## 2020-11-01 DIAGNOSIS — L97522 Non-pressure chronic ulcer of other part of left foot with fat layer exposed: Secondary | ICD-10-CM | POA: Diagnosis not present

## 2020-11-01 NOTE — Progress Notes (Signed)
TAIJUAN, SERVISS ([id number]) Visit Report for 11/01/2020 Arrival Information Details Patient Name: DERREL, MOORE Date of Service: 11/01/2020 2:15 PM Medical Record Number: [id number] Patient Account Number: [id number] Date of Birth/Sex: 1961/07/05 (59 y.o. M) Treating RN: Rogers Blocker Primary Care Spike Desilets: PATIENT, NO Other Clinician: Referring Axel Frisk: Roosvelt Maser Treating Kathi Dohn/Extender: Rowan Blase in Treatment: 21 Visit Information History Since Last Visit Pain Present Now: No Patient Arrived: Cane Arrival Time: 14:32 Accompanied By: self Transfer Assistance: None Patient Identification Verified: Yes Secondary Verification Process Completed: Yes Patient Requires Transmission-Based Precautions: No Patient Has Alerts: Yes Patient Alerts: Type II Diabetic Electronic Signature(s) Signed: 11/01/2020 4:20:00 PM By: Phillis Haggis, Dondra Prader Entered By: Phillis Haggis, Dondra Prader on 11/01/2020 14:32:48 Toso, Xavi ([id number]) -------------------------------------------------------------------------------- Encounter Discharge Information Details Patient Name: [name] Date of Service: 11/01/2020 2:15 PM Medical Record Number: [id number] Patient Account Number: [id number] Date of Birth/Sex: [birth year] (59 y.o. M) Treating RN: Yevonne Pax Primary Care Ruairi Stutsman: PATIENT, NO Other Clinician: Referring Gustav Knueppel: Roosvelt Maser Treating Oskar Cretella/Extender: Rowan Blase in Treatment: 21 Encounter Discharge Information Items Post Procedure Vitals Discharge Condition: Stable Temperature (F): 98.4 Ambulatory Status: Cane Pulse (bpm): 94 Discharge Destination: Home Respiratory Rate (breaths/min): 16 Transportation: Private Auto Blood Pressure (mmHg): 132/77 Accompanied By: self Schedule Follow-up Appointment: Yes Clinical Summary of Care: Patient Declined Electronic Signature(s) Signed: 11/01/2020 5:17:28 PM By: Yevonne Pax RN Entered By: Yevonne Pax on  11/01/2020 15:21:50 Latterell, Fergus ([id number]) -------------------------------------------------------------------------------- Lower Extremity Assessment Details Patient Name: [name] Date of Service: 11/01/2020 2:15 PM Medical Record Number: [id number] Patient Account Number: [id number] Date of Birth/Sex: 01/27/1961 (59 y.o. M) Treating RN: Rogers Blocker Primary Care Amariona Rathje: PATIENT, NO Other Clinician: Referring Shia Eber: Roosvelt Maser Treating Branae Crail/Extender: Allen Derry Weeks in Treatment: 21 Edema Assessment Assessed: [Left: Yes] [Right: No] Edema: [Left: N] [Right: o] Vascular Assessment Pulses: Dorsalis Pedis Palpable: [Left:Yes] Electronic Signature(s) Signed: 11/01/2020 4:20:00 PM By: Phillis Haggis, Dondra Prader Entered By: Phillis Haggis, Dondra Prader on 11/01/2020 14:38:15 Thul, Antwoine ([id number]) -------------------------------------------------------------------------------- Multi Wound Chart Details Patient Name: [name] Date of Service: 11/01/2020 2:15 PM Medical Record Number: [id number] Patient Account Number: [id number] Date of Birth/Sex: 1961-07-14 (59 y.o. M) Treating RN: Huel Coventry Primary Care Zakariah Dejarnette: PATIENT, NO Other Clinician: Referring Saki Legore: Roosvelt Maser Treating Roni Scow/Extender: Rowan Blase in Treatment: 21 Vital Signs Height(in): 72 Pulse(bpm): 94 Weight(lbs): 249 Blood Pressure(mmHg): 132/77 Body Mass Index(BMI): 34 Temperature(F): 98.4 Respiratory Rate(breaths/min): 16 Photos: [N/A:N/A] Wound Location: Left, Anterior Toe Great N/A N/A Wounding Event: Gradually Appeared N/A N/A Primary Etiology: Diabetic Wound/Ulcer of the Lower N/A N/A Extremity Comorbid History: Cataracts, Type II Diabetes, N/A N/A Neuropathy Date Acquired: 05/07/2020 N/A N/A Weeks of Treatment: 21 N/A N/A Wound Status: Open N/A N/A Measurements L x W x D (cm) 0.1x0.1x0.3 N/A N/A Area (cm) : 0.008 N/A N/A Volume (cm) : 0.002 N/A N/A %  Reduction in Area: 99.40% N/A N/A % Reduction in Volume: 99.20% N/A N/A Classification: Grade 1 N/A N/A Exudate Amount: Small N/A N/A Exudate Type: Serous N/A N/A Exudate Color: amber N/A N/A Wound Margin: Epibole N/A N/A Granulation Amount: Large (67-100%) N/A N/A Granulation Quality: Pink N/A N/A Necrotic Amount: None Present (0%) N/A N/A Exposed Structures: Fat Layer (Subcutaneous Tissue): N/A N/A Yes Fascia: No Tendon: No Muscle: No Joint: No Bone: No Epithelialization: Large (67-100%) N/A N/A Treatment Notes Electronic Signature(s) Signed: 11/01/2020 5:19:05 PM By: Elliot Gurney, BSN, RN, CWS, Kim RN, BSN Entered By: Elliot Gurney, BSN, RN, CWS, Kim on 11/01/2020 15:05:38 Meece, Ghazi ([id number]) -------------------------------------------------------------------------------- Multi-Disciplinary Care Plan Details Patient  Name: ANDRY, BOGDEN Date of Service: 11/01/2020 2:15 PM Medical Record Number: [id number] Patient Account Number: [id number] Date of Birth/Sex: 1961-04-19 (59 y.o. M) Treating RN: Huel Coventry Primary Care Jahking Lesser: PATIENT, NO Other Clinician: Referring Eira Alpert: Roosvelt Maser Treating Pravin Perezperez/Extender: Rowan Blase in Treatment: 21 Active Inactive Necrotic Tissue Nursing Diagnoses: Impaired tissue integrity related to necrotic/devitalized tissue Goals: Necrotic/devitalized tissue will be minimized in the wound bed Date Initiated: 06/07/2020 Target Resolution Date: 11/03/2020 Goal Status: Active Interventions: Assess patient pain level pre-, during and post procedure and prior to discharge Treatment Activities: Excisional debridement : 06/07/2020 Notes: Electronic Signature(s) Signed: 11/01/2020 5:19:05 PM By: Elliot Gurney, BSN, RN, CWS, Kim RN, BSN Entered By: Elliot Gurney, BSN, RN, CWS, Kim on 11/01/2020 15:05:10 Lavalle, Terique ([id number]) -------------------------------------------------------------------------------- Pain Assessment Details Patient Name: [name] Date of Service: 11/01/2020 2:15 PM Medical Record Number: [id number] Patient Account Number: [id number] Date of Birth/Sex: 1961-10-08 (59 y.o. M) Treating RN: Rogers Blocker Primary Care Julieana Eshleman: PATIENT, NO Other Clinician: Referring Valta Dillon: Roosvelt Maser Treating Zain Bingman/Extender: Rowan Blase in Treatment: 21 Active Problems Location of Pain Severity and Description of Pain Patient Has Paino No Site Locations Rate the pain. Current Pain Level: 0 Pain Management and Medication Current Pain Management: Electronic Signature(s) Signed: 11/01/2020 4:20:00 PM By: Phillis Haggis, Dondra Prader Entered By: Phillis Haggis, Dondra Prader on 11/01/2020 14:36:00 Weld, Taurus ([id number]) -------------------------------------------------------------------------------- Patient/Caregiver Education Details Patient Name: [name] Date of Service: 11/01/2020 2:15 PM Medical Record Number: [id number] Patient Account Number: [id number] Date of Birth/Gender: 01/25/1961 (59 y.o. M) Treating RN: Huel Coventry Primary Care Physician: PATIENT, NO Other Clinician: Referring Physician: Roosvelt Maser Treating Physician/Extender: Rowan Blase in Treatment: 21 Education Assessment Education Provided To: Patient Education Topics Provided Wound Debridement: Handouts: Wound Debridement Methods: Demonstration, Explain/Verbal Responses: State content correctly Wound/Skin Impairment: Handouts: Caring for Your Ulcer Methods: Demonstration, Explain/Verbal Responses: State content correctly Electronic Signature(s) Signed: 11/01/2020 5:19:05 PM By: Elliot Gurney, BSN, RN, CWS, Kim RN, BSN Entered By: Elliot Gurney, BSN, RN, CWS, Kim on 11/01/2020 15:09:25 Lana, Rizwan ([id number]) -------------------------------------------------------------------------------- Wound Assessment Details Patient Name: [name] Date of Service: 11/01/2020 2:15 PM Medical Record Number: [id number] Patient Account Number:  [id number] Date of Birth/Sex: Nov 08, 1961 (59 y.o. M) Treating RN: Rogers Blocker Primary Care Detrice Cales: PATIENT, NO Other Clinician: Referring Letzy Gullickson: Roosvelt Maser Treating Jamaia Brum/Extender: Rowan Blase in Treatment: 21 Wound Status Wound Number: 2 Primary Etiology: Diabetic Wound/Ulcer of the Lower Extremity Wound Location: Left, Anterior Toe Great Wound Status: Open Wounding Event: Gradually Appeared Comorbid History: Cataracts, Type II Diabetes, Neuropathy Date Acquired: 05/07/2020 Weeks Of Treatment: 21 Clustered Wound: No Photos Wound Measurements Length: (cm) 0.1 % Width: (cm) 0.1 % Depth: (cm) 0.3 Ep Area: (cm) 0.008 T Volume: (cm) 0.002 U Reduction in Area: 99.4% Reduction in Volume: 99.2% ithelialization: Large (67-100%) unneling: No ndermining: No Wound Description Classification: Grade 1 F Wound Margin: Epibole S Exudate Amount: Small Exudate Type: Serous Exudate Color: amber oul Odor After Cleansing: No lough/Fibrino No Wound Bed Granulation Amount: Large (67-100%) Exposed Structure Granulation Quality: Pink Fascia Exposed: No Necrotic Amount: None Present (0%) Fat Layer (Subcutaneous Tissue) Exposed: Yes Tendon Exposed: No Muscle Exposed: No Joint Exposed: No Bone Exposed: No Treatment Notes Wound #2 (Left, Anterior Toe Great) 1. Cleansed with: Clean wound with Normal Saline Notes prisma moistened with normal saline and coverlet Fanning, Oley ([id number]) Electronic Signature(s) Signed: 11/01/2020 4:20:00 PM By: Phillis Haggis, Dondra Prader Entered By: Phillis Haggis, Dondra Prader on 11/01/2020 14:37:13 Plummer, Parth ([id number]) -------------------------------------------------------------------------------- Vitals Details Patient Name: Ranell Patrick, Jahaad Date of  Service: 11/01/2020 2:15 PM Medical Record Number: [id number] Patient Account Number: [id number] Date of Birth/Sex: Apr 22, 1961 (59 y.o. M) Treating RN: Rogers Blocker Primary Care  Donnia Poplaski: PATIENT, NO Other Clinician: Referring Zoltan Genest: Roosvelt Maser Treating Chakira Jachim/Extender: Rowan Blase in Treatment: 21 Vital Signs Time Taken: 14:32 Temperature (F): 98.4 Height (in): 72 Pulse (bpm): 94 Weight (lbs): 249 Respiratory Rate (breaths/min): 16 Body Mass Index (BMI): 33.8 Blood Pressure (mmHg): 132/77 Reference Range: 80 - 120 mg / dl Electronic Signature(s) Signed: 11/01/2020 4:20:00 PM By: Phillis Haggis, Dondra Prader Entered By: Phillis Haggis, Dondra Prader on 11/01/2020 14:33:06

## 2020-11-01 NOTE — Progress Notes (Addendum)
[name] [name] ([id number]) Visit Report for 11/01/2020 Chief Complaint Document Details Patient Name: [name] [name] Date of Service: 11/01/2020 2:15 PM Medical Record Number: [id number] Patient Account Number: [id number] Date of Birth/Sex: [birth year] (59 y.o. M) Treating RN: Huel Coventry Primary Care Provider: PATIENT, NO Other Clinician: Referring Provider: Roosvelt Maser Treating Provider/Extender: Rowan Blase in Treatment: 21 Information Obtained from: Patient Chief Complaint Left foot/toe ulcers Electronic Signature(s) Signed: 11/01/2020 2:58:49 PM By: Lenda Kelp PA-C Entered By: Lenda Kelp on 11/01/2020 14:58:48 [name] [name], [name] ([id number]) -------------------------------------------------------------------------------- Debridement Details Patient Name: [name] [name] Date of Service: 11/01/2020 2:15 PM Medical Record Number: [id number] Patient Account Number: [id number] Date of Birth/Sex: [birth year] (59 y.o. M) Treating RN: Huel Coventry Primary Care Provider: PATIENT, NO Other Clinician: Referring Provider: Roosvelt Maser Treating Provider/Extender: Rowan Blase in Treatment: 21 Debridement Performed for Wound #2 Left,Anterior Toe Great Assessment: Performed By: Physician Nelida Meuse., PA-C Debridement Type: Debridement Severity of Tissue Pre Debridement: Limited to breakdown of skin Level of Consciousness (Pre- Awake and Alert procedure): Pre-procedure Verification/Time Out Yes - 15:00 Taken: Total Area Debrided (L x W): 0.4 (cm) x 0.5 (cm) = 0.2 (cm) Tissue and other material Non-Viable, Callus debrided: Level: Non-Viable Tissue Debridement Description: Selective/Open Wound Instrument: Curette Bleeding: None Response to Treatment: Procedure was tolerated well Level of Consciousness (Post- Awake and Alert procedure): Post Debridement Measurements of Total Wound Length: (cm) 0.4 Width: (cm) 0.5 Depth: (cm) 0.3 Volume: (cm) 0.047 Character  of Wound/Ulcer Post Debridement: Stable Severity of Tissue Post Debridement: Limited to breakdown of skin Post Procedure Diagnosis Same as Pre-procedure Electronic Signature(s) Signed: 11/01/2020 5:19:05 PM By: Elliot Gurney, BSN, RN, CWS, Kim RN, BSN Signed: 11/01/2020 5:21:29 PM By: Lenda Kelp PA-C Entered By: Elliot Gurney, BSN, RN, CWS, Kim on 11/01/2020 15:07:22 [name] [name], [name] ([id number]) -------------------------------------------------------------------------------- HPI Details Patient Name: [name] [name] Date of Service: 11/01/2020 2:15 PM Medical Record Number: [id number] Patient Account Number: [id number] Date of Birth/Sex: Dec 31, 1960 (59 y.o. M) Treating RN: Huel Coventry Primary Care Provider: PATIENT, NO Other Clinician: Referring Provider: Roosvelt Maser Treating Provider/Extender: Rowan Blase in Treatment: 21 History of Present Illness HPI Description: 06/10/2020 upon evaluation today patient appears to be doing poorly in regard to his right plantar foot, left great toe, and left second toe. Unfortunately he has significant wounds at these locations but especially in regard to the second toe which does have a lot of necrotic tissue over the distal portion of the toe. The great toe does have some necrotic tissue as well. With that being said the patient tells me that this has been going on for 1-2 months and seems to be getting worse especially in regard to the toe. He has not had any specific x-rays to identify obvious signs of osteomyelitis up to this point that something would likely get a different today as well. With that being said he also has not had any current arterial studies which I think is something else that we probably will look into performing at this point. He does have a history of diabetes mellitus type 2, and diabetic neuropathy. Currently he has been using normal shoes for ambulation though he does have some kind of external device so that he does not have to have  actual still toed boots anymore at work which he states has helped nonetheless we is at work he does have to have something that protects his feet as such per policy. He tells me he is having to do a lot more walking right  now compared to normal but he can try to see if he can change something in that regard. 06/16/2020 upon evaluation today patient appears to be doing well all things considered with regard to his wounds. I do not feel like anything is it any worse and overall I feel like the Bactrim has done well. The culture that I reviewed did not reveal any specific organisms unfortunately that would help tailor treatment. I did actually review the x-ray as well it does appear that he has evidence of osteomyelitis of the distal tuft of the second toe left foot. That was discussed with him today. He likely will need a referral to infectious disease. 06/30/2020 on evaluation today patient appears to be doing well at this time in regard to his wounds all things considered. The erythema has improved he does have evidence of osteomyelitis again the second toe on the left foot but at the other sites there were no obvious signs. He does have wounds bilaterally on his feet and toes. These are to require sharp debridement today. He is still pending as far as his appointment with infectious disease. With that being said this is actually scheduled for 29 July which is this month. He also has an appointment on the 27th with vascular. Overall at least we are getting to the point where I think we are getting something is taken care of here. 07/12/2020 patient did have his appointment with [facility] infectious disease. They have placed him on medications which include Flagyl 500 mg and Levaquin 750 mg for the next several weeks until September 9. Other than that he overall seems to be looking quite well and I am very pleased with how things are progressing at this point. 07/21/2020 on evaluation today patient appears to be  doing well with regard to his wounds. He is on Flagyl as well as Levaquin which is helping to take care of the infection it appears. Fortunately there is no signs of active infection systemically at this time which is also good news. I am very pleased with where things stand. 07/28/20 on evaluation today patient appears to be doing well with regard to his wounds. He has been tolerating the dressing changes without complication we've been using the alginate currently. With that being said I do feel like the patient is making good progress albeit slow. 08/04/2020 on evaluation today patient's wound actually is showing signs of good improvement which is great news and overall very pleased with where things stand. Especially in regard to the right foot. The left great toe unfortunately is still having a lot of drainage and is somewhat macerated. This actually does appear to go deeper and is actually showing signs of bone exposure in the base of the wound. Again he is on antibiotics and I think we could consider HBO therapy in fact this is something I may discuss with him next week depending on how the toe looks. With that being said in the short-term I think I am going to actually recommend that he change the dressing more frequently right has been doing every other day 08/16/2020 on evaluation today patient appears to be doing well with regard to his ulcers. He is going require some debridement of both sites remaining he still is having a lot of drainage from the toe but again this seems to be making some progress here just very slowly. I did discuss with him the possibility of hyperbarics today but he really was not interested in that at this  point. 08/25/2020 upon evaluation today patient's wounds appear to be doing okay at this point. He is progressing quite slowly at this time but nonetheless does seem to be making fairly good progress at this time which is good news. Fortunately there is no signs of  active infection at this time. He does have an appointment with his infectious disease doctor next week on Thursday therefore he will not be able to make the appointment here we will likely see him in 2 weeks in that regard. 09/09/2020 upon evaluation today patient appears to be doing well currently in regard to his wounds. He still has openings at both locations although the great toe on the left does appear to be a little smaller to me which is good news. Fortunately there is no signs of active infection at this time which is excellent. 09/30/2020 on evaluation today patient appears to be doing well with regard to his left great toe ulcer this is measuring a little smaller. In regard to the right foot plantar unfortunately this is not really significantly improved. I think the biggest issue here is he continues to wear his work shoes and walks sufficiently on this to because it did not really be able to close as effectively as it needs to. We previously discussed total contact casting but the patient states he cannot do this and work and he also can do this and drive which is essential to him. He tells me that he does not really have any time off and he is very concerned about the fact that he has to continue to work. Again this is a discussion we have had multiple times in the past as well. 10/13/2020 on evaluation today patient appears to be doing decently well in regard to his left great toe ulcer this is not as macerated mainly likely due to the fact that he is not working currently in his work boots for a large portion of his day. 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 He ended up going to urgent care subsequently to the hospital and has since had the amputation. I did not view that today as it is wrapped up and he recently just saw his podiatrist in that regard. Fortunately there is no signs of active  infection at this time which is great news. [facility], [name] ([id number]) 10/20/2020 upon evaluation today patient appears to be doing actually very well in regard to his toe ulcer. He has been tolerating the dressing changes without complication. Fortunately there is no evidence of active infection which is great news and in general I been very pleased with how things seem to be progressing. No fevers, chills, nausea, vomiting, or diarrhea. 10/27/2020 on evaluation today patient's wound actually showing signs of good improvement. Fortunately there is no signs of active infection at this time. There does not appear to be any evidence of drainage significantly and I think that the biggest issue he is having is keeping his Band- Aid in place so that the wound does not dry out. Fortunately this is a better problem to have than it staying too wet like it was previous. I do believe we can manage this. 07/01/2020 upon evaluation today patient appears to be doing about the same in regard to his toe ulcer. I feel like this is still not really showing signs of the improvement that I would expect. I am the like to remove  some of the callus around the edges of the wound today is much as I can. Electronic Signature(s) Signed: 11/01/2020 3:32:08 PM By: Lenda Kelp PA-C Entered By: Lenda Kelp on 11/01/2020 15:32:08 [name] [name], [name] ([id number]) -------------------------------------------------------------------------------- Physical Exam Details Patient Name: [name] [name] Date of Service: 11/01/2020 2:15 PM Medical Record Number: [id number] Patient Account Number: [id number] Date of Birth/Sex: Apr 27, 1961 (59 y.o. M) Treating RN: Huel Coventry Primary Care Provider: PATIENT, NO Other Clinician: Referring Provider: Roosvelt Maser Treating Provider/Extender: Rowan Blase in Treatment: 21 Constitutional Well-nourished and well-hydrated in no acute distress. Respiratory normal breathing without  difficulty. Psychiatric this patient is able to make decisions and demonstrates good insight into disease process. Alert and Oriented x 3. pleasant and cooperative. Notes Upon inspection patient's wound bed actually showed signs of some callus around the edges of the wound I did perform sharp debridement to carefully clean this away no bleeding was noted with the procedure. I did realize that the edges of the wound were not really attached to the edges of the skin and I think this is what is causing the issue. I got all this removed subsequently though there was no bleeding I did note that probing down deeper into the wound was possible. I think that we need to go back to the collagen try to see if we can help stimulate some additional growth here. I think that the patient is in agreement with that plan and will see how things do over the next couple of weeks. Electronic Signature(s) Signed: 11/01/2020 3:32:50 PM By: Lenda Kelp PA-C Entered By: Lenda Kelp on 11/01/2020 15:32:50 [name] [name], [name] ([id number]) -------------------------------------------------------------------------------- Physician Orders Details Patient Name: [name] [name] Date of Service: 11/01/2020 2:15 PM Medical Record Number: [id number] Patient Account Number: [id number] Date of Birth/Sex: 1961/12/10 (59 y.o. 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 Anesthetic (add to Medication List) Wound #2 Left,Anterior Toe Great o Topical Lidocaine 4% cream applied to wound bed prior to debridement (In Clinic Only). Primary Wound Dressing Wound #2 Left,Anterior Toe Great o Silver Collagen Secondary Dressing Wound #2 Left,Anterior Toe Great o Other - coverlet Dressing Change Frequency Wound #2 Left,Anterior Toe Great o Change dressing every day. Follow-up Appointments Wound #2 Left,Anterior Toe Great o Return Appointment in 2 weeks. Edema Control Wound #2 Left,Anterior Toe Great o Elevate legs to the level of the heart and pump ankles as often as possible Additional Orders / Instructions Wound #2 Left,Anterior Toe Great o Increase protein intake. Electronic Signature(s) Signed: 11/01/2020 5:19:05 PM By: Elliot Gurney, BSN, RN, CWS, Kim RN, BSN Signed: 11/01/2020 5:21:29 PM By: Lenda Kelp PA-C Entered By: Elliot Gurney BSN, RN, CWS, Kim on 11/01/2020 15:08:43 [name] [name], [name] ([id number]) -------------------------------------------------------------------------------- Problem List Details Patient Name: [name] [name] Date of Service: 11/01/2020 2:15 PM Medical Record Number: [id number] Patient Account Number: [id number] Date of Birth/Sex: Jun 03, 1961 (59 y.o. M) Treating RN: Huel Coventry Primary Care Provider: PATIENT, NO Other Clinician: Referring Provider: Roosvelt Maser Treating Provider/Extender: Rowan Blase in Treatment:  21 Active Problems ICD-10 Encounter Code Description Active Date MDM Diagnosis E11.621 Type 2 diabetes mellitus with foot ulcer 06/07/2020 No Yes L97.522 Non-pressure chronic ulcer of other part of left foot with fat layer 06/07/2020 No Yes exposed L97.512 Non-pressure chronic ulcer of other part of right foot with fat layer 06/07/2020 No Yes exposed E11.40 Type 2 diabetes mellitus with diabetic neuropathy, unspecified 06/07/2020 No Yes Inactive Problems Resolved Problems Electronic  Signature(s) Signed: 11/01/2020 2:58:20 PM By: Lenda Kelp PA-C Entered By: Lenda Kelp on 11/01/2020 14:58:20 [name] [name], [name] ([id number]) -------------------------------------------------------------------------------- Progress Note Details Patient Name: [name] [name] Date of Service: 11/01/2020 2:15 PM Medical Record Number: [id number] Patient Account Number: [id number] Date of Birth/Sex: May 19, 1961 (59 y.o. M) Treating RN: Huel Coventry Primary Care Provider: PATIENT, NO Other Clinician: Referring Provider: Roosvelt Maser Treating Provider/Extender: Rowan Blase in Treatment: 21 Subjective Chief Complaint Information obtained from Patient Left foot/toe ulcers History of Present Illness (HPI) 06/10/2020 upon evaluation today patient appears to be doing poorly in regard to his right plantar foot, left great toe, and left second toe. Unfortunately he has significant wounds at these locations but especially in regard to the second toe which does have a lot of necrotic tissue over the distal portion of the toe. The great toe does have some necrotic tissue as well. With that being said the patient tells me that this has been going on for 1-2 months and seems to be getting worse especially in regard to the toe. He has not had any specific x-rays to identify obvious signs of osteomyelitis up to this point that something would likely get a different today as well. With that being said he also has not had any current arterial studies which I think is something else that we probably will look into performing at this point. He does have a history of diabetes mellitus type 2, and diabetic neuropathy. Currently he has been using normal shoes for ambulation though he does have some kind of external device so that he does not have to have actual still toed boots anymore at work which he states has helped nonetheless we is at work he does have to have something that protects his feet as such per  policy. He tells me he is having to do a lot more walking right now compared to normal but he can try to see if he can change something in that regard. 06/16/2020 upon evaluation today patient appears to be doing well all things considered with regard to his wounds. I do not feel like anything is it any worse and overall I feel like the Bactrim has done well. The culture that I reviewed did not reveal any specific organisms unfortunately that would help tailor treatment. I did actually review the x-ray as well it does appear that he has evidence of osteomyelitis of the distal tuft of the second toe left foot. That was discussed with him today. He likely will need a referral to infectious disease. 06/30/2020 on evaluation today patient appears to be doing well at this time in regard to his wounds all things considered. The erythema has improved he does have evidence of osteomyelitis again the second toe on the left foot but at the other sites there were no obvious signs. He does have wounds bilaterally on his feet and toes. These are to require sharp debridement today. He is still pending as far as his appointment with infectious disease. With that being said this is actually scheduled for 29 July  which is this month. He also has an appointment on the 27th with vascular. Overall at least we are getting to the point where I think we are getting something is taken care of here. 07/12/2020 patient did have his appointment with [facility] infectious disease. They have placed him on medications which include Flagyl 500 mg and Levaquin 750 mg for the next several weeks until September 9. Other than that he overall seems to be looking quite well and I am very pleased with how things are progressing at this point. 07/21/2020 on evaluation today patient appears to be doing well with regard to his wounds. He is on Flagyl as well as Levaquin which is helping to take care of the infection it appears. Fortunately there is no  signs of active infection systemically at this time which is also good news. I am very pleased with where things stand. 07/28/20 on evaluation today patient appears to be doing well with regard to his wounds. He has been tolerating the dressing changes without complication we've been using the alginate currently. With that being said I do feel like the patient is making good progress albeit slow. 08/04/2020 on evaluation today patient's wound actually is showing signs of good improvement which is great news and overall very pleased with where things stand. Especially in regard to the right foot. The left great toe unfortunately is still having a lot of drainage and is somewhat macerated. This actually does appear to go deeper and is actually showing signs of bone exposure in the base of the wound. Again he is on antibiotics and I think we could consider HBO therapy in fact this is something I may discuss with him next week depending on how the toe looks. With that being said in the short-term I think I am going to actually recommend that he change the dressing more frequently right has been doing every other day 08/16/2020 on evaluation today patient appears to be doing well with regard to his ulcers. He is going require some debridement of both sites remaining he still is having a lot of drainage from the toe but again this seems to be making some progress here just very slowly. I did discuss with him the possibility of hyperbarics today but he really was not interested in that at this point. 08/25/2020 upon evaluation today patient's wounds appear to be doing okay at this point. He is progressing quite slowly at this time but nonetheless does seem to be making fairly good progress at this time which is good news. Fortunately there is no signs of active infection at this time. He does have an appointment with his infectious disease doctor next week on Thursday therefore he will not be able to make  the appointment here we will likely see him in 2 weeks in that regard. 09/09/2020 upon evaluation today patient appears to be doing well currently in regard to his wounds. He still has openings at both locations although the great toe on the left does appear to be a little smaller to me which is good news. Fortunately there is no signs of active infection at this time which is excellent. 09/30/2020 on evaluation today patient appears to be doing well with regard to his left great toe ulcer this is measuring a little smaller. In regard to the right foot plantar unfortunately this is not really significantly improved. I think the biggest issue here is he continues to wear his work shoes and walks sufficiently on this to because  it did not really be able to close as effectively as it needs to. We previously discussed total contact casting but the patient states he cannot do this and work and he also can do this and drive which is essential to him. He tells me that he does not really have any time off and he is very concerned about the fact that he has to continue to work. Again this is a discussion we have had multiple times in the past as well. [name] [name], [name] ([id number]) 10/13/2020 on evaluation today patient appears to be doing decently well in regard to his left great toe ulcer this is not as macerated mainly likely due to the fact that he is not working currently in his work boots for a large portion of his day. 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 He ended up going to urgent care subsequently to the hospital and has since had the amputation. I did not view that today as it is wrapped up and he recently just saw his podiatrist in that regard. Fortunately there is no signs of active infection at this time which is great news. 10/20/2020 upon evaluation today patient appears to be doing actually very well in  regard to his toe ulcer. He has been tolerating the dressing changes without complication. Fortunately there is no evidence of active infection which is great news and in general I been very pleased with how things seem to be progressing. No fevers, chills, nausea, vomiting, or diarrhea. 10/27/2020 on evaluation today patient's wound actually showing signs of good improvement. Fortunately there is no signs of active infection at this time. There does not appear to be any evidence of drainage significantly and I think that the biggest issue he is having is keeping his Band- Aid in place so that the wound does not dry out. Fortunately this is a better problem to have than it staying too wet like it was previous. I do believe we can manage this. 07/01/2020 upon evaluation today patient appears to be doing about the same in regard to his toe ulcer. I feel like this is still not really showing signs of the improvement that I would expect. I am the like to remove some of the callus around the edges of the wound today is much as I can. Objective Constitutional Well-nourished and well-hydrated in no acute distress. Vitals Time Taken: 2:32 PM, Height: 72 in, Weight: 249 lbs, BMI: 33.8, Temperature: 98.4 F, Pulse: 94 bpm, Respiratory Rate: 16 breaths/min, Blood Pressure: 132/77 mmHg. Respiratory normal breathing without difficulty. Psychiatric this patient is able to make decisions and demonstrates good insight into disease process. Alert and Oriented x 3. pleasant and cooperative. General Notes: Upon inspection patient's wound bed actually showed signs of some callus around the edges of the wound I did perform sharp debridement to carefully clean this away no bleeding was noted with the procedure. I did realize that the edges of the wound were not really attached to the edges of the skin and I think this is what is causing the issue. I got all this removed subsequently though there was no bleeding I did  note that probing down deeper into the wound was possible. I think that we need to go back to the collagen try to see if we can help stimulate some additional growth here. I think that the patient is in agreement with that plan and  will see how things do over the next couple of weeks. Integumentary (Hair, Skin) Wound #2 status is Open. Original cause of wound was Gradually Appeared. The wound is located on the Atmos EnergyLeft,Anterior Toe Great. The wound measures 0.1cm length x 0.1cm width x 0.3cm depth; 0.008cm^2 area and 0.002cm^3 volume. There is Fat Layer (Subcutaneous Tissue) exposed. There is no tunneling or undermining noted. There is a small amount of serous drainage noted. The wound margin is epibole. There is large (67-100%) pink granulation within the wound bed. There is no necrotic tissue within the wound bed. 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 There was a Selective/Open Wound Non-Viable Tissue Debridement with a total area of 0.2 sq cm performed by Nelida MeuseStone, Usiel Astarita E., PA-C. With the following instrument(s): Curette to remove Non-Viable tissue/material. Material removed includes Callus. No specimens were taken. A time out was conducted at 15:00, prior to the start of the procedure. There was no bleeding. The procedure was tolerated well. Post Debridement Measurements: 0.4cm length x 0.5cm width x 0.3cm depth; 0.047cm^3 volume. Character of Wound/Ulcer Post Debridement is stable. Severity of Tissue Post  Debridement is: Limited to breakdown of skin. Post procedure Diagnosis Wound #2: Same as Pre-Procedure Plan Wound Cleansing: Wound #2 Left,Anterior Toe Great: Clean wound with Normal Saline. Anesthetic (add to Medication List): Wound #2 Left,Anterior Toe Great: Topical Lidocaine 4% cream applied to wound bed prior to debridement (In Clinic Only). Primary Wound Dressing: Wound #2 Left,Anterior Toe Great: Silver Collagen Secondary Dressing: Wound #2 Left,Anterior Toe Great: Other - coverlet Dressing Change Frequency: Wound #2 Left,Anterior Toe Great: Change dressing every day. Follow-up Appointments: Wound #2 Left,Anterior Toe Great: Return Appointment in 2 weeks. Edema Control: Wound #2 Left,Anterior Toe Great: Elevate legs to the level of the heart and pump ankles as often as possible Additional Orders / Instructions: Wound #2 Left,Anterior Toe Great: Increase protein intake. 1. Again I did not fully realize the extent of the depth of the wound I was trying to allow it to do it seem to be wanting to do which was cover over but again I think that the edges of the wound were really attached it was more callus and somewhat deceiving. After removing that today the patient has an obvious wound but I think he can get collagen down into I think this is good news and we will see how things do over the next week. 2. I would recommend as well we continue to monitor for any signs of infection I do not see any major issues at this point. 3. I am also can recommend that he continue to take it easy on his foot he still carefully managing his right foot where he had surgery obviously I think that this toe is benefiting from this as well he is still out of work at this point. We will see patient back for reevaluation in 2 weeks here in the clinic. If anything worsens or changes patient will contact our office for additional recommendations. Electronic Signature(s) Signed: 11/01/2020 3:34:04 PM  By: Lenda KelpStone III, Jacobi Ryant PA-C Previous Signature:  11/01/2020 3:33:49 PM Version By: Lenda Kelp PA-C Entered By: Lenda Kelp on 11/01/2020 15:34:03 Quilling, Coyt ([id number]) -------------------------------------------------------------------------------- SuperBill Details Patient Name: [name] [name] Date of Service: 11/01/2020 Medical Record Number: [id number] Patient Account Number: [id number] Date of Birth/Sex: [birth year] (59 y.o. M) Treating RN: Huel Coventry Primary Care Provider: PATIENT, NO Other Clinician: Referring Provider: Roosvelt Maser Treating Provider/Extender: Rowan Blase in Treatment: 21 Diagnosis Coding ICD-10 Codes Code Description E11.621 Type 2 diabetes mellitus with foot ulcer 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.512 Non-pressure chronic ulcer of other part of right foot with fat layer exposed E11.40 Type 2 diabetes mellitus with diabetic neuropathy, unspecified Facility Procedures CPT4 Code: [id number] Description: [phone] - DEBRIDE WOUND 1ST 20 SQ CM OR < Modifier: Quantity: 1 CPT4 Code: Description: ICD-10 Diagnosis Description L97.522 Non-pressure chronic ulcer of other part of left foot with fat layer expo Modifier: sed Quantity: Physician Procedures CPT4 Code: [id number] Description: 97597 - WC PHYS DEBR WO ANESTH 20 SQ CM Modifier: Quantity: 1 CPT4 Code: Description: ICD-10 Diagnosis Description L97.522 Non-pressure chronic ulcer of other part of left foot with fat layer expo Modifier: sed Quantity: Electronic Signature(s) Signed: 11/01/2020 3:34:19 PM By: Lenda Kelp PA-C Entered By: Lenda Kelp on 11/01/2020 15:34:18

## 2020-11-09 DIAGNOSIS — E1142 Type 2 diabetes mellitus with diabetic polyneuropathy: Secondary | ICD-10-CM | POA: Diagnosis not present

## 2020-11-09 DIAGNOSIS — E11621 Type 2 diabetes mellitus with foot ulcer: Secondary | ICD-10-CM | POA: Diagnosis not present

## 2020-11-09 DIAGNOSIS — L97412 Non-pressure chronic ulcer of right heel and midfoot with fat layer exposed: Secondary | ICD-10-CM | POA: Diagnosis not present

## 2020-11-09 NOTE — Addendum Note (Signed)
Addended by: Dorcas Carrow on: 11/09/2020 10:05 AM   Modules accepted: Level of Service

## 2020-11-15 ENCOUNTER — Encounter: Payer: BC Managed Care – PPO | Attending: Physician Assistant | Admitting: Physician Assistant

## 2020-11-15 ENCOUNTER — Other Ambulatory Visit: Payer: Self-pay

## 2020-11-15 DIAGNOSIS — E11621 Type 2 diabetes mellitus with foot ulcer: Secondary | ICD-10-CM | POA: Diagnosis not present

## 2020-11-15 DIAGNOSIS — E1140 Type 2 diabetes mellitus with diabetic neuropathy, unspecified: Secondary | ICD-10-CM | POA: Diagnosis not present

## 2020-11-15 DIAGNOSIS — L97512 Non-pressure chronic ulcer of other part of right foot with fat layer exposed: Secondary | ICD-10-CM | POA: Insufficient documentation

## 2020-11-15 DIAGNOSIS — L97522 Non-pressure chronic ulcer of other part of left foot with fat layer exposed: Secondary | ICD-10-CM | POA: Insufficient documentation

## 2020-11-15 NOTE — Progress Notes (Signed)
[name], [name] ([id number]) Visit Report for 11/15/2020 Chief Complaint Document Details Patient Name: [name], [name] Date of Service: 11/15/2020 9:00 AM Medical Record Number: [id number] Patient Account Number: [id number] Date of Birth/Sex: October 23, 1961 (59 y.o. M) Treating RN: Huel Coventry Primary Care Provider: PATIENT, NO Other Clinician: Referring Provider: Roosvelt Maser Treating Provider/Extender: Rowan Blase in Treatment: 23 Information Obtained from: Patient Chief Complaint Left foot/toe ulcers Electronic Signature(s) Signed: 11/15/2020 10:40:52 AM By: Lenda Kelp PA-C Entered By: Lenda Kelp on 11/15/2020 10:40:51 Buntyn, Lionell ([id number]) -------------------------------------------------------------------------------- Debridement Details Patient Name: [name] Date of Service: 11/15/2020 9:00 AM Medical Record Number: [id number] Patient Account Number: [id number] Date of Birth/Sex: 05-13-1961 (59 y.o. M) Treating RN: Rogers Blocker Primary Care Provider: PATIENT, NO Other Clinician: Referring Provider: Roosvelt Maser Treating Provider/Extender: Rowan Blase in Treatment: 23 Debridement Performed for Wound #2 Left,Anterior Toe Great Assessment: Performed By: Physician Nelida Meuse., PA-C Debridement Type: Debridement Severity of Tissue Pre Debridement: Fat layer exposed Level of Consciousness (Pre- Awake and Alert procedure): Pre-procedure Verification/Time Out Yes - 09:13 Taken: Start Time: 09:13 Total Area Debrided (L x W): 0.2 (cm) x 0.2 (cm) = 0.04 (cm) Tissue and other material Viable, Non-Viable, Callus, Slough, Subcutaneous, Slough debrided: Level: Skin/Subcutaneous Tissue Debridement Description: Excisional Instrument: Curette Bleeding: Minimum Hemostasis Achieved: Pressure End Time: 09:18 Procedural Pain: 0 Post Procedural Pain: 0 Response to Treatment: Procedure was tolerated well Level of Consciousness (Post- Awake and  Alert procedure): Post Debridement Measurements of Total Wound Length: (cm) 0.2 Width: (cm) 0.2 Depth: (cm) 0.2 Volume: (cm) 0.006 Character of Wound/Ulcer Post Debridement: Stable Severity of Tissue Post Debridement: Fat layer exposed Post Procedure Diagnosis Same as Pre-procedure Electronic Signature(s) Signed: 11/15/2020 10:25:55 AM By: Lajean Manes Signed: 11/15/2020 5:22:05 PM By: Lenda Kelp PA-C Entered By: Phillis Haggis, Dondra Prader on 11/15/2020 09:29:37 Witherington, Vondell ([id number]) -------------------------------------------------------------------------------- HPI Details Patient Name: [name] Date of Service: 11/15/2020 9:00 AM Medical Record Number: [id number] Patient Account Number: [id number] Date of Birth/Sex: [birth year] (59 y.o. M) Treating RN: Huel Coventry Primary Care Provider: PATIENT, NO Other Clinician: Referring Provider: Roosvelt Maser Treating Provider/Extender: Rowan Blase in Treatment: 23 History of Present Illness HPI Description: 06/10/2020 upon evaluation today patient appears to be doing poorly in regard to his right plantar foot, left great toe, and left second toe. Unfortunately he has significant wounds at these locations but especially in regard to the second toe which does have a lot of necrotic tissue over the distal portion of the toe. The great toe does have some necrotic tissue as well. With that being said the patient tells me that this has been going on for 1-2 months and seems to be getting worse especially in regard to the toe. He has not had any specific x-rays to identify obvious signs of osteomyelitis up to this point that something would likely get a different today as well. With that being said he also has not had any current arterial studies which I think is something else that we probably will look into performing at this point. He does have a history of diabetes mellitus type 2, and diabetic neuropathy. Currently he has  been using normal shoes for ambulation though he does have some kind of external device so that he does not have to have actual still toed boots anymore at work which he states has helped nonetheless we is at work he does have to have something that protects his feet as such per policy. He tells me he  is having to do a lot more walking right now compared to normal but he can try to see if he can change something in that regard. 06/16/2020 upon evaluation today patient appears to be doing well all things considered with regard to his wounds. I do not feel like anything is it any worse and overall I feel like the Bactrim has done well. The culture that I reviewed did not reveal any specific organisms unfortunately that would help tailor treatment. I did actually review the x-ray as well it does appear that he has evidence of osteomyelitis of the distal tuft of the second toe left foot. That was discussed with him today. He likely will need a referral to infectious disease. 06/30/2020 on evaluation today patient appears to be doing well at this time in regard to his wounds all things considered. The erythema has improved he does have evidence of osteomyelitis again the second toe on the left foot but at the other sites there were no obvious signs. He does have wounds bilaterally on his feet and toes. These are to require sharp debridement today. He is still pending as far as his appointment with infectious disease. With that being said this is actually scheduled for 29 July which is this month. He also has an appointment on the 27th with vascular. Overall at least we are getting to the point where I think we are getting something is taken care of here. 07/12/2020 patient did have his appointment with [facility] infectious disease. They have placed him on medications which include Flagyl 500 mg and Levaquin 750 mg for the next several weeks until September 9. Other than that he overall seems to be looking quite well  and I am very pleased with how things are progressing at this point. 07/21/2020 on evaluation today patient appears to be doing well with regard to his wounds. He is on Flagyl as well as Levaquin which is helping to take care of the infection it appears. Fortunately there is no signs of active infection systemically at this time which is also good news. I am very pleased with where things stand. 07/28/20 on evaluation today patient appears to be doing well with regard to his wounds. He has been tolerating the dressing changes without complication we've been using the alginate currently. With that being said I do feel like the patient is making good progress albeit slow. 08/04/2020 on evaluation today patient's wound actually is showing signs of good improvement which is great news and overall very pleased with where things stand. Especially in regard to the right foot. The left great toe unfortunately is still having a lot of drainage and is somewhat macerated. This actually does appear to go deeper and is actually showing signs of bone exposure in the base of the wound. Again he is on antibiotics and I think we could consider HBO therapy in fact this is something I may discuss with him next week depending on how the toe looks. With that being said in the short-term I think I am going to actually recommend that he change the dressing more frequently right has been doing every other day 08/16/2020 on evaluation today patient appears to be doing well with regard to his ulcers. He is going require some debridement of both sites remaining he still is having a lot of drainage from the toe but again this seems to be making some progress here just very slowly. I did discuss with him the possibility of hyperbarics today but  he really was not interested in that at this point. 08/25/2020 upon evaluation today patient's wounds appear to be doing okay at this point. He is progressing quite slowly at this time  but nonetheless does seem to be making fairly good progress at this time which is good news. Fortunately there is no signs of active infection at this time. He does have an appointment with his infectious disease doctor next week on Thursday therefore he will not be able to make the appointment here we will likely see him in 2 weeks in that regard. 09/09/2020 upon evaluation today patient appears to be doing well currently in regard to his wounds. He still has openings at both locations although the great toe on the left does appear to be a little smaller to me which is good news. Fortunately there is no signs of active infection at this time which is excellent. 09/30/2020 on evaluation today patient appears to be doing well with regard to his left great toe ulcer this is measuring a little smaller. In regard to the right foot plantar unfortunately this is not really significantly improved. I think the biggest issue here is he continues to wear his work shoes and walks sufficiently on this to because it did not really be able to close as effectively as it needs to. We previously discussed total contact casting but the patient states he cannot do this and work and he also can do this and drive which is essential to him. He tells me that he does not really have any time off and he is very concerned about the fact that he has to continue to work. Again this is a discussion we have had multiple times in the past as well. 10/13/2020 on evaluation today patient appears to be doing decently well in regard to his left great toe ulcer this is not as macerated mainly likely due to the fact that he is not working currently in his work boots for a large portion of his day. 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 He ended up going to urgent care subsequently to the hospital and has since had the amputation. I did not view that  today as it is wrapped up and he recently just saw his podiatrist in that regard. Fortunately there is no signs of active infection at this time which is great news. Ranell PatrickORRIS, Rayne ([id number]) 10/20/2020 upon evaluation today patient appears to be doing actually very well in regard to his toe ulcer. He has been tolerating the dressing changes without complication. Fortunately there is no evidence of active infection which is great news and in general I been very pleased with how things seem to be progressing. No fevers, chills, nausea, vomiting, or diarrhea. 10/27/2020 on evaluation today patient's wound actually showing signs of good improvement. Fortunately there is no signs of active infection at this time. There does not appear to be any evidence of drainage significantly and I think that the biggest issue he is having is keeping his Band- Aid in place so that the wound does not dry out. Fortunately this is a better problem to have than it staying too wet like it was previous. I do believe we can manage this. 11/01/2020 upon evaluation today patient appears to be doing about the same in regard to his toe ulcer. I feel like this is still not really showing signs of the improvement that  I would expect. I am the like to remove some of the callus around the edges of the wound today is much as I can. 11/15/2020 on evaluation today patient actually appears to be doing well with regard to his toe ulcer. I feel like he is making progress. It is taken some time to get this to heal of course and he continues to develop some callus we will would need to remove that today. Nonetheless I feel like he is making progress but me we want to try something little different I am thinking endoform would be a good option to see how that we will do for him. Patient is in agreement with that plan. Electronic Signature(s) Signed: 11/15/2020 4:29:03 PM By: Lenda Kelp PA-C Entered By: Lenda Kelp on 11/15/2020  16:29:02 Corbo, Jamauri ([id number]) -------------------------------------------------------------------------------- Physical Exam Details Patient Name: [name] Date of Service: 11/15/2020 9:00 AM Medical Record Number: [id number] Patient Account Number: [id number] Date of Birth/Sex: August 11, 1961 (59 y.o. M) Treating RN: Huel Coventry Primary Care Provider: PATIENT, NO Other Clinician: Referring Provider: Roosvelt Maser Treating Provider/Extender: Rowan Blase in Treatment: 23 Constitutional Well-nourished and well-hydrated in no acute distress. Respiratory normal breathing without difficulty. Psychiatric this patient is able to make decisions and demonstrates good insight into disease process. Alert and Oriented x 3. pleasant and cooperative. Notes Upon inspection patient's wound bed actually did require sharp debridement to clear away some of the callus from the surface of the wound as well as some slough post debridement it actually appeared to be less deep which is good and actually I am very pleased with how things stand. I do believe I still want to try the endoform to see if this could be better for him. Electronic Signature(s) Signed: 11/15/2020 5:13:01 PM By: Lenda Kelp PA-C Entered By: Lenda Kelp on 11/15/2020 17:13:01 Fei, Fidel ([id number]) -------------------------------------------------------------------------------- Physician Orders Details Patient Name: [name] Date of Service: 11/15/2020 9:00 AM Medical Record Number: [id number] Patient Account Number: [id number] Date of Birth/Sex: 01/07/1961 (59 y.o. M) Treating RN: Rogers Blocker Primary Care Provider: PATIENT, NO Other Clinician: Referring Provider: Roosvelt Maser Treating Provider/Extender: Rowan Blase in Treatment: 23 Verbal / Phone Orders: No Diagnosis Coding Wound Cleansing Wound #2 Left,Anterior Toe Great o Clean wound with Normal Saline. Anesthetic (add to Medication  List) Wound #2 Left,Anterior Toe Great o Topical Lidocaine 4% cream applied to wound bed prior to debridement (In Clinic Only). Primary Wound Dressing Wound #2 Left,Anterior Toe Great o Other: - endoform moisten with surgical lube Secondary Dressing Wound #2 Left,Anterior Toe Great o Other - coverlet Dressing Change Frequency Wound #2 Left,Anterior Toe Great o Change dressing every other day. Follow-up Appointments Wound #2 Left,Anterior Toe Great o Return Appointment in 2 weeks. Edema Control Wound #2 Left,Anterior Toe Great o Elevate legs to the level of the heart and pump ankles as often as possible Additional Orders / Instructions Wound #2 Left,Anterior Toe Great o Increase protein intake. Electronic Signature(s) Signed: 11/15/2020 10:25:55 AM By: Phillis Haggis, Dondra Prader Signed: 11/15/2020 5:22:05 PM By: Lenda Kelp PA-C Entered By: Phillis Haggis, Dondra Prader on 11/15/2020 09:32:45 Vohs, Jye ([id number]) -------------------------------------------------------------------------------- Problem List Details Patient Name: [name] Date of Service: 11/15/2020 9:00 AM Medical Record Number: [id number] Patient Account Number: [id number] Date of Birth/Sex: 1961-05-12 (59 y.o. M) Treating RN: Huel Coventry Primary Care Provider: PATIENT, NO Other Clinician: Referring Provider: Roosvelt Maser Treating Provider/Extender: Rowan Blase in Treatment: 23 Active Problems ICD-10 Encounter Code Description Active Date MDM  Diagnosis E11.621 Type 2 diabetes mellitus with foot ulcer 06/07/2020 No Yes L97.522 Non-pressure chronic ulcer of other part of left foot with fat layer 06/07/2020 No Yes exposed L97.512 Non-pressure chronic ulcer of other part of right foot with fat layer 06/07/2020 No Yes exposed E11.40 Type 2 diabetes mellitus with diabetic neuropathy, unspecified 06/07/2020 No Yes Inactive Problems Resolved Problems Electronic Signature(s) Signed: 11/15/2020  10:28:59 AM By: Lenda Kelp PA-C Entered By: Lenda Kelp on 11/15/2020 10:28:58 Zarling, Younis ([id number]) -------------------------------------------------------------------------------- Progress Note Details Patient Name: [name] Date of Service: 11/15/2020 9:00 AM Medical Record Number: [id number] Patient Account Number: [id number] Date of Birth/Sex: [birth year] (59 y.o. M) Treating RN: Huel Coventry Primary Care Provider: PATIENT, NO Other Clinician: Referring Provider: Roosvelt Maser Treating Provider/Extender: Rowan Blase in Treatment: 23 Subjective Chief Complaint Information obtained from Patient Left foot/toe ulcers History of Present Illness (HPI) 06/10/2020 upon evaluation today patient appears to be doing poorly in regard to his right plantar foot, left great toe, and left second toe. Unfortunately he has significant wounds at these locations but especially in regard to the second toe which does have a lot of necrotic tissue over the distal portion of the toe. The great toe does have some necrotic tissue as well. With that being said the patient tells me that this has been going on for 1-2 months and seems to be getting worse especially in regard to the toe. He has not had any specific x-rays to identify obvious signs of osteomyelitis up to this point that something would likely get a different today as well. With that being said he also has not had any current arterial studies which I think is something else that we probably will look into performing at this point. He does have a history of diabetes mellitus type 2, and diabetic neuropathy. Currently he has been using normal shoes for ambulation though he does have some kind of external device so that he does not have to have actual still toed boots anymore at work which he states has helped nonetheless we is at work he does have to have something that protects his feet as such per policy. He tells me he is having  to do a lot more walking right now compared to normal but he can try to see if he can change something in that regard. 06/16/2020 upon evaluation today patient appears to be doing well all things considered with regard to his wounds. I do not feel like anything is it any worse and overall I feel like the Bactrim has done well. The culture that I reviewed did not reveal any specific organisms unfortunately that would help tailor treatment. I did actually review the x-ray as well it does appear that he has evidence of osteomyelitis of the distal tuft of the second toe left foot. That was discussed with him today. He likely will need a referral to infectious disease. 06/30/2020 on evaluation today patient appears to be doing well at this time in regard to his wounds all things considered. The erythema has improved he does have evidence of osteomyelitis again the second toe on the left foot but at the other sites there were no obvious signs. He does have wounds bilaterally on his feet and toes. These are to require sharp debridement today. He is still pending as far as his appointment with infectious disease. With that being said this is actually scheduled for 29 July which is this month. He also has an appointment on  the 27th with vascular. Overall at least we are getting to the point where I think we are getting something is taken care of here. 07/12/2020 patient did have his appointment with [facility] infectious disease. They have placed him on medications which include Flagyl 500 mg and Levaquin 750 mg for the next several weeks until September 9. Other than that he overall seems to be looking quite well and I am very pleased with how things are progressing at this point. 07/21/2020 on evaluation today patient appears to be doing well with regard to his wounds. He is on Flagyl as well as Levaquin which is helping to take care of the infection it appears. Fortunately there is no signs of active infection  systemically at this time which is also good news. I am very pleased with where things stand. 07/28/20 on evaluation today patient appears to be doing well with regard to his wounds. He has been tolerating the dressing changes without complication we've been using the alginate currently. With that being said I do feel like the patient is making good progress albeit slow. 08/04/2020 on evaluation today patient's wound actually is showing signs of good improvement which is great news and overall very pleased with where things stand. Especially in regard to the right foot. The left great toe unfortunately is still having a lot of drainage and is somewhat macerated. This actually does appear to go deeper and is actually showing signs of bone exposure in the base of the wound. Again he is on antibiotics and I think we could consider HBO therapy in fact this is something I may discuss with him next week depending on how the toe looks. With that being said in the short-term I think I am going to actually recommend that he change the dressing more frequently right has been doing every other day 08/16/2020 on evaluation today patient appears to be doing well with regard to his ulcers. He is going require some debridement of both sites remaining he still is having a lot of drainage from the toe but again this seems to be making some progress here just very slowly. I did discuss with him the possibility of hyperbarics today but he really was not interested in that at this point. 08/25/2020 upon evaluation today patient's wounds appear to be doing okay at this point. He is progressing quite slowly at this time but nonetheless does seem to be making fairly good progress at this time which is good news. Fortunately there is no signs of active infection at this time. He does have an appointment with his infectious disease doctor next week on Thursday therefore he will not be able to make the appointment here we will  likely see him in 2 weeks in that regard. 09/09/2020 upon evaluation today patient appears to be doing well currently in regard to his wounds. He still has openings at both locations although the great toe on the left does appear to be a little smaller to me which is good news. Fortunately there is no signs of active infection at this time which is excellent. 09/30/2020 on evaluation today patient appears to be doing well with regard to his left great toe ulcer this is measuring a little smaller. In regard to the right foot plantar unfortunately this is not really significantly improved. I think the biggest issue here is he continues to wear his work shoes and walks sufficiently on this to because it did not really be able to close as effectively  as it needs to. We previously discussed total contact casting but the patient states he cannot do this and work and he also can do this and drive which is essential to him. He tells me that he does not really have any time off and he is very concerned about the fact that he has to continue to work. Again this is a discussion we have had multiple times in the past as well. GEESEY, Nabil ([id number]) 10/13/2020 on evaluation today patient appears to be doing decently well in regard to his left great toe ulcer this is not as macerated mainly likely due to the fact that he is not working currently in his work boots for a large portion of his day. 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 He ended up going to urgent care subsequently to the hospital and has since had the amputation. I did not view that today as it is wrapped up and he recently just saw his podiatrist in that regard. Fortunately there is no signs of active infection at this time which is great news. 10/20/2020 upon evaluation today patient appears to be doing actually very well in regard to his toe ulcer. He has  been tolerating the dressing changes without complication. Fortunately there is no evidence of active infection which is great news and in general I been very pleased with how things seem to be progressing. No fevers, chills, nausea, vomiting, or diarrhea. 10/27/2020 on evaluation today patient's wound actually showing signs of good improvement. Fortunately there is no signs of active infection at this time. There does not appear to be any evidence of drainage significantly and I think that the biggest issue he is having is keeping his Band- Aid in place so that the wound does not dry out. Fortunately this is a better problem to have than it staying too wet like it was previous. I do believe we can manage this. 11/01/2020 upon evaluation today patient appears to be doing about the same in regard to his toe ulcer. I feel like this is still not really showing signs of the improvement that I would expect. I am the like to remove some of the callus around the edges of the wound today is much as I can. 11/15/2020 on evaluation today patient actually appears to be doing well with regard to his toe ulcer. I feel like he is making progress. It is taken some time to get this to heal of course and he continues to develop some callus we will would need to remove that today. Nonetheless I feel like he is making progress but me we want to try something little different I am thinking endoform would be a good option to see how that we will do for him. Patient is in agreement with that plan. Objective Constitutional Well-nourished and well-hydrated in no acute distress. Vitals Time Taken: 9:01 AM, Height: 72 in, Weight: 249 lbs, BMI: 33.8, Temperature: 98.0 F, Pulse: 89 bpm, Respiratory Rate: 18 breaths/min, Blood Pressure: 135/83 mmHg. Respiratory normal breathing without difficulty. Psychiatric this patient is able to make decisions and demonstrates good insight into disease process. Alert and Oriented x 3.  pleasant and cooperative. General Notes: Upon inspection patient's wound bed actually did require sharp debridement to clear away some of the callus from the surface of the wound as well as some slough post debridement it actually appeared to be less deep which  is good and actually I am very pleased with how things stand. I do believe I still want to try the endoform to see if this could be better for him. Integumentary (Hair, Skin) Wound #2 status is Open. Original cause of wound was Gradually Appeared. The wound is located on the [employer]. The wound measures 0.2cm length x 0.2cm width x 0.2cm depth; 0.031cm^2 area and 0.006cm^3 volume. There is Fat Layer (Subcutaneous Tissue) exposed. There is a small amount of serous drainage noted. The wound margin is epibole. There is large (67-100%) pink granulation within the wound bed. There is no necrotic tissue within the wound bed. 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 There was a Excisional Skin/Subcutaneous Tissue Debridement with a total area of 0.04 sq cm performed by Nelida Meuse., PA-C. With the following instrument(s): Curette to remove Viable and Non-Viable tissue/material. Material removed includes Callus, Subcutaneous Tissue, and Slough. A time out was conducted at 09:13, prior to the start of the procedure. A Minimum amount of bleeding was controlled with Pressure. The procedure was tolerated well with a pain level of 0 throughout and a pain  level of 0 following the procedure. Post Debridement Measurements: 0.2cm length x 0.2cm width x 0.2cm depth; 0.006cm^3 volume. Character of Wound/Ulcer Post Debridement is stable. Severity of Tissue Post Debridement is: Fat layer exposed. Post procedure Diagnosis Wound #2: Same as Pre-Procedure Plan Wound Cleansing: Wound #2 Left,Anterior Toe Great: Clean wound with Normal Saline. Anesthetic (add to Medication List): Wound #2 Left,Anterior Toe Great: Topical Lidocaine 4% cream applied to wound bed prior to debridement (In Clinic Only). Primary Wound Dressing: Wound #2 Left,Anterior Toe Great: Other: - endoform moisten with surgical lube Secondary Dressing: Wound #2 Left,Anterior Toe Great: Other - coverlet Dressing Change Frequency: Wound #2 Left,Anterior Toe Great: Change dressing every other day. Follow-up Appointments: Wound #2 Left,Anterior Toe Great: Return Appointment in 2 weeks. Edema Control: Wound #2 Left,Anterior Toe Great: Elevate legs to the level of the heart and pump ankles as often as possible Additional Orders / Instructions: Wound #2 Left,Anterior Toe Great: Increase protein intake. 1. Would recommend currently that we going to continue with the wound care measures as before specifically with regard to offloading. I think still try to keep as much pressure off of the dorsal surface of the foot as possible as ideal 2. I am also can recommend currently that we switch to endoform moistened with hydrogel to try to see if we can keep this moist and allow it to heal more effectively and quickly. 3. I am also can recommend currently that we have the patient continue to minimize his walking especially with anything that may rub on the dorsal surface of the toe as I could keep this wound open further and longer but I am hopeful that it will continue to show signs of good improvement and we can get this to close. We will see patient back for reevaluation in 2 weeks here in  the clinic. If anything worsens or changes patient will contact our office for additional recommendations. Electronic Signature(s)  Signed: 11/15/2020 5:13:59 PM By: Lenda Kelp PA-C Entered By: Lenda Kelp on 11/15/2020 17:13:59 Brainerd, Garek ([id number]) -------------------------------------------------------------------------------- SuperBill Details Patient Name: [name] Date of Service: 11/15/2020 Medical Record Number: [id number] Patient Account Number: [id number] Date of Birth/Sex: 10/12/1961 (59 y.o. M) Treating RN: Huel Coventry Primary Care Provider: PATIENT, NO Other Clinician: Referring Provider: Roosvelt Maser Treating Provider/Extender: Rowan Blase in Treatment: 23 Diagnosis Coding ICD-10 Codes Code Description E11.621 Type 2 diabetes mellitus with foot ulcer 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.512 Non-pressure chronic ulcer of other part of right foot with fat layer exposed E11.40 Type 2 diabetes mellitus with diabetic neuropathy, unspecified Facility Procedures CPT4 Code: 34742595 Description: 11042 - DEB SUBQ TISSUE 20 SQ CM/< Modifier: Quantity: 1 CPT4 Code: Description: ICD-10 Diagnosis Description L97.522 Non-pressure chronic ulcer of other part of left foot with fat layer exp Modifier: osed Quantity: Physician Procedures CPT4 Code: 6387564 Description: 11042 - WC PHYS SUBQ TISS 20 SQ CM Modifier: Quantity: 1 CPT4 Code: Description: ICD-10 Diagnosis Description L97.522 Non-pressure chronic ulcer of other part of left foot with fat layer exp Modifier: osed Quantity: Electronic Signature(s) Signed: 11/15/2020 5:14:08 PM By: Lenda Kelp PA-C Entered By: Lenda Kelp on 11/15/2020 17:14:07

## 2020-11-15 NOTE — Progress Notes (Addendum)
[name], [name] ([id number]) Visit Report for 11/15/2020 Arrival Information Details Patient Name: [name], [name] Date of Service: 11/15/2020 9:00 AM Medical Record Number: [id number] Patient Account Number: [id number] Date of Birth/Sex: Feb 26, 1961 (59 y.o. M) Treating RN: Rogers Blocker Primary Care Arturo Freundlich: PATIENT, NO Other Clinician: Referring Lamonta Cypress: Roosvelt Maser Treating Aracelie Addis/Extender: Rowan Blase in Treatment: 23 Visit Information History Since Last Visit Pain Present Now: No Patient Arrived: Ambulatory Arrival Time: 09:01 Accompanied By: self Transfer Assistance: None Patient Identification Verified: Yes Secondary Verification Process Completed: Yes Patient Requires Transmission-Based Precautions: No Patient Has Alerts: Yes Patient Alerts: Type II Diabetic Electronic Signature(s) Signed: 11/15/2020 10:25:55 AM By: Phillis Haggis, Dondra Prader Entered By: Phillis Haggis, Dondra Prader on 11/15/2020 09:01:17 [name], [name] ([id number]) -------------------------------------------------------------------------------- [name] Date of Service: 11/15/2020 9:00 AM Medical Record Number: [id number] Patient Account Number: [id number] Date of Birth/Sex: 12-Jul-1961 (59 y.o. M) Treating RN: Rogers Blocker Primary Care Adelia Baptista: PATIENT, NO Other Clinician: Referring Maesyn Frisinger: Roosvelt Maser Treating Analina Filla/Extender: Rowan Blase in Treatment: 23 Encounter Discharge Information Items Post Procedure Vitals Discharge Condition: Stable Temperature (F): 98 Ambulatory Status: Ambulatory Pulse (bpm): 89 Discharge Destination: Home Respiratory Rate (breaths/min): 18 Transportation: Private Auto Blood Pressure (mmHg): 135/83 Accompanied By: self Schedule Follow-up Appointment: Yes Clinical Summary of Care: Electronic Signature(s) Signed: 11/15/2020 10:25:55 AM By: Phillis Haggis, Dondra Prader Entered By: Phillis Haggis, Dondra Prader  on 11/15/2020 09:31:32 [name], [name] ([id number]) -------------------------------------------------------------------------------- Lower Extremity Assessment Details Patient Name: [name] Date of Service: 11/15/2020 9:00 AM Medical Record Number: [id number] Patient Account Number: [id number] Date of Birth/Sex: 27-Jul-1961 (59 y.o. M) Treating RN: Rogers Blocker Primary Care Alethia Melendrez: PATIENT, NO Other Clinician: Referring Sai Moura: Roosvelt Maser Treating Kamera Dubas/Extender: Allen Derry Weeks in Treatment: 23 Edema Assessment Assessed: [Left: Yes] [Right: No] Edema: [Left: N] [Right: o] Electronic Signature(s) Signed: 11/15/2020 10:25:55 AM By: Phillis Haggis, Dondra Prader Entered By: Phillis Haggis, Dondra Prader on 11/15/2020 09:09:26 [name], [name] ([id number]) -------------------------------------------------------------------------------- Multi Wound Chart Details Patient Name: [name] Date of Service: 11/15/2020 9:00 AM Medical Record Number: [id number] Patient Account Number: [id number] Date of Birth/Sex: 1961/11/25 (59 y.o. M) Treating RN: Rogers Blocker Primary Care Malie Kashani: PATIENT, NO Other Clinician: Referring Bow Buntyn: Roosvelt Maser Treating Aideen Fenster/Extender: Rowan Blase in Treatment: 23 Vital Signs Height(in): 72 Pulse(bpm): 89 Weight(lbs): 249 Blood Pressure(mmHg): 135/83 Body Mass Index(BMI): 34 Temperature(F): 98.0 Respiratory Rate(breaths/min): 18 Photos: [N/A:N/A] Wound Location: Left, Anterior Toe Great N/A N/A Wounding Event: Gradually Appeared N/A N/A Primary Etiology: Diabetic Wound/Ulcer of the Lower N/A N/A Extremity Comorbid History: Cataracts, Type II Diabetes, N/A N/A Neuropathy Date Acquired: 05/07/2020 N/A N/A Weeks of Treatment: 23 N/A N/A Wound Status: Open N/A N/A Measurements L x W x D (cm) 0.1x0.1x0.4 N/A N/A Area (cm) : 0.008 N/A N/A Volume (cm) : 0.003 N/A N/A % Reduction in Area: 99.40% N/A N/A % Reduction in Volume: 98.80%  N/A N/A Classification: Grade 1 N/A N/A Exudate Amount: Small N/A N/A Exudate Type: Serous N/A N/A Exudate Color: amber N/A N/A Wound Margin: Epibole N/A N/A Granulation Amount: Large (67-100%) N/A N/A Granulation Quality: Pink N/A N/A Necrotic Amount: None Present (0%) N/A N/A Exposed Structures: Fat Layer (Subcutaneous Tissue): N/A N/A Yes Fascia: No Tendon: No Muscle: No Joint: No Bone: No Epithelialization: Large (67-100%) N/A N/A Treatment Notes Electronic Signature(s) Signed: 11/15/2020 10:25:55 AM By: Phillis Haggis, Dondra Prader Entered By: Phillis Haggis, Dondra Prader on 11/15/2020 09:13:32 [name], [name] ([id number]) -------------------------------------------------------------------------------- Multi-Disciplinary Care Plan Details Patient Name: [name] Date of Service: 11/15/2020 9:00 AM Medical Record Number: [id number] Patient  Account Number: [id number] Date of Birth/Sex: [birth year] (59 y.o. M) Treating RN: Rogers Blocker Primary Care Meshia Rau: PATIENT, NO Other Clinician: Referring Kaelem Brach: Roosvelt Maser Treating Genevia Bouldin/Extender: Rowan Blase in Treatment: 23 Active Inactive Necrotic Tissue Nursing Diagnoses: Impaired tissue integrity related to necrotic/devitalized tissue Goals: Necrotic/devitalized tissue will be minimized in the wound bed Date Initiated: 06/07/2020 Target Resolution Date: 11/03/2020 Goal Status: Active Interventions: Assess patient pain level pre-, during and post procedure and prior to discharge Treatment Activities: Excisional debridement : 06/07/2020 Notes: Electronic Signature(s) Signed: 11/15/2020 10:25:55 AM By: Phillis Haggis, Dondra Prader Entered By: Phillis Haggis, Dondra Prader on 11/15/2020 09:13:17 [name], [name] ([id number]) -------------------------------------------------------------------------------- Pain Assessment Details Patient Name: [name] Date of Service: 11/15/2020 9:00 AM Medical Record Number:  [id number] Patient Account Number: [id number] Date of Birth/Sex: 1961-02-22 (59 y.o. M) Treating RN: Rogers Blocker Primary Care Aaleigha Bozza: PATIENT, NO Other Clinician: Referring Jamus Loving: Roosvelt Maser Treating Trissa Molina/Extender: Rowan Blase in Treatment: 23 Active Problems Location of Pain Severity and Description of Pain Patient Has Paino No Site Locations Rate the pain. Current Pain Level: 0 Pain Management and Medication Current Pain Management: Electronic Signature(s) Signed: 11/15/2020 10:25:55 AM By: Phillis Haggis, Dondra Prader Entered By: Phillis Haggis, Dondra Prader on 11/15/2020 09:03:20 Charlie, Camaron ([id number]) -------------------------------------------------------------------------------- Patient/Caregiver Education Details Patient Name: [name] Date of Service: 11/15/2020 9:00 AM Medical Record Number: [id number] Patient Account Number: [id number] Date of Birth/Gender: 1961-08-13 (59 y.o. M) Treating RN: Rogers Blocker Primary Care Physician: PATIENT, NO Other Clinician: Referring Physician: Roosvelt Maser Treating Physician/Extender: Rowan Blase in Treatment: 23 Education Assessment Education Provided To: Patient Education Topics Provided Wound Debridement: Methods: Explain/Verbal Responses: State content correctly Electronic Signature(s) Signed: 11/15/2020 10:25:55 AM By: Phillis Haggis, Dondra Prader Entered By: Phillis Haggis, Dondra Prader on 11/15/2020 09:30:20 Fahy, Plez ([id number]) -------------------------------------------------------------------------------- Wound Assessment Details Patient Name: [name] Date of Service: 11/15/2020 9:00 AM Medical Record Number: [id number] Patient Account Number: [id number] Date of Birth/Sex: 1961-01-02 (59 y.o. M) Treating RN: Rogers Blocker Primary Care Adonnis Salceda: PATIENT, NO Other Clinician: Referring Nicola Quesnell: Roosvelt Maser Treating Lydiann Bonifas/Extender: Rowan Blase in Treatment: 23 Wound Status Wound  Number: 2 Primary Etiology: Diabetic Wound/Ulcer of the Lower Extremity Wound Location: Left, Anterior Toe Great Wound Status: Open Wounding Event: Gradually Appeared Comorbid History: Cataracts, Type II Diabetes, Neuropathy Date Acquired: 05/07/2020 Weeks Of Treatment: 23 Clustered Wound: No Photos Wound Measurements Length: (cm) 0.2 Width: (cm) 0.2 Depth: (cm) 0.2 Area: (cm) 0.031 Volume: (cm) 0.006 % Reduction in Area: 97.6% % Reduction in Volume: 97.7% Epithelialization: Large (67-100%) Wound Description Classification: Grade 1 Fo Wound Margin: Epibole Sl Exudate Amount: Small Exudate Type: Serous Exudate Color: amber ul Odor After Cleansing: No ough/Fibrino No Wound Bed Granulation Amount: Large (67-100%) Exposed Structure Granulation Quality: Pink Fascia Exposed: No Necrotic Amount: None Present (0%) Fat Layer (Subcutaneous Tissue) Exposed: Yes Tendon Exposed: No Muscle Exposed: No Joint Exposed: No Bone Exposed: No Treatment Notes Wound #2 (Left, Anterior Toe Great) Notes endoform moisten with ky jelly, bandaid Electronic Signature(s) Signed: 11/15/2020 10:25:55 AM By: Lendon Ka, Boyd ([id number]) Entered By: Phillis Haggis, Dondra Prader on 11/15/2020 09:29:56 Deloria, Markas ([id number]) -------------------------------------------------------------------------------- Vitals Details Patient Name: [name] Date of Service: 11/15/2020 9:00 AM Medical Record Number: [id number] Patient Account Number: [id number] Date of Birth/Sex: August 15, 1961 (60 y.o. M) Treating RN: Rogers Blocker Primary Care Maniyah Moller: PATIENT, NO Other Clinician: Referring Caidance Sybert: Roosvelt Maser Treating Elijah Phommachanh/Extender: Rowan Blase in Treatment: 23 Vital Signs Time Taken: 09:01 Temperature (F): 98.0 Height (in): 72 Pulse (bpm): 89 Weight (lbs): 249 Respiratory  Rate (breaths/min): 18 Body Mass Index (BMI): 33.8 Blood Pressure (mmHg): 135/83 Reference  Range: 80 - 120 mg / dl Electronic Signature(s) Signed: 11/15/2020 10:25:55 AM By: Phillis Haggis, Kenia Entered By: Phillis Haggis, Dondra Prader on 11/15/2020 09:03:08

## 2020-11-16 DIAGNOSIS — E1142 Type 2 diabetes mellitus with diabetic polyneuropathy: Secondary | ICD-10-CM | POA: Diagnosis not present

## 2020-11-16 DIAGNOSIS — L97522 Non-pressure chronic ulcer of other part of left foot with fat layer exposed: Secondary | ICD-10-CM | POA: Diagnosis not present

## 2020-11-17 ENCOUNTER — Other Ambulatory Visit: Payer: Self-pay

## 2020-11-17 ENCOUNTER — Ambulatory Visit (INDEPENDENT_AMBULATORY_CARE_PROVIDER_SITE_OTHER): Payer: BC Managed Care – PPO | Admitting: Family Medicine

## 2020-11-17 ENCOUNTER — Encounter: Payer: Self-pay | Admitting: Family Medicine

## 2020-11-17 VITALS — BP 146/86 | HR 81 | Temp 97.6°F | Wt 244.4 lb

## 2020-11-17 DIAGNOSIS — E1140 Type 2 diabetes mellitus with diabetic neuropathy, unspecified: Secondary | ICD-10-CM | POA: Diagnosis not present

## 2020-11-17 DIAGNOSIS — Z794 Long term (current) use of insulin: Secondary | ICD-10-CM

## 2020-11-17 MED ORDER — ACCU-CHEK FASTCLIX LANCETS MISC
12 refills | Status: DC
Start: 2020-11-17 — End: 2021-01-06

## 2020-11-17 NOTE — Assessment & Plan Note (Signed)
Doing well with current regimen. Will check BMP. Continue to monitor. Recheck 2 months with A1c. Refills up to date.

## 2020-11-17 NOTE — Progress Notes (Signed)
BP (!) 146/86   Pulse 81   Temp 97.6 F (36.4 C)   Wt 244 lb 6.4 oz (110.9 kg)   SpO2 97%   BMI 33.15 kg/m    Subjective:    Patient ID: [name], male    DOB: May 26, 1961, 59 y.o.   MRN: [id number]  HPI: [name] is a 59 y.o. male  Chief Complaint  Patient presents with  . Diabetes   DIABETES Hypoglycemic episodes:no Polydipsia/polyuria: no Visual disturbance: no Chest pain: no Paresthesias: yes Glucose Monitoring: yes  Accucheck frequency: BID  Fasting glucose: 80s-133 Taking Insulin?: yes  Long acting insulin: tresiba Blood Pressure Monitoring: not checking Retinal Examination: Not up to Date Foot Exam: Up to Date Diabetic Education: Completed Pneumovax: Up to Date Influenza: Up to Date Aspirin: yes  Relevant past medical, surgical, family and social history reviewed and updated as indicated. Interim medical history since our last visit reviewed. Allergies and medications reviewed and updated.  Review of Systems  Constitutional: Negative.   Respiratory: Negative.   Cardiovascular: Negative.   Gastrointestinal: Negative.   Musculoskeletal: Negative.   Psychiatric/Behavioral: Negative.     Per HPI unless specifically indicated above     Objective:    BP (!) 146/86   Pulse 81   Temp 97.6 F (36.4 C)   Wt 244 lb 6.4 oz (110.9 kg)   SpO2 97%   BMI 33.15 kg/m   Wt Readings from Last 3 Encounters:  11/17/20 244 lb 6.4 oz (110.9 kg)  10/20/20 236 lb 9.6 oz (107.3 kg)  10/09/20 237 lb 7 oz (107.7 kg)    Physical Exam Vitals and nursing note reviewed.  Constitutional:      General: He is not in acute distress.    Appearance: Normal appearance. He is not ill-appearing, toxic-appearing or diaphoretic.  HENT:     Head: Normocephalic and atraumatic.     Right Ear: External ear normal.     Left Ear: External ear normal.     Nose: Nose normal.     Mouth/Throat:     Mouth: Mucous membranes are moist.     Pharynx: Oropharynx is clear.   Eyes:     General: No scleral icterus.       Right eye: No discharge.        Left eye: No discharge.     Extraocular Movements: Extraocular movements intact.     Conjunctiva/sclera: Conjunctivae normal.     Pupils: Pupils are equal, round, and reactive to light.  Cardiovascular:     Rate and Rhythm: Normal rate and regular rhythm.     Pulses: Normal pulses.     Heart sounds: Normal heart sounds. No murmur heard. No friction rub. No gallop.   Pulmonary:     Effort: Pulmonary effort is normal. No respiratory distress.     Breath sounds: Normal breath sounds. No stridor. No wheezing, rhonchi or rales.  Chest:     Chest wall: No tenderness.  Musculoskeletal:        General: Normal range of motion.     Cervical back: Normal range of motion and neck supple.  Skin:    General: Skin is warm and dry.     Capillary Refill: Capillary refill takes less than 2 seconds.     Coloration: Skin is not jaundiced or pale.     Findings: No bruising, erythema, lesion or rash.  Neurological:     General: No focal deficit present.     Mental Status: He  is alert and oriented to person, place, and time. Mental status is at baseline.  Psychiatric:        Mood and Affect: Mood normal.        Behavior: Behavior normal.        Thought Content: Thought content normal.        Judgment: Judgment normal.     Results for orders placed or performed in visit on 10/20/20  Microscopic Examination   Urine  Result Value Ref Range   WBC, UA 0-5 0 - 5 /hpf   RBC None seen 0 - 2 /hpf   Epithelial Cells (non renal) 0-10 0 - 10 /hpf   Casts Present None seen /lpf   Cast Type Hyaline casts N/A   Mucus, UA Present Not Estab.   Bacteria, UA None seen None seen/Few  Basic metabolic panel  Result Value Ref Range   Glucose 64 (L) 65 - 99 mg/dL   BUN 20 6 - 24 mg/dL   Creatinine, Ser 6.46 0.76 - 1.27 mg/dL   GFR calc non Af Amer 85 >59 mL/min/1.73   GFR calc Af Amer 99 >59 mL/min/1.73   BUN/Creatinine Ratio 20 9  - 20   Sodium 140 134 - 144 mmol/L   Potassium 4.9 3.5 - 5.2 mmol/L   Chloride 102 96 - 106 mmol/L   CO2 23 20 - 29 mmol/L   Calcium 9.6 8.7 - 10.2 mg/dL  CBC with Differential/Platelet  Result Value Ref Range   WBC 8.4 3.4 - 10.8 x10E3/uL   RBC 4.18 4.14 - 5.80 x10E6/uL   Hemoglobin 13.3 13.0 - 17.7 g/dL   Hematocrit 80.3 21.2 - 51.0 %   MCV 95 79 - 97 fL   MCH 31.8 26.6 - 33.0 pg   MCHC 33.6 31.5 - 35.7 g/dL   RDW 24.8 25.0 - 03.7 %   Platelets 310 150 - 450 x10E3/uL   Neutrophils 63 Not Estab. %   Lymphs 28 Not Estab. %   Monocytes 7 Not Estab. %   Eos 1 Not Estab. %   Basos 1 Not Estab. %   Neutrophils Absolute 5.3 1.4 - 7.0 x10E3/uL   Lymphocytes Absolute 2.4 0.7 - 3.1 x10E3/uL   Monocytes Absolute 0.6 0.1 - 0.9 x10E3/uL   EOS (ABSOLUTE) 0.1 0.0 - 0.4 x10E3/uL   Basophils Absolute 0.1 0.0 - 0.2 x10E3/uL   Immature Granulocytes 0 Not Estab. %   Immature Grans (Abs) 0.0 0.0 - 0.1 x10E3/uL  Urinalysis, Routine w reflex microscopic  Result Value Ref Range   Specific Gravity, UA 1.025 1.005 - 1.030   pH, UA 5.0 5.0 - 7.5   Color, UA Yellow Yellow   Appearance Ur Clear Clear   Leukocytes,UA Negative Negative   Protein,UA 2+ (A) Negative/Trace   Glucose, UA Negative Negative   Ketones, UA 1+ (A) Negative   RBC, UA Negative Negative   Bilirubin, UA Negative Negative   Urobilinogen, Ur 0.2 0.2 - 1.0 mg/dL   Nitrite, UA Negative Negative   Microscopic Examination See below:       Assessment & Plan:   Problem List Items Addressed This Visit      Endocrine   Type 2 diabetes mellitus with diabetic neuropathy, with long-term current use of insulin (HCC) - Primary    Doing well with current regimen. Will check BMP. Continue to monitor. Recheck 2 months with A1c. Refills up to date.       Relevant Orders   Basic metabolic panel  Follow up plan: Return in about 2 months (around 01/18/2021) for dm visit.

## 2020-11-18 LAB — BASIC METABOLIC PANEL
BUN/Creatinine Ratio: 23 — ABNORMAL HIGH (ref 9–20)
BUN: 17 mg/dL (ref 6–24)
CO2: 24 mmol/L (ref 20–29)
Calcium: 9.2 mg/dL (ref 8.7–10.2)
Chloride: 103 mmol/L (ref 96–106)
Creatinine, Ser: 0.74 mg/dL — ABNORMAL LOW (ref 0.76–1.27)
GFR calc Af Amer: 118 mL/min/{1.73_m2} (ref >59)
GFR calc non Af Amer: 102 mL/min/{1.73_m2} (ref >59)
Glucose: 82 mg/dL (ref 65–99)
Potassium: 4.3 mmol/L (ref 3.5–5.2)
Sodium: 142 mmol/L (ref 134–144)

## 2020-11-28 DIAGNOSIS — E113213 Type 2 diabetes mellitus with mild nonproliferative diabetic retinopathy with macular edema, bilateral: Secondary | ICD-10-CM | POA: Diagnosis not present

## 2020-11-28 LAB — HM DIABETES EYE EXAM

## 2020-11-29 ENCOUNTER — Encounter: Payer: BC Managed Care – PPO | Admitting: Physician Assistant

## 2020-11-29 ENCOUNTER — Other Ambulatory Visit: Payer: Self-pay

## 2020-11-29 DIAGNOSIS — L97512 Non-pressure chronic ulcer of other part of right foot with fat layer exposed: Secondary | ICD-10-CM | POA: Diagnosis not present

## 2020-11-29 DIAGNOSIS — E11621 Type 2 diabetes mellitus with foot ulcer: Secondary | ICD-10-CM | POA: Diagnosis not present

## 2020-11-29 DIAGNOSIS — E1140 Type 2 diabetes mellitus with diabetic neuropathy, unspecified: Secondary | ICD-10-CM | POA: Diagnosis not present

## 2020-11-29 DIAGNOSIS — L97522 Non-pressure chronic ulcer of other part of left foot with fat layer exposed: Secondary | ICD-10-CM | POA: Diagnosis not present

## 2020-11-29 NOTE — Progress Notes (Addendum)
BENNETT, RAM ([id number]) Visit Report for 11/29/2020 Chief Complaint Document Details Patient Name: AVIV, LENGACHER Date of Service: 11/29/2020 10:00 AM Medical Record Number: [id number] Patient Account Number: [id number] Date of Birth/Sex: Jan 31, 1961 (59 y.o. M) Treating RN: Huel Coventry Primary Care Provider: Roosvelt Maser Other Clinician: Referring Provider: Roosvelt Maser Treating Provider/Extender: Rowan Blase in Treatment: 25 Information Obtained from: Patient Chief Complaint Left foot/toe ulcers Electronic Signature(s) Signed: 11/29/2020 9:43:04 AM By: Lenda Kelp PA-C Entered By: Lenda Kelp on 11/29/2020 09:43:04 Leverette, Ghassan ([id number]) -------------------------------------------------------------------------------- HPI Details Patient Name: [name] Date of Service: 11/29/2020 10:00 AM Medical Record Number: [id number] Patient Account Number: [id number] Date of Birth/Sex: [birth year] (59 y.o. M) Treating RN: Huel Coventry Primary Care Provider: Roosvelt Maser Other Clinician: Referring Provider: Roosvelt Maser Treating Provider/Extender: Rowan Blase in Treatment: 25 History of Present Illness HPI Description: 06/10/2020 upon evaluation today patient appears to be doing poorly in regard to his right plantar foot, left great toe, and left second toe. Unfortunately he has significant wounds at these locations but especially in regard to the second toe which does have a lot of necrotic tissue over the distal portion of the toe. The great toe does have some necrotic tissue as well. With that being said the patient tells me that this has been going on for 1-2 months and seems to be getting worse especially in regard to the toe. He has not had any specific x-rays to identify obvious signs of osteomyelitis up to this point that something would likely get a different today as well. With that being said he also has not had any current arterial studies which I think is  something else that we probably will look into performing at this point. He does have a history of diabetes mellitus type 2, and diabetic neuropathy. Currently he has been using normal shoes for ambulation though he does have some kind of external device so that he does not have to have actual still toed boots anymore at work which he states has helped nonetheless we is at work he does have to have something that protects his feet as such per policy. He tells me he is having to do a lot more walking right now compared to normal but he can try to see if he can change something in that regard. 06/16/2020 upon evaluation today patient appears to be doing well all things considered with regard to his wounds. I do not feel like anything is it any worse and overall I feel like the Bactrim has done well. The culture that I reviewed did not reveal any specific organisms unfortunately that would help tailor treatment. I did actually review the x-ray as well it does appear that he has evidence of osteomyelitis of the distal tuft of the second toe left foot. That was discussed with him today. He likely will need a referral to infectious disease. 06/30/2020 on evaluation today patient appears to be doing well at this time in regard to his wounds all things considered. The erythema has improved he does have evidence of osteomyelitis again the second toe on the left foot but at the other sites there were no obvious signs. He does have wounds bilaterally on his feet and toes. These are to require sharp debridement today. He is still pending as far as his appointment with infectious disease. With that being said this is actually scheduled for 29 July which is this month. He also has an appointment on the 27th with vascular. Overall  at least we are getting to the point where I think we are getting something is taken care of here. 07/12/2020 patient did have his appointment with [facility] infectious disease. They have placed him  on medications which include Flagyl 500 mg and Levaquin 750 mg for the next several weeks until September 9. Other than that he overall seems to be looking quite well and I am very pleased with how things are progressing at this point. 07/21/2020 on evaluation today patient appears to be doing well with regard to his wounds. He is on Flagyl as well as Levaquin which is helping to take care of the infection it appears. Fortunately there is no signs of active infection systemically at this time which is also good news. I am very pleased with where things stand. 07/28/20 on evaluation today patient appears to be doing well with regard to his wounds. He has been tolerating the dressing changes without complication we've been using the alginate currently. With that being said I do feel like the patient is making good progress albeit slow. 08/04/2020 on evaluation today patient's wound actually is showing signs of good improvement which is great news and overall very pleased with where things stand. Especially in regard to the right foot. The left great toe unfortunately is still having a lot of drainage and is somewhat macerated. This actually does appear to go deeper and is actually showing signs of bone exposure in the base of the wound. Again he is on antibiotics and I think we could consider HBO therapy in fact this is something I may discuss with him next week depending on how the toe looks. With that being said in the short-term I think I am going to actually recommend that he change the dressing more frequently right has been doing every other day 08/16/2020 on evaluation today patient appears to be doing well with regard to his ulcers. He is going require some debridement of both sites remaining he still is having a lot of drainage from the toe but again this seems to be making some progress here just very slowly. I did discuss with him the possibility of hyperbarics today but he really was not  interested in that at this point. 08/25/2020 upon evaluation today patient's wounds appear to be doing okay at this point. He is progressing quite slowly at this time but nonetheless does seem to be making fairly good progress at this time which is good news. Fortunately there is no signs of active infection at this time. He does have an appointment with his infectious disease doctor next week on Thursday therefore he will not be able to make the appointment here we will likely see him in 2 weeks in that regard. 09/09/2020 upon evaluation today patient appears to be doing well currently in regard to his wounds. He still has openings at both locations although the great toe on the left does appear to be a little smaller to me which is good news. Fortunately there is no signs of active infection at this time which is excellent. 09/30/2020 on evaluation today patient appears to be doing well with regard to his left great toe ulcer this is measuring a little smaller. In regard to the right foot plantar unfortunately this is not really significantly improved. I think the biggest issue here is he continues to wear his work shoes and walks sufficiently on this to because it did not really be able to close as effectively as it needs to. We  previously discussed total contact casting but the patient states he cannot do this and work and he also can do this and drive which is essential to him. He tells me that he does not really have any time off and he is very concerned about the fact that he has to continue to work. Again this is a discussion we have had multiple times in the past as well. 10/13/2020 on evaluation today patient appears to be doing decently well in regard to his left great toe ulcer this is not as macerated mainly likely due to the fact that he is not working currently in his work boots for a large portion of his day. 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 He ended up going to urgent care subsequently to the hospital and has since had the amputation. I did not view that today as it is wrapped up and he recently just saw his podiatrist in that regard. Fortunately there is no signs of active infection at this time which is great news. DAVE, Montie ([id number]) 10/20/2020 upon evaluation today patient appears to be doing actually very well in regard to his toe ulcer. He has been tolerating the dressing changes without complication. Fortunately there is no evidence of active infection which is great news and in general I been very pleased with how things seem to be progressing. No fevers, chills, nausea, vomiting, or diarrhea. 10/27/2020 on evaluation today patient's wound actually showing signs of good improvement. Fortunately there is no signs of active infection at this time. There does not appear to be any evidence of drainage significantly and I think that the biggest issue he is having is keeping his Band- Aid in place so that the wound does not dry out. Fortunately this is a better problem to have than it staying too wet like it was previous. I do believe we can manage this. 11/01/2020 upon evaluation today patient appears to be doing about the same in regard to his toe ulcer. I feel like this is still not really showing signs of the improvement that I would expect. I am the like to remove some of the callus around the edges of the wound today is much as I can. 11/15/2020 on evaluation today patient actually appears to be doing well with regard to his toe ulcer. I feel like he is making progress. It is taken some time to get this to heal of course and he continues to develop some callus we will would need to remove that today. Nonetheless I feel like he is making progress but me we want to try something little different I am thinking endoform would be a good option to see how that we will do for him.  Patient is in agreement with that plan. 11/29/2020 on evaluation today patient appears to be doing excellent in regard to his ankle ulcer. In fact this appears to be completely healed based on what I am seeing today. There is no evidence of active infection at this time. No fevers, chills, nausea, vomiting, or diarrhea. Electronic Signature(s) Signed: 11/29/2020 10:21:37 AM By: Lenda Kelp PA-C Entered By: Lenda Kelp on 11/29/2020 10:21:37 Abshier, Shoaib ([id number]) -------------------------------------------------------------------------------- Physical Exam Details Patient Name: [name] Date of Service: 11/29/2020 10:00 AM Medical Record Number: [id number] Patient Account Number: [id number] Date of Birth/Sex: [birth year] (59 y.o. M) Treating RN: Huel Coventry Primary Care Provider: Roosvelt Maser Other  Clinician: Referring Provider: Roosvelt MaserLane, Rachel Treating Provider/Extender: Rowan BlaseStone, Latausha Flamm Weeks in Treatment: 25 Constitutional Well-nourished and well-hydrated in no acute distress. Respiratory normal breathing without difficulty. Psychiatric this patient is able to make decisions and demonstrates good insight into disease process. Alert and Oriented x 3. pleasant and cooperative. Notes I did not test some of the areas over where the wound was to ensure that this did indeed appear to be completely sealed up and closed. As best I can tell this appears to be the case. Fortunately I see no evidence of erythema, drainage, and the patient is having no discomfort. Electronic Signature(s) Signed: 11/29/2020 10:22:00 AM By: Lenda KelpStone III, Adamae Ricklefs PA-C Entered By: Lenda KelpStone III, Kaytlyn Din on 11/29/2020 10:21:59 Crear, Naasir ([id number]) -------------------------------------------------------------------------------- Physician Orders Details Patient Name: Lolita LenzNORRIS, Vito Date of Service: 11/29/2020 10:00 AM Medical Record Number: [id number] Patient Account Number: [id number] Date of Birth/Sex:  08/26/1961 (59 y.o. 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 E11.40 Type 2 diabetes mellitus with diabetic neuropathy, unspecified [facility] Electronic Signature(s) Signed: 11/29/2020 10:23:50 AM By: Lenda KelpStone III, Trayce Maino PA-C Entered By: Lenda KelpStone III, Dianey Suchy on 11/29/2020 10:23:50 Sylve, Cochise ([id number]) -------------------------------------------------------------------------------- Problem List Details Patient Name: Lolita LenzNORRIS, Jaisean Date of Service: 11/29/2020 10:00 AM Medical Record Number: [id number] Patient Account Number: [id number] Date of Birth/Sex: 12/10/1961 (59 y.o. M) Treating RN: Huel CoventryWoody, Kim Primary Care Provider: Roosvelt MaserLane, Rachel Other Clinician: Referring Provider: Roosvelt MaserLane, Rachel Treating Provider/Extender: Rowan BlaseStone, Donley Harland Weeks in Treatment: 25 Active Problems ICD-10 Encounter Code Description Active Date MDM Diagnosis E11.621 Type 2 diabetes mellitus with foot ulcer 06/07/2020 No Yes L97.522 Non-pressure chronic ulcer of other part of left foot with fat layer 06/07/2020 No Yes exposed L97.512 Non-pressure chronic ulcer of other part of right foot with fat layer 06/07/2020 No Yes exposed E11.40 Type 2 diabetes mellitus with diabetic neuropathy, unspecified 06/07/2020 No Yes Inactive Problems Resolved Problems Electronic Signature(s) Signed: 11/29/2020 9:42:57 AM By: Lenda KelpStone III, Geniece Akers PA-C Entered By: Lenda KelpStone III, Marthella Osorno on 11/29/2020 09:42:57 Pelot, Shaun  ([id number]) -------------------------------------------------------------------------------- Progress Note Details Patient Name: Lolita LenzNORRIS, Cortlandt Date of Service: 11/29/2020 10:00 AM Medical Record Number: [id number] Patient Account Number: [id number] Date of Birth/Sex: 05/23/1961 (59 y.o. M) Treating RN: Huel CoventryWoody, Kim Primary Care Provider: Roosvelt MaserLane, Rachel Other Clinician: Referring Provider: Roosvelt MaserLane, Rachel Treating Provider/Extender: Rowan BlaseStone, Loyde Orth Weeks in Treatment: 25 Subjective Chief Complaint Information obtained from Patient Left foot/toe ulcers History of Present Illness (HPI) 06/10/2020 upon evaluation today patient appears to be doing poorly in regard to his right plantar foot, left great toe, and left second toe. Unfortunately he has significant wounds at these locations but especially in regard to the second toe which does have a lot of necrotic tissue over the distal portion of the toe. The great toe does have some necrotic tissue as well. With that being said the patient tells me that this has been going on for 1-2 months and seems to be getting worse especially in regard to the toe. He has not had any specific x-rays to identify obvious signs of osteomyelitis up to this point that something would likely get a different today as well.  With that being said he also has not had any current arterial studies which I think is something else that we probably will look into performing at this point. He does have a history of diabetes mellitus type 2, and diabetic neuropathy. Currently he has been using normal shoes for ambulation though he does have some kind of external device so that he does not have to have actual still toed boots anymore at work which he states has helped nonetheless we is at work he does have to have something that protects his feet as such per policy. He tells me he is having to do a lot more walking right now compared to normal but he can try to see if he can change something  in that regard. 06/16/2020 upon evaluation today patient appears to be doing well all things considered with regard to his wounds. I do not feel like anything is it any worse and overall I feel like the Bactrim has done well. The culture that I reviewed did not reveal any specific organisms unfortunately that would help tailor treatment. I did actually review the x-ray as well it does appear that he has evidence of osteomyelitis of the distal tuft of the second toe left foot. That was discussed with him today. He likely will need a referral to infectious disease. 06/30/2020 on evaluation today patient appears to be doing well at this time in regard to his wounds all things considered. The erythema has improved he does have evidence of osteomyelitis again the second toe on the left foot but at the other sites there were no obvious signs. He does have wounds bilaterally on his feet and toes. These are to require sharp debridement today. He is still pending as far as his appointment with infectious disease. With that being said this is actually scheduled for 29 July which is this month. He also has an appointment on the 27th with vascular. Overall at least we are getting to the point where I think we are getting something is taken care of here. 07/12/2020 patient did have his appointment with [facility] infectious disease. They have placed him on medications which include Flagyl 500 mg and Levaquin 750 mg for the next several weeks until September 9. Other than that he overall seems to be looking quite well and I am very pleased with how things are progressing at this point. 07/21/2020 on evaluation today patient appears to be doing well with regard to his wounds. He is on Flagyl as well as Levaquin which is helping to take care of the infection it appears. Fortunately there is no signs of active infection systemically at this time which is also good news. I am very pleased with where things stand. 07/28/20 on  evaluation today patient appears to be doing well with regard to his wounds. He has been tolerating the dressing changes without complication we've been using the alginate currently. With that being said I do feel like the patient is making good progress albeit slow. 08/04/2020 on evaluation today patient's wound actually is showing signs of good improvement which is great news and overall very pleased with where things stand. Especially in regard to the right foot. The left great toe unfortunately is still having a lot of drainage and is somewhat macerated. This actually does appear to go deeper and is actually showing signs of bone exposure in the base of the wound. Again he is on antibiotics and I think we could consider HBO therapy in fact this  is something I may discuss with him next week depending on how the toe looks. With that being said in the short-term I think I am going to actually recommend that he change the dressing more frequently right has been doing every other day 08/16/2020 on evaluation today patient appears to be doing well with regard to his ulcers. He is going require some debridement of both sites remaining he still is having a lot of drainage from the toe but again this seems to be making some progress here just very slowly. I did discuss with him the possibility of hyperbarics today but he really was not interested in that at this point. 08/25/2020 upon evaluation today patient's wounds appear to be doing okay at this point. He is progressing quite slowly at this time but nonetheless does seem to be making fairly good progress at this time which is good news. Fortunately there is no signs of active infection at this time. He does have an appointment with his infectious disease doctor next week on Thursday therefore he will not be able to make the appointment here we will likely see him in 2 weeks in that regard. 09/09/2020 upon evaluation today patient appears to be doing well  currently in regard to his wounds. He still has openings at both locations although the great toe on the left does appear to be a little smaller to me which is good news. Fortunately there is no signs of active infection at this time which is excellent. 09/30/2020 on evaluation today patient appears to be doing well with regard to his left great toe ulcer this is measuring a little smaller. In regard to the right foot plantar unfortunately this is not really significantly improved. I think the biggest issue here is he continues to wear his work shoes and walks sufficiently on this to because it did not really be able to close as effectively as it needs to. We previously discussed total contact casting but the patient states he cannot do this and work and he also can do this and drive which is essential to him. He tells me that he does not really have any time off and he is very concerned about the fact that he has to continue to work. Again this is a discussion we have had multiple times in the past as well. NAKAMA, Jshawn ([id number]) 10/13/2020 on evaluation today patient appears to be doing decently well in regard to his left great toe ulcer this is not as macerated mainly likely due to the fact that he is not working currently in his work boots for a large portion of his day. 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 He ended up going to urgent care subsequently to the hospital and has since had the amputation. I did not view that today as it is wrapped up and he recently just saw his podiatrist in that regard. Fortunately there is no signs of active infection at this time which is great news. 10/20/2020 upon evaluation today patient appears to be doing actually very well in regard to his toe ulcer. He has been tolerating the dressing changes without complication. Fortunately there is no evidence of active  infection which is great news and in general I been very pleased with how things seem to be progressing. No fevers, chills, nausea, vomiting, or diarrhea. 10/27/2020 on evaluation today patient's wound actually showing signs of good  improvement. Fortunately there is no signs of active infection at this time. There does not appear to be any evidence of drainage significantly and I think that the biggest issue he is having is keeping his Band- Aid in place so that the wound does not dry out. Fortunately this is a better problem to have than it staying too wet like it was previous. I do believe we can manage this. 11/01/2020 upon evaluation today patient appears to be doing about the same in regard to his toe ulcer. I feel like this is still not really showing signs of the improvement that I would expect. I am the like to remove some of the callus around the edges of the wound today is much as I can. 11/15/2020 on evaluation today patient actually appears to be doing well with regard to his toe ulcer. I feel like he is making progress. It is taken some time to get this to heal of course and he continues to develop some callus we will would need to remove that today. Nonetheless I feel like he is making progress but me we want to try something little different I am thinking endoform would be a good option to see how that we will do for him. Patient is in agreement with that plan. 11/29/2020 on evaluation today patient appears to be doing excellent in regard to his ankle ulcer. In fact this appears to be completely healed based on what I am seeing today. There is no evidence of active infection at this time. No fevers, chills, nausea, vomiting, or diarrhea. Objective Constitutional Well-nourished and well-hydrated in no acute distress. Vitals Time Taken: 9:37 AM, Height: 72 in, Weight: 249 lbs, BMI: 33.8, Temperature: 98.2 F, Pulse: 94 bpm, Respiratory Rate: 16 breaths/min, Blood Pressure: 137/76  mmHg. Respiratory normal breathing without difficulty. Psychiatric this patient is able to make decisions and demonstrates good insight into disease process. Alert and Oriented x 3. pleasant and cooperative. General Notes: I did not test some of the areas over where the wound was to ensure that this did indeed appear to be completely sealed up and closed. As best I can tell this appears to be the case. Fortunately I see no evidence of erythema, drainage, and the patient is having no discomfort. Integumentary (Hair, Skin) Wound #2 status is Healed - Epithelialized. Original cause of wound was Gradually Appeared. The wound is located on the [employer]. The wound measures 0cm length x 0cm width x 0cm depth; 0cm^2 area and 0cm^3 volume. There is Fat Layer (Subcutaneous Tissue) exposed. There is no tunneling or undermining noted. There is a small amount of serous drainage noted. The wound margin is epibole. There is large (67- 100%) pink granulation within the wound bed. There is no necrotic tissue within the wound bed. General Notes: callus periwound Assessment Active Problems ICD-10 Type 2 diabetes mellitus with foot ulcer Non-pressure chronic ulcer of other part of left foot with fat layer exposed Non-pressure chronic ulcer of other part of right foot with fat layer exposed Type 2 diabetes mellitus with diabetic neuropathy, unspecified Moffa, [place] ([id number]) Plan Discharge From [facility] Services: Discharge from Wound Care Center 1. I would recommend currently that we going discontinue wound care services at this point and the patient is in agreement with the plan. 2. I did advise the patient however that he needs to be very diligent about watching for any signs of new wounds and opening it. Obviously if it starts to drain any at all  I wanted him to contact me as soon as possible and not wait on this whatsoever. We will see patient back for follow-up as needed Electronic  Signature(s) Signed: 11/29/2020 10:24:12 AM By: Lenda Kelp PA-C Entered By: Lenda Kelp on 11/29/2020 10:24:11 Kraeger, Kweku ([id number]) -------------------------------------------------------------------------------- SuperBill Details Patient Name: [name] Date of Service: 11/29/2020 Medical Record Number: [id number] Patient Account Number: [id number] Date of Birth/Sex: 1961-07-28 (59 y.o. M) Treating RN: Huel Coventry Primary Care Provider: Roosvelt Maser Other Clinician: Referring Provider: Roosvelt Maser Treating Provider/Extender: Rowan Blase in Treatment: 25 Diagnosis Coding ICD-10 Codes Code Description E11.621 Type 2 diabetes mellitus with foot ulcer 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.512 Non-pressure chronic ulcer of other part of right foot with fat layer exposed E11.40 Type 2 diabetes mellitus with diabetic neuropathy, unspecified Physician Procedures CPT4 Code: 3832919 Description: 99213 - WC PHYS LEVEL 3 - EST PT Modifier: Quantity: 1 CPT4 Code: Description: ICD-10 Diagnosis Description E11.621 Type 2 diabetes mellitus with foot ulcer L97.522 Non-pressure chronic ulcer of other part of left foot with fat layer ex L97.512 Non-pressure chronic ulcer of other part of right foot with fat layer e  E11.40 Type 2 diabetes mellitus with diabetic neuropathy, unspecified Modifier: posed xposed Quantity: Electronic Signature(s) Signed: 11/29/2020 10:24:25 AM By: Lenda Kelp PA-C Entered By: Lenda Kelp on 11/29/2020 10:24:25

## 2020-12-07 ENCOUNTER — Telehealth: Payer: Self-pay

## 2020-12-07 NOTE — Telephone Encounter (Signed)
FYI

## 2020-12-07 NOTE — Telephone Encounter (Signed)
Pt stated he received a form for [employer] with benefits changed of medication. Form placed in bin to be reviewed.

## 2021-01-06 ENCOUNTER — Emergency Department: Payer: BC Managed Care – PPO

## 2021-01-06 ENCOUNTER — Ambulatory Visit
Admission: EM | Admit: 2021-01-06 | Discharge: 2021-01-06 | Disposition: A | Discharge status: Home / Self Care | Payer: BC Managed Care – PPO | Attending: Family Medicine | Admitting: Family Medicine

## 2021-01-06 ENCOUNTER — Other Ambulatory Visit: Payer: Self-pay

## 2021-01-06 ENCOUNTER — Inpatient Hospital Stay: Payer: BC Managed Care – PPO

## 2021-01-06 ENCOUNTER — Inpatient Hospital Stay
Admission: EM | Admit: 2021-01-06 | Discharge: 2021-01-09 | DRG: 617 | Disposition: A | Discharge status: Home / Self Care | Payer: BC Managed Care – PPO | Attending: Internal Medicine | Admitting: Internal Medicine

## 2021-01-06 DIAGNOSIS — Z6831 Body mass index (BMI) 31.0-31.9, adult: Secondary | ICD-10-CM | POA: Diagnosis not present

## 2021-01-06 DIAGNOSIS — L03116 Cellulitis of left lower limb: Secondary | ICD-10-CM | POA: Diagnosis not present

## 2021-01-06 DIAGNOSIS — L97529 Non-pressure chronic ulcer of other part of left foot with unspecified severity: Secondary | ICD-10-CM | POA: Diagnosis present

## 2021-01-06 DIAGNOSIS — K219 Gastro-esophageal reflux disease without esophagitis: Secondary | ICD-10-CM | POA: Diagnosis present

## 2021-01-06 DIAGNOSIS — E669 Obesity, unspecified: Secondary | ICD-10-CM | POA: Diagnosis not present

## 2021-01-06 DIAGNOSIS — E1140 Type 2 diabetes mellitus with diabetic neuropathy, unspecified: Secondary | ICD-10-CM

## 2021-01-06 DIAGNOSIS — E11628 Type 2 diabetes mellitus with other skin complications: Secondary | ICD-10-CM

## 2021-01-06 DIAGNOSIS — E1142 Type 2 diabetes mellitus with diabetic polyneuropathy: Secondary | ICD-10-CM | POA: Diagnosis present

## 2021-01-06 DIAGNOSIS — E11621 Type 2 diabetes mellitus with foot ulcer: Secondary | ICD-10-CM | POA: Diagnosis not present

## 2021-01-06 DIAGNOSIS — Z89422 Acquired absence of other left toe(s): Secondary | ICD-10-CM | POA: Diagnosis present

## 2021-01-06 DIAGNOSIS — Z794 Long term (current) use of insulin: Secondary | ICD-10-CM

## 2021-01-06 DIAGNOSIS — L089 Local infection of the skin and subcutaneous tissue, unspecified: Secondary | ICD-10-CM | POA: Diagnosis not present

## 2021-01-06 DIAGNOSIS — M869 Osteomyelitis, unspecified: Secondary | ICD-10-CM

## 2021-01-06 DIAGNOSIS — M1990 Unspecified osteoarthritis, unspecified site: Secondary | ICD-10-CM | POA: Diagnosis not present

## 2021-01-06 DIAGNOSIS — A419 Sepsis, unspecified organism: Secondary | ICD-10-CM | POA: Diagnosis not present

## 2021-01-06 DIAGNOSIS — I739 Peripheral vascular disease, unspecified: Secondary | ICD-10-CM | POA: Diagnosis not present

## 2021-01-06 DIAGNOSIS — Z89421 Acquired absence of other right toe(s): Secondary | ICD-10-CM | POA: Diagnosis not present

## 2021-01-06 DIAGNOSIS — E1151 Type 2 diabetes mellitus with diabetic peripheral angiopathy without gangrene: Secondary | ICD-10-CM | POA: Diagnosis present

## 2021-01-06 DIAGNOSIS — Z8249 Family history of ischemic heart disease and other diseases of the circulatory system: Secondary | ICD-10-CM

## 2021-01-06 DIAGNOSIS — E1169 Type 2 diabetes mellitus with other specified complication: Principal | ICD-10-CM | POA: Diagnosis present

## 2021-01-06 DIAGNOSIS — M86172 Other acute osteomyelitis, left ankle and foot: Secondary | ICD-10-CM | POA: Diagnosis not present

## 2021-01-06 DIAGNOSIS — Z6835 Body mass index (BMI) 35.0-35.9, adult: Secondary | ICD-10-CM

## 2021-01-06 DIAGNOSIS — M2548 Effusion, other site: Secondary | ICD-10-CM | POA: Diagnosis not present

## 2021-01-06 DIAGNOSIS — S98912A Complete traumatic amputation of left foot, level unspecified, initial encounter: Secondary | ICD-10-CM | POA: Diagnosis not present

## 2021-01-06 DIAGNOSIS — E785 Hyperlipidemia, unspecified: Secondary | ICD-10-CM

## 2021-01-06 DIAGNOSIS — Z7984 Long term (current) use of oral hypoglycemic drugs: Secondary | ICD-10-CM | POA: Diagnosis not present

## 2021-01-06 DIAGNOSIS — L97519 Non-pressure chronic ulcer of other part of right foot with unspecified severity: Secondary | ICD-10-CM | POA: Diagnosis not present

## 2021-01-06 DIAGNOSIS — M25475 Effusion, left foot: Secondary | ICD-10-CM | POA: Diagnosis not present

## 2021-01-06 DIAGNOSIS — Z833 Family history of diabetes mellitus: Secondary | ICD-10-CM

## 2021-01-06 DIAGNOSIS — Z8619 Personal history of other infectious and parasitic diseases: Secondary | ICD-10-CM | POA: Diagnosis not present

## 2021-01-06 DIAGNOSIS — Z89432 Acquired absence of left foot: Secondary | ICD-10-CM | POA: Diagnosis not present

## 2021-01-06 DIAGNOSIS — M7989 Other specified soft tissue disorders: Secondary | ICD-10-CM | POA: Diagnosis not present

## 2021-01-06 DIAGNOSIS — E119 Type 2 diabetes mellitus without complications: Secondary | ICD-10-CM

## 2021-01-06 DIAGNOSIS — L98499 Non-pressure chronic ulcer of skin of other sites with unspecified severity: Secondary | ICD-10-CM | POA: Diagnosis not present

## 2021-01-06 DIAGNOSIS — Z8616 Personal history of COVID-19: Secondary | ICD-10-CM

## 2021-01-06 DIAGNOSIS — L03115 Cellulitis of right lower limb: Secondary | ICD-10-CM | POA: Diagnosis not present

## 2021-01-06 DIAGNOSIS — Z09 Encounter for follow-up examination after completed treatment for conditions other than malignant neoplasm: Secondary | ICD-10-CM

## 2021-01-06 DIAGNOSIS — R000 Tachycardia, unspecified: Secondary | ICD-10-CM | POA: Diagnosis not present

## 2021-01-06 DIAGNOSIS — Z9889 Other specified postprocedural states: Secondary | ICD-10-CM | POA: Diagnosis not present

## 2021-01-06 DIAGNOSIS — Z48810 Encounter for surgical aftercare following surgery on the sense organs: Secondary | ICD-10-CM | POA: Diagnosis not present

## 2021-01-06 DIAGNOSIS — M19072 Primary osteoarthritis, left ankle and foot: Secondary | ICD-10-CM | POA: Diagnosis not present

## 2021-01-06 LAB — COMPREHENSIVE METABOLIC PANEL
ALT: 19 U/L (ref 0–44)
AST: 23 U/L (ref 15–41)
Albumin: 3.8 g/dL (ref 3.5–5.0)
Alkaline Phosphatase: 49 U/L (ref 38–126)
Anion gap: 12 (ref 5–15)
BUN: 27 mg/dL — ABNORMAL HIGH (ref 6–20)
CO2: 24 mmol/L (ref 22–32)
Calcium: 9.3 mg/dL (ref 8.9–10.3)
Chloride: 99 mmol/L (ref 98–111)
Creatinine, Ser: 1.02 mg/dL (ref 0.61–1.24)
GFR, Estimated: >60 mL/min (ref >60)
Glucose, Bld: 77 mg/dL (ref 70–99)
Potassium: 3.8 mmol/L (ref 3.5–5.1)
Sodium: 135 mmol/L (ref 135–145)
Total Bilirubin: 0.9 mg/dL (ref 0.3–1.2)
Total Protein: 7.7 g/dL (ref 6.5–8.1)

## 2021-01-06 LAB — URINALYSIS, COMPLETE (UACMP) WITH MICROSCOPIC
Bacteria, UA: NONE SEEN
Bilirubin Urine: NEGATIVE
Glucose, UA: NEGATIVE mg/dL
Ketones, ur: 20 mg/dL — AB
Leukocytes,Ua: NEGATIVE
Nitrite: NEGATIVE
Protein, ur: 100 mg/dL — AB
Specific Gravity, Urine: 1.021 (ref 1.005–1.030)
Squamous Epithelial / HPF: NONE SEEN (ref 0–5)
pH: 5 (ref 5.0–8.0)

## 2021-01-06 LAB — CBC WITH DIFFERENTIAL/PLATELET
Abs Immature Granulocytes: 0.08 10*3/uL — ABNORMAL HIGH (ref 0.00–0.07)
Basophils Absolute: 0 10*3/uL (ref 0.0–0.1)
Basophils Relative: 0 %
Eosinophils Absolute: 0.1 10*3/uL (ref 0.0–0.5)
Eosinophils Relative: 0 %
HCT: 33.9 % — ABNORMAL LOW (ref 39.0–52.0)
Hemoglobin: 11.8 g/dL — ABNORMAL LOW (ref 13.0–17.0)
Immature Granulocytes: 1 %
Lymphocytes Relative: 7 %
Lymphs Abs: 1.1 10*3/uL (ref 0.7–4.0)
MCH: 32.1 pg (ref 26.0–34.0)
MCHC: 34.8 g/dL (ref 30.0–36.0)
MCV: 92.1 fL (ref 80.0–100.0)
Monocytes Absolute: 1.1 10*3/uL — ABNORMAL HIGH (ref 0.1–1.0)
Monocytes Relative: 7 %
Neutro Abs: 13.4 10*3/uL — ABNORMAL HIGH (ref 1.7–7.7)
Neutrophils Relative %: 85 %
Platelets: 219 10*3/uL (ref 150–400)
RBC: 3.68 MIL/uL — ABNORMAL LOW (ref 4.22–5.81)
RDW: 11.8 % (ref 11.5–15.5)
WBC: 15.9 10*3/uL — ABNORMAL HIGH (ref 4.0–10.5)
nRBC: 0 % (ref 0.0–0.2)

## 2021-01-06 LAB — CBG MONITORING, ED
Glucose-Capillary: 72 mg/dL (ref 70–99)
Glucose-Capillary: 98 mg/dL (ref 70–99)

## 2021-01-06 LAB — LACTIC ACID, PLASMA: Lactic Acid, Venous: 0.9 mmol/L (ref 0.5–1.9)

## 2021-01-06 LAB — GLUCOSE, CAPILLARY: Glucose-Capillary: 157 mg/dL — ABNORMAL HIGH (ref 70–99)

## 2021-01-06 LAB — SARS CORONAVIRUS 2 BY RT PCR (HOSPITAL ORDER, PERFORMED IN ~~LOC~~ HOSPITAL LAB): SARS Coronavirus 2: NEGATIVE

## 2021-01-06 LAB — MRSA PCR SCREENING: MRSA by PCR: NEGATIVE

## 2021-01-06 MED ORDER — INSULIN GLARGINE 100 UNIT/ML ~~LOC~~ SOLN
18.0000 [IU] | Freq: Every day | SUBCUTANEOUS | Status: DC
  Administered 2021-01-06 – 2021-01-08 (×3): 18 [IU] via SUBCUTANEOUS
  Filled 2021-01-06 (×4): qty 0.18

## 2021-01-06 MED ORDER — ACETAMINOPHEN 650 MG RE SUPP: 325.0000 mg | Freq: Four times a day (QID) | RECTAL | Status: DC | PRN

## 2021-01-06 MED ORDER — PIPERACILLIN-TAZOBACTAM 3.375 G IVPB 30 MIN
3.3750 g | Freq: Once | INTRAVENOUS | Status: AC
  Administered 2021-01-06: 3.375 g via INTRAVENOUS
  Filled 2021-01-06: qty 50

## 2021-01-06 MED ORDER — SODIUM CHLORIDE 0.9 % IV BOLUS
1000.0000 mL | Freq: Once | INTRAVENOUS | Status: AC
  Administered 2021-01-06: 1000 mL via INTRAVENOUS

## 2021-01-06 MED ORDER — SODIUM CHLORIDE 0.9 % IV SOLN
2.0000 g | Freq: Three times a day (TID) | INTRAVENOUS | Status: DC
  Administered 2021-01-06 – 2021-01-09 (×7): 2 g via INTRAVENOUS
  Filled 2021-01-06 (×10): qty 2

## 2021-01-06 MED ORDER — ATORVASTATIN CALCIUM 20 MG PO TABS
20.0000 mg | ORAL_TABLET | ORAL | Status: DC
  Administered 2021-01-09: 20 mg via ORAL
  Filled 2021-01-06: qty 1

## 2021-01-06 MED ORDER — ONDANSETRON HCL 4 MG PO TABS: 4.0000 mg | ORAL_TABLET | Freq: Four times a day (QID) | ORAL | Status: DC | PRN

## 2021-01-06 MED ORDER — MORPHINE SULFATE (PF) 2 MG/ML IV SOLN: 2.0000 mg | INTRAVENOUS | Status: DC | PRN

## 2021-01-06 MED ORDER — POVIDONE-IODINE 10 % EX SWAB: 2.0000 "application " | Freq: Once | CUTANEOUS | Status: DC

## 2021-01-06 MED ORDER — HEPARIN SODIUM (PORCINE) 5000 UNIT/ML IJ SOLN: 5000.0000 [IU] | Freq: Three times a day (TID) | INTRAMUSCULAR | Status: DC

## 2021-01-06 MED ORDER — INSULIN ASPART 100 UNIT/ML ~~LOC~~ SOLN
0.0000 [IU] | Freq: Three times a day (TID) | SUBCUTANEOUS | Status: DC
  Administered 2021-01-07: 3 [IU] via SUBCUTANEOUS
  Administered 2021-01-07: 11 [IU] via SUBCUTANEOUS
  Administered 2021-01-08: 8 [IU] via SUBCUTANEOUS
  Administered 2021-01-08: 3 [IU] via SUBCUTANEOUS
  Administered 2021-01-09: 2 [IU] via SUBCUTANEOUS
  Filled 2021-01-06 (×5): qty 1

## 2021-01-06 MED ORDER — CHLORHEXIDINE GLUCONATE 4 % EX LIQD: 60.0000 mL | Freq: Once | CUTANEOUS | Status: DC

## 2021-01-06 MED ORDER — OXYCODONE-ACETAMINOPHEN 5-325 MG PO TABS
1.0000 | ORAL_TABLET | Freq: Four times a day (QID) | ORAL | Status: DC | PRN
  Administered 2021-01-08 – 2021-01-09 (×4): 1 via ORAL
  Filled 2021-01-06 (×4): qty 1

## 2021-01-06 MED ORDER — VANCOMYCIN HCL 2000 MG/400ML IV SOLN
2000.0000 mg | Freq: Once | INTRAVENOUS | Status: AC
  Administered 2021-01-06: 2000 mg via INTRAVENOUS
  Filled 2021-01-06: qty 400

## 2021-01-06 MED ORDER — METFORMIN HCL 500 MG PO TABS: 1000.0000 mg | ORAL_TABLET | Freq: Two times a day (BID) | ORAL | Status: DC

## 2021-01-06 MED ORDER — VANCOMYCIN HCL 1500 MG/300ML IV SOLN
1500.0000 mg | Freq: Once | INTRAVENOUS | Status: DC
  Filled 2021-01-06: qty 300

## 2021-01-06 MED ORDER — VANCOMYCIN HCL 1500 MG/300ML IV SOLN
1500.0000 mg | Freq: Two times a day (BID) | INTRAVENOUS | Status: DC
  Administered 2021-01-07 – 2021-01-09 (×4): 1500 mg via INTRAVENOUS
  Filled 2021-01-06 (×6): qty 300

## 2021-01-06 MED ORDER — INSULIN DEGLUDEC 100 UNIT/ML ~~LOC~~ SOPN: 18.0000 [IU] | PEN_INJECTOR | Freq: Every day | SUBCUTANEOUS | Status: DC

## 2021-01-06 MED ORDER — LACTATED RINGERS IV SOLN: INTRAVENOUS | Status: AC

## 2021-01-06 MED ORDER — ACETAMINOPHEN 325 MG PO TABS: 325.0000 mg | ORAL_TABLET | Freq: Four times a day (QID) | ORAL | Status: DC | PRN

## 2021-01-06 MED ORDER — INSULIN ASPART 100 UNIT/ML ~~LOC~~ SOLN
0.0000 [IU] | Freq: Every day | SUBCUTANEOUS | Status: DC
  Administered 2021-01-07: 4 [IU] via SUBCUTANEOUS
  Administered 2021-01-08: 3 [IU] via SUBCUTANEOUS
  Filled 2021-01-06 (×2): qty 1

## 2021-01-06 MED ORDER — ONDANSETRON HCL 4 MG/2ML IJ SOLN: 4.0000 mg | Freq: Four times a day (QID) | INTRAMUSCULAR | Status: DC | PRN

## 2021-01-06 NOTE — ED Notes (Signed)
Pt transported to MRI 

## 2021-01-06 NOTE — ED Triage Notes (Signed)
Left foot great toe swelling and pain. Pt with recent hx of first and 2nd toe infection that healed with wound center. States that he has been having problems with left great toe X 2 weeks; worsening today.  Swelling, redness and clear fluid leaking from toe.

## 2021-01-06 NOTE — ED Triage Notes (Signed)
Patient complains of left toe wound that has been ongoing for 8 months. States that he has been seeing wound clinic and had healed up. States that around 13 days ago he started to noticed a blister forming on his left big toe. States that he sees Dr. Sunday Spillers) on Tuesday.

## 2021-01-06 NOTE — Discharge Instructions (Addendum)
Go directly to the hospital. ° °Take care ° °Dr. Jordy Verba  °

## 2021-01-06 NOTE — ED Provider Notes (Signed)
[facility] Emergency Department Provider Note   ____________________________________________   Event Date/Time   First MD Initiated Contact with Patient 01/06/21 1500     (approximate)  I have reviewed the triage vital signs and the nursing notes.   HISTORY  Chief Complaint Wound Infection    HPI [name] is a 60 y.o. male patient is a 60 year old male with a past medical history of type 2 diabetes, hyperlipidemia, and recurrent osteomyelitis of the left foot who presents for worsening swelling and blister over the left great toe.  Patient states that he has had a chronic wound over this toe for approximately the last year and it seems to have healed up until approximately 5 days prior to arrival he noticed worsening blister over the anterior aspect of his foot that has worsened since onset.  Patient only describes 2/10, nonradiating, aching left great toe pain that is worse with ambulation.  Patient currently denies any vision changes, tinnitus, difficulty speaking, facial droop, sore throat, chest pain, shortness of breath, abdominal pain, nausea/vomiting/diarrhea, dysuria, or weakness/numbness/paresthesias in any extremity         Past Medical History:  Diagnosis Date  . Arthritis    hands  . COVID-19 12/29/2019  . Diabetes mellitus without complication ([place])    type 2  . GERD (gastroesophageal reflux disease)   . Hypercholesterolemia   . Osteomyelitis of left foot [facility])     Patient Active Problem List   Diagnosis Date Noted  . Status post amputation of lesser toe of right foot ([place]) 10/20/2020  . Cellulitis of right lower extremity 10/05/2020  . Sepsis ([place]) 10/05/2020  . Acute prerenal azotemia 10/05/2020  . PAD (peripheral artery disease) ([place]) 07/03/2020  . Type 2 diabetes mellitus with diabetic neuropathy, with long-term current use of insulin ([place]) 05/23/2020  . Diabetic ulcer of toe of right foot associated with type 2 diabetes  mellitus ([place]) 05/23/2020  . Hyperlipidemia associated with type 2 diabetes mellitus ([place]) 02/08/2020  . ED (erectile dysfunction) 12/28/2018  . Obesity 12/24/2018    Past Surgical History:  Procedure Laterality Date  . ADENOIDECTOMY    . AMPUTATION Right 10/07/2020   Procedure: AMPUTATION RAY-Right [place];  Surgeon: Caroline More, DPM;  Location: [facility] ORS;  Service: Podiatry;  Laterality: Right;  . CATARACT EXTRACTION W/PHACO Right 03/03/2020   Procedure: CATARACT EXTRACTION PHACO AND INTRAOCULAR LENS PLACEMENT ([place]) RIGHT DIABETIC VISION BLUE;  Surgeon: Marchia Meiers, MD;  Location: [place];  Service: Ophthalmology;  Laterality: Right;  COVID ( + ) 12-29-2019  30.97 02:27.7  . CATARACT EXTRACTION W/PHACO Left 06/23/2020   Procedure: CATARACT EXTRACTION PHACO AND INTRAOCULAR LENS PLACEMENT ([facility]) LEFT DIABETIC VISION BLUE 5.14  00:34.7;  Surgeon: Marchia Meiers, MD;  Location: [place];  Service: Ophthalmology;  Laterality: Left;  DIABETIC  . ELBOW FRACTURE SURGERY Right   . EXTERNAL EAR SURGERY    . IRRIGATION AND DEBRIDEMENT FOOT Right 10/07/2020   Procedure: IRRIGATION AND DEBRIDEMENT FOOT;  Surgeon: Caroline More, DPM;  Location: [facility] OR[facility];  Service: Podiatry;  Laterality: Right;    Prior to Admission medications   Medication Sig Start Date End Date Taking? Authorizing Provider  Accu-Chek FastClix Lancets MISC USE LANCET(S) TO CHECK GLUCOSE UP TO 4 TIMES DAILY 11/17/20   Johnson, Megan P, DO  ACCU-CHEK GUIDE test strip USE UP TO 4 TIMES DAILY AS DIRECTED 10/25/20   Johnson, Megan P, DO  Ascorbic Acid (VITAMIN C CR) 1500 MG TBCR Take 1,500 mg  by mouth daily.    [provider]  aspirin EC 81 MG tablet Take 81 mg by mouth daily.    [provider]  atorvastatin (LIPITOR) 20 MG tablet Take 1 tablet (20 mg total) by mouth every other day. 10/20/20   Johnson, Megan P, DO  blood glucose meter kit and supplies Dispense based on patient and insurance  preference. Use up to four times daily as directed. (FOR ICD-10 E10.9, E11.9). 06/08/19   Volney American, PA-C  Cinnamon 500 MG capsule Take 500 mg by mouth daily.    [provider]  Garlic 0354 MG CAPS Take 1,000 mg by mouth daily.    [provider]  glipiZIDE (GLUCOTROL) 5 MG tablet Take 1 tablet (5 mg total) by mouth 2 (two) times daily before a meal. 10/20/20   Johnson, Megan P, DO  ibuprofen (ADVIL) 400 MG tablet Take 1 tablet (400 mg total) by mouth every 6 (six) hours as needed for moderate pain. 10/10/20   Bonnielee Haff, MD  insulin degludec (TRESIBA FLEXTOUCH) 100 UNIT/ML FlexTouch Pen Inject 18 Units into the skin daily. 10/20/20   Johnson, Megan P, DO  Insulin Pen Needle (PEN NEEDLES) 32G X 6 MM MISC 1 Units by Does not apply route daily as needed. 10/20/20   Johnson, Megan P, DO  metFORMIN (GLUCOPHAGE) 1000 MG tablet Take 1 tablet (1,000 mg total) by mouth 2 (two) times daily with a meal. 10/20/20   Johnson, Megan P, DO  oxyCODONE-acetaminophen (PERCOCET/ROXICET) 5-325 MG tablet Take 1 tablet by mouth every 8 (eight) hours as needed for severe pain. 10/10/20   Bonnielee Haff, MD    Allergies Patient has no known allergies.  Family History  Problem Relation Age of Onset  . Dementia Mother   . Hypertension Mother   . Diabetes Maternal Grandmother   . Hypertension Maternal Grandmother   . Cancer Neg Hx   . COPD Neg Hx   . Heart disease Neg Hx   . Stroke Neg Hx     Social History Social History   Tobacco Use  . Smoking status: Never Smoker  . Smokeless tobacco: Never Used  Vaping Use  . Vaping Use: Never used  Substance Use Topics  . Alcohol use: Not Currently    Comment: occasionally  . Drug use: No    Review of Systems Constitutional: No fever/chills Eyes: No visual changes. ENT: No sore throat. Cardiovascular: Denies chest pain. Respiratory: Denies shortness of breath. Gastrointestinal: No abdominal pain.  No nausea, no vomiting.   No diarrhea. Genitourinary: Negative for dysuria. Musculoskeletal: Restless left great toe pain with associated ulcer Skin: Negative for rash. Neurological: Negative for headaches, weakness/numbness/paresthesias in any extremity Psychiatric: Negative for suicidal ideation/homicidal ideation   ____________________________________________   PHYSICAL EXAM:  VITAL SIGNS: ED Triage Vitals  Enc Vitals Group     BP 01/06/21 1413 129/73     Pulse Rate 01/06/21 1413 (!) 105     Resp 01/06/21 1413 16     Temp 01/06/21 1413 99.5 F (37.5 C)     Temp Source 01/06/21 1413 Oral     SpO2 01/06/21 1413 100 %     Weight 01/06/21 1414 233 lb 11 oz (106 kg)     Height 01/06/21 1414 6' (1.829 m)     Head Circumference --      Peak Flow --      Pain Score 01/06/21 1414 2     Pain Loc --  Pain Edu? --      Excl. in [place]? --    Constitutional: Alert and oriented. Well appearing and in no acute distress. Eyes: Conjunctivae are normal. PERRL. Head: Atraumatic. Nose: No congestion/rhinnorhea. Mouth/Throat: Mucous membranes are moist. Neck: No stridor Cardiovascular: Grossly normal heart sounds.  Good peripheral circulation. Respiratory: Normal respiratory effort.  No retractions. Gastrointestinal: Soft and nontender. No distention. Musculoskeletal: No obvious deformities Neurologic:  Normal speech and language. No gross focal neurologic deficits are appreciated. Skin:  Skin is warm and dry.  Left great toe with fusiform swelling and ulceration to the anterior medial aspect of the toe joint with surrounding erythema, discoloration, and tenderness to palpation Psychiatric: Mood and affect are normal. Speech and behavior are normal.  ____________________________________________   LABS (all labs ordered are listed, but only abnormal results are displayed)  Labs Reviewed  COMPREHENSIVE METABOLIC PANEL - Abnormal; Notable for the following components:      Result Value   BUN 27 (*)    All  other components within normal limits  CBC WITH DIFFERENTIAL/PLATELET - Abnormal; Notable for the following components:   WBC 15.9 (*)    RBC 3.68 (*)    Hemoglobin 11.8 (*)    HCT 33.9 (*)    Neutro Abs 13.4 (*)    Monocytes Absolute 1.1 (*)    Abs Immature Granulocytes 0.08 (*)    All other components within normal limits  SARS CORONAVIRUS 2 BY RT PCR (HOSPITAL ORDER, [place] [facility])  CULTURE, BLOOD (ROUTINE X 2)  CULTURE, BLOOD (ROUTINE X 2)  AEROBIC CULTURE (SUPERFICIAL SPECIMEN)  AEROBIC/ANAEROBIC CULTURE (SURGICAL/DEEP WOUND)  LACTIC ACID, PLASMA  LACTIC ACID, PLASMA  URINALYSIS, COMPLETE (UACMP) WITH MICROSCOPIC  CBG MONITORING, ED    RADIOLOGY  ED MD interpretation: X-ray of the left great toe shows findings consistent with osteomyelitis  Official radiology report(s): DG Toe Great Left  Result Date: 01/06/2021 CLINICAL DATA:  Pt with recent hx of first and 2nd toe infection that healed with wound center. States that he has been having problems with left great toe X 2 weeks; worsening today. Swelling, redness and clear fluid leaking from toe. Patient complains of left toe wound that has been ongoing for 8 months. States that he has been seeing wound clinic and had healed up. States that around 13 days ago he started to noticed a blister forming on his left big toe EXAM: LEFT GREAT TOE COMPARISON:  06/07/2020 FINDINGS: There is bone resorption along the medial margin of the distal aspect of the proximal phalanx. There is also a apparent fracture along the plantar base of the distal phalanx with associated focal osteopenia. Joint is normally aligned. There is surrounding soft tissue swelling with evidence of a soft tissue ulcer. There is a small amount of soft tissue air along the lateral aspect of the toe at the level of the IP joint. IMPRESSION: 1. Findings consistent with osteomyelitis involving the medial head of the proximal phalanx as well as the plantar  base of the distal phalanx. The plantar base of the distal phalanx also shows a fracture, visualized on the lateral view only. There is adjacent soft tissue swelling and air. Consider septic arthritis of the IP joint in the proper clinical setting. Electronically Signed   By: Lajean Manes M.D.   On: 01/06/2021 15:07    ____________________________________________   PROCEDURES  Procedure(s) performed (including Critical Care):  .1-3 Lead EKG Interpretation Performed by: Naaman Plummer, MD Authorized by: Cheri Fowler,  Vista Lawman, MD     Interpretation: abnormal     ECG rate:  104   ECG rate assessment: tachycardic     Rhythm: sinus tachycardia     Ectopy: none     Conduction: normal       ____________________________________________   INITIAL IMPRESSION / ASSESSMENT AND PLAN / ED COURSE  As part of my medical decision making, I reviewed the following data within the [place] notes reviewed and incorporated, Labs reviewed, Old chart reviewed, Radiograph reviewed and Notes from prior ED visits reviewed and incorporated     Patient is a 60 year old male with a stated past medical history of type 2 diabetes and chronic left great toe ulcer who presents for worsening pain, ulceration, surrounding erythema.  Patient has a x-ray of the left great toe concerning for worsening osteomyelitis   Given History, Exam, and Workup I have low suspicion for Necrotizing Fasciitis, Abscess, DVT or other emergent problem as a cause for this presentation.  Interventions: Vancomycin 1g IV Disposition: Admit for continued monitoring, workup and likely inpatient PICC line placement.      ____________________________________________   FINAL CLINICAL IMPRESSION(S) / ED DIAGNOSES  Final diagnoses:  Osteomyelitis of great toe of left foot (HCC)  Sepsis without acute organ dysfunction, due to unspecified organism [facility])     ED Discharge Orders    None       Note:  This  document was prepared using Dragon voice recognition software and may include unintentional dictation errors.   Naaman Plummer, MD 01/06/21 [phone]

## 2021-01-06 NOTE — ED Notes (Signed)
Pt transported to XRAY °

## 2021-01-06 NOTE — H&P (Signed)
History and Physical   [name] FUX:[id number] DOB: 05-20-1961 DOA: 01/06/2021  PCP: Dr. Park Liter at [facility] Specialists: Dr. Luana Shu podiatry Patient coming from: Wound clinic  I have personally briefly reviewed patient's old medical records in [place].  Chief Concern: Left great toe wound  HPI: [name] is a 60 y.o. male with medical history significant for IDDM2, history of diabetic foot ulcer, obesity, presents emergency department for chief concerns of worsening wound infection of the right great toe.  Patient was advised by wound clinic to present to the emergency department for further evaluation.  He states that he has had this wound chronically for about 8 months.  He was seen by wound clinic.  He states that initially was his first and second toe/foot.  With management from wound clinic, his second toe healed up.  His great toe started improving and he thought healed.  However in the last few weeks it has had some bleeding and purulence.  At home he denies fever, nausea, shortness of breath, chest pain, vomiting.  Endorses dull and sharp pain at his great toe.  He endorses purulence and some bleeding.  He endorses warmth and edema.  Social History: lives at home by himself. He never smoked tobacco. He formerly consumed etoh, however quit about 20 years ago after diagnosis of diabetes mellitus. He denies recreational drug use. 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   Vaccinations: he is not vaccinated for COVID.   ROS: Constitutional: no weight change, no fever ENT/Mouth: no sore throat, no rhinorrhea Eyes: no eye pain, no vision changes Cardiovascular: no chest pain, no dyspnea,  no edema, no palpitations Respiratory: no cough, no sputum, no wheezing Gastrointestinal: no nausea, no vomiting, no diarrhea, no constipation Genitourinary: no urinary incontinence, no dysuria, no hematuria Musculoskeletal: no arthralgias, no  myalgias Skin: Left great toe wound present on admission Neuro: + weakness, no loss of consciousness, no syncope Psych: no anxiety, no depression, + decrease appetite Heme/Lymph: no bruising, no bleeding  ED Course: Discussed with ED provider, patient requiring hospitalization due to osteomyelitis of the left great toe.  Assessment/Plan  Principal Problem:   Osteomyelitis (HCC) Active Problems:   Obesity   Hyperlipidemia associated with type 2 diabetes mellitus ([place])   Type 2 diabetes mellitus with diabetic neuropathy, with long-term current use of insulin (HCC)   Diabetic ulcer of toe of right foot associated with type 2 diabetes mellitus (HCC)   PAD (peripheral artery disease) (HCC)   Cellulitis of right lower extremity   Status post amputation of lesser toe of right foot ([place])   Osteomyelitis of the left great toe-present on admission -Complicated by insulin-dependent diabetes mellitus -Vancomycin and cefepime per pharmacy for osteomyelitis -Status post vancomycin and Zosyn per ED provider -Checking A1c -Dr. Luana Shu, podiatry has been consulted -Vascular has been consulted at the request of podiatry -Status post normal saline 1 L bolus per ED providers -Lactated Ringer's IVF 125 cc/h for 12 hours  Insulin-dependent diabetes mellitus -Resumed home insulin -Insulin sliding scale with at bedtime coverage -Checking hemoglobin A1c  Chart reviewed.   07/04/2020: Vascular ultrasound ABI with and without TBI read as right resting ankle brachial index is within normal limits.  No evidence of significant right lower extremity arterial disease.  Right toe brachial index is normal.  Left ankle brachial index is within normal range.  No evidence of significant left lower extremity arterial disease.  Left great toe was wrapped unable  to obtain the TBI but waveform appeared to be normal.  DVT prophylaxis: SCDs in anticipation of OR time on 01/07/2019 Code Status: Full code Diet: Heart  healthy/carb modified, n.p.o. at midnight Family Communication: No Disposition Plan: Pending clinical course Consults called: Podiatry and vascular Admission status: Inpatient  Past Medical History:  Diagnosis Date  . Arthritis    hands  . COVID-19 12/29/2019  . Diabetes mellitus without complication ([place])    type 2  . GERD (gastroesophageal reflux disease)   . Hypercholesterolemia   . Osteomyelitis of left foot [facility])    Past Surgical History:  Procedure Laterality Date  . ADENOIDECTOMY    . AMPUTATION Right 10/07/2020   Procedure: AMPUTATION RAY-Right [place];  Surgeon: Caroline More, DPM;  Location: [facility];  Service: Podiatry;  Laterality: Right;  . CATARACT EXTRACTION W/PHACO Right 03/03/2020   Procedure: CATARACT EXTRACTION PHACO AND INTRAOCULAR LENS PLACEMENT ([place]) RIGHT DIABETIC VISION BLUE;  Surgeon: Marchia Meiers, MD;  Location: [place];  Service: Ophthalmology;  Laterality: Right;  COVID ( + ) 12-29-2019  30.97 02:27.7  . CATARACT EXTRACTION W/PHACO Left 06/23/2020   Procedure: CATARACT EXTRACTION PHACO AND INTRAOCULAR LENS PLACEMENT ([facility]) LEFT DIABETIC VISION BLUE 5.14  00:34.7;  Surgeon: Marchia Meiers, MD;  Location: [place];  Service: Ophthalmology;  Laterality: Left;  DIABETIC  . ELBOW FRACTURE SURGERY Right   . EXTERNAL EAR SURGERY    . IRRIGATION AND DEBRIDEMENT FOOT Right 10/07/2020   Procedure: IRRIGATION AND DEBRIDEMENT FOOT;  Surgeon: Caroline More, DPM;  Location: [facility];  Service: Podiatry;  Laterality: Right;   Social History:  reports that he has never smoked. He has never used smokeless tobacco. He reports previous alcohol use. He reports that he does not use drugs.  No Known Allergies Family History  Problem Relation Age of Onset  . Dementia Mother   . Hypertension Mother   . Diabetes Maternal Grandmother   . Hypertension Maternal Grandmother   . Cancer Neg Hx   . COPD Neg Hx   . Heart disease Neg Hx   . Stroke Neg Hx     Family history: Family history reviewed and not pertinent  Prior to Admission medications   Medication Sig Start Date End Date Taking? Authorizing Provider  Accu-Chek FastClix Lancets MISC USE LANCET(S) TO CHECK GLUCOSE UP TO 4 TIMES DAILY 11/17/20   Johnson, Megan P, DO  ACCU-CHEK GUIDE test strip USE UP TO 4 TIMES DAILY AS DIRECTED 10/25/20   Johnson, Megan P, DO  Ascorbic Acid (VITAMIN C CR) 1500 MG TBCR Take 1,500 mg by mouth daily.    [provider]  aspirin EC 81 MG tablet Take 81 mg by mouth daily.    [provider]  atorvastatin (LIPITOR) 20 MG tablet Take 1 tablet (20 mg total) by mouth every other day. 10/20/20   Johnson, Megan P, DO  blood glucose meter kit and supplies Dispense based on patient and insurance preference. Use up to four times daily as directed. (FOR ICD-10 E10.9, E11.9). 06/08/19   Volney American, PA-C  Cinnamon 500 MG capsule Take 500 mg by mouth daily.    [provider]  Garlic 9622 MG CAPS Take 1,000 mg by mouth daily.    [provider]  glipiZIDE (GLUCOTROL) 5 MG tablet Take 1 tablet (5 mg total) by mouth 2 (two) times daily before a meal. 10/20/20   Johnson, Megan P, DO  ibuprofen (ADVIL) 400 MG tablet Take 1 tablet (400 mg  total) by mouth every 6 (six) hours as needed for moderate pain. 10/10/20   Bonnielee Haff, MD  insulin degludec (TRESIBA FLEXTOUCH) 100 UNIT/ML FlexTouch Pen Inject 18 Units into the skin daily. 10/20/20   Johnson, Megan P, DO  Insulin Pen Needle (PEN NEEDLES) 32G X 6 MM MISC 1 Units by Does not apply route daily as needed. 10/20/20   Johnson, Megan P, DO  metFORMIN (GLUCOPHAGE) 1000 MG tablet Take 1 tablet (1,000 mg total) by mouth 2 (two) times daily with a meal. 10/20/20   Johnson, Megan P, DO  oxyCODONE-acetaminophen (PERCOCET/ROXICET) 5-325 MG tablet Take 1 tablet by mouth every 8 (eight) hours as needed for severe pain. 10/10/20   Bonnielee Haff, MD   Physical Exam: Vitals:   01/06/21  1413 01/06/21 1414 01/06/21 1656 01/06/21 2027  BP: 129/73  (!) 160/76 137/73  Pulse: (!) 105  (!) 101 (!) 110  Resp: '16  16 18  ' Temp: 99.5 F (37.5 C)   98.3 F (36.8 C)  TempSrc: Oral   Oral  SpO2: 100%  99% 96%  Weight:  106 kg    Height:  6' (1.829 m)     Constitutional: appears age-appropriate, NAD, calm, comfortable Eyes: PERRL, lids and conjunctivae normal ENMT: Mucous membranes are moist. Posterior pharynx clear of any exudate or lesions. Age-appropriate dentition. Hearing appropriate Neck: normal, supple, no masses, no thyromegaly Respiratory: clear to auscultation bilaterally, no wheezing, no crackles. Normal respiratory effort. No accessory muscle use.  Cardiovascular: Regular rate and rhythm, no murmurs / rubs / gallops. No extremity edema. 2+ pedal pulses. No carotid bruits.  Abdomen: Obese, no tenderness, no masses palpated, no hepatosplenomegaly. Bowel sounds positive.  Musculoskeletal: no clubbing / cyanosis. No joint deformity upper and lower extremities. Good ROM, no contractures, no atrophy. Normal muscle tone.  Skin: Left toe wound with purulence and surrounding edema and erythema to the midfoot Neurologic: Sensation intact. Strength 5/5 in all 4.  Psychiatric: Normal judgment and insight. Alert and oriented x 3. Normal mood.   EKG: Not indicated  Imaging on Admission: I personally reviewed and I agree with radiologist reading as below.  MR FOOT LEFT WO CONTRAST  Result Date: 01/06/2021 CLINICAL DATA:  Worsening blistering along the great toe. Chronic wound in this vicinity. EXAM: MRI OF THE LEFT FOOT WITHOUT CONTRAST TECHNIQUE: Multiplanar, multisequence MR imaging of the left foot was performed. No intravenous contrast was administered. COMPARISON:  Radiographs from 06/07/2020 FINDINGS: Bones/Joint/Cartilage Diffuse abnormal osseous edema in the proximal and distal phalanges of the great toe suspicious for osteomyelitis, with joint effusion of the interphalangeal  joint of the great toe which could be septic and which may be draining out to the dorsal lateral cutaneous surface on images 10 through 11 of series 6. There is also a dorsal joint effusion of the first MTP joint. Valgus deformity at the interphalangeal joint of the great toe with extensive subcutaneous edema and a small amount of gas in the soft tissues suspicious for potential tissue necrosis. Abnormal edema signal is present in the phalanges of the second toe potentially with some deformity of the proximal phalanx, likewise suspicious for possible osteomyelitis in this setting. Equivocal edema signal in the distal phalanx of the third toe, with accentuated T2 signal but not accompanied by reduced T1 signal, hence equivocal for osteomyelitis. Patchy marrow edema in the medial cuneiform, navicular, and along the base of the second metatarsal, probably degenerative. Ligaments The Lisfranc ligament appears grossly intact on image 11 of series 13  although there are regional degenerative findings and possibly mild erosive findings in this area. Muscles and Tendons Poor definition of the distal flexor hallucis longus tendon. Diffuse regional muscular edema, possibly neurogenic or from myositis. Soft tissues Dorsal subcutaneous edema tracking into the toes, likely from cellulitis. IMPRESSION: 1. Osteomyelitis of the great toe, second toe, and possibly distal phalanx of the third toe. Probable septic joint of the interphalangeal joint of the great toe, probably draining to the cutaneous surface, with some gas in the adjacent soft tissues and extensive regional cellulitis. There is also an effusion of the first MTP joint, indeterminate for septic joint. 2. Subcutaneous edema favoring cellulitis in the foot. Edema within all visualized muscular structures, possibly neurogenic or from myositis. 3. Degenerative findings in the midfoot and along parts of the Lisfranc joint. Electronically Signed   By: Van Clines M.D.    On: 01/06/2021 18:53   DG Toe Great Left  Result Date: 01/06/2021 CLINICAL DATA:  Pt with recent hx of first and 2nd toe infection that healed with wound center. States that he has been having problems with left great toe X 2 weeks; worsening today. Swelling, redness and clear fluid leaking from toe. Patient complains of left toe wound that has been ongoing for 8 months. States that he has been seeing wound clinic and had healed up. States that around 13 days ago he started to noticed a blister forming on his left big toe EXAM: LEFT GREAT TOE COMPARISON:  06/07/2020 FINDINGS: There is bone resorption along the medial margin of the distal aspect of the proximal phalanx. There is also a apparent fracture along the plantar base of the distal phalanx with associated focal osteopenia. Joint is normally aligned. There is surrounding soft tissue swelling with evidence of a soft tissue ulcer. There is a small amount of soft tissue air along the lateral aspect of the toe at the level of the IP joint. IMPRESSION: 1. Findings consistent with osteomyelitis involving the medial head of the proximal phalanx as well as the plantar base of the distal phalanx. The plantar base of the distal phalanx also shows a fracture, visualized on the lateral view only. There is adjacent soft tissue swelling and air. Consider septic arthritis of the IP joint in the proper clinical setting. Electronically Signed   By: Lajean Manes M.D.   On: 01/06/2021 15:07   Labs on Admission: I have personally reviewed following labs  CBC: Recent Labs  Lab 01/06/21 1416  WBC 15.9*  NEUTROABS 13.4*  HGB 11.8*  HCT 33.9*  MCV 92.1  PLT 275   Basic Metabolic Panel: Recent Labs  Lab 01/06/21 1416  NA 135  K 3.8  CL 99  CO2 24  GLUCOSE 77  BUN 27*  CREATININE 1.02  CALCIUM 9.3   GFR: Estimated Creatinine Clearance: 100.6 mL/min (by C-G formula based on SCr of 1.02 mg/dL).  Liver Function Tests: Recent Labs  Lab 01/06/21 1416   AST 23  ALT 19  ALKPHOS 49  BILITOT 0.9  PROT 7.7  ALBUMIN 3.8   CBG: Recent Labs  Lab 01/06/21 1418 01/06/21 1909 01/06/21 2118  GLUCAP 72 98 157*   Urine analysis:    Component Value Date/Time   COLORURINE YELLOW (A) 10/05/2020 1720   APPEARANCEUR Clear 10/20/2020 1334   LABSPEC 1.020 10/05/2020 1720   PHURINE 5.0 10/05/2020 1720   GLUCOSEU Negative 10/20/2020 1334   HGBUR NEGATIVE 10/05/2020 1720   BILIRUBINUR Negative 10/20/2020 1334   KETONESUR 5 (A)  10/05/2020 1720   PROTEINUR 2+ (A) 10/20/2020 1334   PROTEINUR 100 (A) 10/05/2020 1720   NITRITE Negative 10/20/2020 1334   NITRITE NEGATIVE 10/05/2020 1720   LEUKOCYTESUR Negative 10/20/2020 1334   LEUKOCYTESUR NEGATIVE 10/05/2020 1720   Serenity Fortner N Kristeen Lantz D.O. [facility]  If 7PM-7AM, please contact overnight-coverage provider If 7AM-7PM, please contact day coverage provider [email or web address]  01/06/2021, 9:59 PM

## 2021-01-06 NOTE — Progress Notes (Signed)
PHARMACY -  BRIEF ANTIBIOTIC NOTE   Pharmacy has received consult(s) for vancomycin from an ED provider.  The patient's profile has been reviewed for ht/wt/allergies/indication/available labs.    One time order(s) placed for vancomycin 2000 mg IV  Further antibiotics/pharmacy consults should be ordered by admitting physician if indicated.                       Thank you,  Pricilla Riffle, PharmD 01/06/2021  3:31 PM

## 2021-01-06 NOTE — ED Notes (Signed)
Pt ambulatory to bathroom without difficulty.  

## 2021-01-06 NOTE — ED Provider Notes (Signed)
[facility]    CSN: [id number] Arrival date & time: 01/06/21  1223      History   Chief Complaint Chief Complaint  Patient presents with  . Toe Pain   HPI  60 year old male presents with the above complaint.  Patient states that he has had ongoing issues with a left toe wound.  Recently it healed up.  Approximately 13 days ago he noticed a blister on his toe and it has progressed since then.  He states that it has been particularly worse today.  His pain is 2/10 in severity.  He is toe is severely swollen and open wounds are noted.  No documented fever.  Patient is tachycardic.  No other reported symptoms.  Past Medical History:  Diagnosis Date  . Arthritis    hands  . COVID-19 12/29/2019  . Diabetes mellitus without complication ([place])    type 2  . GERD (gastroesophageal reflux disease)   . Hypercholesterolemia   . Osteomyelitis of left foot [facility])     Patient Active Problem List   Diagnosis Date Noted  . Status post amputation of lesser toe of right foot ([place]) 10/20/2020  . Cellulitis of right lower extremity 10/05/2020  . Sepsis ([place]) 10/05/2020  . Acute prerenal azotemia 10/05/2020  . PAD (peripheral artery disease) ([employer]) 07/03/2020  . Type 2 diabetes mellitus with diabetic neuropathy, with long-term current use of insulin ([place]) 05/23/2020  . Diabetic ulcer of toe of right foot associated with type 2 diabetes mellitus ([place]) 05/23/2020  . Hyperlipidemia associated with type 2 diabetes mellitus ([place]) 02/08/2020  . ED (erectile dysfunction) 12/28/2018  . Obesity 12/24/2018    Past Surgical History:  Procedure Laterality Date  . ADENOIDECTOMY    . AMPUTATION Right 10/07/2020   Procedure: AMPUTATION RAY-Right [place];  Surgeon: Caroline More, DPM;  Location: [facility];  Service: Podiatry;  Laterality: Right;  . CATARACT EXTRACTION W/PHACO Right 03/03/2020   Procedure: CATARACT EXTRACTION PHACO AND INTRAOCULAR LENS PLACEMENT ([place]) RIGHT DIABETIC VISION  BLUE;  Surgeon: Marchia Meiers, MD;  Location: [place];  Service: Ophthalmology;  Laterality: Right;  COVID ( + ) 12-29-2019  30.97 02:27.7  . CATARACT EXTRACTION W/PHACO Left 06/23/2020   Procedure: CATARACT EXTRACTION PHACO AND INTRAOCULAR LENS PLACEMENT ([facility]) LEFT DIABETIC VISION BLUE 5.14  00:34.7;  Surgeon: Marchia Meiers, MD;  Location: [place];  Service: Ophthalmology;  Laterality: Left;  DIABETIC  . ELBOW FRACTURE SURGERY Right   . EXTERNAL EAR SURGERY    . IRRIGATION AND DEBRIDEMENT FOOT Right 10/07/2020   Procedure: IRRIGATION AND DEBRIDEMENT FOOT;  Surgeon: Caroline More, DPM;  Location: [facility];  Service: Podiatry;  Laterality: Right;       Home Medications    Prior to Admission medications   Medication Sig Start Date End Date Taking? Authorizing Provider  Accu-Chek FastClix Lancets MISC USE LANCET(S) TO CHECK GLUCOSE UP TO 4 TIMES DAILY 11/17/20  Yes Johnson, Megan P, DO  ACCU-CHEK GUIDE test strip USE UP TO 4 TIMES DAILY AS DIRECTED 10/25/20  Yes Johnson, Megan P, DO  Ascorbic Acid (VITAMIN C CR) 1500 MG TBCR Take 1,500 mg by mouth daily.   Yes [provider]  aspirin EC 81 MG tablet Take 81 mg by mouth daily.   Yes [provider]  atorvastatin (LIPITOR) 20 MG tablet Take 1 tablet (20 mg total) by mouth every other day. 10/20/20  Yes Johnson, Megan P, DO  blood glucose meter kit and supplies Dispense based on patient  and insurance preference. Use up to four times daily as directed. (FOR ICD-10 E10.9, E11.9). 06/08/19  Yes Volney American, PA-C  Cinnamon 500 MG capsule Take 500 mg by mouth daily.   Yes [provider]  Garlic 5956 MG CAPS Take 1,000 mg by mouth daily.   Yes [provider]  glipiZIDE (GLUCOTROL) 5 MG tablet Take 1 tablet (5 mg total) by mouth 2 (two) times daily before a meal. 10/20/20  Yes Johnson, Megan P, DO  ibuprofen (ADVIL) 400 MG tablet Take 1 tablet (400 mg total) by mouth every 6 (six)  hours as needed for moderate pain. 10/10/20  Yes Bonnielee Haff, MD  insulin degludec (TRESIBA FLEXTOUCH) 100 UNIT/ML FlexTouch Pen Inject 18 Units into the skin daily. 10/20/20  Yes Johnson, Megan P, DO  Insulin Pen Needle (PEN NEEDLES) 32G X 6 MM MISC 1 Units by Does not apply route daily as needed. 10/20/20  Yes Johnson, Megan P, DO  metFORMIN (GLUCOPHAGE) 1000 MG tablet Take 1 tablet (1,000 mg total) by mouth 2 (two) times daily with a meal. 10/20/20  Yes Johnson, Megan P, DO  oxyCODONE-acetaminophen (PERCOCET/ROXICET) 5-325 MG tablet Take 1 tablet by mouth every 8 (eight) hours as needed for severe pain. 10/10/20  Yes Bonnielee Haff, MD    Family History Family History  Problem Relation Age of Onset  . Dementia Mother   . Hypertension Mother   . Diabetes Maternal Grandmother   . Hypertension Maternal Grandmother   . Cancer Neg Hx   . COPD Neg Hx   . Heart disease Neg Hx   . Stroke Neg Hx     Social History Social History   Tobacco Use  . Smoking status: Never Smoker  . Smokeless tobacco: Never Used  Vaping Use  . Vaping Use: Never used  Substance Use Topics  . Alcohol use: Not Currently    Comment: occasionally  . Drug use: No     Allergies   Patient has no active allergies.   Review of Systems Review of Systems  Constitutional: Negative for fever.  Skin: Positive for wound.     Physical Exam Triage Vital Signs ED Triage Vitals  Enc Vitals Group     BP 01/06/21 1257 139/76     Pulse Rate 01/06/21 1257 (!) 102     Resp 01/06/21 1257 18     Temp 01/06/21 1257 98.2 F (36.8 C)     Temp Source 01/06/21 1257 Oral     SpO2 01/06/21 1257 100 %     Weight 01/06/21 1253 235 lb (106.6 kg)     Height 01/06/21 1253 6' (1.829 m)     Head Circumference --      Peak Flow --      Pain Score 01/06/21 1253 2     Pain Loc --      Pain Edu? --      Excl. in [place]? --    Updated Vital Signs BP 139/76 (BP Location: Left Arm)   Pulse (!) 102   Temp 98.2 F (36.8 C)  (Oral)   Resp 18   Ht 6' (1.829 m)   Wt 106.6 kg   SpO2 100%   BMI 31.87 kg/m   Visual Acuity Right Eye Distance:   Left Eye Distance:   Bilateral Distance:    Right Eye Near:   Left Eye Near:    Bilateral Near:     Physical Exam Constitutional:      General: He is not  in acute distress.    Appearance: He is obese. He is not ill-appearing.  Eyes:     General:        Right eye: No discharge.        Left eye: No discharge.     Conjunctiva/sclera: Conjunctivae normal.  Pulmonary:     Effort: Pulmonary effort is normal. No respiratory distress.  Feet:     Comments: Left foot with 2+ dorsalis pedis and posterior tibial pulses. Patient has 2 open wounds to the left great toe.  Left great toe is very edematous.  Color change noted.  Erythema extends proximally.  See picture. Neurological:     Mental Status: He is alert.  Psychiatric:        Mood and Affect: Mood normal.        Behavior: Behavior normal.        UC Treatments / Results  Labs (all labs ordered are listed, but only abnormal results are displayed) Labs Reviewed - No data to display  EKG   Radiology No results found.  Procedures Procedures (including critical care time)  Medications Ordered in UC Medications - No data to display  Initial Impression / Assessment and Plan / UC Course  I have reviewed the triage vital signs and the nursing notes.  Pertinent labs & imaging results that were available during my care of the patient were reviewed by me and considered in my medical decision making (see chart for details).    60 year old male presents with a diabetic foot infection.  I am concerned that he has osteomyelitis based on exam.  He has a long history of diabetic wound issues as well as uncontrolled diabetes.  Patient needs IV antibiotics in hospital admission.  May need amputation.  Sending to the hospital.  He is going via private vehicle.  Final Clinical Impressions(s) / UC Diagnoses    Final diagnoses:  Diabetic foot infection [facility])     Discharge Instructions     Go directly to the hospital.  Take care  Dr. Lacinda Axon    ED Prescriptions    None     PDMP not reviewed this encounter.   Coral Spikes, [place] 01/06/21 1331

## 2021-01-06 NOTE — Progress Notes (Signed)
Pharmacy Antibiotic Note  [name] is a 60 y.o. male admitted on 01/06/2021. Pharmacy has been consulted for cefepime and vancomycin dosing for osteomyelitis.  Plan: Vancomycin 2000 mg IV x 1 followed by 1500 mg IV q12h Goal AUC 400-550 Expected AUC: 545 SCr used: 1.02  Cefepime 2 g IV q8h   Height: 6' (182.9 cm) Weight: 106 kg (233 lb 11 oz) IBW/kg (Calculated) : 77.6  Temp (24hrs), Avg:98.9 F (37.2 C), Min:98.2 F (36.8 C), Max:99.5 F (37.5 C)  Recent Labs  Lab 01/06/21 1416  WBC 15.9*  CREATININE 1.02  LATICACIDVEN 0.9    Estimated Creatinine Clearance: 100.6 mL/min (by C-G formula based on SCr of 1.02 mg/dL).    No Known Allergies  Antimicrobials this admission: Zosyn x 1 1/28 Vancomycin 1/28 >> Cefepime 1/28 >>  Microbiology results: 1/28 BCx: pending 1/28 Wound Cx: pending  Thank you for allowing pharmacy to be a part of this patient's care.  Pricilla Riffle, PharmD 01/06/2021 4:29 PM

## 2021-01-06 NOTE — Progress Notes (Signed)
Pt off the floor for MRI on my arrival to see pt in the ED.  Will see tomorrow.  Plan for surgery around 1200 tomorrow.  NPO at midnight.  MRI shows likely acute osteo of the hallux with possible abscess 1st MTPJ, chronic osteo 2nd toe, and possible osteo distal 3rd toe.  Will discuss procedure tomorrow likely consisting of partial 1st ray amputation and 2nd toe amputation, but will eval prior to surgery with formal consultation note.

## 2021-01-07 ENCOUNTER — Encounter
Admission: EM | Disposition: A | Discharge status: Home / Self Care | Payer: Self-pay | Source: Home / Self Care | Attending: Internal Medicine

## 2021-01-07 ENCOUNTER — Encounter: Payer: Self-pay | Admitting: Internal Medicine

## 2021-01-07 ENCOUNTER — Inpatient Hospital Stay: Payer: BC Managed Care – PPO | Admitting: Anesthesiology

## 2021-01-07 ENCOUNTER — Inpatient Hospital Stay: Payer: BC Managed Care – PPO

## 2021-01-07 HISTORY — PX: AMPUTATION: SHX166

## 2021-01-07 LAB — HEMOGLOBIN A1C
Hgb A1c MFr Bld: 6.1 % — ABNORMAL HIGH (ref 4.8–5.6)
Mean Plasma Glucose: 128.37 mg/dL

## 2021-01-07 LAB — BASIC METABOLIC PANEL WITH GFR
Anion gap: 9 (ref 5–15)
BUN: 14 mg/dL (ref 6–20)
CO2: 25 mmol/L (ref 22–32)
Calcium: 8.6 mg/dL — ABNORMAL LOW (ref 8.9–10.3)
Chloride: 102 mmol/L (ref 98–111)
Creatinine, Ser: 0.63 mg/dL (ref 0.61–1.24)
GFR, Estimated: >60 mL/min
Glucose, Bld: 123 mg/dL — ABNORMAL HIGH (ref 70–99)
Potassium: 3.7 mmol/L (ref 3.5–5.1)
Sodium: 136 mmol/L (ref 135–145)

## 2021-01-07 LAB — GLUCOSE, CAPILLARY
Glucose-Capillary: 143 mg/dL — ABNORMAL HIGH (ref 70–99)
Glucose-Capillary: 154 mg/dL — ABNORMAL HIGH (ref 70–99)
Glucose-Capillary: 316 mg/dL — ABNORMAL HIGH (ref 70–99)
Glucose-Capillary: 335 mg/dL — ABNORMAL HIGH (ref 70–99)

## 2021-01-07 LAB — CBC
HCT: 31 % — ABNORMAL LOW (ref 39.0–52.0)
Hemoglobin: 10.7 g/dL — ABNORMAL LOW (ref 13.0–17.0)
MCH: 31.7 pg (ref 26.0–34.0)
MCHC: 34.5 g/dL (ref 30.0–36.0)
MCV: 91.7 fL (ref 80.0–100.0)
Platelets: 180 10*3/uL (ref 150–400)
RBC: 3.38 MIL/uL — ABNORMAL LOW (ref 4.22–5.81)
RDW: 11.4 % — ABNORMAL LOW (ref 11.5–15.5)
WBC: 11.4 10*3/uL — ABNORMAL HIGH (ref 4.0–10.5)
nRBC: 0 % (ref 0.0–0.2)

## 2021-01-07 SURGERY — AMPUTATION, FOOT, RAY
Anesthesia: General | Site: Foot | Laterality: Left

## 2021-01-07 MED ORDER — ONDANSETRON HCL 4 MG/2ML IJ SOLN
INTRAMUSCULAR | Status: DC | PRN
  Administered 2021-01-07: 4 mg via INTRAVENOUS

## 2021-01-07 MED ORDER — LIDOCAINE HCL (CARDIAC) PF 100 MG/5ML IV SOSY
PREFILLED_SYRINGE | INTRAVENOUS | Status: DC | PRN
  Administered 2021-01-07: 100 mg via INTRAVENOUS

## 2021-01-07 MED ORDER — FENTANYL CITRATE (PF) 100 MCG/2ML IJ SOLN
INTRAMUSCULAR | Status: DC | PRN
  Administered 2021-01-07: 25 ug via INTRAVENOUS
  Administered 2021-01-07 (×2): 50 ug via INTRAVENOUS

## 2021-01-07 MED ORDER — DEXMEDETOMIDINE (PRECEDEX) IN NS 20 MCG/5ML (4 MCG/ML) IV SYRINGE
PREFILLED_SYRINGE | INTRAVENOUS | Status: DC | PRN
  Administered 2021-01-07: 12 ug via INTRAVENOUS

## 2021-01-07 MED ORDER — MIDAZOLAM HCL 2 MG/2ML IJ SOLN
INTRAMUSCULAR | Status: AC
  Filled 2021-01-07: qty 2

## 2021-01-07 MED ORDER — PHENYLEPHRINE HCL (PRESSORS) 10 MG/ML IV SOLN
INTRAVENOUS | Status: DC | PRN
  Administered 2021-01-07 (×2): 100 ug via INTRAVENOUS
  Administered 2021-01-07: 200 ug via INTRAVENOUS
  Administered 2021-01-07 (×5): 100 ug via INTRAVENOUS

## 2021-01-07 MED ORDER — OXYCODONE HCL 5 MG PO TABS
5.0000 mg | ORAL_TABLET | Freq: Once | ORAL | Status: DC | PRN
Start: 2021-01-07 — End: 2021-01-07

## 2021-01-07 MED ORDER — ONDANSETRON HCL 4 MG/2ML IJ SOLN
INTRAMUSCULAR | Status: AC
  Filled 2021-01-07: qty 2

## 2021-01-07 MED ORDER — FENTANYL CITRATE (PF) 100 MCG/2ML IJ SOLN
25.0000 ug | INTRAMUSCULAR | Status: DC | PRN
Start: 2021-01-07 — End: 2021-01-07

## 2021-01-07 MED ORDER — VASOPRESSIN 20 UNIT/ML IV SOLN
INTRAVENOUS | Status: AC
  Filled 2021-01-07: qty 1

## 2021-01-07 MED ORDER — DEXAMETHASONE SODIUM PHOSPHATE 10 MG/ML IJ SOLN
INTRAMUSCULAR | Status: DC | PRN
  Administered 2021-01-07: 8 mg via INTRAVENOUS

## 2021-01-07 MED ORDER — MIDAZOLAM HCL 2 MG/2ML IJ SOLN
INTRAMUSCULAR | Status: DC | PRN
  Administered 2021-01-07: 2 mg via INTRAVENOUS

## 2021-01-07 MED ORDER — FENTANYL CITRATE (PF) 100 MCG/2ML IJ SOLN
INTRAMUSCULAR | Status: AC
  Filled 2021-01-07: qty 2

## 2021-01-07 MED ORDER — OXYCODONE HCL 5 MG/5ML PO SOLN: 5.0000 mg | Freq: Once | ORAL | Status: DC | PRN

## 2021-01-07 MED ORDER — SODIUM CHLORIDE 0.9 % IV SOLN
INTRAVENOUS | Status: DC | PRN
  Administered 2021-01-07 – 2021-01-08 (×3): 250 mL via INTRAVENOUS

## 2021-01-07 MED ORDER — VASOPRESSIN 20 UNIT/ML IV SOLN
INTRAVENOUS | Status: DC | PRN
  Administered 2021-01-07: 2 [IU] via INTRAVENOUS
  Administered 2021-01-07 (×2): 1 [IU] via INTRAVENOUS

## 2021-01-07 MED ORDER — CEFAZOLIN SODIUM 1 G IJ SOLR
INTRAMUSCULAR | Status: AC
  Filled 2021-01-07: qty 20

## 2021-01-07 MED ORDER — CEFAZOLIN SODIUM-DEXTROSE 2-3 GM-%(50ML) IV SOLR
INTRAVENOUS | Status: DC | PRN
  Administered 2021-01-07: 2 g via INTRAVENOUS

## 2021-01-07 MED ORDER — PROPOFOL 10 MG/ML IV BOLUS
INTRAVENOUS | Status: DC | PRN
  Administered 2021-01-07: 150 mg via INTRAVENOUS

## 2021-01-07 MED ORDER — DEXAMETHASONE SODIUM PHOSPHATE 10 MG/ML IJ SOLN
INTRAMUSCULAR | Status: AC
  Filled 2021-01-07: qty 1

## 2021-01-07 MED ORDER — PROPOFOL 10 MG/ML IV BOLUS
INTRAVENOUS | Status: AC
  Filled 2021-01-07: qty 20

## 2021-01-07 MED ORDER — BUPIVACAINE HCL 0.5 % IJ SOLN
INTRAMUSCULAR | Status: DC | PRN
  Administered 2021-01-07: 20 mL

## 2021-01-07 SURGICAL SUPPLY — 49 items
BLADE MED AGGRESSIVE (BLADE) ×2 IMPLANT
BLADE OSC/SAGITTAL MD 5.5X18 (BLADE) ×1 IMPLANT
BLADE SURG 15 STRL LF DISP TIS (BLADE) IMPLANT
BLADE SURG 15 STRL SS (BLADE) ×1
BLADE SURG MINI STRL (BLADE) IMPLANT
BNDG CONFORM 2 STRL LF (GAUZE/BANDAGES/DRESSINGS) IMPLANT
BNDG ELASTIC 4X5.8 VLCR STR LF (GAUZE/BANDAGES/DRESSINGS) ×2 IMPLANT
BNDG ESMARK 4X12 TAN STRL LF (GAUZE/BANDAGES/DRESSINGS) ×2 IMPLANT
BNDG GAUZE 4.5X4.1 6PLY STRL (MISCELLANEOUS) ×2 IMPLANT
BNDG STRETCH 4X75 STRL LF (GAUZE/BANDAGES/DRESSINGS) ×1 IMPLANT
CANISTER SUCT 1200ML W/VALVE (MISCELLANEOUS) ×2 IMPLANT
CNTNR SPEC 2.5X3XGRAD LEK (MISCELLANEOUS) ×1
CONT SPEC 4OZ STER OR WHT (MISCELLANEOUS) ×1
CONTAINER SPEC 2.5X3XGRAD LEK (MISCELLANEOUS) ×1 IMPLANT
COVER WAND RF STERILE (DRAPES) ×2 IMPLANT
CUFF TOURN DUAL PL 12 NO SLV (MISCELLANEOUS) IMPLANT
CUFF TOURN SGL QUICK 18X4 (TOURNIQUET CUFF) ×1 IMPLANT
DRAPE FLUOR MINI C-ARM 54X84 (DRAPES) IMPLANT
DURAPREP 26ML APPLICATOR (WOUND CARE) ×2 IMPLANT
ELECT REM PT RETURN 9FT ADLT (ELECTROSURGICAL) ×2
ELECTRODE REM PT RTRN 9FT ADLT (ELECTROSURGICAL) ×1 IMPLANT
GAUZE SPONGE 4X4 12PLY STRL (GAUZE/BANDAGES/DRESSINGS) ×3 IMPLANT
GAUZE XEROFORM 1X8 LF (GAUZE/BANDAGES/DRESSINGS) ×2 IMPLANT
GLOVE INDICATOR 7.0 STRL GRN (GLOVE) ×2 IMPLANT
GLOVE SURG ENC MOIS LTX SZ7 (GLOVE) ×2 IMPLANT
GOWN STRL REUS W/ TWL LRG LVL3 (GOWN DISPOSABLE) ×2 IMPLANT
GOWN STRL REUS W/TWL LRG LVL3 (GOWN DISPOSABLE) ×2
HANDPIECE VERSAJET DEBRIDEMENT (MISCELLANEOUS) IMPLANT
KIT TURNOVER KIT A (KITS) ×2 IMPLANT
LABEL OR SOLS (LABEL) ×2 IMPLANT
MANIFOLD NEPTUNE II (INSTRUMENTS) ×2 IMPLANT
NDL FILTER BLUNT 18X1 1/2 (NEEDLE) ×1 IMPLANT
NDL HYPO 25X1 1.5 SAFETY (NEEDLE) ×2 IMPLANT
NEEDLE FILTER BLUNT 18X 1/2SAF (NEEDLE) ×1
NEEDLE FILTER BLUNT 18X1 1/2 (NEEDLE) ×1 IMPLANT
NEEDLE HYPO 25X1 1.5 SAFETY (NEEDLE) ×4 IMPLANT
NS IRRIG 500ML POUR BTL (IV SOLUTION) ×2 IMPLANT
PACK EXTREMITY ARMC (MISCELLANEOUS) ×2 IMPLANT
SOL .9 NS 3000ML IRR  AL (IV SOLUTION) ×1
SOL .9 NS 3000ML IRR UROMATIC (IV SOLUTION) IMPLANT
SOL PREP PVP 2OZ (MISCELLANEOUS) ×2
SOLUTION PREP PVP 2OZ (MISCELLANEOUS) ×1 IMPLANT
STOCKINETTE STRL 6IN 960660 (GAUZE/BANDAGES/DRESSINGS) ×2 IMPLANT
SUT ETHILON 3-0 FS-10 30 BLK (SUTURE) ×6
SUT VIC AB 3-0 SH 27 (SUTURE) ×1
SUT VIC AB 3-0 SH 27X BRD (SUTURE) ×1 IMPLANT
SUTURE EHLN 3-0 FS-10 30 BLK (SUTURE) ×2 IMPLANT
SWAB CULTURE AMIES ANAERIB BLU (MISCELLANEOUS) ×1 IMPLANT
SYR 10ML LL (SYRINGE) ×2 IMPLANT

## 2021-01-07 NOTE — Anesthesia Procedure Notes (Signed)
Procedure Name: LMA Insertion Date/Time: 01/07/2021 9:15 AM Performed by: Karoline Caldwell, CRNA Pre-anesthesia Checklist: Patient identified, Patient being monitored, Timeout performed, Emergency Drugs available and Suction available Patient Re-evaluated:Patient Re-evaluated prior to induction Oxygen Delivery Method: Circle system utilized Preoxygenation: Pre-oxygenation with 100% oxygen Induction Type: IV induction Ventilation: Mask ventilation without difficulty LMA: LMA inserted LMA Size: 4.5 Tube type: Oral Number of attempts: 1 Placement Confirmation: positive ETCO2 and breath sounds checked- equal and bilateral Tube secured with: Tape Dental Injury: Teeth and Oropharynx as per pre-operative assessment

## 2021-01-07 NOTE — Progress Notes (Signed)
Patient seen and examined in Postoperative recovery.   S/P Left partial first ray and second toe amputation by Dr. Excell Seltzer, Podiatry.  Patient reportedly with palpable pedal pulses.  Healthy tissue intraoperatively.  Exam: Palpable Left femoral and popliteal arteries. Surgical dressing in place.  Discussed with Podiatry: Will hold off on angiogram on Monday Follow up outpatient Vascular Surgery in 1-2 weeks.

## 2021-01-07 NOTE — Addendum Note (Signed)
Addendum  created 01/07/21 1220 by Karoline Caldwell, CRNA   Charge Capture section accepted, Visit diagnoses modified

## 2021-01-07 NOTE — Anesthesia Preprocedure Evaluation (Signed)
Anesthesia Evaluation  Patient identified by MRN, date of birth, ID band Patient awake    Reviewed: Allergy & Precautions, H&P , NPO status , Patient's Chart, lab work & pertinent test results  Airway Mallampati: III  TM Distance: <3 FB Neck ROM: full    Dental  (+) Chipped, Poor Dentition   Pulmonary neg pulmonary ROS, neg shortness of breath,    Pulmonary exam normal        Cardiovascular Exercise Tolerance: Good (-) angina+ Peripheral Vascular Disease  (-) PND Normal cardiovascular exam     Neuro/Psych negative neurological ROS  negative psych ROS   GI/Hepatic Neg liver ROS, GERD  Medicated and Controlled,  Endo/Other  diabetes, Type 2  Renal/GU      Musculoskeletal  (+) Arthritis ,   Abdominal   Peds  Hematology negative hematology ROS (+)   Anesthesia Other Findings Past Medical History: No date: Arthritis     Comment:  hands 12/29/2019: COVID-19 No date: Diabetes mellitus without complication (HCC)     Comment:  type 2 No date: GERD (gastroesophageal reflux disease) No date: Hypercholesterolemia No date: Osteomyelitis of left foot (HCC)  Past Surgical History: No date: ADENOIDECTOMY 10/07/2020: AMPUTATION; Right     Comment:  Procedure: AMPUTATION RAY-Right Fifth Ray;  Surgeon:               Rosetta Posner, DPM;  Location: [facility] ORS;  Service:               Podiatry;  Laterality: Right; 03/03/2020: CATARACT EXTRACTION W/PHACO; Right     Comment:  Procedure: CATARACT EXTRACTION PHACO AND INTRAOCULAR               LENS PLACEMENT (IOC) RIGHT DIABETIC VISION BLUE;                Surgeon: Elliot Cousin, MD;  Location: [facility]               [facility];  Service: Ophthalmology;  Laterality: Right;  COVID              ( + ) 12-29-2019  30.97 02:27.7 06/23/2020: CATARACT EXTRACTION W/PHACO; Left     Comment:  Procedure: CATARACT EXTRACTION PHACO AND INTRAOCULAR               LENS PLACEMENT (IOC) LEFT  DIABETIC VISION BLUE 5.14                00:34.7;  Surgeon: Elliot Cousin, MD;  Location: [facility]               SURGERY [facility];  Service: Ophthalmology;  Laterality: Left;              DIABETIC No date: ELBOW FRACTURE SURGERY; Right No date: EXTERNAL EAR SURGERY 10/07/2020: IRRIGATION AND DEBRIDEMENT FOOT; Right     Comment:  Procedure: IRRIGATION AND DEBRIDEMENT FOOT;  Surgeon:               Rosetta Posner, DPM;  Location: [facility] ORS;  Service:               Podiatry;  Laterality: Right;  BMI    Body Mass Index: 31.69 kg/m      Reproductive/Obstetrics negative OB ROS                             Anesthesia Physical Anesthesia Plan  ASA: III  Anesthesia Plan: General LMA   Post-op Pain Management:  Induction: Intravenous  PONV Risk Score and Plan: Dexamethasone, Ondansetron, Midazolam and Treatment may vary due to age or medical condition  Airway Management Planned: LMA  Additional Equipment:   Intra-op Plan:   Post-operative Plan: Extubation in OR  Informed Consent: I have reviewed the patients History and Physical, chart, labs and discussed the procedure including the risks, benefits and alternatives for the proposed anesthesia with the patient or authorized representative who has indicated his/her understanding and acceptance.     Dental Advisory Given  Plan Discussed with: Anesthesiologist, CRNA and Surgeon  Anesthesia Plan Comments: (Patient consented for risks of anesthesia including but not limited to:  - adverse reactions to medications - damage to eyes, teeth, lips or other oral mucosa - nerve damage due to positioning  - sore throat or hoarseness - Damage to heart, brain, nerves, lungs, other parts of body or loss of life  Patient voiced understanding.)        Anesthesia Quick Evaluation

## 2021-01-07 NOTE — Discharge Instructions (Signed)
Podiatry discharge instructions: 1.  Keep surgical dressings clean, dry, and intact.  If dressings become wet or saturated please call clinic for instruction 2.  Try to maintain partial weightbearing with heel contact in a surgical shoe to the left foot.  Try to stay off the left foot is much as possible and only walk short distances. 3.  Take antibiotics as prescribed until gone 4.  Please call clinic to make follow-up appointment within 1 week of discharge date for dressing change and assessment of incision.

## 2021-01-07 NOTE — Op Note (Signed)
PODIATRY / FOOT AND ANKLE SURGERY OPERATIVE REPORT    SURGEON: Caroline More, DPM  PRE-OPERATIVE DIAGNOSIS:  1.  Left hallux and first metatarsal head osteomyelitis with abscess and cellulitis 2.  Left second toe osteomyelitis, chronic with associated cellulitis 3.  Diabetes type 2 polyneuropathy 4.  PVD  POST-OPERATIVE DIAGNOSIS: Same  PROCEDURE(S): 1. Left partial first ray amputation 2. Left second toe amputation  HEMOSTASIS: Left ankle tourniquet  ANESTHESIA: MAC  ESTIMATED BLOOD LOSS: 30 cc  FINDING(S): 1.  Osteomyelitis with abscess left hallux extending to the first metatarsal phalangeal joint with septic joint to the IPJ hallux and first metatarsal phalangeal joint 2.  Left second toe osteomyelitis  PATHOLOGY/SPECIMEN(S): Pathology specimen left second toe and left partial first ray amputations with proximal margins marked in purple ink.  Bone culture taken left IPJ hallux.  Wound culture left foot wound  INDICATIONS:   Cabe Lashley is a 60 y.o. male who presents with a nonhealing ulceration to the left dorsal medial IPJ hallux.  Patient also has a chronic history of left second toe chronic osteomyelitis.  Patient has been treated for these issues in the wound care center.  Patient noted around 1 to 2 weeks ago he began having pain discomfort as well as swelling and redness in the area.  Patient not calling for any medical advice or any medical attention until recently when he came to the ED yesterday after going to an urgent care and had x-rays taken which showed osteomyelitis to the left hallux.  Patient was brought into the emergency room for further evaluation and management.  Podiatry team was consulted for further treatment.  MRI was taken showing osteomyelitis to the second digit as well as first digit and concern for septic joint at the IPJ hallux as well as left first metatarsal phalangeal joint with abscess present.  Discussed all treatment options with the patient  both conservative and surgical attempts at correction including potential risks and complications at this time patient is elected for surgical procedure consisting of left partial first ray amputation with second toe amputation.  All questions answered including postoperative course in detail.  No guarantees given.  Patient understands and consent signed and placed in chart..  DESCRIPTION: After obtaining full informed written consent, the patient was brought back to the operating room and placed supine upon the operating table.  The patient received IV antibiotics prior to induction.  After obtaining adequate anesthesia, 20 cc of half percent Marcaine plain was injected about the first and second rays.  A pneumatic ankle tourniquet was applied about the patient's left ankle.  The patient was prepped and draped in the standard fashion.  An Esmarch was used to exsanguinate the left lower extremity and the pneumatic ankle tourniquet was inflated.  Attention was then directed to the left first metatarsal phalangeal joint and second tarsal phalangeal joint where a racquet type incision was made around the first and second bases of the proximal phalanxes of the first and second toes and the racquet extending up the medial to dorsal medial first metatarsal midshaft.  The incision was made straight to bone at this level.  At this time extensor tenotomy's and capsulotomies were performed followed by release the collateral and suspensory ligaments as well as the plantar plate and flexor tendon.  The first and second toes were disarticulated and passed off the operative site.  Notable appearance was able to be expressed from the first interspace level as well as first metatarsal phalangeal joint and  IPJ hallux.  Wound culture was taken of the IPJ hallux along with a bone culture and passed off the operative site.  The second metatarsal head was evaluated not noted to have any evidence of osteomyelitis.  There did appear  to be purulence within the first metatarsal phalangeal joint level though so at this time it was determined to take the first metatarsal head and sesamoids due to concern for potential bone infection based on previous imaging as well as clinical evaluation today.  Circumferential dissection was continued about the first metatarsal head and a sagittal bone saw was used to resect the first metatarsal head with the appropriate angle.  The sesamoids were resected and passed off the operative site.  The proximal margins of the first metatarsal head and second digit base were marked in purple ink and the first and second toes and first metatarsal head were passed off the operative site and sent for pathology.  The extensor and flexor tendons were cut as far proximally as possible.  The surgical site was flushed with copious amounts normal sterile saline.  The deep tissues then reapproximated well coapted with 3-0 Vicryl.  The subcutaneous tissue was loosely reapproximated well coapted with 3-0 Vicryl.  The skin was then reapproximated well coapted with 3-0 nylon combination of simple and horizontal mattress type stitching.  The pneumatic ankle tourniquet was deflated and a prompt hyperemic response was noted to all digits left foot that remained as well as the flaps.  The patient tolerated the procedure and anesthesia well was transferred to recovery room vital signs stable.  The patient will be discharged back to the inpatient room with the appropriate orders and medications.  Patient is to keep dressing clean, dry, and intact and will be reevaluated again likely Monday for potential discharge and dressing change.  Appreciate medicine recommendations for antibiotic therapy, cultures pending.  Patient is to be partial weightbearing with heel contact in a surgical shoe, PT ordered.  COMPLICATIONS: None  CONDITION: Good, stable  Caroline More, DPM

## 2021-01-07 NOTE — Addendum Note (Signed)
Addendum  created 01/07/21 1152 by Karoline Caldwell, CRNA   Intraprocedure Event edited, Intraprocedure Staff edited

## 2021-01-07 NOTE — Progress Notes (Signed)
PT Cancellation Note  Patient Details Name: [name] MRN: [id number] DOB: [birth year]   Cancelled Treatment:    Reason Eval/Treat Not Completed: Other (comment).  Pt has just come from surgery and will assess tomorrow.   Ivar Drape 01/07/2021, 1:54 PM   Samul Dada, PT MS Acute Rehab Dept. Number: [facility] [id number] and [facility] [phone]

## 2021-01-07 NOTE — Transfer of Care (Signed)
Immediate Anesthesia Transfer of Care Note  Patient: [name]  Procedure(s) Performed: AMPUTATION RAY - Left 1st and 2nd Ray (Left Foot)  Patient Location: PACU  Anesthesia Type:General  Level of Consciousness: sedated  Airway & Oxygen Therapy: Patient Spontanous Breathing and Patient connected to face mask oxygen  Post-op Assessment: Report given to RN and Post -op Vital signs reviewed and stable  Post vital signs: Reviewed and stable  Last Vitals:  Vitals Value Taken Time  BP 95/61 01/07/21 1026  Temp    Pulse 66 01/07/21 1033  Resp 17 01/07/21 1033  SpO2 99 % 01/07/21 1033  Vitals shown include unvalidated device data.  Last Pain:  Vitals:   01/07/21 1020  TempSrc:   PainSc: Asleep         Complications: No complications documented.

## 2021-01-07 NOTE — Consult Note (Addendum)
PODIATRY / FOOT AND ANKLE SURGERY CONSULTATION NOTE  Requesting Physician: Londell Moh, DO  Reason for consult: L foot wound infection  Chief Complaint: L foot infection   HPI: [name] is a 60 y.o. male who presents with a chronic nonhealing ulceration to the left IPJ hallux.  Patient noticed that about 1 to 2 weeks ago the foot became red hot and swollen in the area of the wound and appeared to have purulent drainage.  Patient tried to treat this himself at home patient neglected to contact wound care clinic in which she is being seen for these wounds to state that his wounds had worsened.  Patient previously underwent a right partial fifth ray amputation by myself due to infection to that area and has been treating the left foot wounds with the wound care center.  Patient has healed his right partial fifth ray amputation.  Patient was admitted for further evaluation work-up.  Podiatry team was consulted for further management and potential surgical intervention.  Patient's last hemoglobin A1c was around 10%.  Patient is an uncontrolled diabetic with numbness and tingling to both feet.  Patient has pain to the left forefoot.  Patient previously also had an ulceration to his left second toe which showed chronic osteomyelitis and previous imaging.  PMHx:  Past Medical History:  Diagnosis Date  . Arthritis    hands  . COVID-19 12/29/2019  . Diabetes mellitus without complication (HCC)    type 2  . GERD (gastroesophageal reflux disease)   . Hypercholesterolemia   . Osteomyelitis of left foot [facility])     Surgical Hx:  Past Surgical History:  Procedure Laterality Date  . ADENOIDECTOMY    . AMPUTATION Right 10/07/2020   Procedure: AMPUTATION RAY-Right Fifth Ray;  Surgeon: Rosetta Posner, DPM;  Location: [facility] ORS;  Service: Podiatry;  Laterality: Right;  . CATARACT EXTRACTION W/PHACO Right 03/03/2020   Procedure: CATARACT EXTRACTION PHACO AND INTRAOCULAR LENS PLACEMENT (IOC) RIGHT DIABETIC  VISION BLUE;  Surgeon: Elliot Cousin, MD;  Location: [facility];  Service: Ophthalmology;  Laterality: Right;  COVID ( + ) 12-29-2019  30.97 02:27.7  . CATARACT EXTRACTION W/PHACO Left 06/23/2020   Procedure: CATARACT EXTRACTION PHACO AND INTRAOCULAR LENS PLACEMENT (IOC) LEFT DIABETIC VISION BLUE 5.14  00:34.7;  Surgeon: Elliot Cousin, MD;  Location: [facility];  Service: Ophthalmology;  Laterality: Left;  DIABETIC  . ELBOW FRACTURE SURGERY Right   . EXTERNAL EAR SURGERY    . IRRIGATION AND DEBRIDEMENT FOOT Right 10/07/2020   Procedure: IRRIGATION AND DEBRIDEMENT FOOT;  Surgeon: Rosetta Posner, DPM;  Location: [facility] ORS;  Service: Podiatry;  Laterality: Right;    FHx:  Family History  Problem Relation Age of Onset  . Dementia Mother   . Hypertension Mother   . Diabetes Maternal Grandmother   . Hypertension Maternal Grandmother   . Cancer Neg Hx   . COPD Neg Hx   . Heart disease Neg Hx   . Stroke Neg Hx     Social History:  reports that he has never smoked. He has never used smokeless tobacco. He reports previous alcohol use. He reports that he does not use drugs.  Allergies: No Known Allergies  Review of Systems: General ROS: negative Respiratory ROS: no cough, shortness of breath, or wheezing Cardiovascular ROS: no chest pain or dyspnea on exertion Gastrointestinal ROS: no abdominal pain, change in bowel habits, or black or bloody stools Musculoskeletal ROS: positive for - joint pain and joint swelling Neurological ROS: positive for -  numbness/tingling Dermatological ROS: positive for Left hallux ulceration, swelling left forefoot  Medications Prior to Admission  Medication Sig Dispense Refill  . Ascorbic Acid (VITAMIN C CR) 1500 MG TBCR Take 1,500 mg by mouth daily.    Marland Kitchen aspirin EC 325 MG tablet Take 325 mg by mouth daily.    Marland Kitchen atorvastatin (LIPITOR) 20 MG tablet Take 1 tablet (20 mg total) by mouth every other day. 90 tablet 1  . Cinnamon 500 MG capsule  Take 500 mg by mouth daily.    . Garlic 1000 MG CAPS Take 1,000 mg by mouth daily.    Marland Kitchen glipiZIDE (GLUCOTROL) 5 MG tablet Take 1 tablet (5 mg total) by mouth 2 (two) times daily before a meal. 180 tablet 1  . insulin degludec (TRESIBA FLEXTOUCH) 100 UNIT/ML FlexTouch Pen Inject 18 Units into the skin daily. 15 mL 2  . metFORMIN (GLUCOPHAGE) 1000 MG tablet Take 1 tablet (1,000 mg total) by mouth 2 (two) times daily with a meal. 180 tablet 1    Physical Exam: General: Alert and oriented.  No apparent distress. Vascular: DP/PT pulses palpable bilateral.  Capillary fill time intact digits bilateral.  Moderate erythema and edema present to the left forefoot over the first and second toes extending to the metatarsal phalangeal joints.  Neuro: Light touch sensation diminished to bilateral lower extremities.  Derm: Ulceration present that probes to bone to the level of the left IPJ hallux dorsal medial.  Appears to have extension of purulent type fluid to the metatarsal phalangeal joint consistent with likely abscess as fluctuance is palpable.  The left second toe also appears to be erythematous and edematous grossly to the level of the second metatarsal phalangeal joint concerning for potential chronic osteomyelitis based on previous imaging.  Purulence able to be expressed from the wound at the IPJ hallux left.  MSK: Right partial fifth ray amputation.  Hammertoe contractures digits 1 through 5 left, 1 through 4 right.  Results for orders placed or performed during the hospital encounter of 01/06/21 (from the past 48 hour(s))  Hemoglobin A1c     Status: Abnormal   Collection Time: 01/06/21  2:15 PM  Result Value Ref Range   Hgb A1c MFr Bld 6.1 (H) 4.8 - 5.6 %    Comment: (NOTE) Pre diabetes:          5.7%-6.4%  Diabetes:              >6.4%  Glycemic control for   <7.0% adults with diabetes    Mean Plasma Glucose 128.37 mg/dL    Comment: Performed at [facility], [address],  [place], [place] [address]  Lactic acid, plasma     Status: None   Collection Time: 01/06/21  2:16 PM  Result Value Ref Range   Lactic Acid, Venous 0.9 0.5 - 1.9 mmol/L    Comment: Performed at [facility], [address] [place], [place] [address]  Comprehensive metabolic panel     Status: Abnormal   Collection Time: 01/06/21  2:16 PM  Result Value Ref Range   Sodium 135 135 - 145 mmol/L   Potassium 3.8 3.5 - 5.1 mmol/L   Chloride 99 98 - 111 mmol/L   CO2 24 22 - 32 mmol/L   Glucose, Bld 77 70 - 99 mg/dL    Comment: Glucose reference range applies only to samples taken after fasting for at least 8 hours.   BUN 27 (H) 6 - 20 mg/dL   Creatinine,  Ser 1.02 0.61 - 1.24 mg/dL   Calcium 9.3 8.9 - 62.3 mg/dL   Total Protein 7.7 6.5 - 8.1 g/dL   Albumin 3.8 3.5 - 5.0 g/dL   AST 23 15 - 41 U/L   ALT 19 0 - 44 U/L   Alkaline Phosphatase 49 38 - 126 U/L   Total Bilirubin 0.9 0.3 - 1.2 mg/dL   GFR, Estimated >76 >28 mL/min    Comment: (NOTE) Calculated using the CKD-EPI Creatinine Equation (2021)    Anion gap 12 5 - 15    Comment: Performed at [facility], [address] [place], [place] [address]  CBC with Differential     Status: Abnormal   Collection Time: 01/06/21  2:16 PM  Result Value Ref Range   WBC 15.9 (H) 4.0 - 10.5 K/uL   RBC 3.68 (L) 4.22 - 5.81 MIL/uL   Hemoglobin 11.8 (L) 13.0 - 17.0 g/dL   HCT 61.6 (L) 07.3 - 71.0 %   MCV 92.1 80.0 - 100.0 fL   MCH 32.1 26.0 - 34.0 pg   MCHC 34.8 30.0 - 36.0 g/dL   RDW 62.6 94.8 - 54.6 %   Platelets 219 150 - 400 K/uL   nRBC 0.0 0.0 - 0.2 %   Neutrophils Relative % 85 %   Neutro Abs 13.4 (H) 1.7 - 7.7 K/uL   Lymphocytes Relative 7 %   Lymphs Abs 1.1 0.7 - 4.0 K/uL   Monocytes Relative 7 %   Monocytes Absolute 1.1 (H) 0.1 - 1.0 K/uL   Eosinophils Relative 0 %   Eosinophils Absolute 0.1 0.0 - 0.5 K/uL   Basophils Relative 0 %   Basophils Absolute 0.0 0.0 - 0.1 K/uL   Immature Granulocytes 1 %   Abs Immature  Granulocytes 0.08 (H) 0.00 - 0.07 K/uL    Comment: Performed at [facility], [address] [place], [place] [address]  CBG monitoring, ED     Status: None   Collection Time: 01/06/21  2:18 PM  Result Value Ref Range   Glucose-Capillary 72 70 - 99 mg/dL    Comment: Glucose reference range applies only to samples taken after fasting for at least 8 hours.  Blood culture (routine x 2)     Status: None (Preliminary result)   Collection Time: 01/06/21  2:22 PM   Specimen: BLOOD  Result Value Ref Range   Specimen Description BLOOD LEFT ANTECUBITAL    Special Requests      BOTTLES DRAWN AEROBIC AND ANAEROBIC Blood Culture adequate volume   Culture      NO GROWTH < 24 HOURS Performed at [facility], [address] [place], [place] [address]    Report Status PENDING   Blood culture (routine x 2)     Status: None (Preliminary result)   Collection Time: 01/06/21  2:22 PM   Specimen: BLOOD  Result Value Ref Range   Specimen Description BLOOD BLOOD RIGHT WRIST    Special Requests      BOTTLES DRAWN AEROBIC AND ANAEROBIC Blood Culture adequate volume   Culture      NO GROWTH < 24 HOURS Performed at [facility], [address], [place], [place] [address]    Report Status PENDING   SARS Coronavirus 2 by RT PCR (hospital order, performed in [facility] hospital lab) Nasopharyngeal Nasopharyngeal Swab     Status: None   Collection Time: 01/06/21  3:26 PM   Specimen: Nasopharyngeal Swab  Result Value Ref Range  SARS Coronavirus 2 NEGATIVE NEGATIVE    Comment: (NOTE) SARS-CoV-2 target nucleic acids are NOT DETECTED.  The SARS-CoV-2 RNA is generally detectable in upper and lower respiratory specimens during the acute phase of infection. The lowest concentration of SARS-CoV-2 viral copies this assay can detect is 250 copies / mL. A negative result does not preclude SARS-CoV-2 infection and should not be used as the sole basis for treatment or other patient  management decisions.  A negative result may occur with improper specimen collection / handling, submission of specimen other than nasopharyngeal swab, presence of viral mutation(s) within the areas targeted by this assay, and inadequate number of viral copies (<250 copies / mL). A negative result must be combined with clinical observations, patient history, and epidemiological information.  Fact Sheet for Patients:   BoilerBrush.com.cy  Fact Sheet for Healthcare Providers: https://pope.com/  This test is not yet approved or  cleared by the [place] FDA and has been authorized for detection and/or diagnosis of SARS-CoV-2 by FDA under an Emergency Use Authorization (EUA).  This EUA will remain in effect (meaning this test can be used) for the duration of the COVID-19 declaration under Section 564(b)(1) of the Act, 21 U.S.C. section 360bbb-3(b)(1), unless the authorization is terminated or revoked sooner.  Performed at [facility], [address] [place], [place] [address]   Wound or Superficial Culture     Status: None (Preliminary result)   Collection Time: 01/06/21  3:52 PM   Specimen: Toe; Wound  Result Value Ref Range   Specimen Description      TOE LEFT Performed at [facility], [address], [place], [place] [address]    Special Requests      NONE Performed at [facility], [address] [place], [place] [address]    Gram Stain      FEW WBC PRESENT, PREDOMINANTLY PMN MODERATE GRAM POSITIVE COCCI Performed at [facility], [address] [place], [place] [address]    Culture PENDING    Report Status PENDING   Aerobic/Anaerobic Culture (surgical/deep wound)     Status: None (Preliminary result)   Collection Time: 01/06/21  3:52 PM   Specimen: Toe; Wound  Result Value Ref Range   Specimen Description      TOE LEFT Performed at [facility], [address]  [place], [place] [address]    Special Requests      NONE Performed at [facility], [address] [place], [place] [address]    Gram Stain      NO WBC SEEN FEW GRAM POSITIVE COCCI Performed at [facility], [address], [place], [place] [address]    Culture PENDING    Report Status PENDING   CBG monitoring, ED     Status: None   Collection Time: 01/06/21  7:09 PM  Result Value Ref Range   Glucose-Capillary 98 70 - 99 mg/dL    Comment: Glucose reference range applies only to samples taken after fasting for at least 8 hours.  Glucose, capillary     Status: Abnormal   Collection Time: 01/06/21  9:18 PM  Result Value Ref Range   Glucose-Capillary 157 (H) 70 - 99 mg/dL    Comment: Glucose reference range applies only to samples taken after fasting for at least 8 hours.   Comment 1 Notify RN   Urinalysis, Complete w Microscopic     Status: Abnormal   Collection Time: 01/06/21 10:30 PM  Result Value  Ref Range   Color, Urine YELLOW (A) YELLOW   APPearance CLEAR (A) CLEAR   Specific Gravity, Urine 1.021 1.005 - 1.030   pH 5.0 5.0 - 8.0   Glucose, UA NEGATIVE NEGATIVE mg/dL   Hgb urine dipstick SMALL (A) NEGATIVE   Bilirubin Urine NEGATIVE NEGATIVE   Ketones, ur 20 (A) NEGATIVE mg/dL   Protein, ur 696 (A) NEGATIVE mg/dL   Nitrite NEGATIVE NEGATIVE   Leukocytes,Ua NEGATIVE NEGATIVE   WBC, UA 0-5 0 - 5 WBC/hpf   Bacteria, UA NONE SEEN NONE SEEN   Squamous Epithelial / LPF NONE SEEN 0 - 5   Mucus PRESENT     Comment: Performed at [facility], [address] [place], [place] [address]  MRSA PCR Screening     Status: None   Collection Time: 01/06/21 10:30 PM   Specimen: Nasopharyngeal  Result Value Ref Range   MRSA by PCR NEGATIVE NEGATIVE    Comment:        The GeneXpert MRSA Assay (FDA approved for NASAL specimens only), is one component of a comprehensive MRSA colonization surveillance program. It is not intended to diagnose MRSA infection nor to  guide or monitor treatment for MRSA infections. Performed at [facility], [address] [place], [place] [address]   CBC     Status: Abnormal   Collection Time: 01/07/21  4:47 AM  Result Value Ref Range   WBC 11.4 (H) 4.0 - 10.5 K/uL   RBC 3.38 (L) 4.22 - 5.81 MIL/uL   Hemoglobin 10.7 (L) 13.0 - 17.0 g/dL   HCT 40.1 (L) 02.7 - 25.3 %   MCV 91.7 80.0 - 100.0 fL   MCH 31.7 26.0 - 34.0 pg   MCHC 34.5 30.0 - 36.0 g/dL   RDW 66.4 (L) 40.3 - 47.4 %   Platelets 180 150 - 400 K/uL   nRBC 0.0 0.0 - 0.2 %    Comment: Performed at [facility], [address] [place], [place] [address]  Basic metabolic panel     Status: Abnormal   Collection Time: 01/07/21  4:47 AM  Result Value Ref Range   Sodium 136 135 - 145 mmol/L   Potassium 3.7 3.5 - 5.1 mmol/L   Chloride 102 98 - 111 mmol/L   CO2 25 22 - 32 mmol/L   Glucose, Bld 123 (H) 70 - 99 mg/dL    Comment: Glucose reference range applies only to samples taken after fasting for at least 8 hours.   BUN 14 6 - 20 mg/dL   Creatinine, Ser 3.87 0.61 - 1.24 mg/dL   Calcium 8.6 (L) 8.9 - 10.3 mg/dL   GFR, Estimated >56 >43 mL/min    Comment: (NOTE) Calculated using the CKD-EPI Creatinine Equation (2021)    Anion gap 9 5 - 15    Comment: Performed at [facility], [address] [place], [place] [address]   MR FOOT LEFT WO CONTRAST  Result Date: 01/06/2021 CLINICAL DATA:  Worsening blistering along the great toe. Chronic wound in this vicinity. EXAM: MRI OF THE LEFT FOOT WITHOUT CONTRAST TECHNIQUE: Multiplanar, multisequence MR imaging of the left foot was performed. No intravenous contrast was administered. COMPARISON:  Radiographs from 06/07/2020 FINDINGS: Bones/Joint/Cartilage Diffuse abnormal osseous edema in the proximal and distal phalanges of the great toe suspicious for osteomyelitis, with joint effusion of the interphalangeal joint of the great toe which could be septic and which may be draining out to the  dorsal lateral cutaneous surface on  images 10 through 11 of series 6. There is also a dorsal joint effusion of the first MTP joint. Valgus deformity at the interphalangeal joint of the great toe with extensive subcutaneous edema and a small amount of gas in the soft tissues suspicious for potential tissue necrosis. Abnormal edema signal is present in the phalanges of the second toe potentially with some deformity of the proximal phalanx, likewise suspicious for possible osteomyelitis in this setting. Equivocal edema signal in the distal phalanx of the third toe, with accentuated T2 signal but not accompanied by reduced T1 signal, hence equivocal for osteomyelitis. Patchy marrow edema in the medial cuneiform, navicular, and along the base of the second metatarsal, probably degenerative. Ligaments The Lisfranc ligament appears grossly intact on image 11 of series 13 although there are regional degenerative findings and possibly mild erosive findings in this area. Muscles and Tendons Poor definition of the distal flexor hallucis longus tendon. Diffuse regional muscular edema, possibly neurogenic or from myositis. Soft tissues Dorsal subcutaneous edema tracking into the toes, likely from cellulitis. IMPRESSION: 1. Osteomyelitis of the great toe, second toe, and possibly distal phalanx of the third toe. Probable septic joint of the interphalangeal joint of the great toe, probably draining to the cutaneous surface, with some gas in the adjacent soft tissues and extensive regional cellulitis. There is also an effusion of the first MTP joint, indeterminate for septic joint. 2. Subcutaneous edema favoring cellulitis in the foot. Edema within all visualized muscular structures, possibly neurogenic or from myositis. 3. Degenerative findings in the midfoot and along parts of the Lisfranc joint. Electronically Signed   By: Gaylyn Rong M.D.   On: 01/06/2021 18:53   DG Toe Great Left  Result Date: 01/06/2021 CLINICAL  DATA:  Pt with recent hx of first and 2nd toe infection that healed with wound center. States that he has been having problems with left great toe X 2 weeks; worsening today. Swelling, redness and clear fluid leaking from toe. Patient complains of left toe wound that has been ongoing for 8 months. States that he has been seeing wound clinic and had healed up. States that around 13 days ago he started to noticed a blister forming on his left big toe EXAM: LEFT GREAT TOE COMPARISON:  06/07/2020 FINDINGS: There is bone resorption along the medial margin of the distal aspect of the proximal phalanx. There is also a apparent fracture along the plantar base of the distal phalanx with associated focal osteopenia. Joint is normally aligned. There is surrounding soft tissue swelling with evidence of a soft tissue ulcer. There is a small amount of soft tissue air along the lateral aspect of the toe at the level of the IP joint. IMPRESSION: 1. Findings consistent with osteomyelitis involving the medial head of the proximal phalanx as well as the plantar base of the distal phalanx. The plantar base of the distal phalanx also shows a fracture, visualized on the lateral view only. There is adjacent soft tissue swelling and air. Consider septic arthritis of the IP joint in the proper clinical setting. Electronically Signed   By: Amie Portland M.D.   On: 01/06/2021 15:07    Blood pressure (!) 165/74, pulse 86, temperature 98.5 F (36.9 C), resp. rate 18, height 6' (1.829 m), weight 106 kg, SpO2 95 %.  Assessment 1. Acute osteomyelitis to the right hallux with associated ulceration and abscess with concern for septic joint to the IPJ hallux and first metatarsal phalangeal joint 2. Cellulitis left foot 3. Chronic  osteomyelitis 4. DM2 with polyneuropathy  Plan -Pt seen and examined. -Large abscess L hallux with wound that probes to bone at IPJ hallux and fluctuance over the 1st MTPJ.  L 2nd toe grossly swollen and  erythematous -X-ray and MRI reviewed.  Osteo of the hallux and 2nd toe with possible abscess 1st MTPJ -Discussed all treatment options with the patient of both conservative and surgical attempts at correction including postop course and at this time patient has elected for L partial 1st ray amputation and 2nd toe amputation. -Appreciate med recs for IV Abx. -PWB heel contact in surgical shoe after surgery. -Pt NPO currently for surgery at 0930 today.  All questions answered, no guarantees given. -Appreciate vascular recs, plan for angio on Monday.  Rosetta PosnerAndrew Melvina Pangelinan, DPM 01/07/2021, 8:50 AM

## 2021-01-07 NOTE — Progress Notes (Addendum)
Progress Note    [name]  LAG:[id number] DOB: [birth year]  DOA: 01/06/2021 PCP: Patient, No Pcp Per      Brief Narrative:    Medical records reviewed and are as summarized below:  [name] is a 60 y.o. male with medical history significant for IDDM2, history of diabetic foot ulcer, obesity, who presented to the hospital because of worsening wound infection of the left great toe.  Presents emergency department for chief concerns of worsening wound infection of the left great toe.  He noticed redness, warmth and swelling in the area of the wound and this was associated with purulent drainage.  He was found to have acute osteomyelitis of the left great toe and second toe cellulitis of the left foot.  He was treated with IV fluids, analgesics and empiric IV antibiotics.  He was seen by the vascular surgeon who recommended outpatient follow-up.  He was seen in consultation by the podiatrist.  He underwent left partial first ray amputation and left second toe amputation on January 07, 2021.     Assessment/Plan:   Principal Problem:   Osteomyelitis (HCC) Active Problems:   Obesity   Hyperlipidemia associated with type 2 diabetes mellitus (HCC)   Type 2 diabetes mellitus with diabetic neuropathy, with long-term current use of insulin (HCC)   Diabetic ulcer of toe of right foot associated with type 2 diabetes mellitus (HCC)   PAD (peripheral artery disease) (HCC)   Cellulitis of right lower extremity   Status post amputation of lesser toe of right foot (HCC)   Body mass index is 31.69 kg/m.  (Obesity)   Acute osteomyelitis of left great toe and left second toe, probable septic joint of interphalangeal joint of the left great toe, left foot cellulitis: s/p left partial first ray amputation and left second toe amputation on January 07, 2021.  Continue.  IV antibiotics.  Analgesics as needed for pain.  Vascular surgeon recommended outpatient follow-up.  Type II DM with  peripheral neuropathy: Hemoglobin A1c 6.1.  Continue Lantus and NovoLog.  Glipizide has been held.  Discontinue Metformin.  Hyperlipidemia: Continue Lipitor   Diet Order            Diet heart healthy/carb modified Room service appropriate? Yes; Fluid consistency: Thin  Diet effective now                    Consultants:  Podiatrist  Vascular surgeon  Procedures:  Left partial first ray amputation left second toe amputation on January 07, 2021    Medications:   . atorvastatin  20 mg Oral QODAY  . chlorhexidine  60 mL Topical Once  . insulin aspart  0-15 Units Subcutaneous TID WC  . insulin aspart  0-5 Units Subcutaneous QHS  . insulin glargine  18 Units Subcutaneous QHS  . povidone-iodine  2 application Topical Once   Continuous Infusions: . ceFEPime (MAXIPIME) IV 2 g (01/06/21 2215)  . vancomycin       Anti-infectives (From admission, onward)   Start     Dose/Rate Route Frequency Ordered Stop   01/07/21 0600  vancomycin (VANCOREADY) IVPB 1500 mg/300 mL        1,500 mg 150 mL/hr over 120 Minutes Intravenous Every 12 hours 01/06/21 1704     01/06/21 2200  ceFEPIme (MAXIPIME) 2 g in sodium chloride 0.9 % 100 mL IVPB        2 g 200 mL/hr over 30 Minutes Intravenous Every 8 hours 01/06/21 1633  01/06/21 1545  vancomycin (VANCOREADY) IVPB 2000 mg/400 mL        2,000 mg 200 mL/hr over 120 Minutes Intravenous  Once 01/06/21 1531 01/06/21 1854   01/06/21 1530  piperacillin-tazobactam (ZOSYN) IVPB 3.375 g        3.375 g 100 mL/hr over 30 Minutes Intravenous  Once 01/06/21 1528 01/06/21 1647   01/06/21 1530  vancomycin (VANCOREADY) IVPB 1500 mg/300 mL  Status:  Discontinued        1,500 mg 150 mL/hr over 120 Minutes Intravenous  Once 01/06/21 1528 01/06/21 1531             Family Communication/Anticipated D/C date and plan/Code Status   DVT prophylaxis: SCDs Start: 01/06/21 1618     Code Status: Full Code  Family Communication: None Disposition  Plan:    Status is: Inpatient  Remains inpatient appropriate because:IV treatments appropriate due to intensity of illness or inability to take PO   Dispo: The patient is from: Home              Anticipated d/c is to: Home              Anticipated d/c date is: 3 days              Patient currently is not medically stable to d/c.   Difficult to place patient No           Subjective:   Interval events noted.  C/o mild pain in the left foot.  No other complaints.  Objective:    Vitals:   01/07/21 1100 01/07/21 1114 01/07/21 1122 01/07/21 1152  BP:  (!) 103/59 (!) 100/56 106/63  Pulse: 76 71 74 75  Resp: 17 18  16   Temp:   98 F (36.7 C) 98.5 F (36.9 C)  TempSrc:    Oral  SpO2: 95% 97% 92% 93%  Weight:      Height:       No data found.   Intake/Output Summary (Last 24 hours) at 01/07/2021 1321 Last data filed at 01/07/2021 1122 Gross per 24 hour  Intake 1159.96 ml  Output 1435 ml  Net -275.04 ml   Filed Weights   01/06/21 1414  Weight: 106 kg    Exam:  GEN: NAD SKIN: No rash EYES: EOMI ENT: MMM CV: RRR PULM: CTA B ABD: soft, ND, NT, +BS CNS: AAO x 3, non focal EXT: Dressing on left foot is clean, dry and intact.    Data Reviewed:   I have personally reviewed following labs and imaging studies:  Labs: Labs show the following:   Basic Metabolic Panel: Recent Labs  Lab 01/06/21 1416 01/07/21 0447  NA 135 136  K 3.8 3.7  CL 99 102  CO2 24 25  GLUCOSE 77 123*  BUN 27* 14  CREATININE 1.02 0.63  CALCIUM 9.3 8.6*   GFR Estimated Creatinine Clearance: 128.2 mL/min (by C-G formula based on SCr of 0.63 mg/dL). Liver Function Tests: Recent Labs  Lab 01/06/21 1416  AST 23  ALT 19  ALKPHOS 49  BILITOT 0.9  PROT 7.7  ALBUMIN 3.8   No results for input(s): LIPASE, AMYLASE in the last 168 hours. No results for input(s): AMMONIA in the last 168 hours. Coagulation profile No results for input(s): INR, PROTIME in the last 168  hours.  CBC: Recent Labs  Lab 01/06/21 1416 01/07/21 0447  WBC 15.9* 11.4*  NEUTROABS 13.4*  --   HGB 11.8* 10.7*  HCT 33.9* 31.0*  MCV 92.1 91.7  PLT 219 180   Cardiac Enzymes: No results for input(s): CKTOTAL, CKMB, CKMBINDEX, TROPONINI in the last 168 hours. BNP (last 3 results) No results for input(s): PROBNP in the last 8760 hours. CBG: Recent Labs  Lab 01/06/21 1418 01/06/21 1909 01/06/21 2118 01/07/21 1039 01/07/21 1151  GLUCAP 72 98 157* 143* 154*   D-Dimer: No results for input(s): DDIMER in the last 72 hours. Hgb A1c: Recent Labs    01/06/21 1415  HGBA1C 6.1*   Lipid Profile: No results for input(s): CHOL, HDL, LDLCALC, TRIG, CHOLHDL, LDLDIRECT in the last 72 hours. Thyroid function studies: No results for input(s): TSH, T4TOTAL, T3FREE, THYROIDAB in the last 72 hours.  Invalid input(s): FREET3 Anemia work up: No results for input(s): VITAMINB12, FOLATE, FERRITIN, TIBC, IRON, RETICCTPCT in the last 72 hours. Sepsis Labs: Recent Labs  Lab 01/06/21 1416 01/07/21 0447  WBC 15.9* 11.4*  LATICACIDVEN 0.9  --     Microbiology Recent Results (from the past 240 hour(s))  Blood culture (routine x 2)     Status: None (Preliminary result)   Collection Time: 01/06/21  2:22 PM   Specimen: BLOOD  Result Value Ref Range Status   Specimen Description BLOOD LEFT ANTECUBITAL  Final   Special Requests   Final    BOTTLES DRAWN AEROBIC AND ANAEROBIC Blood Culture adequate volume   Culture   Final    NO GROWTH < 24 HOURS Performed at [facility], [address] [place], [place] [address]    Report Status PENDING  Incomplete  Blood culture (routine x 2)     Status: None (Preliminary result)   Collection Time: 01/06/21  2:22 PM   Specimen: BLOOD  Result Value Ref Range Status   Specimen Description BLOOD BLOOD RIGHT WRIST  Final   Special Requests   Final    BOTTLES DRAWN AEROBIC AND ANAEROBIC Blood Culture adequate volume   Culture   Final     NO GROWTH < 24 HOURS Performed at [facility], [address] [place], [place] [address]    Report Status PENDING  Incomplete  SARS Coronavirus 2 by RT PCR (hospital order, performed in [facility] hospital lab) Nasopharyngeal Nasopharyngeal Swab     Status: None   Collection Time: 01/06/21  3:26 PM   Specimen: Nasopharyngeal Swab  Result Value Ref Range Status   SARS Coronavirus 2 NEGATIVE NEGATIVE Final    Comment: (NOTE) SARS-CoV-2 target nucleic acids are NOT DETECTED.  The SARS-CoV-2 RNA is generally detectable in upper and lower respiratory specimens during the acute phase of infection. The lowest concentration of SARS-CoV-2 viral copies this assay can detect is 250 copies / mL. A negative result does not preclude SARS-CoV-2 infection and should not be used as the sole basis for treatment or other patient management decisions.  A negative result may occur with improper specimen collection / handling, submission of specimen other than nasopharyngeal swab, presence of viral mutation(s) within the areas targeted by this assay, and inadequate number of viral copies (<250 copies / mL). A negative result must be combined with clinical observations, patient history, and epidemiological information.  Fact Sheet for Patients:   BoilerBrush.com.cyhttps://www.fda.gov/media/136312/download  Fact Sheet for Healthcare Providers: https://pope.com/https://www.fda.gov/media/136313/download  This test is not yet approved or  cleared by the [place] FDA and has been authorized for detection and/or diagnosis of SARS-CoV-2 by FDA under an Emergency Use Authorization (EUA).  This EUA will remain in effect (meaning this test can be used)  for the duration of the COVID-19 declaration under Section 564(b)(1) of the Act, 21 U.S.C. section 360bbb-3(b)(1), unless the authorization is terminated or revoked sooner.  Performed at [facility], [address] [place], [place] [address]   Wound or Superficial  Culture     Status: None (Preliminary result)   Collection Time: 01/06/21  3:52 PM   Specimen: Toe; Wound  Result Value Ref Range Status   Specimen Description   Final    TOE LEFT Performed at [facility], [address] [place], [place] [address]    Special Requests   Final    NONE Performed at [facility], [address] [place], [place] [address]    Gram Stain   Final    FEW WBC PRESENT, PREDOMINANTLY PMN MODERATE GRAM POSITIVE COCCI    Culture   Final    TOO YOUNG TO READ Performed at [facility], [address], [place], [place] [address]    Report Status PENDING  Incomplete  Aerobic/Anaerobic Culture (surgical/deep wound)     Status: None (Preliminary result)   Collection Time: 01/06/21  3:52 PM   Specimen: Toe; Wound  Result Value Ref Range Status   Specimen Description   Final    TOE LEFT Performed at [facility], [address], [place], [place] [address]    Special Requests   Final    NONE Performed at [facility], [address] [place], [place] [address]    Gram Stain   Final    NO WBC SEEN FEW GRAM POSITIVE COCCI Performed at [facility], [address] [place], [place] [address]    Culture PENDING  Incomplete   Report Status PENDING  Incomplete  MRSA PCR Screening     Status: None   Collection Time: 01/06/21 10:30 PM   Specimen: Nasopharyngeal  Result Value Ref Range Status   MRSA by PCR NEGATIVE NEGATIVE Final    Comment:        The GeneXpert MRSA Assay (FDA approved for NASAL specimens only), is one component of a comprehensive MRSA colonization surveillance program. It is not intended to diagnose MRSA infection nor to guide or monitor treatment for MRSA infections. Performed at [facility], [address] [place], [place] [address]     Procedures and diagnostic studies:  MR FOOT LEFT WO CONTRAST  Result Date: 01/06/2021 CLINICAL DATA:  Worsening blistering along  the great toe. Chronic wound in this vicinity. EXAM: MRI OF THE LEFT FOOT WITHOUT CONTRAST TECHNIQUE: Multiplanar, multisequence MR imaging of the left foot was performed. No intravenous contrast was administered. COMPARISON:  Radiographs from 06/07/2020 FINDINGS: Bones/Joint/Cartilage Diffuse abnormal osseous edema in the proximal and distal phalanges of the great toe suspicious for osteomyelitis, with joint effusion of the interphalangeal joint of the great toe which could be septic and which may be draining out to the dorsal lateral cutaneous surface on images 10 through 11 of series 6. There is also a dorsal joint effusion of the first MTP joint. Valgus deformity at the interphalangeal joint of the great toe with extensive subcutaneous edema and a small amount of gas in the soft tissues suspicious for potential tissue necrosis. Abnormal edema signal is present in the phalanges of the second toe potentially with some deformity of the proximal phalanx, likewise suspicious for possible osteomyelitis in this setting. Equivocal edema signal in the distal phalanx of the third toe, with accentuated T2 signal but not accompanied by reduced  T1 signal, hence equivocal for osteomyelitis. Patchy marrow edema in the medial cuneiform, navicular, and along the base of the second metatarsal, probably degenerative. Ligaments The Lisfranc ligament appears grossly intact on image 11 of series 13 although there are regional degenerative findings and possibly mild erosive findings in this area. Muscles and Tendons Poor definition of the distal flexor hallucis longus tendon. Diffuse regional muscular edema, possibly neurogenic or from myositis. Soft tissues Dorsal subcutaneous edema tracking into the toes, likely from cellulitis. IMPRESSION: 1. Osteomyelitis of the great toe, second toe, and possibly distal phalanx of the third toe. Probable septic joint of the interphalangeal joint of the great toe, probably draining to the  cutaneous surface, with some gas in the adjacent soft tissues and extensive regional cellulitis. There is also an effusion of the first MTP joint, indeterminate for septic joint. 2. Subcutaneous edema favoring cellulitis in the foot. Edema within all visualized muscular structures, possibly neurogenic or from myositis. 3. Degenerative findings in the midfoot and along parts of the Lisfranc joint. Electronically Signed   By: Gaylyn Rong M.D.   On: 01/06/2021 18:53   DG Foot Complete Left  Result Date: 01/07/2021 CLINICAL DATA:  Postop amputation. EXAM: LEFT FOOT - COMPLETE 3+ VIEW COMPARISON:  None. FINDINGS: Surgical changes compatible with surgical amputations at the first metatarsal and second MTP joint. Remainder of the osseous structures appear intact and normally aligned. Expected postsurgical changes of the overlying soft tissues. Degenerative changes within the midfoot, with osseous spurring and subchondral cyst formation, moderate in degree. IMPRESSION: Surgical changes compatible with surgical amputations at the first metatarsal and second MTP joint. No evidence of surgical complicating feature. Electronically Signed   By: Bary Richard M.D.   On: 01/07/2021 11:08   DG Toe Great Left  Result Date: 01/06/2021 CLINICAL DATA:  Pt with recent hx of first and 2nd toe infection that healed with wound center. States that he has been having problems with left great toe X 2 weeks; worsening today. Swelling, redness and clear fluid leaking from toe. Patient complains of left toe wound that has been ongoing for 8 months. States that he has been seeing wound clinic and had healed up. States that around 13 days ago he started to noticed a blister forming on his left big toe EXAM: LEFT GREAT TOE COMPARISON:  06/07/2020 FINDINGS: There is bone resorption along the medial margin of the distal aspect of the proximal phalanx. There is also a apparent fracture along the plantar base of the distal phalanx with  associated focal osteopenia. Joint is normally aligned. There is surrounding soft tissue swelling with evidence of a soft tissue ulcer. There is a small amount of soft tissue air along the lateral aspect of the toe at the level of the IP joint. IMPRESSION: 1. Findings consistent with osteomyelitis involving the medial head of the proximal phalanx as well as the plantar base of the distal phalanx. The plantar base of the distal phalanx also shows a fracture, visualized on the lateral view only. There is adjacent soft tissue swelling and air. Consider septic arthritis of the IP joint in the proper clinical setting. Electronically Signed   By: Amie Portland M.D.   On: 01/06/2021 15:07               LOS: 1 day   Darald Uzzle  [facility]   Pager on [email or web address]. If 7PM-7AM, please contact night-coverage at [email or web address]     01/07/2021, 1:21 PM

## 2021-01-07 NOTE — H&P (Signed)
HISTORY AND PHYSICAL INTERVAL NOTE:  01/07/2021  9:07 AM  [name]  has presented today for surgery, with the diagnosis of Left foot infection.  The various methods of treatment have been discussed with the patient.  No guarantees were given.  After consideration of risks, benefits and other options for treatment, the patient has consented to surgery.  I have reviewed the patients' chart and labs.    PROCEDURE: LEFT PARTIAL 1ST RAY AMPUTATION, 2ND TOE AMPUTATION  A history and physical examination was performed in hospital.  The patient was reexamined.  There have been no changes to this history and physical examination.  Rosetta Posner, DPM

## 2021-01-07 NOTE — Anesthesia Postprocedure Evaluation (Signed)
Anesthesia Post Note  Patient: [name]  Procedure(s) Performed: AMPUTATION RAY - Left 1st and 2nd Ray (Left Foot)  Patient location during evaluation: PACU Anesthesia Type: General Level of consciousness: awake and alert Pain management: pain level controlled Vital Signs Assessment: post-procedure vital signs reviewed and stable Respiratory status: spontaneous breathing, nonlabored ventilation, respiratory function stable and patient connected to nasal cannula oxygen Cardiovascular status: blood pressure returned to baseline and stable Postop Assessment: no apparent nausea or vomiting Anesthetic complications: no   No complications documented.   Last Vitals:  Vitals:   01/07/21 1114 01/07/21 1122  BP: (!) 103/59 (!) 100/56  Pulse: 71 74  Resp: 18   Temp:  36.7 C  SpO2: 97% 92%    Last Pain:  Vitals:   01/07/21 1122  TempSrc:   PainSc: 0-No pain                 Cleda Mccreedy [place]

## 2021-01-08 ENCOUNTER — Encounter: Payer: Self-pay | Admitting: Podiatry

## 2021-01-08 LAB — CBC WITH DIFFERENTIAL/PLATELET
Abs Immature Granulocytes: 0.09 10*3/uL — ABNORMAL HIGH (ref 0.00–0.07)
Basophils Absolute: 0 10*3/uL (ref 0.0–0.1)
Basophils Relative: 0 %
Eosinophils Absolute: 0 10*3/uL (ref 0.0–0.5)
Eosinophils Relative: 0 %
HCT: 31.3 % — ABNORMAL LOW (ref 39.0–52.0)
Hemoglobin: 10.8 g/dL — ABNORMAL LOW (ref 13.0–17.0)
Immature Granulocytes: 1 %
Lymphocytes Relative: 9 %
Lymphs Abs: 1.3 10*3/uL (ref 0.7–4.0)
MCH: 31.8 pg (ref 26.0–34.0)
MCHC: 34.5 g/dL (ref 30.0–36.0)
MCV: 92.1 fL (ref 80.0–100.0)
Monocytes Absolute: 0.6 10*3/uL (ref 0.1–1.0)
Monocytes Relative: 5 %
Neutro Abs: 12.1 10*3/uL — ABNORMAL HIGH (ref 1.7–7.7)
Neutrophils Relative %: 85 %
Platelets: 199 10*3/uL (ref 150–400)
RBC: 3.4 MIL/uL — ABNORMAL LOW (ref 4.22–5.81)
RDW: 11.5 % (ref 11.5–15.5)
WBC: 14.1 10*3/uL — ABNORMAL HIGH (ref 4.0–10.5)
nRBC: 0 % (ref 0.0–0.2)

## 2021-01-08 LAB — GLUCOSE, CAPILLARY
Glucose-Capillary: 172 mg/dL — ABNORMAL HIGH (ref 70–99)
Glucose-Capillary: 256 mg/dL — ABNORMAL HIGH (ref 70–99)
Glucose-Capillary: 107 mg/dL — ABNORMAL HIGH (ref 70–99)
Glucose-Capillary: 265 mg/dL — ABNORMAL HIGH (ref 70–99)

## 2021-01-08 LAB — CREATININE, SERUM
Creatinine, Ser: 0.66 mg/dL (ref 0.61–1.24)
GFR, Estimated: >60 mL/min (ref >60)

## 2021-01-08 MED ORDER — BISACODYL 10 MG RE SUPP: 10.0000 mg | Freq: Once | RECTAL | Status: DC

## 2021-01-08 NOTE — Progress Notes (Signed)
PROGRESS NOTE    [name]  OFB:[id number] DOB: December 19, 1960 DOA: 01/06/2021 PCP: Patient, No Pcp Per   Brief Narrative: Taken from prior notes.  [name] is a 60 y.o. male with medical history significant forIDDM2, history of diabetic foot ulcer,obesity, who presented to the hospital because of worsening wound infection of the left great toe.  Presents emergency department for chief concerns of worsening wound infection of the left great toe.  He noticed redness, warmth and swelling in the area of the wound and this was associated with purulent drainage.  He was found to have acute osteomyelitis of the left great toe and second toe cellulitis of the left foot.  He was treated with IV fluids, analgesics and empiric IV antibiotics.  He was seen by the vascular surgeon who recommended outpatient follow-up.  He was seen in consultation by the podiatrist.  He underwent left partial first ray amputation and left second toe amputation on January 07, 2021.  Subjective: Patient was feeling better when seen today.  No new complaint.  Waiting for PT evaluation.  Wants to go home with home health services.  Assessment & Plan:   Principal Problem:   Osteomyelitis (HCC) Active Problems:   Obesity   Hyperlipidemia associated with type 2 diabetes mellitus (HCC)   Type 2 diabetes mellitus with diabetic neuropathy, with long-term current use of insulin (HCC)   Diabetic ulcer of toe of right foot associated with type 2 diabetes mellitus (HCC)   PAD (peripheral artery disease) (HCC)   Cellulitis of right lower extremity   Status post amputation of lesser toe of right foot (HCC)  Osteomyelitis of left great and second toe.  Associated with left foot cellulitis, s/p left partial first ray amputation and left second toe amputation on January 07, 2021. -Continue with IV antibiotics for now-will be discharged home on p.o. -PT evaluation-pending. -He will follow-up with vascular surgery as an  outpatient for PAD.  Type 2 diabetes mellitus with peripheral neuropathy.  Well-controlled with A1c of 6.1.  Metformin was discontinued and home dose of glipizide is on hold. -Continue Lantus and SSI  Hyperlipidemia. -Continue Lipitor  Stage II obesity. Body mass index is 31.69 kg/m.  Objective: Vitals:   01/08/21 0008 01/08/21 0431 01/08/21 0728 01/08/21 1136  BP: (!) 107/59 129/76 125/76 (!) 146/81  Pulse: 65 67 70 71  Resp: 16 16 17 18   Temp: 97.9 F (36.6 C) 97.6 F (36.4 C) 98.1 F (36.7 C) 98.3 F (36.8 C)  TempSrc:   Oral   SpO2: 95% 99% 97% 98%  Weight:      Height:        Intake/Output Summary (Last 24 hours) at 01/08/2021 1524 Last data filed at 01/08/2021 1500 Gross per 24 hour  Intake 969.61 ml  Output 825 ml  Net 144.61 ml   Filed Weights   01/06/21 1414  Weight: 106 kg    Examination:  General exam: Appears calm and comfortable  Respiratory system: Clear to auscultation. Respiratory effort normal. Cardiovascular system: S1 & S2 heard, RRR.  Gastrointestinal system: Soft, nontender, nondistended, bowel sounds positive. Central nervous system: Alert and oriented. No focal neurological deficits. Extremities: No edema, no cyanosis, pulses intact and symmetrical, left foot with Ace wrap. Psychiatry: Judgement and insight appear normal.    DVT prophylaxis: SCDs Code Status: Full Family Communication: Discussed with patient Disposition Plan:  Status is: Inpatient  Remains inpatient appropriate because:Inpatient level of care appropriate due to severity of illness   Dispo: The  patient is from: Home              Anticipated d/c is to: Home              Anticipated d/c date is: 1 day              Patient currently , medically stable, pending PT evaluation.   Difficult to place patient No              Level of care: Med-Surg  Consultants:   Podiatry  Vascular surgery  Procedures:  Antimicrobials:  Cefepime Vancomycin  Data Reviewed: I  have personally reviewed following labs and imaging studies  CBC: Recent Labs  Lab 01/06/21 1416 01/07/21 0447 01/08/21 0557  WBC 15.9* 11.4* 14.1*  NEUTROABS 13.4*  --  12.1*  HGB 11.8* 10.7* 10.8*  HCT 33.9* 31.0* 31.3*  MCV 92.1 91.7 92.1  PLT 219 180 199   Basic Metabolic Panel: Recent Labs  Lab 01/06/21 1416 01/07/21 0447 01/08/21 0557  NA 135 136  --   K 3.8 3.7  --   CL 99 102  --   CO2 24 25  --   GLUCOSE 77 123*  --   BUN 27* 14  --   CREATININE 1.02 0.63 0.66  CALCIUM 9.3 8.6*  --    GFR: Estimated Creatinine Clearance: 128.2 mL/min (by C-G formula based on SCr of 0.66 mg/dL). Liver Function Tests: Recent Labs  Lab 01/06/21 1416  AST 23  ALT 19  ALKPHOS 49  BILITOT 0.9  PROT 7.7  ALBUMIN 3.8   No results for input(s): LIPASE, AMYLASE in the last 168 hours. No results for input(s): AMMONIA in the last 168 hours. Coagulation Profile: No results for input(s): INR, PROTIME in the last 168 hours. Cardiac Enzymes: No results for input(s): CKTOTAL, CKMB, CKMBINDEX, TROPONINI in the last 168 hours. BNP (last 3 results) No results for input(s): PROBNP in the last 8760 hours. HbA1C: Recent Labs    01/06/21 1415  HGBA1C 6.1*   CBG: Recent Labs  Lab 01/07/21 1151 01/07/21 1629 01/07/21 2047 01/08/21 0729 01/08/21 1135  GLUCAP 154* 316* 335* 172* 256*   Lipid Profile: No results for input(s): CHOL, HDL, LDLCALC, TRIG, CHOLHDL, LDLDIRECT in the last 72 hours. Thyroid Function Tests: No results for input(s): TSH, T4TOTAL, FREET4, T3FREE, THYROIDAB in the last 72 hours. Anemia Panel: No results for input(s): VITAMINB12, FOLATE, FERRITIN, TIBC, IRON, RETICCTPCT in the last 72 hours. Sepsis Labs: Recent Labs  Lab 01/06/21 1416  LATICACIDVEN 0.9    Recent Results (from the past 240 hour(s))  Blood culture (routine x 2)     Status: None (Preliminary result)   Collection Time: 01/06/21  2:22 PM   Specimen: BLOOD  Result Value Ref Range Status    Specimen Description BLOOD LEFT ANTECUBITAL  Final   Special Requests   Final    BOTTLES DRAWN AEROBIC AND ANAEROBIC Blood Culture adequate volume   Culture   Final    NO GROWTH 2 DAYS Performed at [facility], [address], [place], [place] [address]    Report Status PENDING  Incomplete  Blood culture (routine x 2)     Status: None (Preliminary result)   Collection Time: 01/06/21  2:22 PM   Specimen: BLOOD  Result Value Ref Range Status   Specimen Description BLOOD BLOOD RIGHT WRIST  Final   Special Requests   Final    BOTTLES DRAWN AEROBIC AND ANAEROBIC Blood Culture adequate volume  Culture   Final    NO GROWTH 2 DAYS Performed at [facility], [address] [place], [place] [address]    Report Status PENDING  Incomplete  SARS Coronavirus 2 by RT PCR (hospital order, performed in [facility] hospital lab) Nasopharyngeal Nasopharyngeal Swab     Status: None   Collection Time: 01/06/21  3:26 PM   Specimen: Nasopharyngeal Swab  Result Value Ref Range Status   SARS Coronavirus 2 NEGATIVE NEGATIVE Final    Comment: (NOTE) SARS-CoV-2 target nucleic acids are NOT DETECTED.  The SARS-CoV-2 RNA is generally detectable in upper and lower respiratory specimens during the acute phase of infection. The lowest concentration of SARS-CoV-2 viral copies this assay can detect is 250 copies / mL. A negative result does not preclude SARS-CoV-2 infection and should not be used as the sole basis for treatment or other patient management decisions.  A negative result may occur with improper specimen collection / handling, submission of specimen other than nasopharyngeal swab, presence of viral mutation(s) within the areas targeted by this assay, and inadequate number of viral copies (<250 copies / mL). A negative result must be combined with clinical observations, patient history, and epidemiological information.  Fact Sheet for Patients:    BoilerBrush.com.cy  Fact Sheet for Healthcare Providers: https://pope.com/  This test is not yet approved or  cleared by the [place] FDA and has been authorized for detection and/or diagnosis of SARS-CoV-2 by FDA under an Emergency Use Authorization (EUA).  This EUA will remain in effect (meaning this test can be used) for the duration of the COVID-19 declaration under Section 564(b)(1) of the Act, 21 U.S.C. section 360bbb-3(b)(1), unless the authorization is terminated or revoked sooner.  Performed at [facility], [address] [place], [place] [address]   Wound or Superficial Culture     Status: None (Preliminary result)   Collection Time: 01/06/21  3:52 PM   Specimen: Toe; Wound  Result Value Ref Range Status   Specimen Description   Final    TOE LEFT Performed at [facility], [address], [place], [place] [address]    Special Requests   Final    NONE Performed at [facility], [address] [place], [place] [address]    Gram Stain   Final    FEW WBC PRESENT, PREDOMINANTLY PMN MODERATE GRAM POSITIVE COCCI Performed at [facility], [address] [place], [place] [address]    Culture ABUNDANT STAPHYLOCOCCUS AUREUS  Final   Report Status PENDING  Incomplete  Aerobic/Anaerobic Culture (surgical/deep wound)     Status: None (Preliminary result)   Collection Time: 01/06/21  3:52 PM   Specimen: Toe; Wound  Result Value Ref Range Status   Specimen Description   Final    TOE LEFT Performed at [facility], [address], [place], [place] [address]    Special Requests   Final    NONE Performed at [facility], [address] [place], [place] [address]    Gram Stain NO WBC SEEN FEW GRAM POSITIVE COCCI   Final   Culture   Final    FEW STAPHYLOCOCCUS AUREUS SUSCEPTIBILITIES TO FOLLOW Performed at [facility], [address], [place],  [place] [address]    Report Status PENDING  Incomplete  MRSA PCR Screening     Status: None   Collection Time: 01/06/21 10:30 PM   Specimen: Nasopharyngeal  Result Value Ref Range Status   MRSA by  PCR NEGATIVE NEGATIVE Final    Comment:        The GeneXpert MRSA Assay (FDA approved for NASAL specimens only), is one component of a comprehensive MRSA colonization surveillance program. It is not intended to diagnose MRSA infection nor to guide or monitor treatment for MRSA infections. Performed at [facility], [address] [place], [place] [address]   Aerobic/Anaerobic Culture (surgical/deep wound)     Status: None (Preliminary result)   Collection Time: 01/07/21  9:31 AM   Specimen: PATH Other; Tissue  Result Value Ref Range Status   Specimen Description   Final    ABSCESS LEFT BIG TOE Performed at [facility], [address] [place], [place] [address]    Special Requests   Final    NONE Performed at [facility], [address] [place], [place] [address]    Gram Stain   Final    RARE WBC PRESENT, PREDOMINANTLY PMN RARE GRAM POSITIVE COCCI IN CLUSTERS Performed at [facility], [address] [place], [place] [address]    Culture FEW STAPHYLOCOCCUS AUREUS  Final   Report Status PENDING  Incomplete  Aerobic/Anaerobic Culture (surgical/deep wound)     Status: None (Preliminary result)   Collection Time: 01/07/21  9:41 AM   Specimen: PATH Bone biopsy; Tissue  Result Value Ref Range Status   Specimen Description   Final    BONE BONE CULTURE L BIG TOE Performed at [facility], [address] [place], [place] [address]    Special Requests   Final    NONE Performed at [facility], [address] [place], [place] [address]    Gram Stain   Final    FEW WBC PRESENT,BOTH PMN AND MONONUCLEAR FEW GRAM POSITIVE COCCI IN PAIRS Performed at [facility], [address] [place], [place] [address]    Culture  MODERATE STAPHYLOCOCCUS AUREUS  Final   Report Status PENDING  Incomplete     Radiology Studies: MR FOOT LEFT WO CONTRAST  Result Date: 01/06/2021 CLINICAL DATA:  Worsening blistering along the great toe. Chronic wound in this vicinity. EXAM: MRI OF THE LEFT FOOT WITHOUT CONTRAST TECHNIQUE: Multiplanar, multisequence MR imaging of the left foot was performed. No intravenous contrast was administered. COMPARISON:  Radiographs from 06/07/2020 FINDINGS: Bones/Joint/Cartilage Diffuse abnormal osseous edema in the proximal and distal phalanges of the great toe suspicious for osteomyelitis, with joint effusion of the interphalangeal joint of the great toe which could be septic and which may be draining out to the dorsal lateral cutaneous surface on images 10 through 11 of series 6. There is also a dorsal joint effusion of the first MTP joint. Valgus deformity at the interphalangeal joint of the great toe with extensive subcutaneous edema and a small amount of gas in the soft tissues suspicious for potential tissue necrosis. Abnormal edema signal is present in the phalanges of the second toe potentially with some deformity of the proximal phalanx, likewise suspicious for possible osteomyelitis in this setting. Equivocal edema signal in the distal phalanx of the third toe, with accentuated T2 signal but not accompanied by reduced T1 signal, hence equivocal for osteomyelitis. Patchy marrow edema in the medial cuneiform, navicular, and along the base of the second metatarsal, probably degenerative. Ligaments The Lisfranc ligament appears grossly intact on image 11 of series 13 although there are regional degenerative findings and possibly mild erosive findings in this area. Muscles and Tendons Poor definition of the distal flexor hallucis  longus tendon. Diffuse regional muscular edema, possibly neurogenic or from myositis. Soft tissues Dorsal subcutaneous edema tracking into the toes, likely from cellulitis.  IMPRESSION: 1. Osteomyelitis of the great toe, second toe, and possibly distal phalanx of the third toe. Probable septic joint of the interphalangeal joint of the great toe, probably draining to the cutaneous surface, with some gas in the adjacent soft tissues and extensive regional cellulitis. There is also an effusion of the first MTP joint, indeterminate for septic joint. 2. Subcutaneous edema favoring cellulitis in the foot. Edema within all visualized muscular structures, possibly neurogenic or from myositis. 3. Degenerative findings in the midfoot and along parts of the Lisfranc joint. Electronically Signed   By: Gaylyn Rong M.D.   On: 01/06/2021 18:53   DG Foot Complete Left  Result Date: 01/07/2021 CLINICAL DATA:  Postop amputation. EXAM: LEFT FOOT - COMPLETE 3+ VIEW COMPARISON:  None. FINDINGS: Surgical changes compatible with surgical amputations at the first metatarsal and second MTP joint. Remainder of the osseous structures appear intact and normally aligned. Expected postsurgical changes of the overlying soft tissues. Degenerative changes within the midfoot, with osseous spurring and subchondral cyst formation, moderate in degree. IMPRESSION: Surgical changes compatible with surgical amputations at the first metatarsal and second MTP joint. No evidence of surgical complicating feature. Electronically Signed   By: Bary Richard M.D.   On: 01/07/2021 11:08    Scheduled Meds: . atorvastatin  20 mg Oral QODAY  . chlorhexidine  60 mL Topical Once  . insulin aspart  0-15 Units Subcutaneous TID WC  . insulin aspart  0-5 Units Subcutaneous QHS  . insulin glargine  18 Units Subcutaneous QHS  . povidone-iodine  2 application Topical Once   Continuous Infusions: . sodium chloride 250 mL (01/08/21 0550)  . ceFEPime (MAXIPIME) IV 2 g (01/08/21 1452)  . vancomycin 1,500 mg (01/08/21 0554)     LOS: 2 days   Time spent: 30  minutes.  Arnetha Courser, MD [facility]  If 7PM-7AM,  please contact night-coverage [email or web address]  01/08/2021, 3:24 PM   This record has been created using Conservation officer, historic buildings. Errors have been sought and corrected,but may not always be located. Such creation errors do not reflect on the standard of care.

## 2021-01-08 NOTE — Evaluation (Signed)
Physical Therapy Evaluation Patient Details Name: [name] MRN: [id number] DOB: [birth year] Today's Date: 01/08/2021   History of Present Illness  Pt is a 60 y.o. male presenting to hospital 1/28 with L toe wound.  S/p 1/29 L partial first ray amputation and L 2nd toe amputation.  PMH includes COVID-19, DM, osteomyelitis L foot, R toe amp 10/2020, h/o R elbow fx surgery.  Clinical Impression  Prior to hospital admission, pt was independent with ambulation; lives alone in 1 level home with 3 STE B railings.  Currently pt is modified independent with bed mobility; SBA with transfers; and CGA ambulating 40 feet with RW (limited distance ambulating per order for short distances but pt demonstrated ability to walk further if needed).  Pt initially requiring vc's for technique for PWB'ing status and heel contact (wearing surgical shoe) but pt able to maintain with practice.  Pt declined to trial stairs (pt reporting no concerns) so therapist discussed/educated pt on safe technique and how to maintain L LE precautions ascending/descend steps (pt verbalizing appropriate understanding).  Pt would benefit from skilled PT to address noted impairments and functional limitations (see below for any additional details).  Upon hospital discharge, pt would benefit from HHPT (pt reporting that he does not want HHPT though--MD notified).    Follow Up Recommendations Home health PT    Equipment Recommendations  Rolling walker with 5" wheels (pt reports having RW already)    Recommendations for Other Services       Precautions / Restrictions Precautions Precautions: Fall Precaution Comments: L LE elevated on 2 pillows Restrictions Weight Bearing Restrictions: Yes LLE Weight Bearing: Partial weight bearing LLE Partial Weight Bearing Percentage or Pounds: L LE PWB in surgical shoe; heel contact only; for short distances      Mobility  Bed Mobility Overal bed mobility: Modified Independent              General bed mobility comments: Semi-supine to/from sitting edge of bed without any noted difficulties    Transfers Overall transfer level: Needs assistance Equipment used: Rolling walker (2 wheeled) Transfers: Sit to/from Stand Sit to Stand: Supervision         General transfer comment: x2 trials standing from bed; fairly strong stand and controlled descent noted  Ambulation/Gait Ambulation/Gait assistance: Min guard Gait Distance (Feet): 40 Feet Assistive device: Rolling walker (2 wheeled) Gait Pattern/deviations: Step-to pattern Gait velocity: decreased   General Gait Details: initial vc's for PWB'ing status and heel contact technique; almost step to gait pattern  Stairs Stairs:  (Pt declined stairs.  Pt educated on sidestepping technique using L railing ascending (and same railing) descending or using B railings forwards descending with maintaining of PWB'ing status and heel contact: pt verbalizing appropriate understanding.)          Wheelchair Mobility    Modified Rankin (Stroke Patients Only)       Balance Overall balance assessment: Needs assistance Sitting-balance support: No upper extremity supported;Feet supported Sitting balance-Leahy Scale: Normal Sitting balance - Comments: steady sitting reaching outside BOS   Standing balance support: No upper extremity supported Standing balance-Leahy Scale: Good Standing balance comment: steady standing reaching within BOS                             Pertinent Vitals/Pain Pain Assessment: No/denies pain  Vitals (HR and O2 on room air) stable and WFL throughout treatment session.    Home Living Family/patient expects to be discharged to::  Private residence Living Arrangements: Alone   Type of Home: Mobile home Home Access: Stairs to enter Entrance Stairs-Rails: Right;Left;Can reach both Entrance Stairs-Number of Steps: 3 Home Layout: One level Home Equipment: Cane - single point;Walker - 2  wheels      Prior Function Level of Independence: Independent         Comments: Pt reports no recent falls,     Hand Dominance   Dominant Hand: Right    Extremity/Trunk Assessment   Upper Extremity Assessment Upper Extremity Assessment: Overall WFL for tasks assessed    Lower Extremity Assessment Lower Extremity Assessment: LLE deficits/detail (R LE WFL) LLE Deficits / Details: at least 3/5 hip flexion, knee flexion/extension, and DF/PF    Cervical / Trunk Assessment Cervical / Trunk Assessment: Normal  Communication   Communication: No difficulties  Cognition Arousal/Alertness: Awake/alert Behavior During Therapy: WFL for tasks assessed/performed Overall Cognitive Status: Within Functional Limits for tasks assessed                                        General Comments General comments (skin integrity, edema, etc.): L foot dressing in place; no drainage noted beginning/end of session.  Nursing cleared pt for participation in physical therapy.  Pt agreeable to PT session.    Exercises  Transfer and gait training   Assessment/Plan    PT Assessment Patient needs continued PT services  PT Problem List Decreased strength;Decreased activity tolerance;Decreased balance;Decreased mobility;Decreased knowledge of use of DME;Decreased knowledge of precautions;Pain;Decreased skin integrity       PT Treatment Interventions DME instruction;Gait training;Stair training;Functional mobility training;Therapeutic activities;Therapeutic exercise;Balance training;Patient/family education    PT Goals (Current goals can be found in the Care Plan section)  Acute Rehab PT Goals Patient Stated Goal: to go home PT Goal Formulation: With patient Time For Goal Achievement: 01/22/21 Potential to Achieve Goals: Good    Frequency Min 2X/week   Barriers to discharge        Co-evaluation               AM-PAC PT "6 Clicks" Mobility  Outcome Measure Help  needed turning from your back to your side while in a flat bed without using bedrails?: None Help needed moving from lying on your back to sitting on the side of a flat bed without using bedrails?: None Help needed moving to and from a bed to a chair (including a wheelchair)?: A Little Help needed standing up from a chair using your arms (e.g., wheelchair or bedside chair)?: A Little Help needed to walk in hospital room?: A Little Help needed climbing 3-5 steps with a railing? : A Little 6 Click Score: 20    End of Session Equipment Utilized During Treatment: Gait belt;Other (comment) (L post op shoe) Activity Tolerance: Patient tolerated treatment well Patient left: in bed;with call bell/phone within reach;Other (comment) (L LE elevated) Nurse Communication: Mobility status;Precautions;Weight bearing status PT Visit Diagnosis: Other abnormalities of gait and mobility (R26.89);Difficulty in walking, not elsewhere classified (R26.2);Pain Pain - Right/Left: Left Pain - part of body: Ankle and joints of foot    Time: 1510-1537 PT Time Calculation (min) (ACUTE ONLY): 27 min   Charges:   PT Evaluation $PT Eval Low Complexity: 1 Low PT Treatments $Gait Training: 8-22 mins       Hendricks Limes, PT 01/08/21, 5:00 PM

## 2021-01-09 DIAGNOSIS — E11621 Type 2 diabetes mellitus with foot ulcer: Secondary | ICD-10-CM

## 2021-01-09 DIAGNOSIS — M869 Osteomyelitis, unspecified: Secondary | ICD-10-CM

## 2021-01-09 DIAGNOSIS — L97519 Non-pressure chronic ulcer of other part of right foot with unspecified severity: Secondary | ICD-10-CM

## 2021-01-09 LAB — AEROBIC CULTURE W GRAM STAIN (SUPERFICIAL SPECIMEN)

## 2021-01-09 LAB — GLUCOSE, CAPILLARY
Glucose-Capillary: 131 mg/dL — ABNORMAL HIGH (ref 70–99)
Glucose-Capillary: 166 mg/dL — ABNORMAL HIGH (ref 70–99)

## 2021-01-09 SURGERY — LOWER EXTREMITY ANGIOGRAPHY
Anesthesia: Moderate Sedation | Laterality: Left

## 2021-01-09 MED ORDER — CEPHALEXIN 500 MG PO CAPS: 500.0000 mg | ORAL_CAPSULE | Freq: Three times a day (TID) | ORAL | 0 refills | Status: AC

## 2021-01-09 MED ORDER — OXYCODONE-ACETAMINOPHEN 5-325 MG PO TABS: 1.0000 | ORAL_TABLET | Freq: Four times a day (QID) | ORAL | 0 refills | Status: DC | PRN

## 2021-01-09 NOTE — Discharge Summary (Signed)
Physician Discharge Summary  Tip Atienza KKX:[id number] DOB: October 06, 1961 DOA: 01/06/2021  PCP: Patient, No Pcp Per  Admit date: 01/06/2021 Discharge date: 01/09/2021  Admitted From: Home Disposition: Home  Recommendations for Outpatient Follow-up:  1. Follow up with PCP in 1-2 weeks 2. Follow-up with podiatry 3. Please obtain BMP/CBC in one week 4. Please follow up on the following pending results: None  Home Health: Yes Equipment/Devices: Rolling walker  discharge Condition: Stable CODE STATUS: Full Diet recommendation: Heart Healthy / Carb Modified \  Brief/Interim Summary: Tobias Norrisis a 60 y.o.malewith medical history significant forIDDM2, history of diabetic foot ulcer,obesity,who presented to the hospital because of worsening wound infection of the left great toe. Presents emergency department for chief concerns of worsening wound infection of theleftgreat toe.He noticed redness, warmth and swelling in the area of the wound and this was associated with purulent drainage.  He was found to have acute osteomyelitis of the left great toe and second toe cellulitis of the left foot. He was treated with IV fluids, analgesics and empiric IV antibiotics. He was seen in consultation by the podiatrist. He underwent left partial first ray amputation and left second toe amputation on January 07, 2021. Tolerated the procedure well.  Bone cultures grew MSSA.  He received cefepime and vancomycin while in the hospital and discharged on Keflex for 7 more days according to the advice of his podiatrist.  He will follow-up with them as an outpatient.  He will be weightbearing only on heel.  Patient refused home health services, stating that he knows how to take care of his recent surgery as he did before for the other 4  Patient also need evaluation for peripheral arterial disease.  Vascular surgery was consulted and they were recommending outpatient follow-up.  He will continue with  rest of his home medications and follow-up with his primary care provider for further management of his chronic conditions.  Discharge Diagnoses:  Principal Problem:   Osteomyelitis of great toe of left foot (HCC) Active Problems:   Obesity   Hyperlipidemia associated with type 2 diabetes mellitus (HCC)   Type 2 diabetes mellitus with diabetic neuropathy, with long-term current use of insulin (HCC)   Diabetic ulcer of toe of right foot associated with type 2 diabetes mellitus (HCC)   PAD (peripheral artery disease) (HCC)   Cellulitis of right lower extremity   Status post amputation of lesser toe of right foot [facility])   Discharge Instructions  Discharge Instructions    Diet - low sodium heart healthy   Complete by: As directed    Discharge instructions   Complete by: As directed    It was pleasure taking care of you. You are being given antibiotics for 7 more days according to the advice of your surgeon, keep your dressing clean and change if gets soiled. Follow-up with your surgeon. You are also being given pain medications to be used only for severe pain.  You can try Tylenol or ibuprofen first and if that does not work you can take these pain pills.  Please be mindful that it can cause constipation and make you drowsy. Follow-up with primary care provider for further management of your chronic conditions.   Discharge wound care:   Complete by: As directed    Keep dressing clean and dry, change or reinforce if needed   Increase activity slowly   Complete by: As directed      Allergies as of 01/09/2021   No Known Allergies  Medication List    TAKE these medications   aspirin EC 325 MG tablet Take 325 mg by mouth daily.   atorvastatin 20 MG tablet Commonly known as: LIPITOR Take 1 tablet (20 mg total) by mouth every other day.   cephALEXin 500 MG capsule Commonly known as: KEFLEX Take 1 capsule (500 mg total) by mouth 3 (three) times daily for 7 days.   Cinnamon  500 MG capsule Take 500 mg by mouth daily.   Garlic 1000 MG Caps Take 1,000 mg by mouth daily.   glipiZIDE 5 MG tablet Commonly known as: GLUCOTROL Take 1 tablet (5 mg total) by mouth 2 (two) times daily before a meal.   metFORMIN 1000 MG tablet Commonly known as: GLUCOPHAGE Take 1 tablet (1,000 mg total) by mouth 2 (two) times daily with a meal.   oxyCODONE-acetaminophen 5-325 MG tablet Commonly known as: PERCOCET/ROXICET Take 1 tablet by mouth every 6 (six) hours as needed for moderate pain. What changed:   when to take this  reasons to take this   Tresiba FlexTouch 100 UNIT/ML FlexTouch Pen Generic drug: insulin degludec Inject 18 Units into the skin daily.   VITAMIN C CR 1500 MG Tbcr Take 1,500 mg by mouth daily.            Discharge Care Instructions  (From admission, onward)         Start     Ordered   01/09/21 0000  Discharge wound care:       Comments: Keep dressing clean and dry, change or reinforce if needed   01/09/21 1155          Follow-up Information    Rosetta Posner, DPM. Schedule an appointment as soon as possible for a visit in 1 week(s).   Specialty: Podiatry Contact information: [address] [place] [address] [phone]              No Known Allergies  Consultations:  Orthopedic surgery  Procedures/Studies: MR FOOT LEFT WO CONTRAST  Result Date: 01/06/2021 CLINICAL DATA:  Worsening blistering along the great toe. Chronic wound in this vicinity. EXAM: MRI OF THE LEFT FOOT WITHOUT CONTRAST TECHNIQUE: Multiplanar, multisequence MR imaging of the left foot was performed. No intravenous contrast was administered. COMPARISON:  Radiographs from 06/07/2020 FINDINGS: Bones/Joint/Cartilage Diffuse abnormal osseous edema in the proximal and distal phalanges of the great toe suspicious for osteomyelitis, with joint effusion of the interphalangeal joint of the great toe which could be septic and which may be draining out to  the dorsal lateral cutaneous surface on images 10 through 11 of series 6. There is also a dorsal joint effusion of the first MTP joint. Valgus deformity at the interphalangeal joint of the great toe with extensive subcutaneous edema and a small amount of gas in the soft tissues suspicious for potential tissue necrosis. Abnormal edema signal is present in the phalanges of the second toe potentially with some deformity of the proximal phalanx, likewise suspicious for possible osteomyelitis in this setting. Equivocal edema signal in the distal phalanx of the third toe, with accentuated T2 signal but not accompanied by reduced T1 signal, hence equivocal for osteomyelitis. Patchy marrow edema in the medial cuneiform, navicular, and along the base of the second metatarsal, probably degenerative. Ligaments The Lisfranc ligament appears grossly intact on image 11 of series 13 although there are regional degenerative findings and possibly mild erosive findings in this area. Muscles and Tendons Poor definition of the distal flexor hallucis longus tendon. Diffuse  regional muscular edema, possibly neurogenic or from myositis. Soft tissues Dorsal subcutaneous edema tracking into the toes, likely from cellulitis. IMPRESSION: 1. Osteomyelitis of the great toe, second toe, and possibly distal phalanx of the third toe. Probable septic joint of the interphalangeal joint of the great toe, probably draining to the cutaneous surface, with some gas in the adjacent soft tissues and extensive regional cellulitis. There is also an effusion of the first MTP joint, indeterminate for septic joint. 2. Subcutaneous edema favoring cellulitis in the foot. Edema within all visualized muscular structures, possibly neurogenic or from myositis. 3. Degenerative findings in the midfoot and along parts of the Lisfranc joint. Electronically Signed   By: Gaylyn Rong M.D.   On: 01/06/2021 18:53   DG Foot Complete Left  Result Date:  01/07/2021 CLINICAL DATA:  Postop amputation. EXAM: LEFT FOOT - COMPLETE 3+ VIEW COMPARISON:  None. FINDINGS: Surgical changes compatible with surgical amputations at the first metatarsal and second MTP joint. Remainder of the osseous structures appear intact and normally aligned. Expected postsurgical changes of the overlying soft tissues. Degenerative changes within the midfoot, with osseous spurring and subchondral cyst formation, moderate in degree. IMPRESSION: Surgical changes compatible with surgical amputations at the first metatarsal and second MTP joint. No evidence of surgical complicating feature. Electronically Signed   By: Bary Richard M.D.   On: 01/07/2021 11:08   DG Toe Great Left  Result Date: 01/06/2021 CLINICAL DATA:  Pt with recent hx of first and 2nd toe infection that healed with wound center. States that he has been having problems with left great toe X 2 weeks; worsening today. Swelling, redness and clear fluid leaking from toe. Patient complains of left toe wound that has been ongoing for 8 months. States that he has been seeing wound clinic and had healed up. States that around 13 days ago he started to noticed a blister forming on his left big toe EXAM: LEFT GREAT TOE COMPARISON:  06/07/2020 FINDINGS: There is bone resorption along the medial margin of the distal aspect of the proximal phalanx. There is also a apparent fracture along the plantar base of the distal phalanx with associated focal osteopenia. Joint is normally aligned. There is surrounding soft tissue swelling with evidence of a soft tissue ulcer. There is a small amount of soft tissue air along the lateral aspect of the toe at the level of the IP joint. IMPRESSION: 1. Findings consistent with osteomyelitis involving the medial head of the proximal phalanx as well as the plantar base of the distal phalanx. The plantar base of the distal phalanx also shows a fracture, visualized on the lateral view only. There is adjacent  soft tissue swelling and air. Consider septic arthritis of the IP joint in the proper clinical setting. Electronically Signed   By: Amie Portland M.D.   On: 01/06/2021 15:07    Subjective: Patient has no new complaint when seen today.  He wants to drive himself, stating that he has no problem with driving and his car is in the parking lot. He does not want any home health services either.  Discharge Exam: Vitals:   01/09/21 0754 01/09/21 1129  BP: (!) 160/79 (!) 162/92  Pulse: 82 84  Resp: 17 17  Temp: 98.2 F (36.8 C) 97.9 F (36.6 C)  SpO2: 96% 97%   Vitals:   01/09/21 0029 01/09/21 0527 01/09/21 0754 01/09/21 1129  BP: 138/70 (!) 152/85 (!) 160/79 (!) 162/92  Pulse: 79 81 82 84  Resp: 17  16 17 17   Temp: 98.4 F (36.9 C) 98.6 F (37 C) 98.2 F (36.8 C) 97.9 F (36.6 C)  TempSrc: Oral Oral    SpO2: 93% 94% 96% 97%  Weight:      Height:        General: Pt is alert, awake, not in acute distress Cardiovascular: RRR, S1/S2 +, no rubs, no gallops Respiratory: CTA bilaterally, no wheezing, no rhonchi Abdominal: Soft, NT, ND, bowel sounds + Extremities: no edema, no cyanosis, left foot in boot.   The results of significant diagnostics from this hospitalization (including imaging, microbiology, ancillary and laboratory) are listed below for reference.    Microbiology: Recent Results (from the past 240 hour(s))  Blood culture (routine x 2)     Status: None (Preliminary result)   Collection Time: 01/06/21  2:22 PM   Specimen: BLOOD  Result Value Ref Range Status   Specimen Description BLOOD LEFT ANTECUBITAL  Final   Special Requests   Final    BOTTLES DRAWN AEROBIC AND ANAEROBIC Blood Culture adequate volume   Culture   Final    NO GROWTH 3 DAYS Performed at [facility], [address], [place], [place] [address]    Report Status PENDING  Incomplete  Blood culture (routine x 2)     Status: None (Preliminary result)   Collection Time: 01/06/21  2:22 PM    Specimen: BLOOD  Result Value Ref Range Status   Specimen Description BLOOD BLOOD RIGHT WRIST  Final   Special Requests   Final    BOTTLES DRAWN AEROBIC AND ANAEROBIC Blood Culture adequate volume   Culture   Final    NO GROWTH 3 DAYS Performed at [facility], [address], [place], [place] [address]    Report Status PENDING  Incomplete  SARS Coronavirus 2 by RT PCR (hospital order, performed in [facility] hospital lab) Nasopharyngeal Nasopharyngeal Swab     Status: None   Collection Time: 01/06/21  3:26 PM   Specimen: Nasopharyngeal Swab  Result Value Ref Range Status   SARS Coronavirus 2 NEGATIVE NEGATIVE Final    Comment: (NOTE) SARS-CoV-2 target nucleic acids are NOT DETECTED.  The SARS-CoV-2 RNA is generally detectable in upper and lower respiratory specimens during the acute phase of infection. The lowest concentration of SARS-CoV-2 viral copies this assay can detect is 250 copies / mL. A negative result does not preclude SARS-CoV-2 infection and should not be used as the sole basis for treatment or other patient management decisions.  A negative result may occur with improper specimen collection / handling, submission of specimen other than nasopharyngeal swab, presence of viral mutation(s) within the areas targeted by this assay, and inadequate number of viral copies (<250 copies / mL). A negative result must be combined with clinical observations, patient history, and epidemiological information.  Fact Sheet for Patients:   BoilerBrush.com.cy  Fact Sheet for Healthcare Providers: https://pope.com/  This test is not yet approved or  cleared by the [place] FDA and has been authorized for detection and/or diagnosis of SARS-CoV-2 by FDA under an Emergency Use Authorization (EUA).  This EUA will remain in effect (meaning this test can be used) for the duration of the COVID-19 declaration under Section  564(b)(1) of the Act, 21 U.S.C. section 360bbb-3(b)(1), unless the authorization is terminated or revoked sooner.  Performed at [facility], [address] [place], [place] [address]   Wound or Superficial Culture     Status: None   Collection Time:  01/06/21  3:52 PM   Specimen: Toe; Wound  Result Value Ref Range Status   Specimen Description   Final    TOE LEFT Performed at [facility], [address] [place], [place] [address]    Special Requests   Final    NONE Performed at [facility], [address] [place], [place] [address]    Gram Stain   Final    FEW WBC PRESENT, PREDOMINANTLY PMN MODERATE GRAM POSITIVE COCCI Performed at [facility], [address], [place], [place] [address]    Culture ABUNDANT STAPHYLOCOCCUS AUREUS  Final   Report Status 01/09/2021 FINAL  Final   Organism ID, Bacteria STAPHYLOCOCCUS AUREUS  Final      Susceptibility   Staphylococcus aureus - MIC*    CIPROFLOXACIN <=0.5 SENSITIVE Sensitive     ERYTHROMYCIN <=0.25 SENSITIVE Sensitive     GENTAMICIN <=0.5 SENSITIVE Sensitive     OXACILLIN <=0.25 SENSITIVE Sensitive     TETRACYCLINE <=1 SENSITIVE Sensitive     VANCOMYCIN 1 SENSITIVE Sensitive     TRIMETH/SULFA <=10 SENSITIVE Sensitive     CLINDAMYCIN <=0.25 SENSITIVE Sensitive     RIFAMPIN <=0.5 SENSITIVE Sensitive     Inducible Clindamycin NEGATIVE Sensitive     * ABUNDANT STAPHYLOCOCCUS AUREUS  Aerobic/Anaerobic Culture (surgical/deep wound)     Status: None (Preliminary result)   Collection Time: 01/06/21  3:52 PM   Specimen: Toe; Wound  Result Value Ref Range Status   Specimen Description   Final    TOE LEFT Performed at [facility], [address], [place], [place] [address]    Special Requests   Final    NONE Performed at [facility], [address], [place], [place] [address]    Gram Stain   Final    NO WBC SEEN FEW GRAM POSITIVE COCCI Performed at [facility], [address], [place], [place] [address]    Culture   Final    FEW STAPHYLOCOCCUS AUREUS NO ANAEROBES ISOLATED; CULTURE IN PROGRESS FOR 5 DAYS    Report Status PENDING  Incomplete   Organism ID, Bacteria STAPHYLOCOCCUS AUREUS  Final      Susceptibility   Staphylococcus aureus - MIC*    CIPROFLOXACIN <=0.5 SENSITIVE Sensitive     ERYTHROMYCIN <=0.25 SENSITIVE Sensitive     GENTAMICIN <=0.5 SENSITIVE Sensitive     OXACILLIN 0.5 SENSITIVE Sensitive     TETRACYCLINE <=1 SENSITIVE Sensitive     VANCOMYCIN <=0.5 SENSITIVE Sensitive     TRIMETH/SULFA <=10 SENSITIVE Sensitive     CLINDAMYCIN <=0.25 SENSITIVE Sensitive     RIFAMPIN <=0.5 SENSITIVE Sensitive     Inducible Clindamycin NEGATIVE Sensitive     * FEW STAPHYLOCOCCUS AUREUS  MRSA PCR Screening     Status: None   Collection Time: 01/06/21 10:30 PM   Specimen: Nasopharyngeal  Result Value Ref Range Status   MRSA by PCR NEGATIVE NEGATIVE Final    Comment:        The GeneXpert MRSA Assay (FDA approved for NASAL specimens only), is one component of a comprehensive MRSA colonization surveillance program. It is not intended to diagnose MRSA infection nor to guide or monitor treatment for MRSA infections. Performed at [facility], [address] [place], [place] [address]   Aerobic/Anaerobic Culture (surgical/deep wound)     Status: None (Preliminary result)   Collection Time: 01/07/21  9:31 AM   Specimen: PATH Other; Tissue  Result Value Ref Range Status  Specimen Description   Final    ABSCESS LEFT BIG TOE Performed at [facility], [address] [place], [place] [address]    Special Requests   Final    NONE Performed at [facility], [address] [place], [place] [address]    Gram Stain   Final    RARE WBC PRESENT, PREDOMINANTLY PMN RARE GRAM POSITIVE COCCI IN CLUSTERS Performed at [facility], [address] [place], [place] [address]    Culture   Final     FEW STAPHYLOCOCCUS AUREUS NO ANAEROBES ISOLATED; CULTURE IN PROGRESS FOR 5 DAYS    Report Status PENDING  Incomplete   Organism ID, Bacteria STAPHYLOCOCCUS AUREUS  Final      Susceptibility   Staphylococcus aureus - MIC*    CIPROFLOXACIN <=0.5 SENSITIVE Sensitive     ERYTHROMYCIN <=0.25 SENSITIVE Sensitive     GENTAMICIN <=0.5 SENSITIVE Sensitive     OXACILLIN <=0.25 SENSITIVE Sensitive     TETRACYCLINE <=1 SENSITIVE Sensitive     VANCOMYCIN <=0.5 SENSITIVE Sensitive     TRIMETH/SULFA <=10 SENSITIVE Sensitive     CLINDAMYCIN <=0.25 SENSITIVE Sensitive     RIFAMPIN <=0.5 SENSITIVE Sensitive     Inducible Clindamycin NEGATIVE Sensitive     * FEW STAPHYLOCOCCUS AUREUS  Aerobic/Anaerobic Culture (surgical/deep wound)     Status: None (Preliminary result)   Collection Time: 01/07/21  9:41 AM   Specimen: PATH Bone biopsy; Tissue  Result Value Ref Range Status   Specimen Description   Final    BONE BONE CULTURE L BIG TOE Performed at [facility], [address], [place], [place] [address]    Special Requests   Final    NONE Performed at [facility], [address] [place], [place] [address]    Gram Stain   Final    FEW WBC PRESENT,BOTH PMN AND MONONUCLEAR FEW GRAM POSITIVE COCCI IN PAIRS Performed at [facility], [address], [place], [place] [address]    Culture   Final    MODERATE STAPHYLOCOCCUS AUREUS NO ANAEROBES ISOLATED; CULTURE IN PROGRESS FOR 5 DAYS    Report Status PENDING  Incomplete   Organism ID, Bacteria STAPHYLOCOCCUS AUREUS  Final      Susceptibility   Staphylococcus aureus - MIC*    CIPROFLOXACIN <=0.5 SENSITIVE Sensitive     ERYTHROMYCIN <=0.25 SENSITIVE Sensitive     GENTAMICIN <=0.5 SENSITIVE Sensitive     OXACILLIN <=0.25 SENSITIVE Sensitive     TETRACYCLINE <=1 SENSITIVE Sensitive     VANCOMYCIN 1 SENSITIVE Sensitive     TRIMETH/SULFA <=10 SENSITIVE Sensitive     CLINDAMYCIN <=0.25 SENSITIVE Sensitive     RIFAMPIN <=0.5  SENSITIVE Sensitive     Inducible Clindamycin NEGATIVE Sensitive     * MODERATE STAPHYLOCOCCUS AUREUS     Labs: BNP (last 3 results) No results for input(s): BNP in the last 8760 hours. Basic Metabolic Panel: Recent Labs  Lab 01/06/21 1416 01/07/21 0447 01/08/21 0557  NA 135 136  --   K 3.8 3.7  --   CL 99 102  --   CO2 24 25  --   GLUCOSE 77 123*  --   BUN 27* 14  --   CREATININE 1.02 0.63 0.66  CALCIUM 9.3 8.6*  --    Liver Function Tests: Recent Labs  Lab 01/06/21 1416  AST 23  ALT 19  ALKPHOS 49  BILITOT 0.9  PROT 7.7  ALBUMIN 3.8  No results for input(s): LIPASE, AMYLASE in the last 168 hours. No results for input(s): AMMONIA in the last 168 hours. CBC: Recent Labs  Lab 01/06/21 1416 01/07/21 0447 01/08/21 0557  WBC 15.9* 11.4* 14.1*  NEUTROABS 13.4*  --  12.1*  HGB 11.8* 10.7* 10.8*  HCT 33.9* 31.0* 31.3*  MCV 92.1 91.7 92.1  PLT 219 180 199   Cardiac Enzymes: No results for input(s): CKTOTAL, CKMB, CKMBINDEX, TROPONINI in the last 168 hours. BNP: Invalid input(s): POCBNP CBG: Recent Labs  Lab 01/08/21 1135 01/08/21 1704 01/08/21 2155 01/09/21 0755 01/09/21 1129  GLUCAP 256* 107* 265* 131* 166*   D-Dimer No results for input(s): DDIMER in the last 72 hours. Hgb A1c Recent Labs    01/06/21 1415  HGBA1C 6.1*   Lipid Profile No results for input(s): CHOL, HDL, LDLCALC, TRIG, CHOLHDL, LDLDIRECT in the last 72 hours. Thyroid function studies No results for input(s): TSH, T4TOTAL, T3FREE, THYROIDAB in the last 72 hours.  Invalid input(s): FREET3 Anemia work up No results for input(s): VITAMINB12, FOLATE, FERRITIN, TIBC, IRON, RETICCTPCT in the last 72 hours. Urinalysis    Component Value Date/Time   COLORURINE YELLOW (A) 01/06/2021 2230   APPEARANCEUR CLEAR (A) 01/06/2021 2230   APPEARANCEUR Clear 10/20/2020 1334   LABSPEC 1.021 01/06/2021 2230   PHURINE 5.0 01/06/2021 2230   GLUCOSEU NEGATIVE 01/06/2021 2230   HGBUR SMALL (A)  01/06/2021 2230   BILIRUBINUR NEGATIVE 01/06/2021 2230   BILIRUBINUR Negative 10/20/2020 1334   KETONESUR 20 (A) 01/06/2021 2230   PROTEINUR 100 (A) 01/06/2021 2230   NITRITE NEGATIVE 01/06/2021 2230   LEUKOCYTESUR NEGATIVE 01/06/2021 2230   Sepsis Labs Invalid input(s): PROCALCITONIN,  WBC,  LACTICIDVEN Microbiology Recent Results (from the past 240 hour(s))  Blood culture (routine x 2)     Status: None (Preliminary result)   Collection Time: 01/06/21  2:22 PM   Specimen: BLOOD  Result Value Ref Range Status   Specimen Description BLOOD LEFT ANTECUBITAL  Final   Special Requests   Final    BOTTLES DRAWN AEROBIC AND ANAEROBIC Blood Culture adequate volume   Culture   Final    NO GROWTH 3 DAYS Performed at [facility], [address] [place], [place] [address]    Report Status PENDING  Incomplete  Blood culture (routine x 2)     Status: None (Preliminary result)   Collection Time: 01/06/21  2:22 PM   Specimen: BLOOD  Result Value Ref Range Status   Specimen Description BLOOD BLOOD RIGHT WRIST  Final   Special Requests   Final    BOTTLES DRAWN AEROBIC AND ANAEROBIC Blood Culture adequate volume   Culture   Final    NO GROWTH 3 DAYS Performed at [facility], [address], [place], [place] [address]    Report Status PENDING  Incomplete  SARS Coronavirus 2 by RT PCR (hospital order, performed in [facility] hospital lab) Nasopharyngeal Nasopharyngeal Swab     Status: None   Collection Time: 01/06/21  3:26 PM   Specimen: Nasopharyngeal Swab  Result Value Ref Range Status   SARS Coronavirus 2 NEGATIVE NEGATIVE Final    Comment: (NOTE) SARS-CoV-2 target nucleic acids are NOT DETECTED.  The SARS-CoV-2 RNA is generally detectable in upper and lower respiratory specimens during the acute phase of infection. The lowest concentration of SARS-CoV-2 viral copies this assay can detect is 250 copies / mL. A negative result does not preclude SARS-CoV-2  infection and should not be used as the  sole basis for treatment or other patient management decisions.  A negative result may occur with improper specimen collection / handling, submission of specimen other than nasopharyngeal swab, presence of viral mutation(s) within the areas targeted by this assay, and inadequate number of viral copies (<250 copies / mL). A negative result must be combined with clinical observations, patient history, and epidemiological information.  Fact Sheet for Patients:   BoilerBrush.com.cy  Fact Sheet for Healthcare Providers: https://pope.com/  This test is not yet approved or  cleared by the [place] FDA and has been authorized for detection and/or diagnosis of SARS-CoV-2 by FDA under an Emergency Use Authorization (EUA).  This EUA will remain in effect (meaning this test can be used) for the duration of the COVID-19 declaration under Section 564(b)(1) of the Act, 21 U.S.C. section 360bbb-3(b)(1), unless the authorization is terminated or revoked sooner.  Performed at [facility], [address] [place], [place] [address]   Wound or Superficial Culture     Status: None   Collection Time: 01/06/21  3:52 PM   Specimen: Toe; Wound  Result Value Ref Range Status   Specimen Description   Final    TOE LEFT Performed at [facility], [address], [place], [place] [address]    Special Requests   Final    NONE Performed at [facility], [address] [place], [place] [address]    Gram Stain   Final    FEW WBC PRESENT, PREDOMINANTLY PMN MODERATE GRAM POSITIVE COCCI Performed at [facility], [address] [place], [place] [address]    Culture ABUNDANT STAPHYLOCOCCUS AUREUS  Final   Report Status 01/09/2021 FINAL  Final   Organism ID, Bacteria STAPHYLOCOCCUS AUREUS  Final      Susceptibility   Staphylococcus aureus - MIC*    CIPROFLOXACIN <=0.5 SENSITIVE  Sensitive     ERYTHROMYCIN <=0.25 SENSITIVE Sensitive     GENTAMICIN <=0.5 SENSITIVE Sensitive     OXACILLIN <=0.25 SENSITIVE Sensitive     TETRACYCLINE <=1 SENSITIVE Sensitive     VANCOMYCIN 1 SENSITIVE Sensitive     TRIMETH/SULFA <=10 SENSITIVE Sensitive     CLINDAMYCIN <=0.25 SENSITIVE Sensitive     RIFAMPIN <=0.5 SENSITIVE Sensitive     Inducible Clindamycin NEGATIVE Sensitive     * ABUNDANT STAPHYLOCOCCUS AUREUS  Aerobic/Anaerobic Culture (surgical/deep wound)     Status: None (Preliminary result)   Collection Time: 01/06/21  3:52 PM   Specimen: Toe; Wound  Result Value Ref Range Status   Specimen Description   Final    TOE LEFT Performed at [facility], [address], [place], [place] [address]    Special Requests   Final    NONE Performed at [facility], [address], [place], [place] [address]    Gram Stain   Final    NO WBC SEEN FEW GRAM POSITIVE COCCI Performed at [facility], [address] [place], [place] [address]    Culture   Final    FEW STAPHYLOCOCCUS AUREUS NO ANAEROBES ISOLATED; CULTURE IN PROGRESS FOR 5 DAYS    Report Status PENDING  Incomplete   Organism ID, Bacteria STAPHYLOCOCCUS AUREUS  Final      Susceptibility   Staphylococcus aureus - MIC*    CIPROFLOXACIN <=0.5 SENSITIVE Sensitive     ERYTHROMYCIN <=0.25 SENSITIVE Sensitive     GENTAMICIN <=0.5 SENSITIVE Sensitive     OXACILLIN 0.5 SENSITIVE Sensitive     TETRACYCLINE <=1 SENSITIVE  Sensitive     VANCOMYCIN <=0.5 SENSITIVE Sensitive     TRIMETH/SULFA <=10 SENSITIVE Sensitive     CLINDAMYCIN <=0.25 SENSITIVE Sensitive     RIFAMPIN <=0.5 SENSITIVE Sensitive     Inducible Clindamycin NEGATIVE Sensitive     * FEW STAPHYLOCOCCUS AUREUS  MRSA PCR Screening     Status: None   Collection Time: 01/06/21 10:30 PM   Specimen: Nasopharyngeal  Result Value Ref Range Status   MRSA by PCR NEGATIVE NEGATIVE Final    Comment:        The GeneXpert MRSA Assay (FDA approved  for NASAL specimens only), is one component of a comprehensive MRSA colonization surveillance program. It is not intended to diagnose MRSA infection nor to guide or monitor treatment for MRSA infections. Performed at [facility], [address] [place], [place] [address]   Aerobic/Anaerobic Culture (surgical/deep wound)     Status: None (Preliminary result)   Collection Time: 01/07/21  9:31 AM   Specimen: PATH Other; Tissue  Result Value Ref Range Status   Specimen Description   Final    ABSCESS LEFT BIG TOE Performed at [facility], [address] [place], [place] [address]    Special Requests   Final    NONE Performed at [facility], [address] [place], [place] [address]    Gram Stain   Final    RARE WBC PRESENT, PREDOMINANTLY PMN RARE GRAM POSITIVE COCCI IN CLUSTERS Performed at [facility], [address] [place], [place] [address]    Culture   Final    FEW STAPHYLOCOCCUS AUREUS NO ANAEROBES ISOLATED; CULTURE IN PROGRESS FOR 5 DAYS    Report Status PENDING  Incomplete   Organism ID, Bacteria STAPHYLOCOCCUS AUREUS  Final      Susceptibility   Staphylococcus aureus - MIC*    CIPROFLOXACIN <=0.5 SENSITIVE Sensitive     ERYTHROMYCIN <=0.25 SENSITIVE Sensitive     GENTAMICIN <=0.5 SENSITIVE Sensitive     OXACILLIN <=0.25 SENSITIVE Sensitive     TETRACYCLINE <=1 SENSITIVE Sensitive     VANCOMYCIN <=0.5 SENSITIVE Sensitive     TRIMETH/SULFA <=10 SENSITIVE Sensitive     CLINDAMYCIN <=0.25 SENSITIVE Sensitive     RIFAMPIN <=0.5 SENSITIVE Sensitive     Inducible Clindamycin NEGATIVE Sensitive     * FEW STAPHYLOCOCCUS AUREUS  Aerobic/Anaerobic Culture (surgical/deep wound)     Status: None (Preliminary result)   Collection Time: 01/07/21  9:41 AM   Specimen: PATH Bone biopsy; Tissue  Result Value Ref Range Status   Specimen Description   Final    BONE BONE CULTURE L BIG TOE Performed at [facility], [address] [place], [place] [address]    Special Requests   Final    NONE Performed at [facility], [address] [place], [place] [address]    Gram Stain   Final    FEW WBC PRESENT,BOTH PMN AND MONONUCLEAR FEW GRAM POSITIVE COCCI IN PAIRS Performed at [facility], [address] [place], [place] [address]    Culture   Final    MODERATE STAPHYLOCOCCUS AUREUS NO ANAEROBES ISOLATED; CULTURE IN PROGRESS FOR 5 DAYS    Report Status PENDING  Incomplete   Organism ID, Bacteria STAPHYLOCOCCUS AUREUS  Final      Susceptibility   Staphylococcus aureus - MIC*    CIPROFLOXACIN <=0.5 SENSITIVE Sensitive     ERYTHROMYCIN <=0.25 SENSITIVE Sensitive     GENTAMICIN <=0.5 SENSITIVE Sensitive  OXACILLIN <=0.25 SENSITIVE Sensitive     TETRACYCLINE <=1 SENSITIVE Sensitive     VANCOMYCIN 1 SENSITIVE Sensitive     TRIMETH/SULFA <=10 SENSITIVE Sensitive     CLINDAMYCIN <=0.25 SENSITIVE Sensitive     RIFAMPIN <=0.5 SENSITIVE Sensitive     Inducible Clindamycin NEGATIVE Sensitive     * MODERATE STAPHYLOCOCCUS AUREUS    Time coordinating discharge: Over 30 minutes  SIGNED:  Arnetha Courser, MD  [facility] 01/09/2021, 12:43 PM  If 7PM-7AM, please contact night-coverage [email or web address]  This record has been created using Conservation officer, historic buildings. Errors have been sought and corrected,but may not always be located. Such creation errors do not reflect on the standard of care.

## 2021-01-09 NOTE — Plan of Care (Signed)

## 2021-01-09 NOTE — Progress Notes (Signed)
PODIATRY / FOOT AND ANKLE SURGERY PROGRESS NOTE  Requesting Physician: Londell Moh, DO  Reason for consult: L foot wound infection  Chief Complaint: L foot infection   HPI: [name] is a 60 y.o. male who presents for follow-up status post 2 days left partial first ray and second toe amputation.  Patient is kept surgical dressings clean, dry, and intact since procedure.  Patient has been weightbearing minimally in a surgical shoe with heel contact.  Patient has minimal pain to the area of the procedure site.  Patient is likely to be discharged today.  PMHx:  Past Medical History:  Diagnosis Date  . Arthritis    hands  . COVID-19 12/29/2019  . Diabetes mellitus without complication (HCC)    type 2  . GERD (gastroesophageal reflux disease)   . Hypercholesterolemia   . Osteomyelitis of left foot [facility])     Surgical Hx:  Past Surgical History:  Procedure Laterality Date  . ADENOIDECTOMY    . AMPUTATION Right 10/07/2020   Procedure: AMPUTATION RAY-Right Fifth Ray;  Surgeon: Rosetta Posner, DPM;  Location: [facility] ORS;  Service: Podiatry;  Laterality: Right;  . AMPUTATION Left 01/07/2021   Procedure: AMPUTATION RAY - Left 1st and 2nd Ray;  Surgeon: Rosetta Posner, DPM;  Location: [facility] ORS;  Service: Podiatry;  Laterality: Left;  . CATARACT EXTRACTION W/PHACO Right 03/03/2020   Procedure: CATARACT EXTRACTION PHACO AND INTRAOCULAR LENS PLACEMENT (IOC) RIGHT DIABETIC VISION BLUE;  Surgeon: Elliot Cousin, MD;  Location: [facility];  Service: Ophthalmology;  Laterality: Right;  COVID ( + ) 12-29-2019  30.97 02:27.7  . CATARACT EXTRACTION W/PHACO Left 06/23/2020   Procedure: CATARACT EXTRACTION PHACO AND INTRAOCULAR LENS PLACEMENT (IOC) LEFT DIABETIC VISION BLUE 5.14  00:34.7;  Surgeon: Elliot Cousin, MD;  Location: [facility];  Service: Ophthalmology;  Laterality: Left;  DIABETIC  . ELBOW FRACTURE SURGERY Right   . EXTERNAL EAR SURGERY    . IRRIGATION AND DEBRIDEMENT FOOT Right  10/07/2020   Procedure: IRRIGATION AND DEBRIDEMENT FOOT;  Surgeon: Rosetta Posner, DPM;  Location: [facility] OR[facility];  Service: Podiatry;  Laterality: Right;    FHx:  Family History  Problem Relation Age of Onset  . Dementia Mother   . Hypertension Mother   . Diabetes Maternal Grandmother   . Hypertension Maternal Grandmother   . Cancer Neg Hx   . COPD Neg Hx   . Heart disease Neg Hx   . Stroke Neg Hx     Social History:  reports that he has never smoked. He has never used smokeless tobacco. He reports previous alcohol use. He reports that he does not use drugs.  Allergies: No Known Allergies  Review of Systems: General ROS: negative Respiratory ROS: no cough, shortness of breath, or wheezing Cardiovascular ROS: no chest pain or dyspnea on exertion Gastrointestinal ROS: no abdominal pain, change in bowel habits, or black or bloody stools Musculoskeletal ROS: positive for - joint pain and joint swelling Neurological ROS: positive for - numbness/tingling Dermatological ROS: positive for Left partial first ray and second toe amputation incision line  Medications Prior to Admission  Medication Sig Dispense Refill  . Ascorbic Acid (VITAMIN C CR) 1500 MG TBCR Take 1,500 mg by mouth daily.    Marland Kitchen aspirin EC 325 MG tablet Take 325 mg by mouth daily.    Marland Kitchen atorvastatin (LIPITOR) 20 MG tablet Take 1 tablet (20 mg total) by mouth every other day. 90 tablet 1  . Cinnamon 500 MG capsule Take 500 mg by  mouth daily.    . Garlic 1000 MG CAPS Take 1,000 mg by mouth daily.    Marland Kitchen glipiZIDE (GLUCOTROL) 5 MG tablet Take 1 tablet (5 mg total) by mouth 2 (two) times daily before a meal. 180 tablet 1  . insulin degludec (TRESIBA FLEXTOUCH) 100 UNIT/ML FlexTouch Pen Inject 18 Units into the skin daily. 15 mL 2  . metFORMIN (GLUCOPHAGE) 1000 MG tablet Take 1 tablet (1,000 mg total) by mouth 2 (two) times daily with a meal. 180 tablet 1    Physical Exam: General: Alert and oriented.  No apparent  distress. Vascular: DP/PT pulses palpable bilateral.  Capillary fill time intact digits bilateral.  Mild erythema and edema present to the left forefoot, improved since preoperative state.  Neuro: Light touch sensation diminished to bilateral lower extremities.  Derm: Left partial first ray and second toe amputation sites appear to have skin edges well coapted with sutures intact, mild periwound erythema and edema consistent with postoperative course, no drainage noted, no purulence, no odor, appears to be very stable.  MSK: Right partial fifth ray amputation.  Left partial first ray and second toe amputation.  Hammertoe contractures noted to remaining digits both feet.  Results for orders placed or performed during the hospital encounter of 01/06/21 (from the past 48 hour(s))  Glucose, capillary     Status: Abnormal   Collection Time: 01/07/21  4:29 PM  Result Value Ref Range   Glucose-Capillary 316 (H) 70 - 99 mg/dL    Comment: Glucose reference range applies only to samples taken after fasting for at least 8 hours.   Comment 1 Notify RN    Comment 2 Document in Chart   Glucose, capillary     Status: Abnormal   Collection Time: 01/07/21  8:47 PM  Result Value Ref Range   Glucose-Capillary 335 (H) 70 - 99 mg/dL    Comment: Glucose reference range applies only to samples taken after fasting for at least 8 hours.   Comment 1 Notify RN   CBC with Differential/Platelet     Status: Abnormal   Collection Time: 01/08/21  5:57 AM  Result Value Ref Range   WBC 14.1 (H) 4.0 - 10.5 K/uL   RBC 3.40 (L) 4.22 - 5.81 MIL/uL   Hemoglobin 10.8 (L) 13.0 - 17.0 g/dL   HCT 58.5 (L) 27.7 - 82.4 %   MCV 92.1 80.0 - 100.0 fL   MCH 31.8 26.0 - 34.0 pg   MCHC 34.5 30.0 - 36.0 g/dL   RDW 23.5 36.1 - 44.3 %   Platelets 199 150 - 400 K/uL   nRBC 0.0 0.0 - 0.2 %   Neutrophils Relative % 85 %   Neutro Abs 12.1 (H) 1.7 - 7.7 K/uL   Lymphocytes Relative 9 %   Lymphs Abs 1.3 0.7 - 4.0 K/uL   Monocytes  Relative 5 %   Monocytes Absolute 0.6 0.1 - 1.0 K/uL   Eosinophils Relative 0 %   Eosinophils Absolute 0.0 0.0 - 0.5 K/uL   Basophils Relative 0 %   Basophils Absolute 0.0 0.0 - 0.1 K/uL   Immature Granulocytes 1 %   Abs Immature Granulocytes 0.09 (H) 0.00 - 0.07 K/uL    Comment: Performed at [facility], [address] [place], [place] [address]  Creatinine, serum     Status: None   Collection Time: 01/08/21  5:57 AM  Result Value Ref Range   Creatinine, Ser 0.66 0.61 - 1.24 mg/dL   GFR, Estimated >86 >76  mL/min    Comment: (NOTE) Calculated using the CKD-EPI Creatinine Equation (2021) Performed at [facility], [address] [place], [place] [address]   Glucose, capillary     Status: Abnormal   Collection Time: 01/08/21  7:29 AM  Result Value Ref Range   Glucose-Capillary 172 (H) 70 - 99 mg/dL    Comment: Glucose reference range applies only to samples taken after fasting for at least 8 hours.  Glucose, capillary     Status: Abnormal   Collection Time: 01/08/21 11:35 AM  Result Value Ref Range   Glucose-Capillary 256 (H) 70 - 99 mg/dL    Comment: Glucose reference range applies only to samples taken after fasting for at least 8 hours.  Glucose, capillary     Status: Abnormal   Collection Time: 01/08/21  5:04 PM  Result Value Ref Range   Glucose-Capillary 107 (H) 70 - 99 mg/dL    Comment: Glucose reference range applies only to samples taken after fasting for at least 8 hours.  Glucose, capillary     Status: Abnormal   Collection Time: 01/08/21  9:55 PM  Result Value Ref Range   Glucose-Capillary 265 (H) 70 - 99 mg/dL    Comment: Glucose reference range applies only to samples taken after fasting for at least 8 hours.  Glucose, capillary     Status: Abnormal   Collection Time: 01/09/21  7:55 AM  Result Value Ref Range   Glucose-Capillary 131 (H) 70 - 99 mg/dL    Comment: Glucose reference range applies only to samples taken after fasting for at  least 8 hours.  Glucose, capillary     Status: Abnormal   Collection Time: 01/09/21 11:29 AM  Result Value Ref Range   Glucose-Capillary 166 (H) 70 - 99 mg/dL    Comment: Glucose reference range applies only to samples taken after fasting for at least 8 hours.   No results found.  Blood pressure (!) 162/92, pulse 84, temperature 97.9 F (36.6 C), resp. rate 17, height 6' (1.829 m), weight 106 kg, SpO2 97 %.  Assessment 1. Acute osteomyelitis to the left hallux with associated ulceration and abscess with concern for septic joint to the IPJ hallux and first metatarsal phalangeal joint status post left partial first ray amputation and second toe amputation. 2. Cellulitis left foot-resolved 3. Chronic osteomyelitis 4. DM2 with polyneuropathy  Plan -Pt seen and examined. -Postoperative imaging reviewed. -Incision lines left partial first ray amputation site and second toe appeared to be intact and stable with no openings or signs of dehiscence, sutures intact, skin edges well coapted, no acute active signs of infection present. -Intraoperative culture grew MSSA.  Patient should be discharged on Keflex for 7-day course. -Patient to be partial weightbearing with heel contact in a surgical shoe.  Patient selected on how to do this and has worked with physical therapy for this. -Patient keep surgical dressings clean, dry, and intact that were applied today consisting of Xeroform to the incision line followed by 4 x 4 gauze, ABD, Kerlix, Ace wrap. -Patient should try to stay off of the left foot is much as possible to ensure proper incision healing.  Encourage patient on continued blood glucose control. -Patient to follow-up with vascular as outpatient. -Patient to follow-up in clinic within 1 week of discharge date.  Podiatry team to sign off at this time.  Discharge instructions placed in chart.  Rosetta Posner, DPM 01/09/2021, 12:44 PM

## 2021-01-11 LAB — SURGICAL PATHOLOGY

## 2021-01-11 LAB — CULTURE, BLOOD (ROUTINE X 2)
Culture: NO GROWTH
Culture: NO GROWTH
Special Requests: ADEQUATE
Special Requests: ADEQUATE

## 2021-01-12 LAB — AEROBIC/ANAEROBIC CULTURE W GRAM STAIN (SURGICAL/DEEP WOUND): Gram Stain: NONE SEEN

## 2021-01-18 DIAGNOSIS — Z09 Encounter for follow-up examination after completed treatment for conditions other than malignant neoplasm: Secondary | ICD-10-CM | POA: Diagnosis not present

## 2021-01-18 DIAGNOSIS — Z89422 Acquired absence of other left toe(s): Secondary | ICD-10-CM | POA: Diagnosis not present

## 2021-01-25 DIAGNOSIS — L97522 Non-pressure chronic ulcer of other part of left foot with fat layer exposed: Secondary | ICD-10-CM | POA: Diagnosis not present

## 2021-01-25 DIAGNOSIS — E1142 Type 2 diabetes mellitus with diabetic polyneuropathy: Secondary | ICD-10-CM | POA: Diagnosis not present

## 2021-02-01 DIAGNOSIS — I739 Peripheral vascular disease, unspecified: Secondary | ICD-10-CM | POA: Diagnosis not present

## 2021-02-01 DIAGNOSIS — Z89422 Acquired absence of other left toe(s): Secondary | ICD-10-CM | POA: Diagnosis not present

## 2021-02-01 DIAGNOSIS — E1142 Type 2 diabetes mellitus with diabetic polyneuropathy: Secondary | ICD-10-CM | POA: Diagnosis not present

## 2021-02-01 DIAGNOSIS — L97522 Non-pressure chronic ulcer of other part of left foot with fat layer exposed: Secondary | ICD-10-CM | POA: Diagnosis not present

## 2021-02-08 DIAGNOSIS — L97512 Non-pressure chronic ulcer of other part of right foot with fat layer exposed: Secondary | ICD-10-CM | POA: Diagnosis not present

## 2021-02-08 DIAGNOSIS — Z89422 Acquired absence of other left toe(s): Secondary | ICD-10-CM | POA: Diagnosis not present

## 2021-02-08 DIAGNOSIS — L97522 Non-pressure chronic ulcer of other part of left foot with fat layer exposed: Secondary | ICD-10-CM | POA: Diagnosis not present

## 2021-02-08 DIAGNOSIS — E1142 Type 2 diabetes mellitus with diabetic polyneuropathy: Secondary | ICD-10-CM | POA: Diagnosis not present

## 2021-02-08 DIAGNOSIS — Z09 Encounter for follow-up examination after completed treatment for conditions other than malignant neoplasm: Secondary | ICD-10-CM | POA: Diagnosis not present

## 2021-02-12 ENCOUNTER — Other Ambulatory Visit: Payer: Self-pay | Admitting: Family Medicine

## 2021-02-14 ENCOUNTER — Telehealth: Payer: Self-pay

## 2021-02-14 NOTE — Telephone Encounter (Signed)
Copied from CRM [id number]. Topic: Appointment Scheduling - Scheduling Inquiry for Clinic >> Feb 10, 2021  2:24 PM Pawlus, [name] wrote: Reason for CRM: Pt stated he was supposed to receive [name] call to set up [name] new patient appt but has not heard anything yet. Please call back and schedule.   Pt was seen on 12/21 by provider pcp was rachel  Will now see [name]. Is not [name] new pt if calls back schedule him for apt with any other provider as he has bcb

## 2021-02-17 MED ORDER — ACCU-CHEK GUIDE VI STRP: 1.0000 | ORAL_STRIP | Freq: Every day | 1 refills | Status: DC

## 2021-02-17 NOTE — Addendum Note (Signed)
Addended by: Pablo Ledger on: 02/17/2021 04:14 PM   Modules accepted: Orders

## 2021-02-17 NOTE — Telephone Encounter (Signed)
Pt called to check on denial of test strip Rx/ Pt has scheduled an appt for next Wednesday /please advise

## 2021-02-17 NOTE — Telephone Encounter (Signed)
Please advise pt scheduled 3/16 with Jolene due to having bcbs

## 2021-02-17 NOTE — Telephone Encounter (Signed)
Can we look up and see what his insurance takes and what meter he has?  He may need a new meter.

## 2021-02-17 NOTE — Telephone Encounter (Signed)
Called patient. He states he has a Orthoptist Meter and just needs strips sent in. RX started.

## 2021-02-18 ENCOUNTER — Encounter: Payer: Self-pay | Admitting: Nurse Practitioner

## 2021-02-18 DIAGNOSIS — Z89421 Acquired absence of other right toe(s): Secondary | ICD-10-CM | POA: Insufficient documentation

## 2021-02-22 ENCOUNTER — Other Ambulatory Visit: Payer: Self-pay

## 2021-02-22 ENCOUNTER — Encounter: Payer: Self-pay | Admitting: Nurse Practitioner

## 2021-02-22 ENCOUNTER — Ambulatory Visit: Payer: BC Managed Care – PPO | Admitting: Nurse Practitioner

## 2021-02-22 VITALS — BP 125/77 | HR 93 | Temp 98.1°F | Wt 236.6 lb

## 2021-02-22 DIAGNOSIS — E66811 Obesity, class 1: Secondary | ICD-10-CM

## 2021-02-22 DIAGNOSIS — I739 Peripheral vascular disease, unspecified: Secondary | ICD-10-CM

## 2021-02-22 DIAGNOSIS — Z6832 Body mass index (BMI) 32.0-32.9, adult: Secondary | ICD-10-CM

## 2021-02-22 DIAGNOSIS — Z89422 Acquired absence of other left toe(s): Secondary | ICD-10-CM

## 2021-02-22 DIAGNOSIS — Z794 Long term (current) use of insulin: Secondary | ICD-10-CM

## 2021-02-22 DIAGNOSIS — E1169 Type 2 diabetes mellitus with other specified complication: Secondary | ICD-10-CM | POA: Diagnosis not present

## 2021-02-22 DIAGNOSIS — E785 Hyperlipidemia, unspecified: Secondary | ICD-10-CM

## 2021-02-22 DIAGNOSIS — E6609 Other obesity due to excess calories: Secondary | ICD-10-CM

## 2021-02-22 DIAGNOSIS — E538 Deficiency of other specified B group vitamins: Secondary | ICD-10-CM | POA: Diagnosis not present

## 2021-02-22 DIAGNOSIS — E1140 Type 2 diabetes mellitus with diabetic neuropathy, unspecified: Secondary | ICD-10-CM | POA: Diagnosis not present

## 2021-02-22 LAB — MICROALBUMIN, URINE WAIVED
Creatinine, Urine Waived: 300 mg/dL (ref 10–300)
Microalb, Ur Waived: 150 mg/L — ABNORMAL HIGH (ref 0–19)

## 2021-02-22 MED ORDER — TRESIBA FLEXTOUCH 100 UNIT/ML ~~LOC~~ SOPN: 18.0000 [IU] | PEN_INJECTOR | Freq: Every day | SUBCUTANEOUS | 4 refills | Status: DC

## 2021-02-22 MED ORDER — GLIPIZIDE 5 MG PO TABS: 5.0000 mg | ORAL_TABLET | Freq: Two times a day (BID) | ORAL | 4 refills | Status: DC

## 2021-02-22 MED ORDER — METFORMIN HCL 1000 MG PO TABS: 1000.0000 mg | ORAL_TABLET | Freq: Two times a day (BID) | ORAL | 4 refills | Status: DC

## 2021-02-22 MED ORDER — ATORVASTATIN CALCIUM 20 MG PO TABS: 20.0000 mg | ORAL_TABLET | ORAL | 4 refills | Status: DC

## 2021-02-22 NOTE — Assessment & Plan Note (Signed)
BMI 32.09.  Recommended eating smaller high protein, low fat meals more frequently and exercising 30 mins a day 5 times a week with a goal of 10-15lb weight loss in the next 3 months. Patient voiced their understanding and motivation to adhere to these recommendations.  

## 2021-02-22 NOTE — Assessment & Plan Note (Addendum)
Chronic, ongoing with recent wound, followed by podiatry at this time.  Will keep BP and cholesterol + sugars under good control. Continue aspirin. Consider vascular referral in future.  CBC and TSH today.  Return in 3 months.

## 2021-02-22 NOTE — Assessment & Plan Note (Signed)
Chronic, ongoing with recent A1c 6.1%.  Urine ALB 150 today, has been drinking protein shakes TID.  Discussed with him that will recheck next visit and if ongoing elevations highly recommend starting low dose ACE or ARB -- educated him on these medications and kidney benefit.  At this time continue current medication regimen and adjust as needed.  Check BS 2-3 times daily and document for visits.  Return in 3 months.

## 2021-02-22 NOTE — Patient Instructions (Signed)
Diabetes Mellitus and Nutrition, Adult When you have diabetes, or diabetes mellitus, it is very important to have healthy eating habits because your blood sugar (glucose) levels are greatly affected by what you eat and drink. Eating healthy foods in the right amounts, at about the same times every day, can help you:  Control your blood glucose.  Lower your risk of heart disease.  Improve your blood pressure.  Reach or maintain a healthy weight. What can affect my meal plan? Every person with diabetes is different, and each person has different needs for a meal plan. Your health care provider may recommend that you work with a dietitian to make a meal plan that is best for you. Your meal plan may vary depending on factors such as:  The calories you need.  The medicines you take.  Your weight.  Your blood glucose, blood pressure, and cholesterol levels.  Your activity level.  Other health conditions you have, such as heart or kidney disease. How do carbohydrates affect me? Carbohydrates, also called carbs, affect your blood glucose level more than any other type of food. Eating carbs naturally raises the amount of glucose in your blood. Carb counting is a method for keeping track of how many carbs you eat. Counting carbs is important to keep your blood glucose at a healthy level, especially if you use insulin or take certain oral diabetes medicines. It is important to know how many carbs you can safely have in each meal. This is different for every person. Your dietitian can help you calculate how many carbs you should have at each meal and for each snack. How does alcohol affect me? Alcohol can cause a sudden decrease in blood glucose (hypoglycemia), especially if you use insulin or take certain oral diabetes medicines. Hypoglycemia can be a life-threatening condition. Symptoms of hypoglycemia, such as sleepiness, dizziness, and confusion, are similar to symptoms of having too much  alcohol.  Do not drink alcohol if: ? Your health care provider tells you not to drink. ? You are pregnant, may be pregnant, or are planning to become pregnant.  If you drink alcohol: ? Do not drink on an empty stomach. ? Limit how much you use to:  0-1 drink a day for women.  0-2 drinks a day for men. ? Be aware of how much alcohol is in your drink. In the [place] one drink equals one 12 oz bottle of beer (355 mL), one 5 oz glass of wine (148 mL), or one 1 oz glass of hard liquor (44 mL). ? Keep yourself hydrated with water, diet soda, or unsweetened iced tea.  Keep in mind that regular soda, juice, and other mixers may contain a lot of sugar and must be counted as carbs. What are tips for following this plan? Reading food labels  Start by checking the serving size on the "Nutrition Facts" label of packaged foods and drinks. The amount of calories, carbs, fats, and other nutrients listed on the label is based on one serving of the item. Many items contain more than one serving per package.  Check the total grams (g) of carbs in one serving. You can calculate the number of servings of carbs in one serving by dividing the total carbs by 15. For example, if a food has 30 g of total carbs per serving, it would be equal to 2 servings of carbs.  Check the number of grams (g) of saturated fats and trans fats in one serving. Choose foods that have   a low amount or none of these fats.  Check the number of milligrams (mg) of salt (sodium) in one serving. Most people should limit total sodium intake to less than 2,300 mg per day.  Always check the nutrition information of foods labeled as "low-fat" or "nonfat." These foods may be higher in added sugar or refined carbs and should be avoided.  Talk to your dietitian to identify your daily goals for nutrients listed on the label. Shopping  Avoid buying canned, pre-made, or processed foods. These foods tend to be high in fat, sodium, and added  sugar.  Shop around the outside edge of the grocery store. This is where you will most often find fresh fruits and vegetables, bulk grains, fresh meats, and fresh dairy. Cooking  Use low-heat cooking methods, such as baking, instead of high-heat cooking methods like deep frying.  Cook using healthy oils, such as olive, canola, or sunflower oil.  Avoid cooking with butter, cream, or high-fat meats. Meal planning  Eat meals and snacks regularly, preferably at the same times every day. Avoid going long periods of time without eating.  Eat foods that are high in fiber, such as fresh fruits, vegetables, beans, and whole grains. Talk with your dietitian about how many servings of carbs you can eat at each meal.  Eat 4-6 oz (112-168 g) of lean protein each day, such as lean meat, chicken, fish, eggs, or tofu. One ounce (oz) of lean protein is equal to: ? 1 oz (28 g) of meat, chicken, or fish. ? 1 egg. ?  cup (62 g) of tofu.  Eat some foods each day that contain healthy fats, such as avocado, nuts, seeds, and fish.   What foods should I eat? Fruits Berries. Apples. Oranges. Peaches. Apricots. Plums. Grapes. Mango. Papaya. Pomegranate. Kiwi. Cherries. Vegetables Lettuce. Spinach. Leafy greens, including kale, chard, collard greens, and mustard greens. Beets. Cauliflower. Cabbage. Broccoli. Carrots. Green beans. Tomatoes. Peppers. Onions. Cucumbers. Brussels sprouts. Grains Whole grains, such as whole-wheat or whole-grain bread, crackers, tortillas, cereal, and pasta. Unsweetened oatmeal. Quinoa. Brown or wild rice. Meats and other proteins Seafood. Poultry without skin. Lean cuts of poultry and beef. Tofu. Nuts. Seeds. Dairy Low-fat or fat-free dairy products such as milk, yogurt, and cheese. The items listed above may not be a complete list of foods and beverages you can eat. Contact a dietitian for more information. What foods should I avoid? Fruits Fruits canned with  syrup. Vegetables Canned vegetables. Frozen vegetables with butter or cream sauce. Grains Refined white flour and flour products such as bread, pasta, snack foods, and cereals. Avoid all processed foods. Meats and other proteins Fatty cuts of meat. Poultry with skin. Breaded or fried meats. Processed meat. Avoid saturated fats. Dairy Full-fat yogurt, cheese, or milk. Beverages Sweetened drinks, such as soda or iced tea. The items listed above may not be a complete list of foods and beverages you should avoid. Contact a dietitian for more information. Questions to ask a health care provider  Do I need to meet with a diabetes educator?  Do I need to meet with a dietitian?  What number can I call if I have questions?  When are the best times to check my blood glucose? Where to find more information:  American Diabetes Association: diabetes.org  Academy of Nutrition and Dietetics: www.eatright.org  National Institute of Diabetes and Digestive and Kidney Diseases: www.niddk.nih.gov  Association of Diabetes Care and Education Specialists: www.diabeteseducator.org Summary  It is important to have healthy eating   habits because your blood sugar (glucose) levels are greatly affected by what you eat and drink.  A healthy meal plan will help you control your blood glucose and maintain a healthy lifestyle.  Your health care provider may recommend that you work with a dietitian to make a meal plan that is best for you.  Keep in mind that carbohydrates (carbs) and alcohol have immediate effects on your blood glucose levels. It is important to count carbs and to use alcohol carefully. This information is not intended to replace advice given to you by your health care provider. Make sure you discuss any questions you have with your health care provider. Document Revised: 11/03/2019 Document Reviewed: 11/03/2019 [employer] Patient Education  2021 [employer]  

## 2021-02-22 NOTE — Progress Notes (Signed)
BP 125/77   Pulse 93   Temp 98.1 F (36.7 C) (Oral)   Wt 236 lb 9.6 oz (107.3 kg)   SpO2 97%   BMI 32.09 kg/m    Subjective:    Patient ID: [name], male    DOB: 04/24/1961, 60 y.o.   MRN: [id number]  HPI: [name] is a 59 y.o. male  Chief Complaint  Patient presents with  . medication follow up    Patient is here to follow up on his medication. Patient states he is here to have his A1c checked at well at this visit. Patient states he is out of his testing strips. Patient states he was supposed to be seen back in Feb, but was put back into the hospital.    Served in the [employer] for 8 years, was overseas in [place].  No exposures he was aware of.  Worked on radios.  Not currently service connected at [facility], discussed this with him and the benefit it would offer.  DIABETES Continues on Metformin 1000 MG BID, Glipizide 5 MG BID, and Tresiba 18 units daily -- not always consistent with medications.  Recent A1c in hospital on 01/06/21 was 6.1%.    He was admitted to hospital on 01/06/21 after being seen in urgent care for wound infection of great toe left foot -- diagnosed with osteomyelitis and sepsis.  Has amputation of 1st and 2nd toes left foot performed on 01/07/21.  Saw Dr. Excell Seltzer for follow-up on 02/22/21 -- no changes made, is released to return to work.  Has underlying PAD.  Has been drinking protein shakes 3 times a day as recommended by surgical team for healing. Hypoglycemic episodes:no Polydipsia/polyuria: no Visual disturbance: no Chest pain: no Paresthesias: no Glucose Monitoring: yes  Accucheck frequency: Daily  Fasting glucose: 93 to 161 == average 110 range  Post prandial:  Evening:  Before meals: Taking Insulin?: yes  Long acting insulin: Tresiba 18 units  Short acting insulin: Blood Pressure Monitoring: not checking Retinal Examination: Up to Date Foot Exam: Up to Date Pneumovax: Up to Date Influenza: Not up to Date Aspirin: yes    HYPERLIPIDEMIA Continues on ASA and Atorvastatin 20 MG daily Hyperlipidemia status: good compliance Satisfied with current treatment?  yes Side effects:  no Medication compliance: good compliance Supplements: none Aspirin:  yes The 10-year ASCVD risk score Denman George DC Jr., et al., 2013) is: 22.3%   Values used to calculate the score:     Age: 22 years     Sex: Male     Is Non-Hispanic African American: No     Diabetic: Yes     Tobacco smoker: No     Systolic Blood Pressure: 125 mmHg     Is BP treated: No     HDL Cholesterol: 26 mg/dL     Total Cholesterol: 225 mg/dL Chest pain:  no Coronary artery disease:  no Family history CAD:  no Family history early CAD:  no  Relevant past medical, surgical, family and social history reviewed and updated as indicated. Interim medical history since our last visit reviewed. Allergies and medications reviewed and updated.  Review of Systems  Constitutional: Negative for activity change, diaphoresis, fatigue and fever.  Respiratory: Negative for cough, chest tightness, shortness of breath and wheezing.   Cardiovascular: Negative for chest pain, palpitations and leg swelling.  Gastrointestinal: Negative.   Endocrine: Negative for cold intolerance, heat intolerance, polydipsia, polyphagia and polyuria.  Skin: Positive for wound.  Neurological: Negative.   Psychiatric/Behavioral:  Negative.     Per HPI unless specifically indicated above     Objective:    BP 125/77   Pulse 93   Temp 98.1 F (36.7 C) (Oral)   Wt 236 lb 9.6 oz (107.3 kg)   SpO2 97%   BMI 32.09 kg/m   Wt Readings from Last 3 Encounters:  02/22/21 236 lb 9.6 oz (107.3 kg)  01/06/21 233 lb 11 oz (106 kg)  01/06/21 235 lb (106.6 kg)    Physical Exam Vitals and nursing note reviewed.  Constitutional:      General: He is awake. He is not in acute distress.    Appearance: He is well-developed and well-groomed. He is obese. He is not ill-appearing.  HENT:     Head:  Normocephalic and atraumatic.     Right Ear: Hearing normal. No drainage.     Left Ear: Hearing normal. No drainage.  Eyes:     General: Lids are normal.        Right eye: No discharge.        Left eye: No discharge.     Conjunctiva/sclera: Conjunctivae normal.     Pupils: Pupils are equal, round, and reactive to light.  Neck:     Thyroid: No thyromegaly.     Vascular: No carotid bruit.     Trachea: Trachea normal.  Cardiovascular:     Rate and Rhythm: Normal rate and regular rhythm.     Heart sounds: Normal heart sounds, S1 normal and S2 normal. No murmur heard. No gallop.   Pulmonary:     Effort: Pulmonary effort is normal. No accessory muscle usage or respiratory distress.     Breath sounds: Normal breath sounds.  Abdominal:     General: Bowel sounds are normal.     Palpations: Abdomen is soft. There is no hepatomegaly or splenomegaly.  Musculoskeletal:        General: Normal range of motion.     Cervical back: Normal range of motion and neck supple.     Right lower leg: No edema.     Left lower leg: No edema.  Feet:     Comments: Boot in place with dressing to left foot. Skin:    General: Skin is warm and dry.     Capillary Refill: Capillary refill takes less than 2 seconds.  Neurological:     Mental Status: He is alert and oriented to person, place, and time.     Deep Tendon Reflexes: Reflexes are normal and symmetric.  Psychiatric:        Attention and Perception: Attention normal.        Mood and Affect: Mood normal.        Speech: Speech normal.        Behavior: Behavior normal. Behavior is cooperative.        Thought Content: Thought content normal.     Results for orders placed or performed during the hospital encounter of 01/06/21  SARS Coronavirus 2 by RT PCR (hospital order, performed in [facility] hospital lab) Nasopharyngeal Nasopharyngeal Swab   Specimen: Nasopharyngeal Swab  Result Value Ref Range   SARS Coronavirus 2 NEGATIVE NEGATIVE  Blood  culture (routine x 2)   Specimen: BLOOD  Result Value Ref Range   Specimen Description BLOOD LEFT ANTECUBITAL    Special Requests      BOTTLES DRAWN AEROBIC AND ANAEROBIC Blood Culture adequate volume   Culture      NO GROWTH 5 DAYS Performed at [facility], [address] [place], [place] [address]    Report Status 01/11/2021 FINAL   Blood culture (routine x 2)   Specimen: BLOOD  Result Value Ref Range   Specimen Description BLOOD BLOOD RIGHT WRIST    Special Requests      BOTTLES DRAWN AEROBIC AND ANAEROBIC Blood Culture adequate volume   Culture      NO GROWTH 5 DAYS Performed at [facility], [address] [place], [place] [address]    Report Status 01/11/2021 FINAL   Wound or Superficial Culture   Specimen: Toe; Wound  Result Value Ref Range   Specimen Description      TOE LEFT Performed at [facility], [address], [place], [place] [address]    Special Requests      NONE Performed at [facility], [address] [place], [place] [address]    Gram Stain      FEW WBC PRESENT, PREDOMINANTLY PMN MODERATE GRAM POSITIVE COCCI Performed at [facility], [address], [place], [place] [address]    Culture ABUNDANT STAPHYLOCOCCUS AUREUS    Report Status 01/09/2021 FINAL    Organism ID, Bacteria STAPHYLOCOCCUS AUREUS       Susceptibility   Staphylococcus aureus - MIC*    CIPROFLOXACIN <=0.5 SENSITIVE Sensitive     ERYTHROMYCIN <=0.25 SENSITIVE Sensitive     GENTAMICIN <=0.5 SENSITIVE Sensitive     OXACILLIN <=0.25 SENSITIVE Sensitive     TETRACYCLINE <=1 SENSITIVE Sensitive     VANCOMYCIN 1 SENSITIVE Sensitive     TRIMETH/SULFA <=10 SENSITIVE Sensitive     CLINDAMYCIN <=0.25 SENSITIVE Sensitive     RIFAMPIN <=0.5 SENSITIVE Sensitive     Inducible Clindamycin NEGATIVE Sensitive     * ABUNDANT STAPHYLOCOCCUS AUREUS  Aerobic/Anaerobic Culture (surgical/deep wound)   Specimen: Toe; Wound  Result Value Ref Range    Specimen Description TOE LEFT    Special Requests NONE    Gram Stain NO WBC SEEN FEW GRAM POSITIVE COCCI     Culture FEW STAPHYLOCOCCUS AUREUS NO ANAEROBES ISOLATED     Report Status 01/12/2021 FINAL    Organism ID, Bacteria STAPHYLOCOCCUS AUREUS       Susceptibility   Staphylococcus aureus - MIC*    CIPROFLOXACIN <=0.5 SENSITIVE Sensitive     ERYTHROMYCIN <=0.25 SENSITIVE Sensitive     GENTAMICIN <=0.5 SENSITIVE Sensitive     OXACILLIN 0.5 SENSITIVE Sensitive     TETRACYCLINE <=1 SENSITIVE Sensitive     VANCOMYCIN <=0.5 SENSITIVE Sensitive     TRIMETH/SULFA <=10 SENSITIVE Sensitive     CLINDAMYCIN <=0.25 SENSITIVE Sensitive     RIFAMPIN <=0.5 SENSITIVE Sensitive     Inducible Clindamycin NEGATIVE Sensitive     * FEW STAPHYLOCOCCUS AUREUS  MRSA PCR Screening   Specimen: Nasopharyngeal  Result Value Ref Range   MRSA by PCR NEGATIVE NEGATIVE  Aerobic/Anaerobic Culture (surgical/deep wound)   Specimen: PATH Other; Tissue  Result Value Ref Range   Specimen Description      ABSCESS LEFT BIG TOE Performed at [facility], [address] [place], [place] [address]    Special Requests      NONE Performed at [facility], [address] [place], [place] [address]    Gram Stain      RARE WBC PRESENT, PREDOMINANTLY PMN RARE GRAM POSITIVE COCCI IN CLUSTERS    Culture      FEW STAPHYLOCOCCUS AUREUS NO ANAEROBES ISOLATED Performed at [facility], [address], [place], [place] [address]    Report Status 01/12/2021 FINAL    Organism ID, Bacteria STAPHYLOCOCCUS AUREUS       Susceptibility   Staphylococcus aureus - MIC*    CIPROFLOXACIN <=0.5 SENSITIVE Sensitive     ERYTHROMYCIN <=0.25 SENSITIVE Sensitive     GENTAMICIN <=0.5 SENSITIVE Sensitive     OXACILLIN <=0.25 SENSITIVE Sensitive     TETRACYCLINE <=1 SENSITIVE Sensitive     VANCOMYCIN <=0.5 SENSITIVE Sensitive     TRIMETH/SULFA <=10 SENSITIVE Sensitive     CLINDAMYCIN <=0.25 SENSITIVE  Sensitive     RIFAMPIN <=0.5 SENSITIVE Sensitive     Inducible Clindamycin NEGATIVE Sensitive     * FEW STAPHYLOCOCCUS AUREUS  Aerobic/Anaerobic Culture (surgical/deep wound)   Specimen: PATH Bone biopsy; Tissue  Result Value Ref Range   Specimen Description      BONE BONE CULTURE L BIG TOE Performed at [facility], [address] [place], [place] [address]    Special Requests      NONE Performed at [facility], [address] [place], [place] [address]    Gram Stain      FEW WBC PRESENT,BOTH PMN AND MONONUCLEAR FEW GRAM POSITIVE COCCI IN PAIRS    Culture      MODERATE STAPHYLOCOCCUS AUREUS NO ANAEROBES ISOLATED Performed at [facility], [address], [place], [place] [address]    Report Status 01/12/2021 FINAL    Organism ID, Bacteria STAPHYLOCOCCUS AUREUS       Susceptibility   Staphylococcus aureus - MIC*    CIPROFLOXACIN <=0.5 SENSITIVE Sensitive     ERYTHROMYCIN <=0.25 SENSITIVE Sensitive     GENTAMICIN <=0.5 SENSITIVE Sensitive     OXACILLIN <=0.25 SENSITIVE Sensitive     TETRACYCLINE <=1 SENSITIVE Sensitive     VANCOMYCIN 1 SENSITIVE Sensitive     TRIMETH/SULFA <=10 SENSITIVE Sensitive     CLINDAMYCIN <=0.25 SENSITIVE Sensitive     RIFAMPIN <=0.5 SENSITIVE Sensitive     Inducible Clindamycin NEGATIVE Sensitive     * MODERATE STAPHYLOCOCCUS AUREUS  Lactic acid, plasma  Result Value Ref Range   Lactic Acid, Venous 0.9 0.5 - 1.9 mmol/L  Comprehensive metabolic panel  Result Value Ref Range   Sodium 135 135 - 145 mmol/L   Potassium 3.8 3.5 - 5.1 mmol/L   Chloride 99 98 - 111 mmol/L   CO2 24 22 - 32 mmol/L   Glucose, Bld 77 70 - 99 mg/dL   BUN 27 (H) 6 - 20 mg/dL   Creatinine, Ser 4.40 0.61 - 1.24 mg/dL   Calcium 9.3 8.9 - 34.7 mg/dL   Total Protein 7.7 6.5 - 8.1 g/dL   Albumin 3.8 3.5 - 5.0 g/dL   AST 23 15 - 41 U/L   ALT 19 0 - 44 U/L   Alkaline Phosphatase 49 38 - 126 U/L   Total Bilirubin 0.9 0.3 - 1.2 mg/dL   GFR,  Estimated >42 >59 mL/min   Anion gap 12 5 - 15  CBC with Differential  Result Value Ref Range   WBC 15.9 (H) 4.0 - 10.5 K/uL   RBC 3.68 (L) 4.22 - 5.81 MIL/uL   Hemoglobin 11.8 (L) 13.0 - 17.0 g/dL   HCT 56.3 (L) 87.5 - 64.3 %   MCV 92.1 80.0 - 100.0 fL   MCH 32.1 26.0 - 34.0 pg   MCHC 34.8 30.0 - 36.0 g/dL   RDW 32.9 51.8 - 84.1 %   Platelets 219 150 - 400 K/uL  nRBC 0.0 0.0 - 0.2 %   Neutrophils Relative % 85 %   Neutro Abs 13.4 (H) 1.7 - 7.7 K/uL   Lymphocytes Relative 7 %   Lymphs Abs 1.1 0.7 - 4.0 K/uL   Monocytes Relative 7 %   Monocytes Absolute 1.1 (H) 0.1 - 1.0 K/uL   Eosinophils Relative 0 %   Eosinophils Absolute 0.1 0.0 - 0.5 K/uL   Basophils Relative 0 %   Basophils Absolute 0.0 0.0 - 0.1 K/uL   Immature Granulocytes 1 %   Abs Immature Granulocytes 0.08 (H) 0.00 - 0.07 K/uL  Urinalysis, Complete w Microscopic  Result Value Ref Range   Color, Urine YELLOW (A) YELLOW   APPearance CLEAR (A) CLEAR   Specific Gravity, Urine 1.021 1.005 - 1.030   pH 5.0 5.0 - 8.0   Glucose, UA NEGATIVE NEGATIVE mg/dL   Hgb urine dipstick SMALL (A) NEGATIVE   Bilirubin Urine NEGATIVE NEGATIVE   Ketones, ur 20 (A) NEGATIVE mg/dL   Protein, ur 427 (A) NEGATIVE mg/dL   Nitrite NEGATIVE NEGATIVE   Leukocytes,Ua NEGATIVE NEGATIVE   WBC, UA 0-5 0 - 5 WBC/hpf   Bacteria, UA NONE SEEN NONE SEEN   Squamous Epithelial / LPF NONE SEEN 0 - 5   Mucus PRESENT   Hemoglobin A1c  Result Value Ref Range   Hgb A1c MFr Bld 6.1 (H) 4.8 - 5.6 %   Mean Plasma Glucose 128.37 mg/dL  CBC  Result Value Ref Range   WBC 11.4 (H) 4.0 - 10.5 K/uL   RBC 3.38 (L) 4.22 - 5.81 MIL/uL   Hemoglobin 10.7 (L) 13.0 - 17.0 g/dL   HCT 06.2 (L) 37.6 - 28.3 %   MCV 91.7 80.0 - 100.0 fL   MCH 31.7 26.0 - 34.0 pg   MCHC 34.5 30.0 - 36.0 g/dL   RDW 15.1 (L) 76.1 - 60.7 %   Platelets 180 150 - 400 K/uL   nRBC 0.0 0.0 - 0.2 %  Basic metabolic panel  Result Value Ref Range   Sodium 136 135 - 145 mmol/L   Potassium  3.7 3.5 - 5.1 mmol/L   Chloride 102 98 - 111 mmol/L   CO2 25 22 - 32 mmol/L   Glucose, Bld 123 (H) 70 - 99 mg/dL   BUN 14 6 - 20 mg/dL   Creatinine, Ser 3.71 0.61 - 1.24 mg/dL   Calcium 8.6 (L) 8.9 - 10.3 mg/dL   GFR, Estimated >06 >26 mL/min   Anion gap 9 5 - 15  Glucose, capillary  Result Value Ref Range   Glucose-Capillary 157 (H) 70 - 99 mg/dL   Comment 1 Notify RN   Glucose, capillary  Result Value Ref Range   Glucose-Capillary 143 (H) 70 - 99 mg/dL  Glucose, capillary  Result Value Ref Range   Glucose-Capillary 154 (H) 70 - 99 mg/dL   Comment 1 Notify RN    Comment 2 Document in Chart   Glucose, capillary  Result Value Ref Range   Glucose-Capillary 316 (H) 70 - 99 mg/dL   Comment 1 Notify RN    Comment 2 Document in Chart   CBC with Differential/Platelet  Result Value Ref Range   WBC 14.1 (H) 4.0 - 10.5 K/uL   RBC 3.40 (L) 4.22 - 5.81 MIL/uL   Hemoglobin 10.8 (L) 13.0 - 17.0 g/dL   HCT 94.8 (L) 54.6 - 27.0 %   MCV 92.1 80.0 - 100.0 fL   MCH 31.8 26.0 - 34.0 pg  MCHC 34.5 30.0 - 36.0 g/dL   RDW 13.0 86.5 - 78.4 %   Platelets 199 150 - 400 K/uL   nRBC 0.0 0.0 - 0.2 %   Neutrophils Relative % 85 %   Neutro Abs 12.1 (H) 1.7 - 7.7 K/uL   Lymphocytes Relative 9 %   Lymphs Abs 1.3 0.7 - 4.0 K/uL   Monocytes Relative 5 %   Monocytes Absolute 0.6 0.1 - 1.0 K/uL   Eosinophils Relative 0 %   Eosinophils Absolute 0.0 0.0 - 0.5 K/uL   Basophils Relative 0 %   Basophils Absolute 0.0 0.0 - 0.1 K/uL   Immature Granulocytes 1 %   Abs Immature Granulocytes 0.09 (H) 0.00 - 0.07 K/uL  Creatinine, serum  Result Value Ref Range   Creatinine, Ser 0.66 0.61 - 1.24 mg/dL   GFR, Estimated >69 >62 mL/min  Glucose, capillary  Result Value Ref Range   Glucose-Capillary 335 (H) 70 - 99 mg/dL   Comment 1 Notify RN   Glucose, capillary  Result Value Ref Range   Glucose-Capillary 172 (H) 70 - 99 mg/dL  Glucose, capillary  Result Value Ref Range   Glucose-Capillary 256 (H) 70 - 99  mg/dL  Glucose, capillary  Result Value Ref Range   Glucose-Capillary 107 (H) 70 - 99 mg/dL  Glucose, capillary  Result Value Ref Range   Glucose-Capillary 265 (H) 70 - 99 mg/dL  Glucose, capillary  Result Value Ref Range   Glucose-Capillary 131 (H) 70 - 99 mg/dL  Glucose, capillary  Result Value Ref Range   Glucose-Capillary 166 (H) 70 - 99 mg/dL  CBG monitoring, ED  Result Value Ref Range   Glucose-Capillary 72 70 - 99 mg/dL  CBG monitoring, ED  Result Value Ref Range   Glucose-Capillary 98 70 - 99 mg/dL  Surgical pathology  Result Value Ref Range   SURGICAL PATHOLOGY      SURGICAL PATHOLOGY CASE: [id number] PATIENT: [name] Surgical Pathology Report     Specimen Submitted: A. 1st and 2nd toe, left  Clinical History: Left foot infection    DIAGNOSIS: A. FOOT, LEFT FIRST AND SECOND TOE; AMPUTATION: - ACUTE OSTEOMYELITIS. - ULCERATION AND ACUTE SUPPURATIVE INFLAMMATION. - VIABLE SKIN AND SOFT TISSUE MARGINS WITH ASSOCIATED ACTIVE INFLAMMATION. - VIABLE BONY MARGINS WITHOUT ACUTE INFLAMMATION.  GROSS DESCRIPTION: A. Labeled: Left first and second toe Received: Formalin Collection time: 9:45 AM on 01/07/2021 Placed into formalin time: 9:46 AM on 01/07/2021 Size: Aggregate, 9.5 x 8.5 x 4.2 cm Description of lesion(s): Received are 2 toes each with a toenail.  The larger toe has a presumed medial 1.2 x 1.1 cm area of ulceration and a 3.7 x 2.6 cm area of green-brown adjacent discoloration.  The remaining skin is tan-white and lobulated.  The smaller toe has diffusely tan-white, lobulated, and grossly unremarkable skin.   There is an additional fragment of bone which is surrounded by cartilage and soft tissue.  The last fragment is a portion of disarticulated bone with an opposing cut surface.  These bone fragments are firm and grossly unremarkable. Proximal margin: The proximal resection margins are grossly viable with tan-white smooth skin, tan soft  tissue, and tan firm disarticulated bone.  The additional fragment of bone with the cut surface is presumed to be the true bone resection margin of the larger toe.  The area of ulceration is 2.4 cm to close to skin and soft tissue resection margin and the area of discoloration is 0.6 cm to the closest skin and  soft tissue resection margin.  The resection margins are inked blue. Bone: The bone underlying the area of ulceration is fractured but firm. Other findings: The smaller second toe has no distinct lesions.  Block summary: 1 - 2 - bone cut resection margin, perpendicularly sectioned and submitted entirely, following decalcification 3  - skin and soft tissue resection margin closest to area of ulceration, perpendicularly sectioned 4 - skin and soft tissue resection margin closest to area of skin discoloration, perpendicularly sectioned 5 - representative ulceration and area of discoloration with underlying bone, following decalcification  Tissue decalcification: Yes, cassettes 1, 2, and 5  Final Diagnosis performed by Katherine Mantle, MD.   Electronically signed 01/11/2021 10:16:59AM The electronic signature indicates that the named Attending Pathologist has evaluated the specimen Technical component performed at [facility], [address], [place], [place] [address] Lab: [phone] Dir: Mackenize Delgadillo Schimke, MD, MMM  Professional component performed at [facility], [address] [place], [place], [place] [address] Lab: [phone] Dir: Georgiann Cocker. Oneita Kras, MD       Assessment & Plan:   Problem List Items Addressed This Visit      Cardiovascular and Mediastinum   PAD (peripheral artery disease) (HCC)    Chronic, ongoing with recent wound, followed by podiatry at this time.  Will keep BP and cholesterol + sugars under good control. Continue aspirin. Consider vascular referral in future.  CBC and TSH today.  Return in 3 months.      Relevant Medications   atorvastatin  (LIPITOR) 20 MG tablet   Other Relevant Orders   CBC with Differential/Platelet     Endocrine   Hyperlipidemia associated with type 2 diabetes mellitus (HCC)    Chronic, ongoing.  Continue current medication regimen and adjust as needed.  CMP and lipid panel today.         Relevant Medications   atorvastatin (LIPITOR) 20 MG tablet   glipiZIDE (GLUCOTROL) 5 MG tablet   insulin degludec (TRESIBA FLEXTOUCH) 100 UNIT/ML FlexTouch Pen   metFORMIN (GLUCOPHAGE) 1000 MG tablet   Other Relevant Orders   Comprehensive metabolic panel   Lipid Panel w/o Chol/HDL Ratio   Type 2 diabetes mellitus with diabetic neuropathy, with long-term current use of insulin (HCC)    Chronic, ongoing with recent A1c 6.1%.  Urine ALB 150 today, has been drinking protein shakes TID.  Discussed with him that will recheck next visit and if ongoing elevations highly recommend starting low dose ACE or ARB -- educated him on these medications and kidney benefit.  At this time continue current medication regimen and adjust as needed.  Check BS 2-3 times daily and document for visits.  Return in 3 months.      Relevant Medications   atorvastatin (LIPITOR) 20 MG tablet   glipiZIDE (GLUCOTROL) 5 MG tablet   insulin degludec (TRESIBA FLEXTOUCH) 100 UNIT/ML FlexTouch Pen   metFORMIN (GLUCOPHAGE) 1000 MG tablet   Other Relevant Orders   Microalbumin, Urine Waived   TSH     Other   Obesity    BMI 32.09.  Recommended eating smaller high protein, low fat meals more frequently and exercising 30 mins a day 5 times a week with a goal of 10-15lb weight loss in the next 3 months. Patient voiced their understanding and motivation to adhere to these recommendations.       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   Continue collaboration with podiatry, recent note reviewed.  Recommend continued good control of cholesterol levels and A1c -- check lipid panel and CBC today.  Return in 3 months, sooner if any worsening symptoms or wounds.       Other Visit Diagnoses    B12 deficiency       History of low levels reported with chronic Metformin use and neuropathy pain -- check today and initate supplement as needed.   Relevant Orders   B12       Follow up plan: Return in about 3 months (around 05/25/2021) for T2DM, HTN/HLD.

## 2021-02-22 NOTE — Assessment & Plan Note (Signed)
New diagnosis with underlying PAD.  Continue collaboration with podiatry, recent note reviewed.  Recommend continued good control of cholesterol levels and A1c -- check lipid panel and CBC today.  Return in 3 months, sooner if any worsening symptoms or wounds.

## 2021-02-22 NOTE — Assessment & Plan Note (Signed)
Chronic, ongoing.  Continue current medication regimen and adjust as needed.  CMP and lipid panel today. 

## 2021-02-23 LAB — COMPREHENSIVE METABOLIC PANEL
ALT: 17 IU/L (ref 0–44)
AST: 18 IU/L (ref 0–40)
Albumin/Globulin Ratio: 2 (ref 1.2–2.2)
Albumin: 4.3 g/dL (ref 3.8–4.9)
Alkaline Phosphatase: 64 IU/L (ref 44–121)
BUN/Creatinine Ratio: 23 — ABNORMAL HIGH (ref 9–20)
BUN: 19 mg/dL (ref 6–24)
Bilirubin Total: 0.6 mg/dL (ref 0.0–1.2)
CO2: 25 mmol/L (ref 20–29)
Calcium: 9.2 mg/dL (ref 8.7–10.2)
Chloride: 100 mmol/L (ref 96–106)
Creatinine, Ser: 0.81 mg/dL (ref 0.76–1.27)
Globulin, Total: 2.2 g/dL (ref 1.5–4.5)
Glucose: 190 mg/dL — ABNORMAL HIGH (ref 65–99)
Potassium: 4 mmol/L (ref 3.5–5.2)
Sodium: 139 mmol/L (ref 134–144)
Total Protein: 6.5 g/dL (ref 6.0–8.5)
eGFR: 103 mL/min/{1.73_m2} (ref >59)

## 2021-02-23 LAB — LIPID PANEL W/O CHOL/HDL RATIO
Cholesterol, Total: 146 mg/dL (ref 100–199)
HDL: 32 mg/dL — ABNORMAL LOW (ref >39)
LDL Chol Calc (NIH): 83 mg/dL (ref 0–99)
Triglycerides: 183 mg/dL — ABNORMAL HIGH (ref 0–149)
VLDL Cholesterol Cal: 31 mg/dL (ref 5–40)

## 2021-02-23 LAB — CBC WITH DIFFERENTIAL/PLATELET
Basophils Absolute: 0 10*3/uL (ref 0.0–0.2)
Basos: 0 %
EOS (ABSOLUTE): 0.1 10*3/uL (ref 0.0–0.4)
Eos: 2 %
Hematocrit: 37.7 % (ref 37.5–51.0)
Hemoglobin: 13.1 g/dL (ref 13.0–17.7)
Immature Grans (Abs): 0 10*3/uL (ref 0.0–0.1)
Immature Granulocytes: 0 %
Lymphocytes Absolute: 1.5 10*3/uL (ref 0.7–3.1)
Lymphs: 20 %
MCH: 31.7 pg (ref 26.6–33.0)
MCHC: 34.7 g/dL (ref 31.5–35.7)
MCV: 91 fL (ref 79–97)
Monocytes Absolute: 0.4 10*3/uL (ref 0.1–0.9)
Monocytes: 6 %
Neutrophils Absolute: 5.3 10*3/uL (ref 1.4–7.0)
Neutrophils: 72 %
Platelets: 170 10*3/uL (ref 150–450)
RBC: 4.13 x10E6/uL — ABNORMAL LOW (ref 4.14–5.80)
RDW: 13.2 % (ref 11.6–15.4)
WBC: 7.4 10*3/uL (ref 3.4–10.8)

## 2021-02-23 LAB — VITAMIN B12: Vitamin B-12: 322 pg/mL (ref 232–1245)

## 2021-02-23 LAB — TSH: TSH: 1.28 u[IU]/mL (ref 0.450–4.500)

## 2021-02-23 NOTE — Progress Notes (Signed)
Contacted via MyChart   Good morning Mr. Bjelland, your labs have returned: - Overall CBC is improved -- no further elevation in white blood cell count and anemia is improved with increase in hemoglobin and hematocrit. - Kidney function (eGFR and creatinine) are nice and normal, as is liver function (AST/ALT). - Thyroid is normal - Your B12 level is on low side of normal at 322, goal >300.  Low levels are seen at times with chronic Metformin use.  Low levels can lead to neuropathy discomfort or brain fog.  I would recommend starting Vitamin B12 1000 MCG daily, which you can obtain over the counter in vitamin section -- comes in many forms. - Cholesterol levels show LDL slightly above goal of <70 for stroke prevention.  Were you fasting? If not may be good to check this fasting next visit and if remains above goal would recommend increase in Atorvastatin dose.  Any questions? Keep being awesome!!  Thank you for allowing me to participate in your care. Kindest regards, Jowanda Heeg

## 2021-02-27 DIAGNOSIS — E113393 Type 2 diabetes mellitus with moderate nonproliferative diabetic retinopathy without macular edema, bilateral: Secondary | ICD-10-CM | POA: Diagnosis not present

## 2021-03-15 IMAGING — CR DG FOOT COMPLETE 3+V*L*
1 series · 3 of 3 positions shown · non-contrast
Comparison: 07/19/2017

CLINICAL DATA: Osteomyelitis infection first and second toe

EXAM:
LEFT FOOT - COMPLETE 3+ VIEW

[Series 1: dg foot complete left · 0.14mm/px · 3 of 3 slices shown]
[im 1/3]
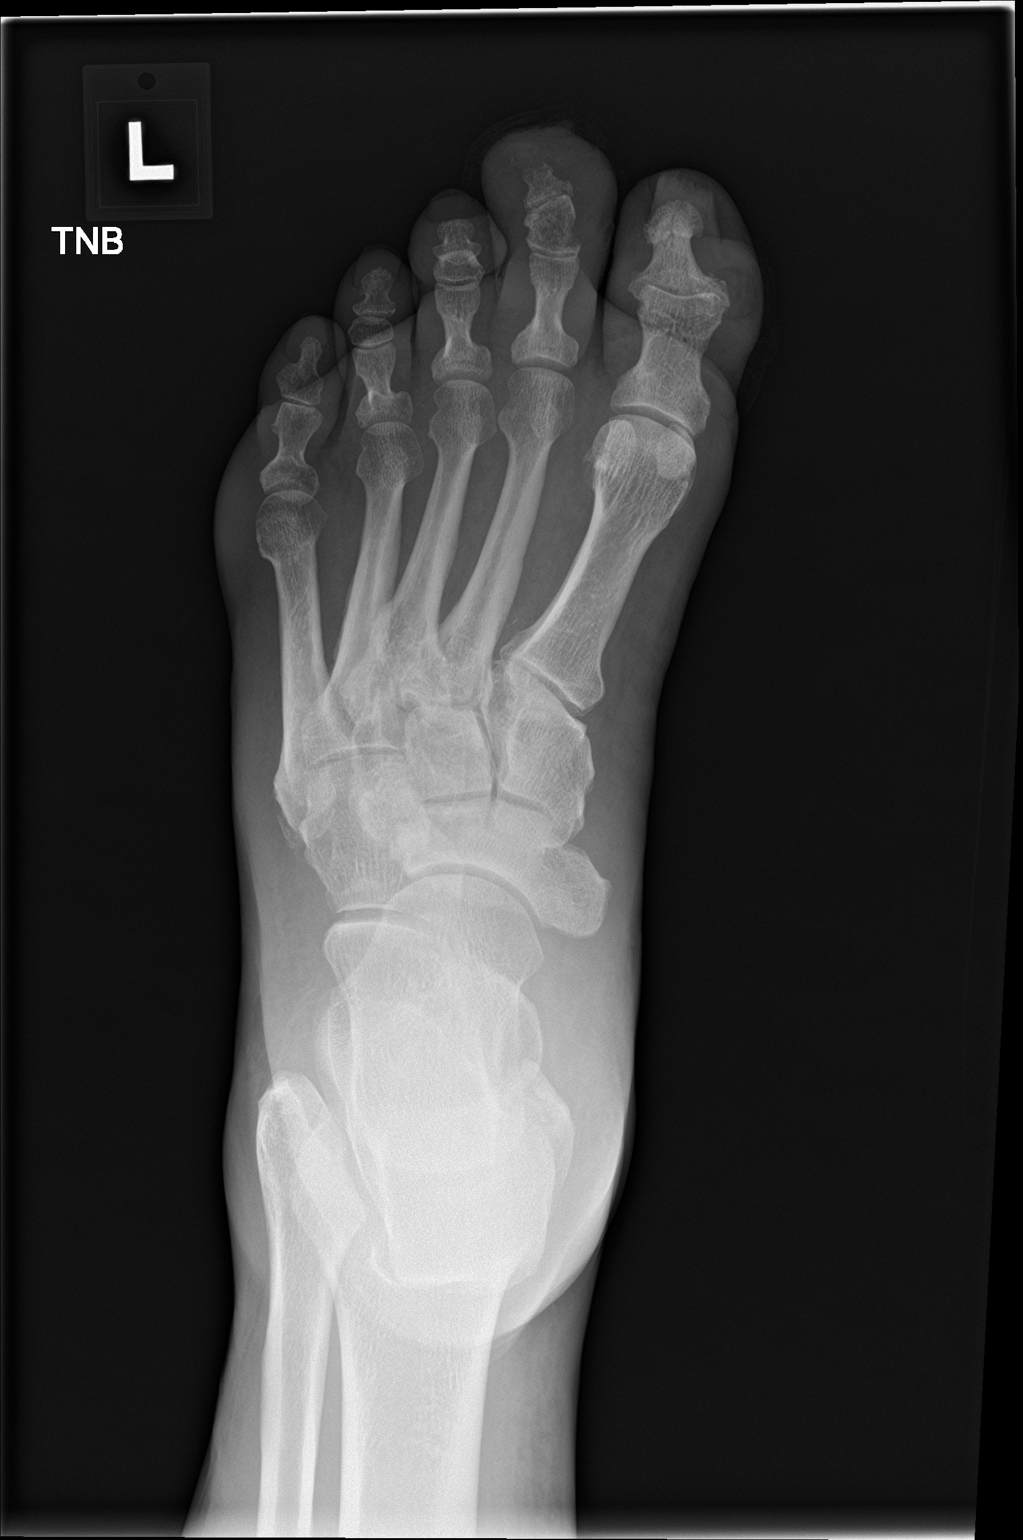
[im 2/3]
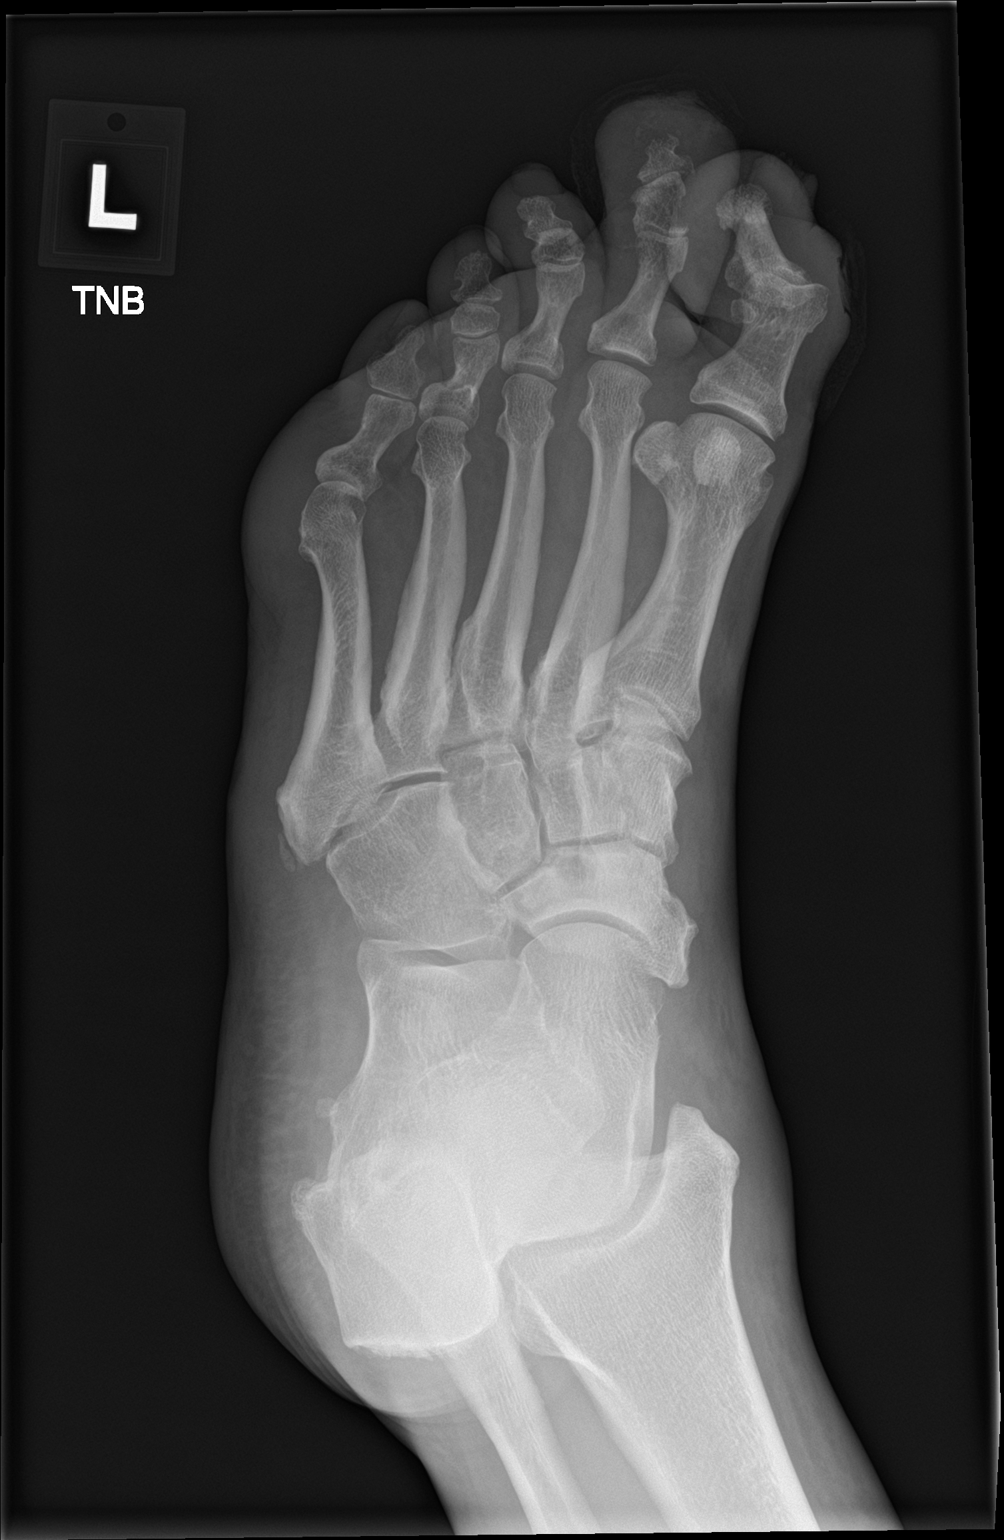
[im 3/3]
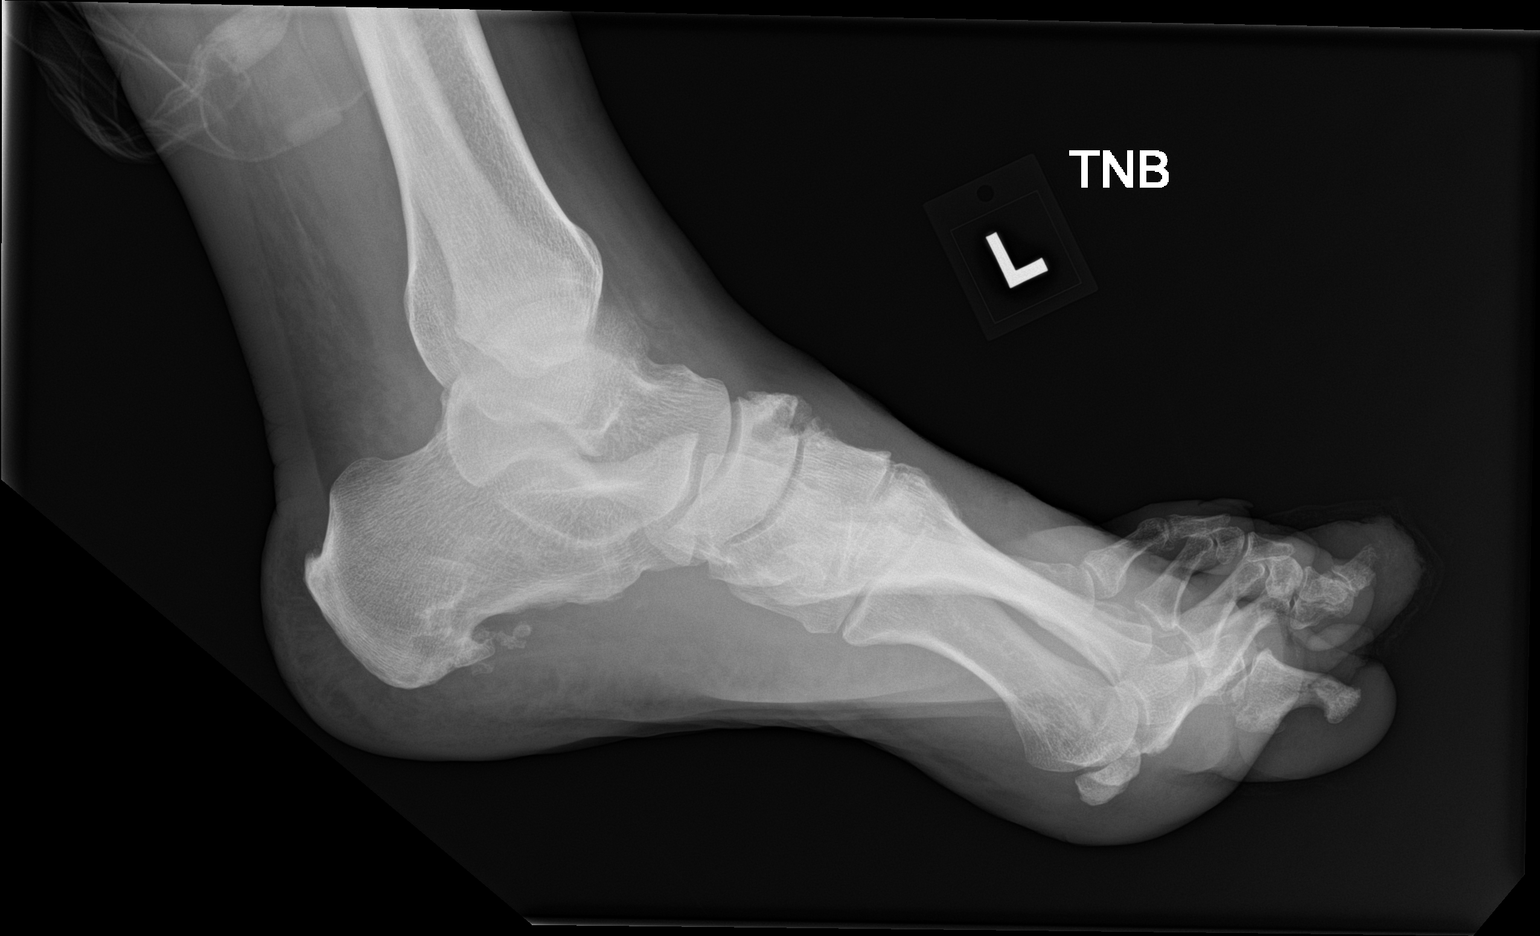

[3 of 3 positions shown; findings below may reference images not displayed]

FINDINGS: No acute displaced fracture or malalignment. Ulcer of the great toe
at the level of the IP joint. Mild bony destructive change at the
tuft of the second distal phalanx. Wound or ulcer with soft tissue
swelling of the second digit. Large plantar calcaneal spur with
adjacent calcifications.
IMPRESSION: 1. Mild bony destructive change at the tuft of the second distal
phalanx concerning for osteomyelitis.
2. Ulcer of the great toe at the level of the IP joint.

## 2021-04-03 DIAGNOSIS — E1142 Type 2 diabetes mellitus with diabetic polyneuropathy: Secondary | ICD-10-CM | POA: Diagnosis not present

## 2021-04-03 DIAGNOSIS — L97522 Non-pressure chronic ulcer of other part of left foot with fat layer exposed: Secondary | ICD-10-CM | POA: Diagnosis not present

## 2021-04-03 DIAGNOSIS — Z89421 Acquired absence of other right toe(s): Secondary | ICD-10-CM | POA: Diagnosis not present

## 2021-04-03 DIAGNOSIS — Z89412 Acquired absence of left great toe: Secondary | ICD-10-CM | POA: Diagnosis not present

## 2021-04-19 DIAGNOSIS — E1142 Type 2 diabetes mellitus with diabetic polyneuropathy: Secondary | ICD-10-CM | POA: Diagnosis not present

## 2021-04-19 DIAGNOSIS — L97522 Non-pressure chronic ulcer of other part of left foot with fat layer exposed: Secondary | ICD-10-CM | POA: Diagnosis not present

## 2021-05-10 DIAGNOSIS — I739 Peripheral vascular disease, unspecified: Secondary | ICD-10-CM | POA: Diagnosis not present

## 2021-05-10 DIAGNOSIS — L97522 Non-pressure chronic ulcer of other part of left foot with fat layer exposed: Secondary | ICD-10-CM | POA: Diagnosis not present

## 2021-05-10 DIAGNOSIS — E1142 Type 2 diabetes mellitus with diabetic polyneuropathy: Secondary | ICD-10-CM | POA: Diagnosis not present

## 2021-05-23 DIAGNOSIS — E1142 Type 2 diabetes mellitus with diabetic polyneuropathy: Secondary | ICD-10-CM | POA: Diagnosis not present

## 2021-05-23 DIAGNOSIS — L851 Acquired keratosis [keratoderma] palmaris et plantaris: Secondary | ICD-10-CM | POA: Diagnosis not present

## 2021-05-23 DIAGNOSIS — I739 Peripheral vascular disease, unspecified: Secondary | ICD-10-CM | POA: Diagnosis not present

## 2021-05-25 ENCOUNTER — Encounter: Payer: Self-pay | Admitting: Nurse Practitioner

## 2021-05-25 ENCOUNTER — Other Ambulatory Visit: Payer: Self-pay

## 2021-05-25 ENCOUNTER — Ambulatory Visit: Payer: BC Managed Care – PPO | Admitting: Nurse Practitioner

## 2021-05-25 VITALS — BP 137/80 | HR 80 | Temp 98.7°F | Ht 69.84 in | Wt 246.1 lb

## 2021-05-25 DIAGNOSIS — Z794 Long term (current) use of insulin: Secondary | ICD-10-CM

## 2021-05-25 DIAGNOSIS — I739 Peripheral vascular disease, unspecified: Secondary | ICD-10-CM

## 2021-05-25 DIAGNOSIS — E785 Hyperlipidemia, unspecified: Secondary | ICD-10-CM

## 2021-05-25 DIAGNOSIS — E538 Deficiency of other specified B group vitamins: Secondary | ICD-10-CM | POA: Diagnosis not present

## 2021-05-25 DIAGNOSIS — E1169 Type 2 diabetes mellitus with other specified complication: Secondary | ICD-10-CM

## 2021-05-25 DIAGNOSIS — E1140 Type 2 diabetes mellitus with diabetic neuropathy, unspecified: Secondary | ICD-10-CM | POA: Diagnosis not present

## 2021-05-25 NOTE — Assessment & Plan Note (Signed)
Chronic, ongoing.  Continue current medication regimen and adjust as needed.  CMP today. 

## 2021-05-25 NOTE — Progress Notes (Signed)
BP 137/80   Pulse 80   Temp 98.7 F (37.1 C)   Ht 5' 9.84" (1.774 m)   Wt 246 lb 2 oz (111.6 kg)   SpO2 96%   BMI 35.47 kg/m    Subjective:    Patient ID: [name], male    DOB: [birth year], 60 y.o.   MRN: [id number]  HPI: Thoams Siefert is a 60 y.o. male  Chief Complaint  Patient presents with   Diabetes   Hyperlipidemia   Hypertension   DIABETES Hypoglycemic episodes:no Polydipsia/polyuria: no Visual disturbance: no Chest pain: no Paresthesias: no Glucose Monitoring: yes  Accucheck frequency: Daily  Fasting glucose:  Post prandial:  Evening:  Before meals: Taking Insulin?: yes  Long acting insulin:  Short acting insulin: Blood Pressure Monitoring: not checking Retinal Examination: Up to Date Foot Exam: Up to Date Diabetic Education: Not Completed Pneumovax: Up to Date Influenza: Up to Date Aspirin: no   Denies HA, CP, SOB, dizziness, palpitations, visual changes, and lower extremity swelling.  Relevant past medical, surgical, family and social history reviewed and updated as indicated. Interim medical history since our last visit reviewed. Allergies and medications reviewed and updated.  Review of Systems  Eyes:  Negative for visual disturbance.  Respiratory:  Negative for chest tightness and shortness of breath.   Cardiovascular:  Negative for chest pain, palpitations and leg swelling.  Endocrine: Negative for polydipsia and polyuria.  Neurological:  Positive for numbness. Negative for dizziness, light-headedness and headaches.   Per HPI unless specifically indicated above     Objective:    BP 137/80   Pulse 80   Temp 98.7 F (37.1 C)   Ht 5' 9.84" (1.774 m)   Wt 246 lb 2 oz (111.6 kg)   SpO2 96%   BMI 35.47 kg/m   Wt Readings from Last 3 Encounters:  05/25/21 246 lb 2 oz (111.6 kg)  02/22/21 236 lb 9.6 oz (107.3 kg)  01/06/21 233 lb 11 oz (106 kg)    Physical Exam Vitals and nursing note reviewed.  Constitutional:      General:  He is not in acute distress.    Appearance: Normal appearance. He is not ill-appearing, toxic-appearing or diaphoretic.  HENT:     Head: Normocephalic.     Right Ear: External ear normal.     Left Ear: External ear normal.     Nose: Nose normal. No congestion or rhinorrhea.     Mouth/Throat:     Mouth: Mucous membranes are moist.  Eyes:     General:        Right eye: No discharge.        Left eye: No discharge.     Extraocular Movements: Extraocular movements intact.     Conjunctiva/sclera: Conjunctivae normal.     Pupils: Pupils are equal, round, and reactive to light.  Cardiovascular:     Rate and Rhythm: Normal rate and regular rhythm.     Heart sounds: No murmur heard. Pulmonary:     Effort: Pulmonary effort is normal. No respiratory distress.     Breath sounds: Normal breath sounds. No wheezing, rhonchi or rales.  Abdominal:     General: Abdomen is flat. Bowel sounds are normal.  Musculoskeletal:     Cervical back: Normal range of motion and neck supple.  Skin:    General: Skin is warm and dry.     Capillary Refill: Capillary refill takes less than 2 seconds.  Neurological:     General: No focal  deficit present.     Mental Status: He is alert and oriented to person, place, and time.  Psychiatric:        Mood and Affect: Mood normal.        Behavior: Behavior normal.        Thought Content: Thought content normal.        Judgment: Judgment normal.    Results for orders placed or performed in visit on 02/22/21  Microalbumin, Urine Waived  Result Value Ref Range   Microalb, Ur Waived 150 (H) 0 - 19 mg/L   Creatinine, Urine Waived 300 10 - 300 mg/dL   Microalb/Creat Ratio 30-300 (H) <30 mg/g  CBC with Differential/Platelet  Result Value Ref Range   WBC 7.4 3.4 - 10.8 x10E3/uL   RBC 4.13 (L) 4.14 - 5.80 x10E6/uL   Hemoglobin 13.1 13.0 - 17.7 g/dL   Hematocrit 37.7 37.5 - 51.0 %   MCV 91 79 - 97 fL   MCH 31.7 26.6 - 33.0 pg   MCHC 34.7 31.5 - 35.7 g/dL   RDW 13.2  11.6 - 15.4 %   Platelets 170 150 - 450 x10E3/uL   Neutrophils 72 Not Estab. %   Lymphs 20 Not Estab. %   Monocytes 6 Not Estab. %   Eos 2 Not Estab. %   Basos 0 Not Estab. %   Neutrophils Absolute 5.3 1.4 - 7.0 x10E3/uL   Lymphocytes Absolute 1.5 0.7 - 3.1 x10E3/uL   Monocytes Absolute 0.4 0.1 - 0.9 x10E3/uL   EOS (ABSOLUTE) 0.1 0.0 - 0.4 x10E3/uL   Basophils Absolute 0.0 0.0 - 0.2 x10E3/uL   Immature Granulocytes 0 Not Estab. %   Immature Grans (Abs) 0.0 0.0 - 0.1 x10E3/uL  Comprehensive metabolic panel  Result Value Ref Range   Glucose 190 (H) 65 - 99 mg/dL   BUN 19 6 - 24 mg/dL   Creatinine, Ser 0.81 0.76 - 1.27 mg/dL   eGFR 103 >59 mL/min/1.73   BUN/Creatinine Ratio 23 (H) 9 - 20   Sodium 139 134 - 144 mmol/L   Potassium 4.0 3.5 - 5.2 mmol/L   Chloride 100 96 - 106 mmol/L   CO2 25 20 - 29 mmol/L   Calcium 9.2 8.7 - 10.2 mg/dL   Total Protein 6.5 6.0 - 8.5 g/dL   Albumin 4.3 3.8 - 4.9 g/dL   Globulin, Total 2.2 1.5 - 4.5 g/dL   Albumin/Globulin Ratio 2.0 1.2 - 2.2   Bilirubin Total 0.6 0.0 - 1.2 mg/dL   Alkaline Phosphatase 64 44 - 121 IU/L   AST 18 0 - 40 IU/L   ALT 17 0 - 44 IU/L  Lipid Panel w/o Chol/HDL Ratio  Result Value Ref Range   Cholesterol, Total 146 100 - 199 mg/dL   Triglycerides 183 (H) 0 - 149 mg/dL   HDL 32 (L) >39 mg/dL   VLDL Cholesterol Cal 31 5 - 40 mg/dL   LDL Chol Calc (NIH) 83 0 - 99 mg/dL  TSH  Result Value Ref Range   TSH 1.280 0.450 - 4.500 uIU/mL  B12  Result Value Ref Range   Vitamin B-12 322 232 - 1,245 pg/mL      Assessment & Plan:   Problem List Items Addressed This Visit       Cardiovascular and Mediastinum   PAD (peripheral artery disease) (HCC)    Chronic, ongoing with recent wound, followed by podiatry at this time.  Will keep BP and cholesterol + sugars under good control. Continue  aspirin. Consider vascular referral in future.  Labs ordered today.  Return in 6 months.         Endocrine   Hyperlipidemia associated  with type 2 diabetes mellitus (HCC)    Chronic, ongoing.  Continue current medication regimen and adjust as needed.  CMP today.          Type 2 diabetes mellitus with diabetic neuropathy, with long-term current use of insulin (HCC) - Primary    Chronic, ongoing with recent A1c 6.1%.  Labs ordered today.  Will Discuss ACE/ARB at next visit.  Check BS 2-3 times daily and document for visits.  Return in 6 months.       Relevant Orders   Comp Met (CMET)   HgB A1c   Other Visit Diagnoses     Vitamin B12 deficiency       Labs drawn today. Will see if this is contributing to patient's neuropathy.   Relevant Orders   Vitamin B12        Follow up plan: Return in about 6 months (around 11/24/2021) for Physical and Fasting labs.

## 2021-05-25 NOTE — Assessment & Plan Note (Signed)
Chronic, ongoing with recent A1c 6.1%.  Labs ordered today.  Will Discuss ACE/ARB at next visit.  Check BS 2-3 times daily and document for visits.  Return in 6 months.

## 2021-05-25 NOTE — Assessment & Plan Note (Signed)
Chronic, ongoing with recent wound, followed by podiatry at this time.  Will keep BP and cholesterol + sugars under good control. Continue aspirin. Consider vascular referral in future.  Labs ordered today.  Return in 6 months.

## 2021-05-26 LAB — COMPREHENSIVE METABOLIC PANEL
ALT: 18 IU/L (ref 0–44)
AST: 19 IU/L (ref 0–40)
Albumin/Globulin Ratio: 1.7 (ref 1.2–2.2)
Albumin: 4.3 g/dL (ref 3.8–4.9)
Alkaline Phosphatase: 63 IU/L (ref 44–121)
BUN/Creatinine Ratio: 26 — ABNORMAL HIGH (ref 9–20)
BUN: 25 mg/dL — ABNORMAL HIGH (ref 6–24)
Bilirubin Total: 0.3 mg/dL (ref 0.0–1.2)
CO2: 21 mmol/L (ref 20–29)
Calcium: 9.2 mg/dL (ref 8.7–10.2)
Chloride: 104 mmol/L (ref 96–106)
Creatinine, Ser: 0.98 mg/dL (ref 0.76–1.27)
Globulin, Total: 2.6 g/dL (ref 1.5–4.5)
Glucose: 136 mg/dL — ABNORMAL HIGH (ref 65–99)
Potassium: 4.5 mmol/L (ref 3.5–5.2)
Sodium: 142 mmol/L (ref 134–144)
Total Protein: 6.9 g/dL (ref 6.0–8.5)
eGFR: 89 mL/min/{1.73_m2} (ref >59)

## 2021-05-26 LAB — HEMOGLOBIN A1C
Est. average glucose Bld gHb Est-mCnc: 123 mg/dL
Hgb A1c MFr Bld: 5.9 % — ABNORMAL HIGH (ref 4.8–5.6)

## 2021-05-26 LAB — VITAMIN B12: Vitamin B-12: 265 pg/mL (ref 232–1245)

## 2021-05-26 NOTE — Progress Notes (Signed)
Hi [name]. Your blood work looks great.  Your A1c is well controlled.  Liver, kidneys and electrolytes look good. Please let me know if you have any questions.   I will see you at our next visit.

## 2021-06-30 ENCOUNTER — Encounter: Payer: BC Managed Care – PPO | Admitting: Nurse Practitioner

## 2021-07-04 DIAGNOSIS — E1142 Type 2 diabetes mellitus with diabetic polyneuropathy: Secondary | ICD-10-CM | POA: Diagnosis not present

## 2021-07-04 DIAGNOSIS — L851 Acquired keratosis [keratoderma] palmaris et plantaris: Secondary | ICD-10-CM | POA: Diagnosis not present

## 2021-07-04 DIAGNOSIS — B351 Tinea unguium: Secondary | ICD-10-CM | POA: Diagnosis not present

## 2021-07-04 DIAGNOSIS — I739 Peripheral vascular disease, unspecified: Secondary | ICD-10-CM | POA: Diagnosis not present

## 2021-07-04 DIAGNOSIS — Z89412 Acquired absence of left great toe: Secondary | ICD-10-CM | POA: Diagnosis not present

## 2021-07-15 ENCOUNTER — Ambulatory Visit (INDEPENDENT_AMBULATORY_CARE_PROVIDER_SITE_OTHER): Payer: BC Managed Care – PPO

## 2021-07-15 ENCOUNTER — Other Ambulatory Visit: Payer: Self-pay

## 2021-07-15 ENCOUNTER — Ambulatory Visit
Admission: EM | Admit: 2021-07-15 | Discharge: 2021-07-15 | Disposition: A | Discharge status: Home / Self Care | Payer: BC Managed Care – PPO | Attending: Physician Assistant | Admitting: Physician Assistant

## 2021-07-15 DIAGNOSIS — L03115 Cellulitis of right lower limb: Secondary | ICD-10-CM | POA: Diagnosis not present

## 2021-07-15 DIAGNOSIS — M79661 Pain in right lower leg: Secondary | ICD-10-CM

## 2021-07-15 DIAGNOSIS — E1169 Type 2 diabetes mellitus with other specified complication: Secondary | ICD-10-CM | POA: Diagnosis not present

## 2021-07-15 DIAGNOSIS — S81801A Unspecified open wound, right lower leg, initial encounter: Secondary | ICD-10-CM | POA: Diagnosis not present

## 2021-07-15 MED ORDER — SULFAMETHOXAZOLE-TRIMETHOPRIM 800-160 MG PO TABS: 1.0000 | ORAL_TABLET | Freq: Two times a day (BID) | ORAL | 0 refills | Status: AC

## 2021-07-15 MED ORDER — CEPHALEXIN 500 MG PO CAPS: 500.0000 mg | ORAL_CAPSULE | Freq: Four times a day (QID) | ORAL | 0 refills | Status: AC

## 2021-07-15 NOTE — Discharge Instructions (Addendum)
Your x-ray does not show any evidence of infection to your bone.  You do appear to have infection of the skin around the ulcer.  Leave the scab in place.  Clean the area with soap and water.  I have sent in 2 different antibiotics to double cover you for infection since you are diabetic.  It is very important that you keep a close eye in this area and follow-up in the ER if you develop any worsening of the redness or swelling or pain or develop a fever or pain when you walk.  Otherwise, please try to make a follow-up appointment with your PCP in the next week.

## 2021-07-15 NOTE — ED Provider Notes (Signed)
[facility]    CSN: [id number] Arrival date & time: 07/15/21  1138      History   Chief Complaint Chief Complaint  Patient presents with   Cellulitis    HPI [name] is a 60 y.o. male presenting for redness of his right lower leg that he noticed 3 days ago.  He states that he first noticed an open wound that is now scabbed over and then the redness spread after.  It is not significantly worsened in the past couple of days.  He says he only has pain whenever he touches the area.  There has been no pustular drainage or bleeding.  He denies any pain on ambulation.  Has not had any fevers.  Patient is concerned since he has a type II diabetic and has had to have toes amputated in the past due to infection and wanted to get started on an antibiotic if he does have a current infection.  He has been cleaning the area and elevating it which is help with swelling.  He has not needed to take any OTC meds for pain relief.  He has no other complaints or concerns.  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) 05/23/2020   Hyperlipidemia associated with type 2 diabetes mellitus (HCC) 02/08/2020   ED (erectile dysfunction) 12/28/2018   Obesity 12/24/2018    Past Surgical History:  Procedure Laterality Date   ADENOIDECTOMY     AMPUTATION Right 10/07/2020   Procedure: AMPUTATION RAY-Right Fifth Ray;  Surgeon: Rosetta Posner, DPM;  Location: [facility];  Service: Podiatry;  Laterality: Right;   AMPUTATION Left  01/07/2021   Procedure: AMPUTATION RAY - Left 1st and 2nd Ray;  Surgeon: Rosetta Posner, DPM;  Location: [facility];  Service: Podiatry;  Laterality: Left;   AMPUTATION TOE     CATARACT EXTRACTION W/PHACO Right 03/03/2020   Procedure: CATARACT EXTRACTION PHACO AND INTRAOCULAR LENS PLACEMENT (IOC) RIGHT DIABETIC VISION BLUE;  Surgeon: Elliot Cousin, MD;  Location: [facility];  Service: Ophthalmology;  Laterality: Right;  COVID ( + ) 12-29-2019  30.97 02:27.7   CATARACT EXTRACTION W/PHACO Left 06/23/2020   Procedure: CATARACT EXTRACTION PHACO AND INTRAOCULAR LENS PLACEMENT (IOC) LEFT DIABETIC VISION BLUE 5.14  00:34.7;  Surgeon: Elliot Cousin, MD;  Location: [facility];  Service: Ophthalmology;  Laterality: Left;  DIABETIC   ELBOW FRACTURE SURGERY Right    EXTERNAL EAR SURGERY     IRRIGATION AND DEBRIDEMENT FOOT Right 10/07/2020   Procedure: IRRIGATION AND DEBRIDEMENT FOOT;  Surgeon: Rosetta Posner, DPM;  Location: [facility];  Service: Podiatry;  Laterality: Right;       Home Medications    Prior to Admission medications   Medication Sig Start Date End Date Taking? Authorizing Provider  Ascorbic Acid (VITAMIN C CR) 1500 MG TBCR Take 1,500 mg by mouth daily.   Yes [provider]  aspirin EC 325 MG tablet Take 325 mg by mouth daily.   Yes [provider]  atorvastatin (LIPITOR) 20 MG tablet Take 1 tablet (20 mg total) by mouth every other day. 02/22/21  Yes Cannady, Jolene T, NP  cephALEXin (KEFLEX) 500 MG capsule Take 1 capsule (500 mg total) by mouth 4 (four) times daily for 7 days. 07/15/21 07/22/21 Yes Shirlee Latch, PA-C  Cinnamon 500 MG capsule Take 500 mg by mouth daily.   Yes [provider]  Garlic 1000 MG CAPS Take 1,000 mg by mouth daily.   Yes [provider]  glipiZIDE (GLUCOTROL) 5 MG tablet Take 1 tablet (5 mg total) by mouth 2 (two) times daily before a meal. 02/22/21  Yes Cannady, Jolene T, NP  glucose blood (ACCU-CHEK GUIDE) test  strip 1 each by Other route daily. 02/17/21  Yes [name], NP  insulin degludec (TRESIBA FLEXTOUCH) 100 UNIT/ML FlexTouch Pen Inject 18 Units into the skin daily. 02/22/21  Yes Cannady, Corrie Dandy T, NP  metFORMIN (GLUCOPHAGE) 1000 MG tablet Take 1 tablet (1,000 mg total) by mouth 2 (two) times daily with a meal. 02/22/21  Yes Cannady, Jolene T, NP  sulfamethoxazole-trimethoprim (BACTRIM DS) 800-160 MG tablet Take 1 tablet by mouth 2 (two) times daily for 7 days. 07/15/21 07/22/21 Yes Shirlee Latch, PA-C    Family History Family History  Problem Relation Age of Onset   Dementia Mother    Hypertension Mother    Cancer Brother        Cancer   Diabetes Maternal Grandmother    Hypertension Maternal Grandmother    COPD Neg Hx    Heart disease Neg Hx    Stroke Neg Hx     Social History Social History   Tobacco Use   Smoking status: Never   Smokeless tobacco: Never  Vaping Use   Vaping Use: Never used  Substance Use Topics   Alcohol use: Not Currently    Comment: occasionally   Drug use: No     Allergies   Patient has no known allergies.   Review of Systems Review of Systems  Constitutional:  Negative for fatigue and fever.  Musculoskeletal:  Positive for arthralgias. Negative for gait problem and joint swelling.  Skin:  Positive for color change and wound.    Physical Exam Triage Vital Signs ED Triage Vitals  Enc Vitals Group     BP 07/15/21 1228 (!) 151/82     Pulse Rate 07/15/21 1228 70     Resp 07/15/21 1228 17     Temp 07/15/21 1228 98.5 F (36.9 C)     Temp Source 07/15/21 1228 Oral     SpO2 07/15/21 1228 100 %     Weight --      Height --      Head Circumference --      Peak Flow --      Pain Score 07/15/21 1229 0     Pain Loc --      Pain Edu? --      Excl. in GC? --    No data found.  Updated Vital Signs BP (!) 151/82 (BP Location: Left Arm)   Pulse 70   Temp 98.5 F (36.9 C) (Oral)   Resp 17   SpO2 100%       Physical Exam Vitals and  nursing note reviewed.  Constitutional:      General: He is not in acute distress.    Appearance: Normal appearance. He is well-developed. He is not ill-appearing.  HENT:     Head: Normocephalic and atraumatic.  Eyes:     General: No scleral icterus.    Conjunctiva/sclera: Conjunctivae normal.  Cardiovascular:     Rate and Rhythm: Normal rate and regular rhythm.     Pulses: Normal pulses.  Pulmonary:     Effort: Pulmonary effort is normal. No respiratory distress.  Musculoskeletal:     Cervical back: Neck supple.  Skin:    General: Skin is warm and dry.     Comments: Right lower leg: See image below.  There is a small scab of the anterior lower leg over the mid tibial region.  Surrounding this is an area of erythema and mild warmth.  Rulon Abide out from that are many small petechiae.  Area is tender to palpation and some of the tenderness is over the tibia.  Neurological:     General: No focal deficit present.     Mental Status: He is alert. Mental status is at baseline.     Motor: No weakness.     Gait: Gait normal.  Psychiatric:        Mood and Affect: Mood normal.        Behavior: Behavior normal.        Thought Content: Thought content normal.      UC Treatments / Results  Labs (all labs ordered are listed, but only abnormal results are displayed) Labs Reviewed - No data to display  EKG   Radiology DG Tibia/Fibula Right  Result Date: 07/15/2021 CLINICAL DATA:  60 year old male with wound and redness along the RIGHT LOWER leg. EXAM: RIGHT TIBIA AND FIBULA - 2 VIEW COMPARISON:  None. FINDINGS: No acute fracture, subluxation or dislocation identified. No radiographic evidence of osteomyelitis noted. No definite soft tissue abnormalities are present. There is no evidence of soft tissue gas. No radiopaque foreign bodies are present. No focal bony lesions are identified. IMPRESSION: No acute abnormality. No evidence of osteomyelitis. Electronically Signed   By: Harmon Pier M.D.    On: 07/15/2021 13:39    Procedures Procedures (including critical care time)  Medications Ordered in UC Medications - No data to display  Initial Impression / Assessment and Plan / UC Course  I have reviewed the triage vital signs and the nursing notes.  Pertinent labs & imaging results that were available during my care of the patient were reviewed by me and considered in my medical decision making (see chart for details).  60 year old male presenting for wound of the right lower leg with surrounding erythema and pain.  He is type II diabetic.  Exam does reveal a scabbed over wound with surrounding erythema.  Area is tender to palpation.  He is afebrile and overall well-appearing.  X-ray of tib-fib obtained today to assess for any deeper infection.  X-ray over read is negative for any acute abnormality.  Reviewed this with patient.  Treating patient cellulitis at this time with Keflex and Bactrim DS since he is diabetic and advised him to monitor the area very closely.  Tylenol for discomfort and make sure to elevate the leg as well.  Advised to go to ED for any worsening symptoms but try to follow-up with PCP in the next week.   Final Clinical Impressions(s) / UC Diagnoses   Final diagnoses:  Cellulitis of right anterior lower leg     Discharge Instructions      Your x-ray does not show any evidence of infection to your bone.  You do appear to have infection of the skin around the ulcer.  Leave the scab  in place.  Clean the area with soap and water.  I have sent in 2 different antibiotics to double cover you for infection since you are diabetic.  It is very important that you keep a close eye in this area and follow-up in the ER if you develop any worsening of the redness or swelling or pain or develop a fever or pain when you walk.  Otherwise, please try to make a follow-up appointment with your PCP in the next week.     ED Prescriptions     Medication Sig Dispense Auth.  Provider   cephALEXin (KEFLEX) 500 MG capsule Take 1 capsule (500 mg total) by mouth 4 (four) times daily for 7 days. 28 capsule Eusebio FriendlyEaves, Saiquan Hands B, PA-C   sulfamethoxazole-trimethoprim (BACTRIM DS) 800-160 MG tablet Take 1 tablet by mouth 2 (two) times daily for 7 days. 14 tablet Gareth MorganEaves, Ciro Tashiro B, PA-C      PDMP not reviewed this encounter.   Shirlee Latchaves, Halbert Jesson B, PA-C 07/15/21 1352

## 2021-07-15 NOTE — ED Triage Notes (Signed)
Pt c/o redness and irritation on right lower leg onset Wednesday. Pt states he thought it was a bug bite that progressively got worse. Pt is diabetic. Pt states it does not hurt until it is touched.

## 2021-07-20 ENCOUNTER — Ambulatory Visit: Payer: BC Managed Care – PPO | Admitting: Nurse Practitioner

## 2021-07-20 ENCOUNTER — Encounter: Payer: Self-pay | Admitting: Nurse Practitioner

## 2021-07-20 ENCOUNTER — Other Ambulatory Visit: Payer: Self-pay

## 2021-07-20 VITALS — BP 136/74 | HR 89 | Temp 98.5°F | Ht 69.53 in | Wt 251.1 lb

## 2021-07-20 DIAGNOSIS — L03115 Cellulitis of right lower limb: Secondary | ICD-10-CM

## 2021-07-20 NOTE — Progress Notes (Signed)
BP 136/74   Pulse 89   Temp 98.5 F (36.9 C)   Ht 5' 9.53" (1.766 m)   Wt 251 lb 2 oz (113.9 kg)   SpO2 96%   BMI 36.52 kg/m    Subjective:    Patient ID: [name], male    DOB: Jun 06, 1961, 61 y.o.   MRN: [id number]  HPI: [name] is a 60 y.o. male  Chief Complaint  Patient presents with   skin infection     Right lower leg, wound is smaller, redness has gotten larger. Seen at [facility] on Saturday, started on 2 different antibiotics   Patient presents to clinic to follow up after being seen in UC for cellulitis.  He was started on Keflex and bactrim.  Patient is continuing to take the antibiotics.  Denies pain, fever, discharge, or swelling   Relevant past medical, surgical, family and social history reviewed and updated as indicated. Interim medical history since our last visit reviewed. Allergies and medications reviewed and updated.  Review of Systems  Constitutional:  Negative for fever.  Cardiovascular:  Negative for leg swelling.  Skin:  Positive for wound.       Denies pain or discharge from wound   Per HPI unless specifically indicated above     Objective:    BP 136/74   Pulse 89   Temp 98.5 F (36.9 C)   Ht 5' 9.53" (1.766 m)   Wt 251 lb 2 oz (113.9 kg)   SpO2 96%   BMI 36.52 kg/m   Wt Readings from Last 3 Encounters:  07/20/21 251 lb 2 oz (113.9 kg)  05/25/21 246 lb 2 oz (111.6 kg)  02/22/21 236 lb 9.6 oz (107.3 kg)    Physical Exam Vitals and nursing note reviewed.  Constitutional:      General: He is not in acute distress.    Appearance: Normal appearance. He is not ill-appearing, toxic-appearing or diaphoretic.  HENT:     Head: Normocephalic.     Right Ear: External ear normal.     Left Ear: External ear normal.     Nose: Nose normal. No congestion or rhinorrhea.     Mouth/Throat:     Mouth: Mucous membranes are moist.  Eyes:     General:        Right eye: No discharge.        Left eye: No discharge.     Extraocular Movements:  Extraocular movements intact.     Conjunctiva/sclera: Conjunctivae normal.     Pupils: Pupils are equal, round, and reactive to light.  Cardiovascular:     Rate and Rhythm: Normal rate and regular rhythm.     Heart sounds: No murmur heard. Pulmonary:     Effort: Pulmonary effort is normal. No respiratory distress.     Breath sounds: Normal breath sounds. No wheezing, rhonchi or rales.  Abdominal:     General: Abdomen is flat. Bowel sounds are normal.  Musculoskeletal:     Cervical back: Normal range of motion and neck supple.  Skin:    General: Skin is warm and dry.     Capillary Refill: Capillary refill takes less than 2 seconds.       Neurological:     General: No focal deficit present.     Mental Status: He is alert and oriented to person, place, and time.  Psychiatric:        Mood and Affect: Mood normal.        Behavior: Behavior  normal.        Thought Content: Thought content normal.        Judgment: Judgment normal.    Results for orders placed or performed in visit on 05/25/21  Comp Met (CMET)  Result Value Ref Range   Glucose 136 (H) 65 - 99 mg/dL   BUN 25 (H) 6 - 24 mg/dL   Creatinine, Ser 0.98 0.76 - 1.27 mg/dL   eGFR 89 >59 mL/min/1.73   BUN/Creatinine Ratio 26 (H) 9 - 20   Sodium 142 134 - 144 mmol/L   Potassium 4.5 3.5 - 5.2 mmol/L   Chloride 104 96 - 106 mmol/L   CO2 21 20 - 29 mmol/L   Calcium 9.2 8.7 - 10.2 mg/dL   Total Protein 6.9 6.0 - 8.5 g/dL   Albumin 4.3 3.8 - 4.9 g/dL   Globulin, Total 2.6 1.5 - 4.5 g/dL   Albumin/Globulin Ratio 1.7 1.2 - 2.2   Bilirubin Total 0.3 0.0 - 1.2 mg/dL   Alkaline Phosphatase 63 44 - 121 IU/L   AST 19 0 - 40 IU/L   ALT 18 0 - 44 IU/L  HgB A1c  Result Value Ref Range   Hgb A1c MFr Bld 5.9 (H) 4.8 - 5.6 %   Est. average glucose Bld gHb Est-mCnc 123 mg/dL  Vitamin B12  Result Value Ref Range   Vitamin B-12 265 232 - 1,245 pg/mL      Assessment & Plan:   Problem List Items Addressed This Visit   None Visit  Diagnoses     Cellulitis of right lower extremity    -  Primary   Complete course of antibiotics given by UC. Recommend a probiotic to help with GI upset. Stay well hydrated. Reviewed S/S to monitor for and when to FU.        Follow up plan: Return if symptoms worsen or fail to improve.   A total of 20 minutes were spent on this encounter today.  When total time is documented, this includes both the face-to-face and non-face-to-face time personally spent before, during and after the visit on the date of the encounter.

## 2021-10-05 DIAGNOSIS — Z89422 Acquired absence of other left toe(s): Secondary | ICD-10-CM | POA: Diagnosis not present

## 2021-10-05 DIAGNOSIS — Z89421 Acquired absence of other right toe(s): Secondary | ICD-10-CM | POA: Diagnosis not present

## 2021-10-05 DIAGNOSIS — B351 Tinea unguium: Secondary | ICD-10-CM | POA: Diagnosis not present

## 2021-10-05 DIAGNOSIS — E1142 Type 2 diabetes mellitus with diabetic polyneuropathy: Secondary | ICD-10-CM | POA: Diagnosis not present

## 2021-11-24 ENCOUNTER — Encounter: Payer: BC Managed Care – PPO | Admitting: Nurse Practitioner

## 2021-11-24 ENCOUNTER — Ambulatory Visit (INDEPENDENT_AMBULATORY_CARE_PROVIDER_SITE_OTHER): Payer: BC Managed Care – PPO | Admitting: Nurse Practitioner

## 2021-11-24 ENCOUNTER — Other Ambulatory Visit: Payer: Self-pay

## 2021-11-24 ENCOUNTER — Encounter: Payer: Self-pay | Admitting: Nurse Practitioner

## 2021-11-24 VITALS — BP 130/80 | HR 83 | Temp 98.4°F | Ht 70.0 in | Wt 256.6 lb

## 2021-11-24 DIAGNOSIS — E785 Hyperlipidemia, unspecified: Secondary | ICD-10-CM | POA: Diagnosis not present

## 2021-11-24 DIAGNOSIS — E1140 Type 2 diabetes mellitus with diabetic neuropathy, unspecified: Secondary | ICD-10-CM

## 2021-11-24 DIAGNOSIS — Z89422 Acquired absence of other left toe(s): Secondary | ICD-10-CM

## 2021-11-24 DIAGNOSIS — Z0000 Encounter for general adult medical examination without abnormal findings: Secondary | ICD-10-CM | POA: Diagnosis not present

## 2021-11-24 DIAGNOSIS — E1169 Type 2 diabetes mellitus with other specified complication: Secondary | ICD-10-CM

## 2021-11-24 DIAGNOSIS — Z794 Long term (current) use of insulin: Secondary | ICD-10-CM | POA: Diagnosis not present

## 2021-11-24 DIAGNOSIS — I739 Peripheral vascular disease, unspecified: Secondary | ICD-10-CM

## 2021-11-24 NOTE — Progress Notes (Signed)
BP 130/80    Pulse 83    Temp 98.4 F (36.9 C) (Oral)    Ht '5\' 10"'  (1.778 m)    Wt 256 lb 9.6 oz (116.4 kg)    SpO2 96%    BMI 36.82 kg/m    Subjective:    Patient ID: [name], male    DOB: [birth year], 60 y.o.   MRN: [id number]  HPI: [name] is a 60 y.o. male presenting on 11/24/2021 for comprehensive medical examination. Current medical complaints include:none  He currently lives with: Interim Problems from his last visit: no  HYPERLIPIDEMIA Hyperlipidemia status: excellent compliance Satisfied with current treatment?  no Side effects:  no Medication compliance: excellent compliance Past cholesterol meds: atorvastain (lipitor) Supplements: none Aspirin:  no The 10-year ASCVD risk score (Arnett DK, et al., 2019) is: 14.4%   Values used to calculate the score:     Age: 76 years     Sex: Male     Is Non-Hispanic African American: No     Diabetic: Yes     Tobacco smoker: No     Systolic Blood Pressure: 542 mmHg     Is BP treated: No     HDL Cholesterol: 32 mg/dL     Total Cholesterol: 146 mg/dL Chest pain:  no Coronary artery disease:  no Family history CAD:  no Family history early CAD:  no  DIABETES Hypoglycemic episodes:no Polydipsia/polyuria: no Visual disturbance: no Chest pain: no Paresthesias: yes Glucose Monitoring: yes  Accucheck frequency:  every other day  Fasting glucose: 140  Post prandial:  Evening:  Before meals: Taking Insulin?: yes  Long acting insulin: Tresiba 18u  Short acting insulin: Blood Pressure Monitoring: not checking Retinal Examination: Up to Date Foot Exam: Up to Date Diabetic Education: Not Completed Pneumovax: Up to Date Influenza: Not up to Date Aspirin: yes   Depression Screen done today and results listed below:  Depression screen [facility] 2/9 11/24/2021 05/25/2021 05/23/2020 12/24/2018 08/14/2017  Decreased Interest 0 0 0 0 0  Down, Depressed, Hopeless 0 0 0 0 0  PHQ - 2 Score 0 0 0 0 0  Altered sleeping 0 - 0 0 -   Tired, decreased energy 0 - 0 0 -  Change in appetite 0 - 0 0 -  Feeling bad or failure about yourself  0 - 0 0 -  Trouble concentrating 0 - 0 0 -  Moving slowly or fidgety/restless 0 - 0 0 -  Suicidal thoughts 0 - 0 0 -  PHQ-9 Score 0 - 0 0 -  Difficult doing work/chores Not difficult at all - - - -    The patient does not have a history of falls. I did complete a risk assessment for falls. A plan of care for falls was documented.   Past Medical History:  Past Medical History:  Diagnosis Date   Arthritis    hands   COVID-19 12/29/2019   Diabetes mellitus without complication ([place])    type 2   GERD (gastroesophageal reflux disease)    Hypercholesterolemia    Osteomyelitis of left foot [facility])     Surgical History:  Past Surgical History:  Procedure Laterality Date   ADENOIDECTOMY     AMPUTATION Right 10/07/2020   Procedure: AMPUTATION RAY-Right [place];  Surgeon: Caroline More, DPM;  Location: [facility];  Service: Podiatry;  Laterality: Right;   AMPUTATION Left 01/07/2021   Procedure: AMPUTATION RAY - Left 1st and 2nd Ray;  Surgeon: Caroline More, DPM;  Location: [facility];  Service: Podiatry;  Laterality: Left;   AMPUTATION TOE     CATARACT EXTRACTION W/PHACO Right 03/03/2020   Procedure: CATARACT EXTRACTION PHACO AND INTRAOCULAR LENS PLACEMENT ([place]) RIGHT DIABETIC VISION BLUE;  Surgeon: Marchia Meiers, MD;  Location: [place];  Service: Ophthalmology;  Laterality: Right;  COVID ( + ) 12-29-2019  30.97 02:27.7   CATARACT EXTRACTION W/PHACO Left 06/23/2020   Procedure: CATARACT EXTRACTION PHACO AND INTRAOCULAR LENS PLACEMENT ([facility]) LEFT DIABETIC VISION BLUE 5.14  00:34.7;  Surgeon: Marchia Meiers, MD;  Location: [place];  Service: Ophthalmology;  Laterality: Left;  DIABETIC   ELBOW FRACTURE SURGERY Right    EXTERNAL EAR SURGERY     IRRIGATION AND DEBRIDEMENT FOOT Right 10/07/2020   Procedure: IRRIGATION AND DEBRIDEMENT FOOT;  Surgeon: Caroline More, DPM;   Location: [facility];  Service: Podiatry;  Laterality: Right;    Medications:  Current Outpatient Medications on File Prior to Visit  Medication Sig   Ascorbic Acid (VITAMIN C CR) 1500 MG TBCR Take 1,500 mg by mouth daily.   aspirin EC 325 MG tablet Take 325 mg by mouth daily.   atorvastatin (LIPITOR) 20 MG tablet Take 1 tablet (20 mg total) by mouth every other day.   BD PEN NEEDLE MICRO U/F 32G X 6 MM MISC SMARTSIG:1 SUB-Q Daily PRN   Cinnamon 500 MG capsule Take 500 mg by mouth daily.   Garlic 1884 MG CAPS Take 1,000 mg by mouth daily.   glipiZIDE (GLUCOTROL) 5 MG tablet Take 1 tablet (5 mg total) by mouth 2 (two) times daily before a meal.   glucose blood (ACCU-CHEK GUIDE) test strip 1 each by Other route daily.   insulin degludec (TRESIBA FLEXTOUCH) 100 UNIT/ML FlexTouch Pen Inject 18 Units into the skin daily.   metFORMIN (GLUCOPHAGE) 1000 MG tablet Take 1 tablet (1,000 mg total) by mouth 2 (two) times daily with a meal.   No current facility-administered medications on file prior to visit.    Allergies:  No Known Allergies  Social History:  Social History   Socioeconomic History   Marital status: Single    Spouse name: Not on file   Number of children: Not on file   Years of education: Not on file   Highest education level: Not on file  Occupational History   Not on file  Tobacco Use   Smoking status: Never   Smokeless tobacco: Never  Vaping Use   Vaping Use: Never used  Substance and Sexual Activity   Alcohol use: Not Currently    Comment: occasionally   Drug use: No   Sexual activity: Not Currently  Other Topics Concern   Not on file  Social History Narrative   Not on file   Social Determinants of Health   Financial Resource Strain: Not on file  Food Insecurity: Not on file  Transportation Needs: Not on file  Physical Activity: Not on file  Stress: Not on file  Social Connections: Not on file  Intimate Partner Violence: Not on file   Social History    Tobacco Use  Smoking Status Never  Smokeless Tobacco Never   Social History   Substance and Sexual Activity  Alcohol Use Not Currently   Comment: occasionally    Family History:  Family History  Problem Relation Age of Onset   Dementia Mother    Hypertension Mother    Cancer Brother        Cancer   Diabetes Maternal Grandmother    Hypertension  Maternal Grandmother    COPD Neg Hx    Heart disease Neg Hx    Stroke Neg Hx     Past medical history, surgical history, medications, allergies, family history and social history reviewed with patient today and changes made to appropriate areas of the chart.   Review of Systems  HENT:         Denies vision changes.  Cardiovascular:  Positive for leg swelling.  Neurological:  Negative for tingling.  Endo/Heme/Allergies:  Negative for polydipsia.       Denies Polyuria  All other ROS negative except what is listed above and in the HPI.      Objective:    BP 130/80    Pulse 83    Temp 98.4 F (36.9 C) (Oral)    Ht '5\' 10"'  (1.778 m)    Wt 256 lb 9.6 oz (116.4 kg)    SpO2 96%    BMI 36.82 kg/m   Wt Readings from Last 3 Encounters:  11/24/21 256 lb 9.6 oz (116.4 kg)  07/20/21 251 lb 2 oz (113.9 kg)  05/25/21 246 lb 2 oz (111.6 kg)    Physical Exam Vitals and nursing note reviewed.  Constitutional:      General: He is not in acute distress.    Appearance: Normal appearance. He is not ill-appearing, toxic-appearing or diaphoretic.  HENT:     Head: Normocephalic.     Right Ear: Tympanic membrane, ear canal and external ear normal.     Left Ear: Tympanic membrane, ear canal and external ear normal.     Nose: Nose normal. No congestion or rhinorrhea.     Mouth/Throat:     Mouth: Mucous membranes are moist.  Eyes:     General:        Right eye: No discharge.        Left eye: No discharge.     Extraocular Movements: Extraocular movements intact.     Conjunctiva/sclera: Conjunctivae normal.     Pupils: Pupils are equal,  round, and reactive to light.  Cardiovascular:     Rate and Rhythm: Normal rate and regular rhythm.     Heart sounds: No murmur heard. Pulmonary:     Effort: Pulmonary effort is normal. No respiratory distress.     Breath sounds: Normal breath sounds. No wheezing, rhonchi or rales.  Abdominal:     General: Abdomen is flat. Bowel sounds are normal. There is no distension.     Palpations: Abdomen is soft.     Tenderness: There is no abdominal tenderness. There is no guarding.  Musculoskeletal:     Cervical back: Normal range of motion and neck supple.     Right lower leg: No edema.     Left lower leg: No edema.  Skin:    General: Skin is warm and dry.     Capillary Refill: Capillary refill takes less than 2 seconds.  Neurological:     General: No focal deficit present.     Mental Status: He is alert and oriented to person, place, and time.     Cranial Nerves: No cranial nerve deficit.     Motor: No weakness.     Deep Tendon Reflexes: Reflexes normal.  Psychiatric:        Mood and Affect: Mood normal.        Behavior: Behavior normal.        Thought Content: Thought content normal.        Judgment: Judgment normal.  Results for orders placed or performed in visit on 05/25/21  Comp Met (CMET)  Result Value Ref Range   Glucose 136 (H) 65 - 99 mg/dL   BUN 25 (H) 6 - 24 mg/dL   Creatinine, Ser 0.98 0.76 - 1.27 mg/dL   eGFR 89 >59 mL/min/1.73   BUN/Creatinine Ratio 26 (H) 9 - 20   Sodium 142 134 - 144 mmol/L   Potassium 4.5 3.5 - 5.2 mmol/L   Chloride 104 96 - 106 mmol/L   CO2 21 20 - 29 mmol/L   Calcium 9.2 8.7 - 10.2 mg/dL   Total Protein 6.9 6.0 - 8.5 g/dL   Albumin 4.3 3.8 - 4.9 g/dL   Globulin, Total 2.6 1.5 - 4.5 g/dL   Albumin/Globulin Ratio 1.7 1.2 - 2.2   Bilirubin Total 0.3 0.0 - 1.2 mg/dL   Alkaline Phosphatase 63 44 - 121 IU/L   AST 19 0 - 40 IU/L   ALT 18 0 - 44 IU/L  HgB A1c  Result Value Ref Range   Hgb A1c MFr Bld 5.9 (H) 4.8 - 5.6 %   Est. average  glucose Bld gHb Est-mCnc 123 mg/dL  Vitamin B12  Result Value Ref Range   Vitamin B-12 265 232 - 1,245 pg/mL      Assessment & Plan:   Problem List Items Addressed This Visit       Cardiovascular and Mediastinum   PAD (peripheral artery disease) (HCC)    Chronic, followed by podiatry at this time.  Will keep BP and cholesterol + sugars under good control. Continue aspirin. Consider vascular referral in future.  Labs ordered today.  Return in 6 months.  Patient does have intermittent swelling after being on his legs all day.  His symptoms do resolve at night with elevation.        Endocrine   Hyperlipidemia associated with type 2 diabetes mellitus (HCC)    Chronic.  Controlled.  Continue with current medication regimen of Atorvastatin 68m daily.  Labs ordered today.  Return to clinic in 6 months for reevaluation.  Call sooner if concerns arise.        Relevant Orders   Lipid panel   Type 2 diabetes mellitus with diabetic neuropathy, with long-term current use of insulin (HCC)    Chronic, ongoing  Last A1c 5.9%.  Labs ordered today.  Discussed with patient that we can consider coming off [place]  He would like to wait until 6 months follow up to redicuss. Will Discuss ACE/ARB at next visit. Does not check blood sugars regularly.   Return in 6 months.      Relevant Orders   HgB A1c     Other   Status post amputation of toe of left foot ([place]    Followed by Podiatry.  Continue to follow their recommendations.      Other Visit Diagnoses     Annual physical exam    -  Primary   Health maintenance reviewed during visit. Labs ordered today. Declined vaccines.    Relevant Orders   TSH   PSA   Lipid panel   CBC with Differential/Platelet   Comprehensive metabolic panel   Urinalysis, Routine w reflex microscopic        Discussed aspirin prophylaxis for myocardial infarction prevention and decision was made to continue ASA  LABORATORY TESTING:  Health maintenance labs  ordered today as discussed above.   The natural history of prostate cancer and ongoing controversy regarding screening and potential treatment outcomes  of prostate cancer has been discussed with the patient. The meaning of a false positive PSA and a false negative PSA has been discussed. He indicates understanding of the limitations of this screening test and wishes to proceed with screening PSA testing.   IMMUNIZATIONS:   - Tdap: Tetanus vaccination status reviewed: last tetanus booster within 10 years. - Influenza: Refused - Pneumovax: Up to date - Prevnar: Not applicable - COVID: Refused - HPV: Not applicable - Shingrix vaccine: Refused  SCREENING: - Colonoscopy: Up to date  Discussed with patient purpose of the colonoscopy is to detect colon cancer at curable precancerous or early stages   - AAA Screening: Not applicable  -Hearing Test: Not applicable  -Spirometry: Not applicable   PATIENT COUNSELING:    Sexuality: Discussed sexually transmitted diseases, partner selection, use of condoms, avoidance of unintended pregnancy  and contraceptive alternatives.   Advised to avoid cigarette smoking.  I discussed with the patient that most people either abstain from alcohol or drink within safe limits (<=14/week and <=4 drinks/occasion for males, <=7/weeks and <= 3 drinks/occasion for females) and that the risk for alcohol disorders and other health effects rises proportionally with the number of drinks per week and how often a drinker exceeds daily limits.  Discussed cessation/primary prevention of drug use and availability of treatment for abuse.   Diet: Encouraged to adjust caloric intake to maintain  or achieve ideal body weight, to reduce intake of dietary saturated fat and total fat, to limit sodium intake by avoiding high sodium foods and not adding table salt, and to maintain adequate dietary potassium and calcium preferably from fresh fruits, vegetables, and low-fat dairy  products.    stressed the importance of regular exercise  Injury prevention: Discussed safety belts, safety helmets, smoke detector, smoking near bedding or upholstery.   Dental health: Discussed importance of regular tooth brushing, flossing, and dental visits.   Follow up plan: NEXT PREVENTATIVE PHYSICAL DUE IN 1 YEAR. Return in about 6 months (around 05/25/2022) for HTN, HLD, DM2 FU.

## 2021-11-24 NOTE — Assessment & Plan Note (Signed)
Chronic, followed by podiatry at this time.  Will keep BP and cholesterol + sugars under good control. Continue aspirin. Consider vascular referral in future.  Labs ordered today.  Return in 6 months.  Patient does have intermittent swelling after being on his legs all day.  His symptoms do resolve at night with elevation.

## 2021-11-24 NOTE — Assessment & Plan Note (Signed)
Chronic.  Controlled.  Continue with current medication regimen of Atorvastatin 20mg daily.  Labs ordered today.  Return to clinic in 6 months for reevaluation.  Call sooner if concerns arise.   

## 2021-11-24 NOTE — Assessment & Plan Note (Signed)
Followed by Podiatry.  Continue to follow their recommendations.

## 2021-11-24 NOTE — Assessment & Plan Note (Signed)
Chronic, ongoing  Last A1c 5.9%.  Labs ordered today.  Discussed with patient that we can consider coming off [place].  He would like to wait until 6 months follow up to redicuss. Will Discuss ACE/ARB at next visit. Does not check blood sugars regularly.   Return in 6 months.

## 2021-11-25 LAB — URINALYSIS, ROUTINE W REFLEX MICROSCOPIC
Bilirubin, UA: NEGATIVE
Glucose, UA: NEGATIVE
Ketones, UA: NEGATIVE
Leukocytes,UA: NEGATIVE
Nitrite, UA: NEGATIVE
Specific Gravity, UA: 1.024 (ref 1.005–1.030)
Urobilinogen, Ur: 0.2 mg/dL (ref 0.2–1.0)
pH, UA: 5 (ref 5.0–7.5)

## 2021-11-25 LAB — CBC WITH DIFFERENTIAL/PLATELET
Basophils Absolute: 0 10*3/uL (ref 0.0–0.2)
Basos: 0 %
EOS (ABSOLUTE): 0.2 10*3/uL (ref 0.0–0.4)
Eos: 3 %
Hematocrit: 42.3 % (ref 37.5–51.0)
Hemoglobin: 14.8 g/dL (ref 13.0–17.7)
Immature Grans (Abs): 0.1 10*3/uL (ref 0.0–0.1)
Immature Granulocytes: 1 %
Lymphocytes Absolute: 2.1 10*3/uL (ref 0.7–3.1)
Lymphs: 27 %
MCH: 32 pg (ref 26.6–33.0)
MCHC: 35 g/dL (ref 31.5–35.7)
MCV: 92 fL (ref 79–97)
Monocytes Absolute: 0.5 10*3/uL (ref 0.1–0.9)
Monocytes: 7 %
Neutrophils Absolute: 4.6 10*3/uL (ref 1.4–7.0)
Neutrophils: 62 %
Platelets: 219 10*3/uL (ref 150–450)
RBC: 4.62 x10E6/uL (ref 4.14–5.80)
RDW: 12.7 % (ref 11.6–15.4)
WBC: 7.5 10*3/uL (ref 3.4–10.8)

## 2021-11-25 LAB — LIPID PANEL
Chol/HDL Ratio: 5.4 ratio — ABNORMAL HIGH (ref 0.0–5.0)
Cholesterol, Total: 185 mg/dL (ref 100–199)
HDL: 34 mg/dL — ABNORMAL LOW (ref >39)
LDL Chol Calc (NIH): 114 mg/dL — ABNORMAL HIGH (ref 0–99)
Triglycerides: 209 mg/dL — ABNORMAL HIGH (ref 0–149)
VLDL Cholesterol Cal: 37 mg/dL (ref 5–40)

## 2021-11-25 LAB — COMPREHENSIVE METABOLIC PANEL
ALT: 25 IU/L (ref 0–44)
AST: 21 IU/L (ref 0–40)
Albumin/Globulin Ratio: 2 (ref 1.2–2.2)
Albumin: 4.5 g/dL (ref 3.8–4.9)
Alkaline Phosphatase: 61 IU/L (ref 44–121)
BUN/Creatinine Ratio: 21 — ABNORMAL HIGH (ref 9–20)
BUN: 21 mg/dL (ref 6–24)
Bilirubin Total: 0.5 mg/dL (ref 0.0–1.2)
CO2: 24 mmol/L (ref 20–29)
Calcium: 9.6 mg/dL (ref 8.7–10.2)
Chloride: 103 mmol/L (ref 96–106)
Creatinine, Ser: 0.98 mg/dL (ref 0.76–1.27)
Globulin, Total: 2.3 g/dL (ref 1.5–4.5)
Glucose: 78 mg/dL (ref 70–99)
Potassium: 4.6 mmol/L (ref 3.5–5.2)
Sodium: 143 mmol/L (ref 134–144)
Total Protein: 6.8 g/dL (ref 6.0–8.5)
eGFR: 89 mL/min/{1.73_m2} (ref >59)

## 2021-11-25 LAB — MICROSCOPIC EXAMINATION
Bacteria, UA: NONE SEEN
Casts: NONE SEEN /lpf
RBC, Urine: NONE SEEN /hpf (ref 0–2)

## 2021-11-25 LAB — HEMOGLOBIN A1C
Est. average glucose Bld gHb Est-mCnc: 157 mg/dL
Hgb A1c MFr Bld: 7.1 % — ABNORMAL HIGH (ref 4.8–5.6)

## 2021-11-25 LAB — PSA: Prostate Specific Ag, Serum: 3.4 ng/mL (ref 0.0–4.0)

## 2021-11-25 LAB — TSH: TSH: 1.54 u[IU]/mL (ref 0.450–4.500)

## 2021-11-27 NOTE — Progress Notes (Signed)
Hi Mr. [name]. It was good to see you last week.  Your lab work shows that your cholesterol has improved from prior.  Continue with the good diet changes.  Keep up the good work.  A1c increased to 7.1.  Continue with current medication regimen.  Decrease carbohydrate intake.  We will adjust medications if necessary at next visit. Other lab work looks good. Please let me know if you have any questions. I will see you at our next visit.

## 2021-12-04 ENCOUNTER — Other Ambulatory Visit: Payer: Self-pay | Admitting: Family Medicine

## 2021-12-05 NOTE — Telephone Encounter (Signed)
Requested Prescriptions  Pending Prescriptions Disp Refills   BD PEN NEEDLE MICRO U/F 32G X 6 MM MISC [Pharmacy Med Name: BD [id number]] 100 each 0    Sig: USE 1  ONCE DAILY AS NEEDED     Endocrinology: Diabetes - Testing Supplies Passed - 12/04/2021 10:08 AM      Passed - Valid encounter within last 12 months    Recent Outpatient Visits          1 week ago Annual physical exam   [facility] [name], NP   4 months ago Cellulitis of right lower extremity   [facility] [name], NP   6 months ago Type 2 diabetes mellitus with diabetic neuropathy, with long-term current use of insulin (HCC)   [facility] [name], NP   9 months ago Status post amputation of toe of left foot (HCC)   [name] [place], [place] T, NP   1 year ago Type 2 diabetes mellitus with diabetic neuropathy, with long-term current use of insulin (HCC)   [name] [place], Oralia Rud, DO      Future Appointments            In 5 months [name], NP [facility]

## 2022-01-09 DIAGNOSIS — B351 Tinea unguium: Secondary | ICD-10-CM | POA: Diagnosis not present

## 2022-01-09 DIAGNOSIS — L851 Acquired keratosis [keratoderma] palmaris et plantaris: Secondary | ICD-10-CM | POA: Diagnosis not present

## 2022-01-09 DIAGNOSIS — E1142 Type 2 diabetes mellitus with diabetic polyneuropathy: Secondary | ICD-10-CM | POA: Diagnosis not present

## 2022-02-23 DIAGNOSIS — M86172 Other acute osteomyelitis, left ankle and foot: Secondary | ICD-10-CM | POA: Diagnosis not present

## 2022-02-23 DIAGNOSIS — M216X2 Other acquired deformities of left foot: Secondary | ICD-10-CM | POA: Diagnosis not present

## 2022-02-23 DIAGNOSIS — E1142 Type 2 diabetes mellitus with diabetic polyneuropathy: Secondary | ICD-10-CM | POA: Diagnosis not present

## 2022-02-23 DIAGNOSIS — M216X1 Other acquired deformities of right foot: Secondary | ICD-10-CM | POA: Diagnosis not present

## 2022-02-23 DIAGNOSIS — L97524 Non-pressure chronic ulcer of other part of left foot with necrosis of bone: Secondary | ICD-10-CM | POA: Diagnosis not present

## 2022-02-23 DIAGNOSIS — Z89422 Acquired absence of other left toe(s): Secondary | ICD-10-CM | POA: Diagnosis not present

## 2022-02-26 ENCOUNTER — Other Ambulatory Visit: Payer: Self-pay | Admitting: Podiatry

## 2022-02-26 ENCOUNTER — Telehealth: Payer: Self-pay

## 2022-02-26 NOTE — Progress Notes (Signed)
Pt cancelled surger ?

## 2022-02-26 NOTE — Telephone Encounter (Signed)
Received surgical clearance form for the patient. Please call and schedule surgery clearance appointment.  ? ?Form placed in bin until appointment.  ?

## 2022-02-27 ENCOUNTER — Other Ambulatory Visit: Payer: BC Managed Care – PPO

## 2022-02-27 NOTE — Telephone Encounter (Signed)
Pt states he did not approve this surgery yet. I advised pt to call back and schedule an appt when he wants to go forward with the surgery ?

## 2022-03-02 ENCOUNTER — Ambulatory Visit: Admit: 2022-03-02 | Payer: BC Managed Care – PPO | Admitting: Podiatry

## 2022-03-02 SURGERY — AMPUTATION, FOOT, RAY
Anesthesia: Choice | Laterality: Left

## 2022-03-29 NOTE — Progress Notes (Signed)
? ?BP 134/80   Pulse 82   Temp 98.2 ?F (36.8 ?C) (Oral)   Wt 245 lb 6.4 oz (111.3 kg)   SpO2 98%   BMI 35.21 kg/m?   ? ?Subjective:  ? ? Patient ID: [name], male    DOB: 19-Apr-1961, 61 y.o.   MRN: [id number] ? ?HPI: ?[name] is a 61 y.o. male ? ?Chief Complaint  ?Patient presents with  ? Foot Problem  ?  Sore on bottom of foot onset about 3 weeks ago. Worsening per pt, c/o draining.   ? ?DIABETIC FOOT ULCER ?Duration: weeks (3) ?Involved foot: left ?Mechanism of injury: unknown ?Location:  ?Onset: sudden  ?Severity: moderate  ?Quality:   no pain ?Status: worse- started draining this week ?Treatments attempted:  Bactrim   ?Relief with NSAIDs?:  No NSAIDs Taken ?Weakness with weight bearing or walking: no ?Fevers:no ?Has already been treated with bactrim which improved the symptoms.  Then Wednesday the drainage returned.  ? ?DIABETES ?Hypoglycemic episodes:no ?Polydipsia/polyuria: no ?Visual disturbance: no ?Chest pain: no ?Paresthesias: no ?Glucose Monitoring: yes ? Accucheck frequency: Daily ? Fasting glucose: 120-150 ? Post prandial: ? Evening: ? Before meals: ?Taking Insulin?: no stopped [place] about 3 months ago.  ? Long acting insulin: ? Short acting insulin: ? ? ? ?Relevant past medical, surgical, family and social history reviewed and updated as indicated. Interim medical history since our last visit reviewed. ?Allergies and medications reviewed and updated. ? ?Review of Systems  ?Skin:   ?     Diabetic ulcer  ? ?Per HPI unless specifically indicated above ? ?   ?Objective:  ?  ?BP 134/80   Pulse 82   Temp 98.2 ?F (36.8 ?C) (Oral)   Wt 245 lb 6.4 oz (111.3 kg)   SpO2 98%   BMI 35.21 kg/m?   ?Wt Readings from Last 3 Encounters:  ?03/30/22 245 lb 6.4 oz (111.3 kg)  ?11/24/21 256 lb 9.6 oz (116.4 kg)  ?07/20/21 251 lb 2 oz (113.9 kg)  ?  ?Physical Exam ?Vitals and nursing note reviewed.  ?Constitutional:   ?   General: He is not in acute distress. ?   Appearance: Normal appearance. He is  not ill-appearing, toxic-appearing or diaphoretic.  ?HENT:  ?   Head: Normocephalic.  ?   Right Ear: External ear normal.  ?   Left Ear: External ear normal.  ?   Nose: Nose normal. No congestion or rhinorrhea.  ?   Mouth/Throat:  ?   Mouth: Mucous membranes are moist.  ?Eyes:  ?   General:     ?   Right eye: No discharge.     ?   Left eye: No discharge.  ?   Extraocular Movements: Extraocular movements intact.  ?   Conjunctiva/sclera: Conjunctivae normal.  ?   Pupils: Pupils are equal, round, and reactive to light.  ?Cardiovascular:  ?   Rate and Rhythm: Normal rate and regular rhythm.  ?   Heart sounds: No murmur heard. ?Pulmonary:  ?   Effort: Pulmonary effort is normal. No respiratory distress.  ?   Breath sounds: Normal breath sounds. No wheezing, rhonchi or rales.  ?Abdominal:  ?   General: Abdomen is flat. Bowel sounds are normal.  ?Musculoskeletal:  ?   Cervical back: Normal range of motion and neck supple.  ?     Feet: ? ?Skin: ?   General: Skin is warm and dry.  ?   Capillary Refill: Capillary refill takes less than 2 seconds.  ?  Neurological:  ?   General: No focal deficit present.  ?   Mental Status: He is alert and oriented to person, place, and time.  ?Psychiatric:     ?   Mood and Affect: Mood normal.     ?   Behavior: Behavior normal.     ?   Thought Content: Thought content normal.     ?   Judgment: Judgment normal.  ? ? ?Results for orders placed or performed in visit on 11/24/21  ?Microscopic Examination  ? Urine  ?Result Value Ref Range  ? WBC, UA 0-5 0 - 5 /hpf  ? RBC None seen 0 - 2 /hpf  ? Epithelial Cells (non renal) 0-10 0 - 10 /hpf  ? Casts None seen None seen /lpf  ? Bacteria, UA None seen None seen/Few  ?TSH  ?Result Value Ref Range  ? TSH 1.540 0.450 - 4.500 uIU/mL  ?PSA  ?Result Value Ref Range  ? Prostate Specific Ag, Serum 3.4 0.0 - 4.0 ng/mL  ?Lipid panel  ?Result Value Ref Range  ? Cholesterol, Total 185 100 - 199 mg/dL  ? Triglycerides 209 (H) 0 - 149 mg/dL  ? HDL 34 (L) >39 mg/dL   ? VLDL Cholesterol Cal 37 5 - 40 mg/dL  ? LDL Chol Calc (NIH) 114 (H) 0 - 99 mg/dL  ? Chol/HDL Ratio 5.4 (H) 0.0 - 5.0 ratio  ?CBC with Differential/Platelet  ?Result Value Ref Range  ? WBC 7.5 3.4 - 10.8 x10E3/uL  ? RBC 4.62 4.14 - 5.80 x10E6/uL  ? Hemoglobin 14.8 13.0 - 17.7 g/dL  ? Hematocrit 42.3 37.5 - 51.0 %  ? MCV 92 79 - 97 fL  ? MCH 32.0 26.6 - 33.0 pg  ? MCHC 35.0 31.5 - 35.7 g/dL  ? RDW 12.7 11.6 - 15.4 %  ? Platelets 219 150 - 450 x10E3/uL  ? Neutrophils 62 Not Estab. %  ? Lymphs 27 Not Estab. %  ? Monocytes 7 Not Estab. %  ? Eos 3 Not Estab. %  ? Basos 0 Not Estab. %  ? Neutrophils Absolute 4.6 1.4 - 7.0 x10E3/uL  ? Lymphocytes Absolute 2.1 0.7 - 3.1 x10E3/uL  ? Monocytes Absolute 0.5 0.1 - 0.9 x10E3/uL  ? EOS (ABSOLUTE) 0.2 0.0 - 0.4 x10E3/uL  ? Basophils Absolute 0.0 0.0 - 0.2 x10E3/uL  ? Immature Granulocytes 1 Not Estab. %  ? Immature Grans (Abs) 0.1 0.0 - 0.1 x10E3/uL  ?Comprehensive metabolic panel  ?Result Value Ref Range  ? Glucose 78 70 - 99 mg/dL  ? BUN 21 6 - 24 mg/dL  ? Creatinine, Ser 0.98 0.76 - 1.27 mg/dL  ? eGFR 89 >59 mL/min/1.73  ? BUN/Creatinine Ratio 21 (H) 9 - 20  ? Sodium 143 134 - 144 mmol/L  ? Potassium 4.6 3.5 - 5.2 mmol/L  ? Chloride 103 96 - 106 mmol/L  ? CO2 24 20 - 29 mmol/L  ? Calcium 9.6 8.7 - 10.2 mg/dL  ? Total Protein 6.8 6.0 - 8.5 g/dL  ? Albumin 4.5 3.8 - 4.9 g/dL  ? Globulin, Total 2.3 1.5 - 4.5 g/dL  ? Albumin/Globulin Ratio 2.0 1.2 - 2.2  ? Bilirubin Total 0.5 0.0 - 1.2 mg/dL  ? Alkaline Phosphatase 61 44 - 121 IU/L  ? AST 21 0 - 40 IU/L  ? ALT 25 0 - 44 IU/L  ?Urinalysis, Routine w reflex microscopic  ?Result Value Ref Range  ? Specific Gravity, UA 1.024 1.005 - 1.030  ? pH, UA  5.0 5.0 - 7.5  ? Color, UA Yellow Yellow  ? Appearance Ur Clear Clear  ? Leukocytes,UA Negative Negative  ? Protein,UA 3+ (A) Negative/Trace  ? Glucose, UA Negative Negative  ? Ketones, UA Negative Negative  ? RBC, UA 1+ (A) Negative  ? Bilirubin, UA Negative Negative  ? Urobilinogen, Ur  0.2 0.2 - 1.0 mg/dL  ? Nitrite, UA Negative Negative  ? Microscopic Examination See below:   ?HgB A1c  ?Result Value Ref Range  ? Hgb A1c MFr Bld 7.1 (H) 4.8 - 5.6 %  ? Est. average glucose Bld gHb Est-mCnc 157 mg/dL  ? ?   ?Assessment & Plan:  ? ?Problem List Items Addressed This Visit   ? ?  ? Endocrine  ? Type 2 diabetes mellitus with diabetic neuropathy, with long-term current use of insulin ([place]) - Primary  ?  Chronic. Ongoing. Will check A1c at visit today.  Stopped [place] about 3 months ago.  Sugars are running 120-150s.  Goal is to continue with out [place]. Can start GLP1 or SGLT2 if another medication is needed. Will make recommendations based on lab results.  ? ?  ?  ? Relevant Orders  ? HgB A1c  ? Comp Met (CMET)  ? ?Other Visit Diagnoses   ? ? Diabetic ulcer of left foot associated with type 2 diabetes mellitus, limited to breakdown of skin, unspecified part of foot ([place])      ? Will treat with Keflex. Has already been treated with Bactrim. Will follow up in 2 weeks to ensure healing. If not healed will send to wound clinic.   ? Relevant Orders  ? CBC w/Diff  ? ?  ?  ? ?Follow up plan: ?Return in about 2 weeks (around 04/13/2022) for Foot Ulcer. ? ? ? ? ? ?

## 2022-03-30 ENCOUNTER — Encounter: Payer: Self-pay | Admitting: Nurse Practitioner

## 2022-03-30 ENCOUNTER — Ambulatory Visit (INDEPENDENT_AMBULATORY_CARE_PROVIDER_SITE_OTHER): Payer: BC Managed Care – PPO | Admitting: Nurse Practitioner

## 2022-03-30 VITALS — BP 134/80 | HR 82 | Temp 98.2°F | Wt 245.4 lb

## 2022-03-30 DIAGNOSIS — D649 Anemia, unspecified: Secondary | ICD-10-CM

## 2022-03-30 DIAGNOSIS — L97521 Non-pressure chronic ulcer of other part of left foot limited to breakdown of skin: Secondary | ICD-10-CM | POA: Diagnosis not present

## 2022-03-30 DIAGNOSIS — E1140 Type 2 diabetes mellitus with diabetic neuropathy, unspecified: Secondary | ICD-10-CM

## 2022-03-30 DIAGNOSIS — Z794 Long term (current) use of insulin: Secondary | ICD-10-CM | POA: Diagnosis not present

## 2022-03-30 DIAGNOSIS — E11621 Type 2 diabetes mellitus with foot ulcer: Secondary | ICD-10-CM | POA: Diagnosis not present

## 2022-03-30 MED ORDER — CEPHALEXIN 500 MG PO CAPS: 500.0000 mg | ORAL_CAPSULE | Freq: Four times a day (QID) | ORAL | 0 refills | Status: AC

## 2022-03-30 NOTE — Assessment & Plan Note (Signed)
Chronic. Ongoing. Will check A1c at visit today.  Stopped [place] about 3 months ago.  Sugars are running 120-150s.  Goal is to continue with out [place]. Can start GLP1 or SGLT2 if another medication is needed. Will make recommendations based on lab results.  ?

## 2022-03-31 LAB — HEMOGLOBIN A1C
Est. average glucose Bld gHb Est-mCnc: 134 mg/dL
Hgb A1c MFr Bld: 6.3 % — ABNORMAL HIGH (ref 4.8–5.6)

## 2022-03-31 LAB — CBC WITH DIFFERENTIAL/PLATELET
Basophils Absolute: 0 10*3/uL (ref 0.0–0.2)
Basos: 1 %
EOS (ABSOLUTE): 0.2 10*3/uL (ref 0.0–0.4)
Eos: 2 %
Hematocrit: 36.5 % — ABNORMAL LOW (ref 37.5–51.0)
Hemoglobin: 12.2 g/dL — ABNORMAL LOW (ref 13.0–17.7)
Immature Grans (Abs): 0.1 10*3/uL (ref 0.0–0.1)
Immature Granulocytes: 1 %
Lymphocytes Absolute: 1.7 10*3/uL (ref 0.7–3.1)
Lymphs: 19 %
MCH: 31.4 pg (ref 26.6–33.0)
MCHC: 33.4 g/dL (ref 31.5–35.7)
MCV: 94 fL (ref 79–97)
Monocytes Absolute: 0.5 10*3/uL (ref 0.1–0.9)
Monocytes: 6 %
Neutrophils Absolute: 6.3 10*3/uL (ref 1.4–7.0)
Neutrophils: 71 %
Platelets: 294 10*3/uL (ref 150–450)
RBC: 3.89 x10E6/uL — ABNORMAL LOW (ref 4.14–5.80)
RDW: 12.2 % (ref 11.6–15.4)
WBC: 8.8 10*3/uL (ref 3.4–10.8)

## 2022-03-31 LAB — COMPREHENSIVE METABOLIC PANEL
ALT: 16 IU/L (ref 0–44)
AST: 19 IU/L (ref 0–40)
Albumin/Globulin Ratio: 1.2 (ref 1.2–2.2)
Albumin: 3.9 g/dL (ref 3.8–4.9)
Alkaline Phosphatase: 70 IU/L (ref 44–121)
BUN/Creatinine Ratio: 22 — ABNORMAL HIGH (ref 9–20)
BUN: 21 mg/dL (ref 6–24)
Bilirubin Total: 0.6 mg/dL (ref 0.0–1.2)
CO2: 25 mmol/L (ref 20–29)
Calcium: 9.2 mg/dL (ref 8.7–10.2)
Chloride: 100 mmol/L (ref 96–106)
Creatinine, Ser: 0.95 mg/dL (ref 0.76–1.27)
Globulin, Total: 3.3 g/dL (ref 1.5–4.5)
Glucose: 109 mg/dL — ABNORMAL HIGH (ref 70–99)
Potassium: 4.6 mmol/L (ref 3.5–5.2)
Sodium: 139 mmol/L (ref 134–144)
Total Protein: 7.2 g/dL (ref 6.0–8.5)
eGFR: 93 mL/min/{1.73_m2} (ref >59)

## 2022-04-02 NOTE — Progress Notes (Signed)
Please let patient know that his lab work shows that his A1c is well controlled at 6.3.  It is okay to stop the [place].  We will recheck this in 3 months. ? ?Please let him know that his complete blood count is lower than normal.  I would like him to come back and repeat the blood work in 3 weeks to make sure it improves.  Please make him a lab appt.  Please let me know if he has any questions.

## 2022-04-02 NOTE — Addendum Note (Signed)
Addended by: [name] on: 04/02/2022 12:38 PM ? ? Modules accepted: Orders ? ?

## 2022-04-20 ENCOUNTER — Ambulatory Visit (INDEPENDENT_AMBULATORY_CARE_PROVIDER_SITE_OTHER): Payer: BC Managed Care – PPO | Admitting: Nurse Practitioner

## 2022-04-20 ENCOUNTER — Other Ambulatory Visit: Payer: BC Managed Care – PPO

## 2022-04-20 ENCOUNTER — Encounter: Payer: Self-pay | Admitting: Nurse Practitioner

## 2022-04-20 VITALS — BP 136/86 | HR 81 | Temp 98.4°F | Wt 240.0 lb

## 2022-04-20 DIAGNOSIS — L97521 Non-pressure chronic ulcer of other part of left foot limited to breakdown of skin: Secondary | ICD-10-CM

## 2022-04-20 DIAGNOSIS — E11621 Type 2 diabetes mellitus with foot ulcer: Secondary | ICD-10-CM

## 2022-04-20 DIAGNOSIS — D649 Anemia, unspecified: Secondary | ICD-10-CM | POA: Diagnosis not present

## 2022-04-20 MED ORDER — ATORVASTATIN CALCIUM 20 MG PO TABS: 20.0000 mg | ORAL_TABLET | ORAL | 1 refills | Status: DC

## 2022-04-20 MED ORDER — GLIPIZIDE 5 MG PO TABS: 5.0000 mg | ORAL_TABLET | Freq: Two times a day (BID) | ORAL | 1 refills | Status: DC

## 2022-04-20 MED ORDER — METFORMIN HCL 1000 MG PO TABS: 1000.0000 mg | ORAL_TABLET | Freq: Two times a day (BID) | ORAL | 1 refills | Status: DC

## 2022-04-20 NOTE — Progress Notes (Signed)
? ?BP 136/86   Pulse 81   Temp 98.4 ?F (36.9 ?C) (Oral)   Wt 240 lb (108.9 kg)   SpO2 98%   BMI 34.44 kg/m?   ? ?Subjective:  ? ? Patient ID: [name], male    DOB: 1961/05/16, 61 y.o.   MRN: [id number] ? ?HPI: ?[name] is a 61 y.o. male ? ?Chief Complaint  ?Patient presents with  ? Ulcer  ?  Follow up on L foot ulcer- pt reports improvement  ? ?Patient would like refill on all meds.   ? ?FOOT ULCER ?Patient states it is better than it was.  No longer leaking.  He completed the course of antibiotics.  He has not been seen by Podiatry.  No fevers, discharge, warmth or redness.  ? ?Relevant past medical, surgical, family and social history reviewed and updated as indicated. Interim medical history since our last visit reviewed. ?Allergies and medications reviewed and updated. ? ?Review of Systems  ?Skin:   ?     No fevers, discharge, warmth or redness.   ? ?Per HPI unless specifically indicated above ? ?   ?Objective:  ?  ?BP 136/86   Pulse 81   Temp 98.4 ?F (36.9 ?C) (Oral)   Wt 240 lb (108.9 kg)   SpO2 98%   BMI 34.44 kg/m?   ?Wt Readings from Last 3 Encounters:  ?04/20/22 240 lb (108.9 kg)  ?03/30/22 245 lb 6.4 oz (111.3 kg)  ?11/24/21 256 lb 9.6 oz (116.4 kg)  ?  ?Physical Exam ?Vitals and nursing note reviewed.  ?Constitutional:   ?   General: He is not in acute distress. ?   Appearance: Normal appearance. He is not ill-appearing, toxic-appearing or diaphoretic.  ?HENT:  ?   Head: Normocephalic.  ?   Right Ear: External ear normal.  ?   Left Ear: External ear normal.  ?   Nose: Nose normal. No congestion or rhinorrhea.  ?   Mouth/Throat:  ?   Mouth: Mucous membranes are moist.  ?Eyes:  ?   General:     ?   Right eye: No discharge.     ?   Left eye: No discharge.  ?   Extraocular Movements: Extraocular movements intact.  ?   Conjunctiva/sclera: Conjunctivae normal.  ?   Pupils: Pupils are equal, round, and reactive to light.  ?Cardiovascular:  ?   Rate and Rhythm: Normal rate and regular  rhythm.  ?   Heart sounds: No murmur heard. ?Pulmonary:  ?   Effort: Pulmonary effort is normal. No respiratory distress.  ?   Breath sounds: Normal breath sounds. No wheezing, rhonchi or rales.  ?Abdominal:  ?   General: Abdomen is flat. Bowel sounds are normal.  ?Musculoskeletal:  ?   Cervical back: Normal range of motion and neck supple.  ?     Feet: ? ?Skin: ?   General: Skin is warm and dry.  ?   Capillary Refill: Capillary refill takes less than 2 seconds.  ?Neurological:  ?   General: No focal deficit present.  ?   Mental Status: He is alert and oriented to person, place, and time.  ?Psychiatric:     ?   Mood and Affect: Mood normal.     ?   Behavior: Behavior normal.     ?   Thought Content: Thought content normal.     ?   Judgment: Judgment normal.  ? ? ?Results for orders placed or performed in visit on  03/30/22  ?HgB A1c  ?Result Value Ref Range  ? Hgb A1c MFr Bld 6.3 (H) 4.8 - 5.6 %  ? Est. average glucose Bld gHb Est-mCnc 134 mg/dL  ?Comp Met (CMET)  ?Result Value Ref Range  ? Glucose 109 (H) 70 - 99 mg/dL  ? BUN 21 6 - 24 mg/dL  ? Creatinine, Ser 0.95 0.76 - 1.27 mg/dL  ? eGFR 93 >59 mL/min/1.73  ? BUN/Creatinine Ratio 22 (H) 9 - 20  ? Sodium 139 134 - 144 mmol/L  ? Potassium 4.6 3.5 - 5.2 mmol/L  ? Chloride 100 96 - 106 mmol/L  ? CO2 25 20 - 29 mmol/L  ? Calcium 9.2 8.7 - 10.2 mg/dL  ? Total Protein 7.2 6.0 - 8.5 g/dL  ? Albumin 3.9 3.8 - 4.9 g/dL  ? Globulin, Total 3.3 1.5 - 4.5 g/dL  ? Albumin/Globulin Ratio 1.2 1.2 - 2.2  ? Bilirubin Total 0.6 0.0 - 1.2 mg/dL  ? Alkaline Phosphatase 70 44 - 121 IU/L  ? AST 19 0 - 40 IU/L  ? ALT 16 0 - 44 IU/L  ?CBC w/Diff  ?Result Value Ref Range  ? WBC 8.8 3.4 - 10.8 x10E3/uL  ? RBC 3.89 (L) 4.14 - 5.80 x10E6/uL  ? Hemoglobin 12.2 (L) 13.0 - 17.7 g/dL  ? Hematocrit 36.5 (L) 37.5 - 51.0 %  ? MCV 94 79 - 97 fL  ? MCH 31.4 26.6 - 33.0 pg  ? MCHC 33.4 31.5 - 35.7 g/dL  ? RDW 12.2 11.6 - 15.4 %  ? Platelets 294 150 - 450 x10E3/uL  ? Neutrophils 71 Not Estab. %  ?  Lymphs 19 Not Estab. %  ? Monocytes 6 Not Estab. %  ? Eos 2 Not Estab. %  ? Basos 1 Not Estab. %  ? Neutrophils Absolute 6.3 1.4 - 7.0 x10E3/uL  ? Lymphocytes Absolute 1.7 0.7 - 3.1 x10E3/uL  ? Monocytes Absolute 0.5 0.1 - 0.9 x10E3/uL  ? EOS (ABSOLUTE) 0.2 0.0 - 0.4 x10E3/uL  ? Basophils Absolute 0.0 0.0 - 0.2 x10E3/uL  ? Immature Granulocytes 1 Not Estab. %  ? Immature Grans (Abs) 0.1 0.0 - 0.1 x10E3/uL  ? ?   ?Assessment & Plan:  ? ?Problem List Items Addressed This Visit   ?None ?Visit Diagnoses   ? ? Diabetic ulcer of left foot associated with type 2 diabetes mellitus, limited to breakdown of skin, unspecified part of foot ([place])    -  Primary  ? Resolved. Will continue to evaluate at future visits.  Refilled diabetic medications at visit today.  Follow up in 2 months.  ? Relevant Medications  ? atorvastatin (LIPITOR) 20 MG tablet  ? glipiZIDE (GLUCOTROL) 5 MG tablet  ? metFORMIN (GLUCOPHAGE) 1000 MG tablet  ? Anemia, unspecified type      ? Labs ordered today to specify type of anemia. Will make reocmmendations base don lab results.  ? ?  ?  ? ?Follow up plan: ?Return in about 2 months (around 06/20/2022) for HTN, HLD, DM2 FU. ? ? ? ? ? ?

## 2022-04-21 LAB — ANEMIA PROFILE B
Basophils Absolute: 0 10*3/uL (ref 0.0–0.2)
Basos: 0 %
EOS (ABSOLUTE): 0.3 10*3/uL (ref 0.0–0.4)
Eos: 4 %
Ferritin: 494 ng/mL — ABNORMAL HIGH (ref 30–400)
Folate: 13 ng/mL (ref >3.0)
Hematocrit: 35.2 % — ABNORMAL LOW (ref 37.5–51.0)
Hemoglobin: 12 g/dL — ABNORMAL LOW (ref 13.0–17.7)
Immature Grans (Abs): 0.1 10*3/uL (ref 0.0–0.1)
Immature Granulocytes: 1 %
Iron Saturation: 23 % (ref 15–55)
Iron: 54 ug/dL (ref 38–169)
Lymphocytes Absolute: 2.1 10*3/uL (ref 0.7–3.1)
Lymphs: 28 %
MCH: 31.5 pg (ref 26.6–33.0)
MCHC: 34.1 g/dL (ref 31.5–35.7)
MCV: 92 fL (ref 79–97)
Monocytes Absolute: 0.5 10*3/uL (ref 0.1–0.9)
Monocytes: 6 %
Neutrophils Absolute: 4.6 10*3/uL (ref 1.4–7.0)
Neutrophils: 61 %
Platelets: 226 10*3/uL (ref 150–450)
RBC: 3.81 x10E6/uL — ABNORMAL LOW (ref 4.14–5.80)
RDW: 12.7 % (ref 11.6–15.4)
Retic Ct Pct: 1.9 % (ref 0.6–2.6)
Total Iron Binding Capacity: 232 ug/dL — ABNORMAL LOW (ref 250–450)
UIBC: 178 ug/dL (ref 111–343)
Vitamin B-12: 238 pg/mL (ref 232–1245)
WBC: 7.5 10*3/uL (ref 3.4–10.8)

## 2022-04-22 IMAGING — CR DG TIBIA/FIBULA 2V*R*
4 series · 4 of 4 positions shown · non-contrast
Comparison: None.

CLINICAL DATA: 60-year-old male with wound and redness along the
RIGHT LOWER leg.

EXAM:
RIGHT TIBIA AND FIBULA - 2 VIEW

[tibia ap (1 of 2)]
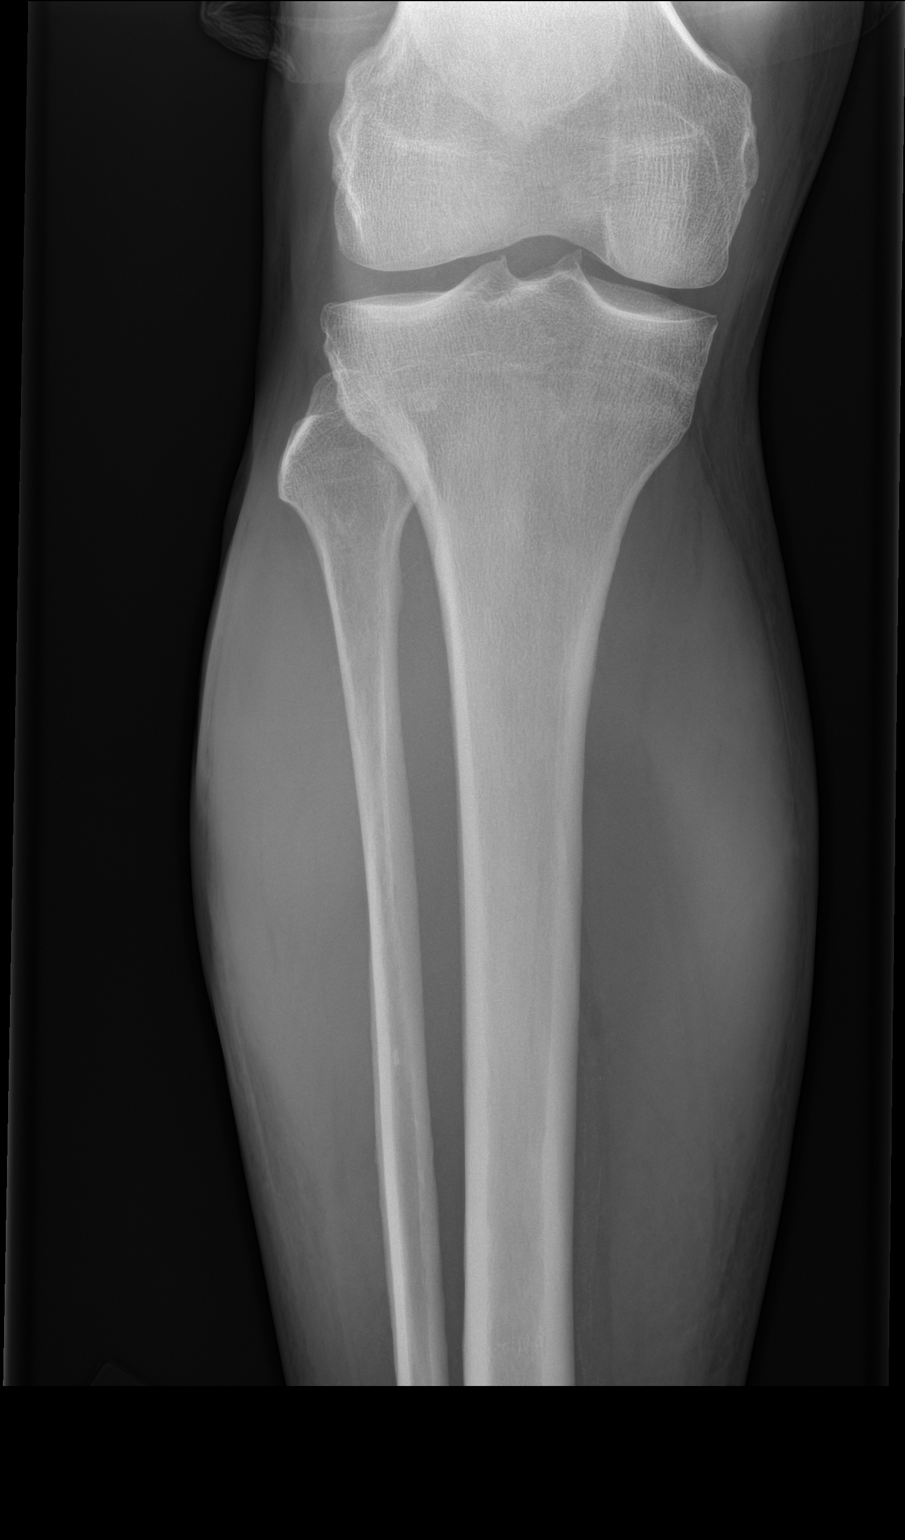

[tibia ap (2 of 2)]
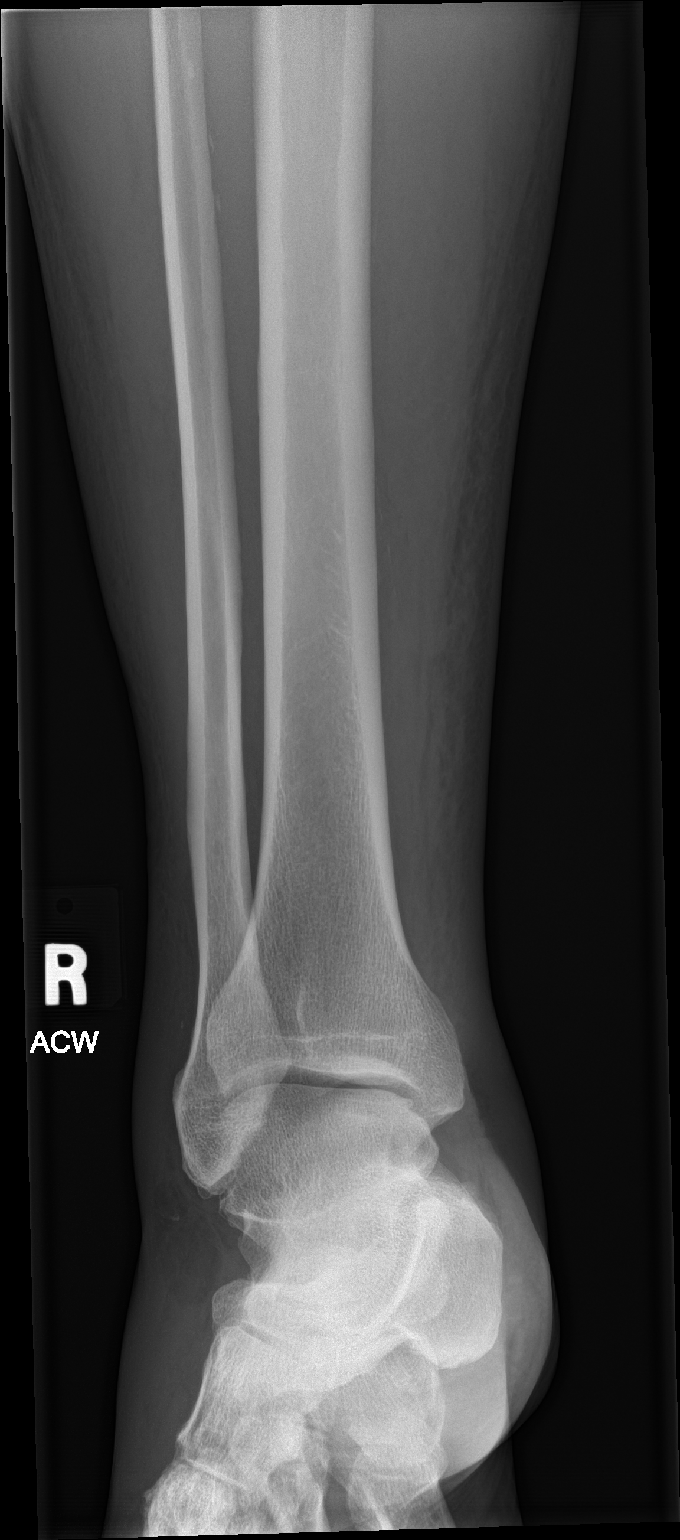

[tibia lat (1 of 2)]
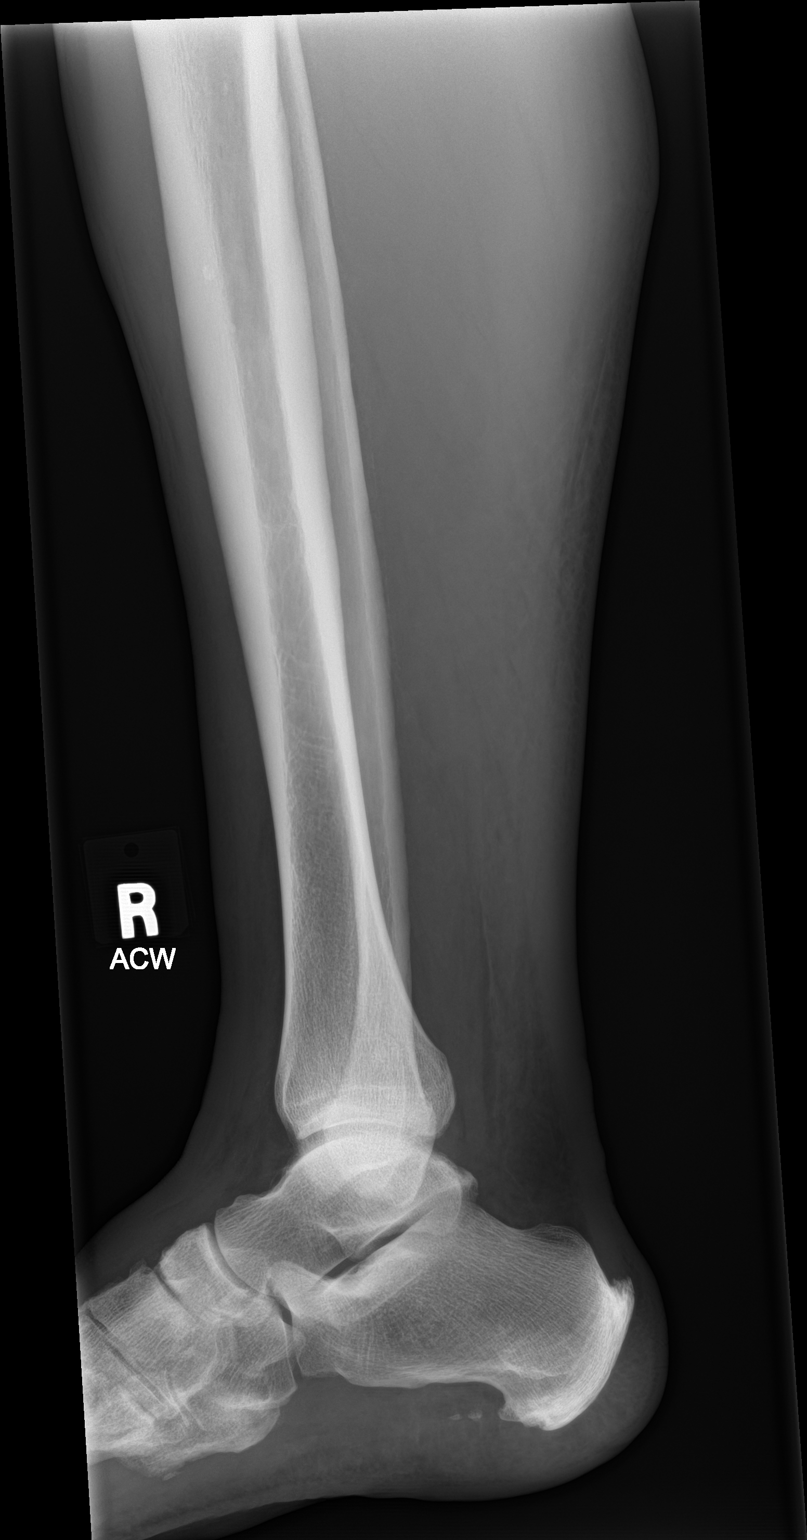

[tibia lat (2 of 2)]
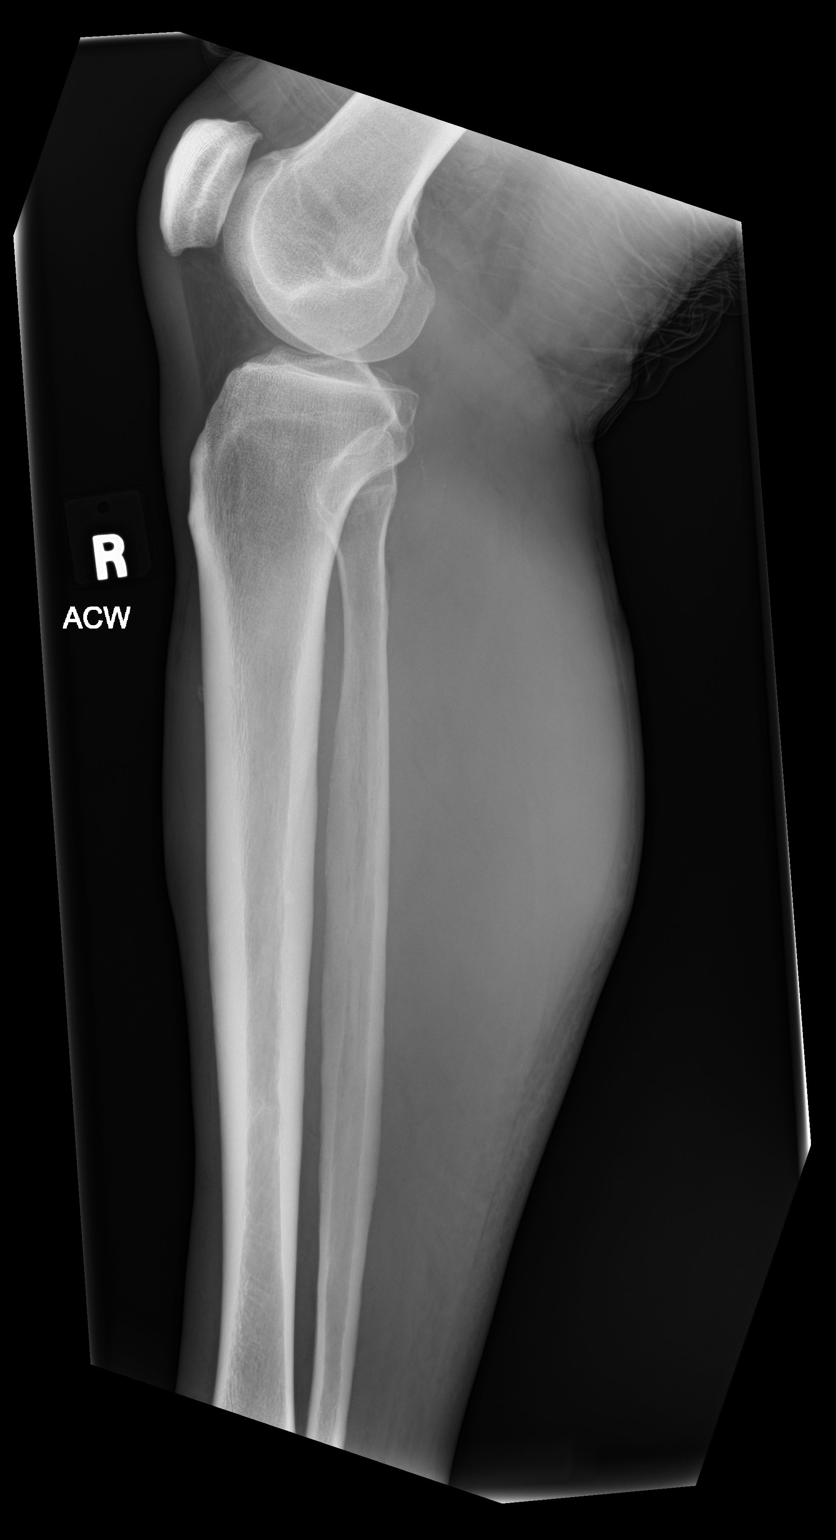

[4 of 4 positions shown; findings below may reference images not displayed]

FINDINGS: No acute fracture, subluxation or dislocation identified.

No radiographic evidence of osteomyelitis noted.

No definite soft tissue abnormalities are present. There is no
evidence of soft tissue gas.

No radiopaque foreign bodies are present.

No focal bony lesions are identified.
IMPRESSION: No acute abnormality. No evidence of osteomyelitis.

## 2022-04-24 NOTE — Progress Notes (Signed)
Please let patient know that his lab work shows that his iron is slightly low.  I recommend he start Ferrous sulfate 325mg  daily to help with this.  This can be purchased over the counter.  I recommend taking it with food because it can cause some GI upset.  It can also cause constipation if this happens he can take colace.  We will recheck this at his next visit.  Please let me know if he has any questions.

## 2022-05-25 ENCOUNTER — Ambulatory Visit: Payer: BC Managed Care – PPO | Admitting: Nurse Practitioner

## 2022-06-06 ENCOUNTER — Ambulatory Visit (INDEPENDENT_AMBULATORY_CARE_PROVIDER_SITE_OTHER): Payer: BC Managed Care – PPO | Admitting: Internal Medicine

## 2022-06-06 ENCOUNTER — Encounter: Payer: Self-pay | Admitting: Internal Medicine

## 2022-06-06 ENCOUNTER — Ambulatory Visit: Payer: Self-pay | Admitting: *Deleted

## 2022-06-06 VITALS — BP 134/78 | HR 87 | Temp 97.8°F | Ht 70.0 in | Wt 244.4 lb

## 2022-06-06 DIAGNOSIS — L97521 Non-pressure chronic ulcer of other part of left foot limited to breakdown of skin: Secondary | ICD-10-CM | POA: Diagnosis not present

## 2022-06-06 MED ORDER — MUPIROCIN 2 % EX OINT: 1.0000 | TOPICAL_OINTMENT | Freq: Two times a day (BID) | CUTANEOUS | 2 refills | Status: DC

## 2022-06-06 MED ORDER — CEPHALEXIN 500 MG PO CAPS: 500.0000 mg | ORAL_CAPSULE | Freq: Two times a day (BID) | ORAL | 0 refills | Status: AC

## 2022-06-06 NOTE — Telephone Encounter (Signed)
  Chief Complaint: Hole in bottom of left foot.  Getting worse.   Has diabetes so is concerned Symptoms: Hole at ball of foot, swelling of foot and mild ankle swelling.  He has had these holes before but this one is not clearing up on its own. Frequency: Hole appeared Sunday before last Pertinent Negatives: Patient denies having much feeling in his feet due to diabetes Disposition: [] ED /[] Urgent Care (no appt availability in office) / [x] Appointment(In office/virtual)/ []  [facility]/ [] Home Care/ [] Refused Recommended Disposition /[] [facility]/ []  Follow-up with PCP Additional Notes: Appt made for today at 2:00 with Dr. 

## 2022-06-06 NOTE — Progress Notes (Signed)
BP 134/78   Pulse 87   Temp 97.8 F (36.6 C) (Oral)   Ht 5\' 10"  (1.778 m)   Wt 244 lb 6.4 oz (110.9 kg)   SpO2 98%   BMI 35.07 kg/m    Subjective:    Patient ID: [name], male    DOB: 10-26-1961, 61 y.o.   MRN: [id number]  Chief Complaint  Patient presents with  . Diabetic Ulcer    Left foot, noticed new ulcer a week ago Sunday.  . Edema    Left foot swelling, swells more at night when in bed, swelling tends to go down during the day as he walks.    HPI: [name] is a 60 y.o. male  New ulcer on left foot.  Left great toe in 2021   Rash Chronicity: has had 2-3 lesions in the left foot.    Chief Complaint  Patient presents with  . Diabetic Ulcer    Left foot, noticed new ulcer a week ago Sunday.  . Edema    Left foot swelling, swells more at night when in bed, swelling tends to go down during the day as he walks.    Relevant past medical, surgical, family and social history reviewed and updated as indicated. Interim medical history since our last visit reviewed. Allergies and medications reviewed and updated.  Review of Systems  Skin:  Positive for rash.    Per HPI unless specifically indicated above     Objective:    BP 134/78   Pulse 87   Temp 97.8 F (36.6 C) (Oral)   Ht 5\' 10"  (1.778 m)   Wt 244 lb 6.4 oz (110.9 kg)   SpO2 98%   BMI 35.07 kg/m   Wt Readings from Last 3 Encounters:  06/06/22 244 lb 6.4 oz (110.9 kg)  04/20/22 240 lb (108.9 kg)  03/30/22 245 lb 6.4 oz (111.3 kg)    Physical Exam Vitals and nursing note reviewed.  Constitutional:      Appearance: Normal appearance. He is not diaphoretic.  HENT:     Nose: No congestion or rhinorrhea.     Mouth/Throat:     Pharynx: No oropharyngeal exudate or posterior oropharyngeal erythema.  Eyes:     Conjunctiva/sclera: Conjunctivae normal.     Pupils: Pupils are equal, round, and reactive to light.  Cardiovascular:     Heart sounds: No murmur heard.    No friction rub. No  gallop.  Pulmonary:     Effort: No respiratory distress.     Breath sounds: No stridor. No wheezing or rhonchi.  Chest:     Chest wall: No tenderness.  Abdominal:     General: Abdomen is flat. Bowel sounds are normal.     Palpations: Abdomen is soft. There is no mass.     Tenderness: There is no abdominal tenderness.  Musculoskeletal:        General: Deformity present. No swelling, tenderness or signs of injury.     Cervical back: No tenderness.     Right lower leg: No edema.     Left lower leg: No edema.  Skin:    General: Skin is warm and dry.     Coloration: Skin is not jaundiced or pale.     Findings: Erythema and lesion present. No rash.  Neurological:     Mental Status: He is alert.    Results for orders placed or performed in visit on 04/20/22  Anemia Profile B  Result Value Ref Range  Total Iron Binding Capacity 232 (L) 250 - 450 ug/dL   UIBC 144 818 - 563 ug/dL   Iron 54 38 - 149 ug/dL   Iron Saturation 23 15 - 55 %   Ferritin 494 (H) 30 - 400 ng/mL   Vitamin B-12 238 232 - 1,245 pg/mL   Folate 13.0 >3.0 ng/mL   WBC 7.5 3.4 - 10.8 x10E3/uL   RBC 3.81 (L) 4.14 - 5.80 x10E6/uL   Hemoglobin 12.0 (L) 13.0 - 17.7 g/dL   Hematocrit 70.2 (L) 63.7 - 51.0 %   MCV 92 79 - 97 fL   MCH 31.5 26.6 - 33.0 pg   MCHC 34.1 31.5 - 35.7 g/dL   RDW 85.8 85.0 - 27.7 %   Platelets 226 150 - 450 x10E3/uL   Neutrophils 61 Not Estab. %   Lymphs 28 Not Estab. %   Monocytes 6 Not Estab. %   Eos 4 Not Estab. %   Basos 0 Not Estab. %   Neutrophils Absolute 4.6 1.4 - 7.0 x10E3/uL   Lymphocytes Absolute 2.1 0.7 - 3.1 x10E3/uL   Monocytes Absolute 0.5 0.1 - 0.9 x10E3/uL   EOS (ABSOLUTE) 0.3 0.0 - 0.4 x10E3/uL   Basophils Absolute 0.0 0.0 - 0.2 x10E3/uL   Immature Granulocytes 1 Not Estab. %   Immature Grans (Abs) 0.1 0.0 - 0.1 x10E3/uL   Retic Ct Pct 1.9 0.6 - 2.6 %        Current Outpatient Medications:  .  Ascorbic Acid (VITAMIN C CR) 1500 MG TBCR, Take 1,500 mg by mouth  daily., Disp: , Rfl:  .  aspirin EC 325 MG tablet, Take 325 mg by mouth daily., Disp: , Rfl:  .  atorvastatin (LIPITOR) 20 MG tablet, Take 1 tablet (20 mg total) by mouth every other day., Disp: 90 tablet, Rfl: 1 .  BD PEN NEEDLE MICRO U/F 32G X 6 MM MISC, USE 1  ONCE DAILY AS NEEDED, Disp: 100 each, Rfl: 2 .  cephALEXin (KEFLEX) 500 MG capsule, Take 1 capsule (500 mg total) by mouth 2 (two) times daily for 7 days., Disp: 14 capsule, Rfl: 0 .  Cinnamon 500 MG capsule, Take 500 mg by mouth daily., Disp: , Rfl:  .  Garlic 1000 MG CAPS, Take 1,000 mg by mouth daily., Disp: , Rfl:  .  glipiZIDE (GLUCOTROL) 5 MG tablet, Take 1 tablet (5 mg total) by mouth 2 (two) times daily before a meal., Disp: 180 tablet, Rfl: 1 .  glucose blood (ACCU-CHEK GUIDE) test strip, 1 each by Other route daily., Disp: 100 each, Rfl: 1 .  metFORMIN (GLUCOPHAGE) 1000 MG tablet, Take 1 tablet (1,000 mg total) by mouth 2 (two) times daily with a meal., Disp: 180 tablet, Rfl: 1 .  mupirocin ointment (BACTROBAN) 2 %, Apply 1 Application topically 2 (two) times daily., Disp: 22 g, Rfl: 2    Assessment & Plan:  Recurrent diabetic Foot ulcer will need to refer to wound clinic.  Sees podiatry as well.  Start pt on keflex for such  Fu with pcp x 1 week.   Problem List Items Addressed This Visit       Musculoskeletal and Integument   Ulcer of left foot, limited to breakdown of skin [facility]) - Primary   Relevant Orders   Ambulatory referral to Wound Clinic     Orders Placed This Encounter  Procedures  . Ambulatory referral to Wound Clinic     Meds ordered this encounter  Medications  . cephALEXin (KEFLEX)  500 MG capsule    Sig: Take 1 capsule (500 mg total) by mouth 2 (two) times daily for 7 days.    Dispense:  14 capsule    Refill:  0  . mupirocin ointment (BACTROBAN) 2 %    Sig: Apply 1 Application topically 2 (two) times daily.    Dispense:  22 g    Refill:  2     Follow up plan: Return in about 1 week (around  06/13/2022).

## 2022-06-06 NOTE — Telephone Encounter (Signed)
Reason for Disposition  Looks like a boil, infected sore, or deep ulcer  Answer Assessment - Initial Assessment Questions 1. ONSET: "When did the pain start?"      I've had many holes in my foot they clear up and then come back.   I had a depression on the bottom of my foot and it dissentigrated.    Last few days it's gotten worse the hole in my foot.   I have diabetes.   I've had both feet operated on.  I've had toes removed.   I don't have much feeling in my feet. The skin on my leg feels like it's on fire. Started Sunday before last this hole appeared and has gotten worse. 2. LOCATION: "Where is the pain located?"      Left foot on the ball. 3. PAIN: "How bad is the pain?"    (Scale 1-10; or mild, moderate, severe)  - MILD (1-3): doesn't interfere with normal activities.   - MODERATE (4-7): interferes with normal activities (e.g., work or school) or awakens from sleep, limping.   - SEVERE (8-10): excruciating pain, unable to do any normal activities, unable to walk.      Mild I don't feel much in my feet due to diabetes. It's swelling too.   My foot and a little in my ankle.   My leg skin gets sensitive feeling.    I try e to elevate it. 4. WORK OR EXERCISE: "Has there been any recent work or exercise that involved this part of the body?"      No 5. CAUSE: "What do you think is causing the foot pain?"     diabetes 6. OTHER SYMPTOMS: "Do you have any other symptoms?" (e.g., leg pain, rash, fever, numbness)     See above 7. PREGNANCY: "Is there any chance you are pregnant?" "When was your last menstrual period?"     N/A  Protocols used: Foot Pain-A-AH

## 2022-06-20 ENCOUNTER — Encounter: Payer: BC Managed Care – PPO | Attending: Internal Medicine | Admitting: Internal Medicine

## 2022-06-20 ENCOUNTER — Other Ambulatory Visit: Payer: Self-pay

## 2022-06-20 ENCOUNTER — Other Ambulatory Visit
Admission: RE | Admit: 2022-06-20 | Discharge: 2022-06-20 | Disposition: A | Discharge status: Home / Self Care | Payer: BC Managed Care – PPO | Source: Ambulatory Visit | Attending: Internal Medicine | Admitting: Internal Medicine

## 2022-06-20 ENCOUNTER — Emergency Department: Payer: BC Managed Care – PPO

## 2022-06-20 ENCOUNTER — Encounter: Payer: Self-pay | Admitting: Intensive Care

## 2022-06-20 ENCOUNTER — Inpatient Hospital Stay
Admission: EM | Admit: 2022-06-20 | Discharge: 2022-06-27 | DRG: 617 | Disposition: A | Discharge status: Home / Self Care | Payer: BC Managed Care – PPO | Attending: Internal Medicine | Admitting: Internal Medicine

## 2022-06-20 DIAGNOSIS — E1142 Type 2 diabetes mellitus with diabetic polyneuropathy: Secondary | ICD-10-CM | POA: Diagnosis not present

## 2022-06-20 DIAGNOSIS — M86172 Other acute osteomyelitis, left ankle and foot: Secondary | ICD-10-CM | POA: Diagnosis not present

## 2022-06-20 DIAGNOSIS — E7800 Pure hypercholesterolemia, unspecified: Secondary | ICD-10-CM | POA: Diagnosis not present

## 2022-06-20 DIAGNOSIS — M86672 Other chronic osteomyelitis, left ankle and foot: Secondary | ICD-10-CM | POA: Diagnosis not present

## 2022-06-20 DIAGNOSIS — Z961 Presence of intraocular lens: Secondary | ICD-10-CM | POA: Diagnosis present

## 2022-06-20 DIAGNOSIS — Z89422 Acquired absence of other left toe(s): Secondary | ICD-10-CM

## 2022-06-20 DIAGNOSIS — M868X7 Other osteomyelitis, ankle and foot: Secondary | ICD-10-CM | POA: Diagnosis not present

## 2022-06-20 DIAGNOSIS — Z833 Family history of diabetes mellitus: Secondary | ICD-10-CM | POA: Diagnosis not present

## 2022-06-20 DIAGNOSIS — L97522 Non-pressure chronic ulcer of other part of left foot with fat layer exposed: Secondary | ICD-10-CM | POA: Diagnosis not present

## 2022-06-20 DIAGNOSIS — E11628 Type 2 diabetes mellitus with other skin complications: Secondary | ICD-10-CM | POA: Diagnosis not present

## 2022-06-20 DIAGNOSIS — Z7982 Long term (current) use of aspirin: Secondary | ICD-10-CM

## 2022-06-20 DIAGNOSIS — E11621 Type 2 diabetes mellitus with foot ulcer: Secondary | ICD-10-CM | POA: Diagnosis present

## 2022-06-20 DIAGNOSIS — L02612 Cutaneous abscess of left foot: Secondary | ICD-10-CM | POA: Diagnosis present

## 2022-06-20 DIAGNOSIS — I10 Essential (primary) hypertension: Secondary | ICD-10-CM | POA: Diagnosis not present

## 2022-06-20 DIAGNOSIS — E1140 Type 2 diabetes mellitus with diabetic neuropathy, unspecified: Secondary | ICD-10-CM | POA: Diagnosis not present

## 2022-06-20 DIAGNOSIS — K8020 Calculus of gallbladder without cholecystitis without obstruction: Secondary | ICD-10-CM | POA: Diagnosis not present

## 2022-06-20 DIAGNOSIS — M7989 Other specified soft tissue disorders: Secondary | ICD-10-CM | POA: Diagnosis not present

## 2022-06-20 DIAGNOSIS — Z7984 Long term (current) use of oral hypoglycemic drugs: Secondary | ICD-10-CM

## 2022-06-20 DIAGNOSIS — S91302A Unspecified open wound, left foot, initial encounter: Secondary | ICD-10-CM | POA: Diagnosis not present

## 2022-06-20 DIAGNOSIS — L03116 Cellulitis of left lower limb: Secondary | ICD-10-CM | POA: Diagnosis not present

## 2022-06-20 DIAGNOSIS — B9561 Methicillin susceptible Staphylococcus aureus infection as the cause of diseases classified elsewhere: Secondary | ICD-10-CM | POA: Diagnosis not present

## 2022-06-20 DIAGNOSIS — E669 Obesity, unspecified: Secondary | ICD-10-CM | POA: Diagnosis present

## 2022-06-20 DIAGNOSIS — Z794 Long term (current) use of insulin: Secondary | ICD-10-CM

## 2022-06-20 DIAGNOSIS — Z8616 Personal history of COVID-19: Secondary | ICD-10-CM

## 2022-06-20 DIAGNOSIS — R7401 Elevation of levels of liver transaminase levels: Secondary | ICD-10-CM | POA: Diagnosis present

## 2022-06-20 DIAGNOSIS — Z6831 Body mass index (BMI) 31.0-31.9, adult: Secondary | ICD-10-CM

## 2022-06-20 DIAGNOSIS — Z79899 Other long term (current) drug therapy: Secondary | ICD-10-CM | POA: Diagnosis not present

## 2022-06-20 DIAGNOSIS — E1169 Type 2 diabetes mellitus with other specified complication: Principal | ICD-10-CM | POA: Diagnosis present

## 2022-06-20 DIAGNOSIS — R7989 Other specified abnormal findings of blood chemistry: Secondary | ICD-10-CM | POA: Diagnosis not present

## 2022-06-20 DIAGNOSIS — M19072 Primary osteoarthritis, left ankle and foot: Secondary | ICD-10-CM | POA: Diagnosis not present

## 2022-06-20 DIAGNOSIS — E119 Type 2 diabetes mellitus without complications: Secondary | ICD-10-CM

## 2022-06-20 DIAGNOSIS — K219 Gastro-esophageal reflux disease without esophagitis: Secondary | ICD-10-CM | POA: Diagnosis not present

## 2022-06-20 DIAGNOSIS — L97529 Non-pressure chronic ulcer of other part of left foot with unspecified severity: Secondary | ICD-10-CM | POA: Diagnosis present

## 2022-06-20 DIAGNOSIS — T148XXA Other injury of unspecified body region, initial encounter: Principal | ICD-10-CM

## 2022-06-20 DIAGNOSIS — R748 Abnormal levels of other serum enzymes: Secondary | ICD-10-CM | POA: Diagnosis not present

## 2022-06-20 DIAGNOSIS — L089 Local infection of the skin and subcutaneous tissue, unspecified: Secondary | ICD-10-CM | POA: Diagnosis present

## 2022-06-20 DIAGNOSIS — A4901 Methicillin susceptible Staphylococcus aureus infection, unspecified site: Secondary | ICD-10-CM | POA: Diagnosis not present

## 2022-06-20 DIAGNOSIS — M1990 Unspecified osteoarthritis, unspecified site: Secondary | ICD-10-CM | POA: Diagnosis present

## 2022-06-20 DIAGNOSIS — I96 Gangrene, not elsewhere classified: Secondary | ICD-10-CM | POA: Diagnosis not present

## 2022-06-20 DIAGNOSIS — M869 Osteomyelitis, unspecified: Secondary | ICD-10-CM | POA: Diagnosis not present

## 2022-06-20 DIAGNOSIS — E785 Hyperlipidemia, unspecified: Secondary | ICD-10-CM | POA: Diagnosis present

## 2022-06-20 LAB — CBC WITH DIFFERENTIAL/PLATELET
Abs Immature Granulocytes: 0.08 10*3/uL — ABNORMAL HIGH (ref 0.00–0.07)
Basophils Absolute: 0.1 10*3/uL (ref 0.0–0.1)
Basophils Relative: 0 %
Eosinophils Absolute: 0.2 10*3/uL (ref 0.0–0.5)
Eosinophils Relative: 1 %
HCT: 33.8 % — ABNORMAL LOW (ref 39.0–52.0)
Hemoglobin: 11 g/dL — ABNORMAL LOW (ref 13.0–17.0)
Immature Granulocytes: 1 %
Lymphocytes Relative: 9 %
Lymphs Abs: 1.3 10*3/uL (ref 0.7–4.0)
MCH: 30.3 pg (ref 26.0–34.0)
MCHC: 32.5 g/dL (ref 30.0–36.0)
MCV: 93.1 fL (ref 80.0–100.0)
Monocytes Absolute: 0.6 10*3/uL (ref 0.1–1.0)
Monocytes Relative: 4 %
Neutro Abs: 11.7 10*3/uL — ABNORMAL HIGH (ref 1.7–7.7)
Neutrophils Relative %: 85 %
Platelets: 392 10*3/uL (ref 150–400)
RBC: 3.63 MIL/uL — ABNORMAL LOW (ref 4.22–5.81)
RDW: 12.3 % (ref 11.5–15.5)
WBC: 13.9 10*3/uL — ABNORMAL HIGH (ref 4.0–10.5)
nRBC: 0 % (ref 0.0–0.2)

## 2022-06-20 LAB — GLUCOSE, CAPILLARY: Glucose-Capillary: 103 mg/dL — ABNORMAL HIGH (ref 70–99)

## 2022-06-20 LAB — COMPREHENSIVE METABOLIC PANEL
ALT: 187 U/L — ABNORMAL HIGH (ref 0–44)
AST: 193 U/L — ABNORMAL HIGH (ref 15–41)
Albumin: 3 g/dL — ABNORMAL LOW (ref 3.5–5.0)
Alkaline Phosphatase: 85 U/L (ref 38–126)
Anion gap: 11 (ref 5–15)
BUN: 30 mg/dL — ABNORMAL HIGH (ref 6–20)
CO2: 25 mmol/L (ref 22–32)
Calcium: 8.7 mg/dL — ABNORMAL LOW (ref 8.9–10.3)
Chloride: 101 mmol/L (ref 98–111)
Creatinine, Ser: 1.18 mg/dL (ref 0.61–1.24)
GFR, Estimated: >60 mL/min (ref >60)
Glucose, Bld: 166 mg/dL — ABNORMAL HIGH (ref 70–99)
Potassium: 4.2 mmol/L (ref 3.5–5.1)
Sodium: 137 mmol/L (ref 135–145)
Total Bilirubin: 0.6 mg/dL (ref 0.3–1.2)
Total Protein: 8.1 g/dL (ref 6.5–8.1)

## 2022-06-20 LAB — SEDIMENTATION RATE: Sed Rate: 115 mm/hr — ABNORMAL HIGH (ref 0–20)

## 2022-06-20 LAB — HEPATITIS PANEL, ACUTE
HCV Ab: NONREACTIVE
Hep A IgM: NONREACTIVE
Hep B C IgM: NONREACTIVE
Hepatitis B Surface Ag: NONREACTIVE

## 2022-06-20 LAB — HIV ANTIBODY (ROUTINE TESTING W REFLEX): HIV Screen 4th Generation wRfx: NONREACTIVE

## 2022-06-20 LAB — LACTIC ACID, PLASMA
Lactic Acid, Venous: 1 mmol/L (ref 0.5–1.9)
Lactic Acid, Venous: 0.9 mmol/L (ref 0.5–1.9)

## 2022-06-20 LAB — HEMOGLOBIN A1C
Hgb A1c MFr Bld: 6.2 % — ABNORMAL HIGH (ref 4.8–5.6)
Mean Plasma Glucose: 131.24 mg/dL

## 2022-06-20 LAB — C-REACTIVE PROTEIN: CRP: 24.8 mg/dL — ABNORMAL HIGH (ref <1.0)

## 2022-06-20 LAB — MRSA NEXT GEN BY PCR, NASAL: MRSA by PCR Next Gen: NOT DETECTED

## 2022-06-20 MED ORDER — INSULIN ASPART 100 UNIT/ML IJ SOLN
0.0000 [IU] | Freq: Three times a day (TID) | INTRAMUSCULAR | Status: DC
  Administered 2022-06-21: 2 [IU] via SUBCUTANEOUS
  Administered 2022-06-22: 3 [IU] via SUBCUTANEOUS
  Administered 2022-06-22: 2 [IU] via SUBCUTANEOUS
  Administered 2022-06-22 – 2022-06-23 (×3): 1 [IU] via SUBCUTANEOUS
  Administered 2022-06-23 – 2022-06-24 (×2): 2 [IU] via SUBCUTANEOUS
  Administered 2022-06-24 (×2): 1 [IU] via SUBCUTANEOUS
  Administered 2022-06-25 (×2): 2 [IU] via SUBCUTANEOUS
  Administered 2022-06-25 – 2022-06-26 (×3): 1 [IU] via SUBCUTANEOUS
  Administered 2022-06-26: 2 [IU] via SUBCUTANEOUS
  Administered 2022-06-27: 1 [IU] via SUBCUTANEOUS
  Administered 2022-06-27: 2 [IU] via SUBCUTANEOUS
  Filled 2022-06-20 (×18): qty 1

## 2022-06-20 MED ORDER — OXYCODONE HCL 5 MG PO TABS: 5.0000 mg | ORAL_TABLET | ORAL | Status: DC | PRN

## 2022-06-20 MED ORDER — SODIUM CHLORIDE 0.9 % IV SOLN: INTRAVENOUS | Status: AC

## 2022-06-20 MED ORDER — ONDANSETRON HCL 4 MG/2ML IJ SOLN: 4.0000 mg | Freq: Four times a day (QID) | INTRAMUSCULAR | Status: DC | PRN

## 2022-06-20 MED ORDER — TRAMADOL HCL 50 MG PO TABS
50.0000 mg | ORAL_TABLET | Freq: Three times a day (TID) | ORAL | Status: DC | PRN
  Administered 2022-06-24 – 2022-06-25 (×2): 50 mg via ORAL
  Filled 2022-06-20 (×2): qty 1

## 2022-06-20 MED ORDER — ONDANSETRON HCL 4 MG PO TABS: 4.0000 mg | ORAL_TABLET | Freq: Four times a day (QID) | ORAL | Status: DC | PRN

## 2022-06-20 MED ORDER — VANCOMYCIN HCL 1250 MG/250ML IV SOLN
1250.0000 mg | Freq: Two times a day (BID) | INTRAVENOUS | Status: DC
  Administered 2022-06-21: 1250 mg via INTRAVENOUS
  Filled 2022-06-20 (×2): qty 250

## 2022-06-20 MED ORDER — CEFEPIME HCL 2 G IV SOLR
2.0000 g | Freq: Three times a day (TID) | INTRAVENOUS | Status: DC
  Administered 2022-06-20 – 2022-06-24 (×11): 2 g via INTRAVENOUS
  Filled 2022-06-20: qty 12.5
  Filled 2022-06-20 (×2): qty 2
  Filled 2022-06-20 (×4): qty 12.5
  Filled 2022-06-20: qty 2
  Filled 2022-06-20 (×5): qty 12.5

## 2022-06-20 MED ORDER — VANCOMYCIN HCL IN DEXTROSE 1-5 GM/200ML-% IV SOLN
1000.0000 mg | Freq: Once | INTRAVENOUS | Status: AC
  Administered 2022-06-20: 1000 mg via INTRAVENOUS
  Filled 2022-06-20: qty 200

## 2022-06-20 MED ORDER — ALBUTEROL SULFATE (2.5 MG/3ML) 0.083% IN NEBU: 2.5000 mg | INHALATION_SOLUTION | RESPIRATORY_TRACT | Status: DC | PRN

## 2022-06-20 MED ORDER — HEPARIN SODIUM (PORCINE) 5000 UNIT/ML IJ SOLN
5000.0000 [IU] | Freq: Three times a day (TID) | INTRAMUSCULAR | Status: DC
Start: 2022-06-20 — End: 2022-06-21
  Administered 2022-06-20 – 2022-06-21 (×2): 5000 [IU] via SUBCUTANEOUS
  Filled 2022-06-20 (×2): qty 1

## 2022-06-20 MED ORDER — LACTATED RINGERS IV BOLUS
1000.0000 mL | Freq: Once | INTRAVENOUS | Status: AC
  Administered 2022-06-20: 1000 mL via INTRAVENOUS

## 2022-06-20 MED ORDER — VANCOMYCIN HCL 1500 MG/300ML IV SOLN
1500.0000 mg | Freq: Once | INTRAVENOUS | Status: AC
  Administered 2022-06-20: 1500 mg via INTRAVENOUS
  Filled 2022-06-20: qty 300

## 2022-06-20 MED ORDER — SODIUM CHLORIDE 0.9 % IV SOLN
2.0000 g | Freq: Once | INTRAVENOUS | Status: AC
  Administered 2022-06-20: 2 g via INTRAVENOUS
  Filled 2022-06-20: qty 12.5

## 2022-06-20 NOTE — ED Notes (Addendum)
Paused lr infusion for cefepime admin per pharmacy instrucitons

## 2022-06-20 NOTE — H&P (Addendum)
History and Physical    [name] WUX:[id number] DOB: 09-01-1961 DOA: 06/20/2022  PCP: [name], NP  Patient coming from: Home  I have personally briefly reviewed patient's old medical records in [facility]  Chief Complaint: increase swelling and drainage of left foot chronic ulcer   HPI: [name] is a 61 y.o. male with medical history significant of HLD,GERD, obesity DMII insulin dependent, hx of osteomyelitis s/p 1st and 2nd toes left foot performed on 01/07/21, who has had chronic non healing ulcer on plantar aspect of left foot. Patient of note has history of treatment with kelfex starting on 6/28 for 14 days for wound infection. However patient states despite antibiotics over the last 2 days his foot has become more swollen and red with increase drainage. He was seen in clinic today and referred to ED due to concern for progressive infection . He notes no fever/chills/n/v/diarrhea/ dysuria/ sob/ or chest pain. Does note pain in affected foot.   ED Course:  Vitals: Afeb,, bp 124/72, hr 94, rr 187 sat 96%  Wbc: 13.9, hgb 11, plt 392 NA: 137, K 4.2, glu 166, ast193, alt 187  Lactic 1.0,  [id number] Lactic 0.9 MRSA neg A1c 6.2 Xray left foot IMPRESSION: 1. No acute fracture or dislocation. 2. 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 3. Soft tissue thickening of the forefoot and chronic changes of the second metatarsal and third MTP joint. MRI or a white blood cell nuclear scan may provide better evaluation if there is high clinical concern for acute osteomyelitis. RUQ IMPRESSION: Cholelithiasis. No sonographic signs of cholecystitis or biliary ductal dilatation.   Acute hep panel pending Unremarkable sonographic appearance of liver. Tx cefepime, vanc, LR 1L Review of Systems: As per HPI otherwise 10 point review of systems negative.   Past Medical History:  Diagnosis Date   Arthritis    hands    COVID-19 12/29/2019   Diabetes mellitus without complication (HCC)    type 2   GERD (gastroesophageal reflux disease)    Hypercholesterolemia    Osteomyelitis of left foot [facility])     Past Surgical History:  Procedure Laterality Date   ADENOIDECTOMY     AMPUTATION Right 10/07/2020   Procedure: AMPUTATION RAY-Right Fifth Ray;  Surgeon: Rosetta Posner, DPM;  Location: [facility] ORS;  Service: Podiatry;  Laterality: Right;   AMPUTATION Left 01/07/2021   Procedure: AMPUTATION RAY - Left 1st and 2nd Ray;  Surgeon: Rosetta Posner, DPM;  Location: [facility] OR[facility];  Service: Podiatry;  Laterality: Left;   AMPUTATION TOE     CATARACT EXTRACTION W/PHACO Right 03/03/2020   Procedure: CATARACT EXTRACTION PHACO AND INTRAOCULAR LENS PLACEMENT (IOC) RIGHT DIABETIC VISION BLUE;  Surgeon: Elliot Cousin, MD;  Location: [facility];  Service: Ophthalmology;  Laterality: Right;  COVID ( + ) 12-29-2019  30.97 02:27.7   CATARACT EXTRACTION W/PHACO Left 06/23/2020   Procedure: CATARACT EXTRACTION PHACO AND INTRAOCULAR LENS PLACEMENT (IOC) LEFT DIABETIC VISION BLUE 5.14  00:34.7;  Surgeon: Elliot Cousin, MD;  Location: [facility];  Service: Ophthalmology;  Laterality: Left;  DIABETIC   ELBOW FRACTURE SURGERY Right    EXTERNAL EAR SURGERY     IRRIGATION AND DEBRIDEMENT FOOT Right 10/07/2020   Procedure: IRRIGATION AND DEBRIDEMENT FOOT;  Surgeon: Rosetta Posner, DPM;  Location: [facility] ORS;  Service: Podiatry;  Laterality: Right;     reports that he has never smoked. He has never used smokeless tobacco. He reports that he does not currently  use alcohol. He reports that he does not use drugs.  No Known Allergies  Family History  Problem Relation Age of Onset   Dementia Mother    Hypertension Mother    Cancer Brother        Cancer   Diabetes Maternal Grandmother    Hypertension Maternal Grandmother    COPD Neg Hx    Heart disease Neg Hx    Stroke Neg Hx    Prior to Admission medications   Medication Sig  Start Date End Date Taking? Authorizing Provider  Ascorbic Acid (VITAMIN C CR) 1500 MG TBCR Take 1,500 mg by mouth daily.    [provider]  aspirin EC 325 MG tablet Take 325 mg by mouth daily.    [provider]  atorvastatin (LIPITOR) 20 MG tablet Take 1 tablet (20 mg total) by mouth every other day. 04/20/22   [name], NP  BD PEN NEEDLE MICRO U/F 32G X 6 MM MISC USE 1  ONCE DAILY AS NEEDED 12/05/21   [name], NP  Cinnamon 500 MG capsule Take 500 mg by mouth daily.    [provider]  Garlic 1000 MG CAPS Take 1,000 mg by mouth daily.    [provider]  glipiZIDE (GLUCOTROL) 5 MG tablet Take 1 tablet (5 mg total) by mouth 2 (two) times daily before a meal. 04/20/22   [name], NP  glucose blood (ACCU-CHEK GUIDE) test strip 1 each by Other route daily. 02/17/21   [name], NP  metFORMIN (GLUCOPHAGE) 1000 MG tablet Take 1 tablet (1,000 mg total) by mouth 2 (two) times daily with a meal. 04/20/22   [name], NP  mupirocin ointment (BACTROBAN) 2 % Apply 1 Application topically 2 (two) times daily. 06/06/22   Loura Pardon, MD    Physical Exam: Vitals:   06/20/22 1419 06/20/22 1641 06/20/22 1700 06/20/22 1802  BP:  (!) 151/81 (!) 151/78 (!) 150/81  Pulse:  92 88 91  Resp:  18  17  Temp:  98.5 F (36.9 C)    TempSrc:  Oral    SpO2:  99% 97% 99%  Weight: 105.2 kg     Height: 6' (1.829 m)        Vitals:   06/20/22 1419 06/20/22 1641 06/20/22 1700 06/20/22 1802  BP:  (!) 151/81 (!) 151/78 (!) 150/81  Pulse:  92 88 91  Resp:  18  17  Temp:  98.5 F (36.9 C)    TempSrc:  Oral    SpO2:  99% 97% 99%  Weight: 105.2 kg     Height: 6' (1.829 m)     Constitutional: NAD, calm, comfortable Eyes: PERRL, lids and conjunctivae normal ENMT: Mucous membranes are moist. Posterior pharynx clear of any exudate or lesions.Normal dentition.  Neck: normal, supple, no masses, no thyromegaly Respiratory: clear to auscultation  bilaterally, no wheezing, no crackles. Normal respiratory effort. No accessory muscle use.  Cardiovascular: Regular rate and rhythm, no murmurs / rubs / gallops. No extremity edema. Warm and pink extremities.  Abdomen: no tenderness, no masses palpated. No hepatosplenomegaly. Bowel sounds positive.  Musculoskeletal: no clubbing / cyanosis. No joint deformity upper and lower extremities. Good ROM, no contractures. Normal muscle tone.  Skin: left foot with noted swelling and warmth and erythema an uler on dorsum draining purulent fluid  Neurologic: CN 2-12 grossly intact. Sensation intact, l. Strength 5/5 in all 4.  Psychiatric: Normal judgment and insight. Alert and oriented x 3. Normal mood.  Labs on Admission: I have personally reviewed following labs and imaging studies  CBC: Recent Labs  Lab 06/20/22 1426  WBC 13.9*  NEUTROABS 11.7*  HGB 11.0*  HCT 33.8*  MCV 93.1  PLT 392   Basic Metabolic Panel: Recent Labs  Lab 06/20/22 1426  NA 137  K 4.2  CL 101  CO2 25  GLUCOSE 166*  BUN 30*  CREATININE 1.18  CALCIUM 8.7*   GFR: Estimated Creatinine Clearance: 84.5 mL/min (by C-G formula based on SCr of 1.18 mg/dL). Liver Function Tests: Recent Labs  Lab 06/20/22 1426  AST 193*  ALT 187*  ALKPHOS 85  BILITOT 0.6  PROT 8.1  ALBUMIN 3.0*   No results for input(s): "LIPASE", "AMYLASE" in the last 168 hours. No results for input(s): "AMMONIA" in the last 168 hours. Coagulation Profile: No results for input(s): "INR", "PROTIME" in the last 168 hours. Cardiac Enzymes: No results for input(s): "CKTOTAL", "CKMB", "CKMBINDEX", "TROPONINI" in the last 168 hours. BNP (last 3 results) No results for input(s): "PROBNP" in the last 8760 hours. HbA1C: No results for input(s): "HGBA1C" in the last 72 hours. CBG: No results for input(s): "GLUCAP" in the last 168 hours. Lipid Profile: No results for input(s): "CHOL", "HDL", "LDLCALC", "TRIG", "CHOLHDL", "LDLDIRECT" in the last 72  hours. Thyroid Function Tests: No results for input(s): "TSH", "T4TOTAL", "FREET4", "T3FREE", "THYROIDAB" in the last 72 hours. Anemia Panel: No results for input(s): "VITAMINB12", "FOLATE", "FERRITIN", "TIBC", "IRON", "RETICCTPCT" in the last 72 hours. Urine analysis:    Component Value Date/Time   COLORURINE YELLOW (A) 01/06/2021 2230   APPEARANCEUR Clear 11/24/2021 1337   LABSPEC 1.021 01/06/2021 2230   PHURINE 5.0 01/06/2021 2230   GLUCOSEU Negative 11/24/2021 1337   HGBUR SMALL (A) 01/06/2021 2230   BILIRUBINUR Negative 11/24/2021 1337   KETONESUR 20 (A) 01/06/2021 2230   PROTEINUR 3+ (A) 11/24/2021 1337   PROTEINUR 100 (A) 01/06/2021 2230   NITRITE Negative 11/24/2021 1337   NITRITE NEGATIVE 01/06/2021 2230   LEUKOCYTESUR Negative 11/24/2021 1337   LEUKOCYTESUR NEGATIVE 01/06/2021 2230    Radiological Exams on Admission: US ABDOMEN LIMITED RUQ (LIVER/GB)  Result Date: 06/20/2022 CLINICAL DATA:  Transaminitis. EXAM: ULTRASOUND ABDOMEN LIMITED RIGHT UPPER QUADRANT COMPARISON:  None Available. FINDINGS: Gallbladder: Numerous small less than 1 cm gallstones are seen. No evidence of gallbladder dilatation or wall thickening. No sonographic Murphy sign noted by sonographer. Common bile duct: Diameter: 5 mm, within normal limits. Liver: No focal lesion identified. Within normal limits in parenchymal echogenicity. Portal vein is patent on color Doppler imaging with normal direction of blood flow towards the liver. Other: None. IMPRESSION: Cholelithiasis. No sonographic signs of cholecystitis or biliary ductal dilatation. Unremarkable sonographic appearance of liver. Electronically Signed   By: Danae Orleans M.D.   On: 06/20/2022 18:12   DG Foot Complete Left  Result Date: 06/20/2022 CLINICAL DATA:  Infection and concern for osteomyelitis.  Diabetes. EXAM: LEFT FOOT - COMPLETE 3+ VIEW COMPARISON:  Left foot radiograph dated 01/07/2021. FINDINGS: Status post prior amputation of the  midportion of the first metatarsal and second phalanges. Several small bone densities adjacent to the first metatarsal amputation may be chronic. Osteomyelitis is not excluded. There is irregularity of the second metatarsal likely related to chronic infection or less likely old healed fracture. There is sclerotic changes and irregularity of the third metatarsophalangeal joint consistent with chronic osteomyelitis. There is no acute fracture or dislocation. The bones are osteopenic. There is soft tissue thickening of the forefoot primarily over  the first and second ray. No soft tissue gas. IMPRESSION: 1. No acute fracture or dislocation. 2. 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 3. Soft tissue thickening of the forefoot and chronic changes of the second metatarsal and third MTP joint. MRI or a white blood cell nuclear scan may provide better evaluation if there is high clinical concern for acute osteomyelitis. Electronically Signed   By: Elgie Collard M.D.   On: 06/20/2022 14:45    EKG: Independently reviewed.  Assessment/Plan  Left diabetic foot infection associated with nonhealing ulcer/wound  -admit to tele  -continue on vanc/cefepime -f/u with MRI results  -xray , no obvious osteomyelitis  -podiatry consulted Dr Sharren Bridge will see in am  -supportive care with pain medications   Elevated AST/ALT -unclear cause , possible statin related -RUQ no acute abnormality /noted gall stones -no elevation in bili or alkphos - continue to monitor labs  - acute hep panel pending   DMII, non-insulin dependent  - place on iss/fs  - hold oral hypoglycemic  -a1c 6.2   GERD -ppi  DVT prophylaxis: heparin Code Status: full Family Communication: non at bedside Disposition Plan: patient  expected to be admitted greater than 2 midnights  Consults called: podiatry see above Admission status: med tele   Lurline Del MD [facility]  If 7PM-7AM, please contact night-coverage [email or web address] Password [facility]  06/20/2022, 7:27 PM

## 2022-06-20 NOTE — ED Triage Notes (Signed)
Patient reports he has wound on bottom of left foot and redness noted to top of foot. Foot is also swollen. Was seen at wound clinic and told to come to ER for treatment. Last antibiotic finished last week

## 2022-06-20 NOTE — Consult Note (Addendum)
PHARMACY -  BRIEF ANTIBIOTIC NOTE   Pharmacy has received consult(s) for osteo from an ED provider.  The patient's profile has been reviewed for ht/wt/allergies/indication/available labs.    One time order(s) placed for vancomycin and cefepime  Further antibiotics/pharmacy consults should be ordered by admitting physician if indicated.                       Thank you, Ronnald Ramp 06/20/2022  4:56 PM

## 2022-06-20 NOTE — ED Provider Triage Note (Signed)
Emergency Medicine Provider Triage Evaluation Note  [name] , a 61 y.o. male  was evaluated in triage.  Pt complains of left foot pain and infection.  Patient took antibiotics and finished 1 week ago.  States foot has become more swollen red and painful with drainage.  Wound center sent him over here stating they could not do anything as it was too infected.  Patient has history of diabetes.  Review of Systems  Positive: Left foot pain, wound infection Negative: Fever chills  Physical Exam  BP 124/72 (BP Location: Left Arm)   Pulse 94   Temp 98.8 F (37.1 C) (Oral)   Resp 18   SpO2 96%  Gen:   Awake, no distress   Resp:  Normal effort  MSK:   Moves extremities without difficulty .  Left foot is in a boot we did not undress in triage Other:    Medical Decision Making  Medically screening exam initiated at 2:19 PM.  Appropriate orders placed.  [name] was informed that the remainder of the evaluation will be completed by another provider, this initial triage assessment does not replace that evaluation, and the importance of remaining in the ED until their evaluation is complete.  Nursing staff instructed to do sepsis work-up due to patient being diabetic with foot infection.  X-ray ordered    Faythe Ghee, PA-C 06/20/22 1420

## 2022-06-20 NOTE — Consult Note (Addendum)
Pharmacy Antibiotic Note  [name] is a 61 y.o. male admitted on 06/20/2022 with  DFI / concern for osteomyelitis .  Pharmacy has been consulted for vancomycin dosing. Patient is also ordered cefepime.  Plan:  Vancomycin 2.5 g IV LD followed by vancomycin 1.25 g IV q12h --Calculated AUC: 501, Cmin 15.1 --BMI 31; Used Vd 0.7 --Daily Scr per protocol --Levels at steady state or as clinically indicated  Height: 6' (182.9 cm) Weight: 105.2 kg (232 lb) IBW/kg (Calculated) : 77.6  Temp (24hrs), Avg:98.7 F (37.1 C), Min:98.5 F (36.9 C), Max:98.8 F (37.1 C)  Recent Labs  Lab 06/20/22 1426 06/20/22 1658  WBC 13.9*  --   CREATININE 1.18  --   LATICACIDVEN 1.0 0.9    Estimated Creatinine Clearance: 84.5 mL/min (by C-G formula based on SCr of 1.18 mg/dL).    No Known Allergies  Antimicrobials this admission: Cefepime 7/12 >>  Vancomycin 7/12 >>   Dose adjustments this admission: N/A  Microbiology results: 7/12 BCx: pending 7/12 MRSA PCR: (-)  Thank you for allowing pharmacy to be a part of this patient's care.  Tressie Ellis 06/20/2022 7:54 PM

## 2022-06-20 NOTE — ED Provider Notes (Signed)
[facility] Provider Note    None    (approximate)   History   Wound Infection   HPI  [name] is a 61 y.o. male with a past medical history of DM, arthritis, GERD, HDL and previous osteomyelitis of the left foot followed by podiatry and wound care recently treated for cellulitis with a course of Keflex having.  She is on 7/4 who presents after being referred from wound clinic with concerns for worsening infection in his left foot.  Patient states that he noticed ulcer couple weeks ago that seem to get better and then the whole foot seem to get red and swollen in the last 2 days.  He does not recall any injuries or falls.  Denies any pain of the foot or any other sick symptoms such as chest pain, cough, fevers, nausea, vomiting, diarrhea or rash.  Denies any significant Tylenol use or EtOH use.  Reports compliant with his diabetes medications.    Past Medical History:  Diagnosis Date   Arthritis    hands   COVID-19 12/29/2019   Diabetes mellitus without complication (HCC)    type 2   GERD (gastroesophageal reflux disease)    Hypercholesterolemia    Osteomyelitis of left foot [facility])      Physical Exam  Triage Vital Signs: ED Triage Vitals  Enc Vitals Group     BP 06/20/22 1415 124/72     Pulse Rate 06/20/22 1415 94     Resp 06/20/22 1415 18     Temp 06/20/22 1415 98.8 F (37.1 C)     Temp Source 06/20/22 1415 Oral     SpO2 06/20/22 1415 96 %     Weight 06/20/22 1419 232 lb (105.2 kg)     Height 06/20/22 1419 6' (1.829 m)     Head Circumference --      Peak Flow --      Pain Score 06/20/22 1419 0     Pain Loc --      Pain Edu? --      Excl. in [place]? --     Most recent vital signs: Vitals:   06/20/22 1700 06/20/22 1802  BP: (!) 151/78 (!) 150/81  Pulse: 88 91  Resp:  17  Temp:    SpO2: 97% 99%    General: Awake, no distress.  CV:  Good peripheral perfusion.  Resp:  Normal effort.  Abd:  No distention.  Other:  There is a  little bit of purulent drainage from the ulceration on the distal superior aspect of the left foot.  Entire left distal aspect of the foot is very edematous and erythematous and warm.  This extends up towards the ankle with erythema extending up to just above the ankle and some edema as well.  No significant pain on ranging of the ankle.  He does have sensation intact to light touch but reports it is less in the right foot.  Remainder of lower extremities are unremarkable.   ED Results / Procedures / Treatments  Labs (all labs ordered are listed, but only abnormal results are displayed) Labs Reviewed  COMPREHENSIVE METABOLIC PANEL - Abnormal; Notable for the following components:      Result Value   Glucose, Bld 166 (*)    BUN 30 (*)    Calcium 8.7 (*)    Albumin 3.0 (*)    AST 193 (*)    ALT 187 (*)    All other components within normal limits  CBC WITH DIFFERENTIAL/PLATELET - Abnormal; Notable for the following components:   WBC 13.9 (*)    RBC 3.63 (*)    Hemoglobin 11.0 (*)    HCT 33.8 (*)    Neutro Abs 11.7 (*)    Abs Immature Granulocytes 0.08 (*)    All other components within normal limits  SEDIMENTATION RATE - Abnormal; Notable for the following components:   Sed Rate 115 (*)    All other components within normal limits  MRSA NEXT GEN BY PCR, NASAL  CULTURE, BLOOD (ROUTINE X 2)  CULTURE, BLOOD (ROUTINE X 2)  LACTIC ACID, PLASMA  LACTIC ACID, PLASMA  C-REACTIVE PROTEIN  HEPATITIS PANEL, ACUTE  HEMOGLOBIN A1C  HIV ANTIBODY (ROUTINE TESTING W REFLEX)     EKG   RADIOLOGY  X-ray of the left foot on my interpretation shows no acute fracture dislocation and previous amputation of the first and second phalanges with thickening and chronic changes.  I do not see clear osteomyelitis.  I also reviewed radiology's interpretation.  Right upper quadrant ultrasound my interpretation shows gallstones but nondilated common bile duct and otherwise normal-appearing liver.  I also  reviewed radiologist interpretation and agree to findings of same.   PROCEDURES:  Critical Care performed: No  .1-3 Lead EKG Interpretation  Performed by: Lucrezia Starch, MD Authorized by: Lucrezia Starch, MD     Interpretation: normal     ECG rate assessment: normal     Rhythm: sinus rhythm     Ectopy: none     Conduction: normal     The patient is on the cardiac monitor to evaluate for evidence of arrhythmia and/or significant heart rate changes.   MEDICATIONS ORDERED IN ED: Medications  vancomycin (VANCOREADY) IVPB 1500 mg/300 mL (1,500 mg Intravenous New Bag/Given 06/20/22 1758)    Followed by  vancomycin (VANCOCIN) IVPB 1000 mg/200 mL premix (has no administration in time range)  lactated ringers bolus 1,000 mL ( Intravenous Restarted 06/20/22 1756)  ceFEPIme (MAXIPIME) 2 g in sodium chloride 0.9 % 100 mL IVPB (0 g Intravenous Stopped 06/20/22 1755)     IMPRESSION / MDM / ASSESSMENT AND PLAN / ED COURSE  I reviewed the triage vital signs and the nursing notes. Patient's presentation is most consistent with acute presentation with potential threat to life or bodily function.                               Differential diagnosis includes, but is not limited to cellulitis, abscess and osteomyelitis.  Patient does not seem septic at this time.  Wound and blood culture sent in triage.  CMP is remarkable for BUN of 30, albumin of 3, AST of 193, ALT of 197 with normal bilirubin alk phos but no other significant electrolyte or metabolic derangements.  Glucose is 166.  Lactic acid is not elevated at 1.  CBC shows WC count 13.9, hemoglobin 11, 12 2 months ago and normal platelets.  ESR is elevated 115.  Unclear source of patient's transaminitis as he is adamant he does not take excessive Tylenol or alcohol.  Will obtain a right upper quadrant ultrasound and hepatitis panel.  Lactic acid 0.9.  Right upper quadrant ultrasound my interpretation shows gallstones but nondilated common  bile duct and otherwise normal-appearing liver.  I also reviewed radiologist interpretation and agree to findings of same.  X-ray of the left foot on my interpretation shows no acute fracture dislocation and previous amputation  of the first and second phalanges with thickening and chronic changes.  I do not see clear osteomyelitis.  I also reviewed radiology's interpretation.  Discussed with on-call podiatrist Dr. Caryl Comes who recommended hospitalist admission and initiating IV antibiotics and agreed with MRI to rule out osteomyelitis.  I discussed with admitting hospitalist will place admission orders.     FINAL CLINICAL IMPRESSION(S) / ED DIAGNOSES   Final diagnoses:  Wound infection  Transaminitis     Rx / DC Orders   ED Discharge Orders     None        Note:  This document was prepared using Dragon voice recognition software and may include unintentional dictation errors.   Lucrezia Starch, MD 06/20/22 [place]

## 2022-06-20 NOTE — ED Notes (Signed)
Pt in bed, pt has wound on the bottom of L foot, aprox dime sized, pt has quarter sized wound on to of foot, foot is swollen and has drainage, pt also has redness 4-5 inches up his shin.  Pt states that wound care sent him here for evaluation

## 2022-06-21 ENCOUNTER — Inpatient Hospital Stay: Payer: BC Managed Care – PPO | Admitting: Anesthesiology

## 2022-06-21 ENCOUNTER — Inpatient Hospital Stay: Payer: BC Managed Care – PPO

## 2022-06-21 ENCOUNTER — Encounter: Payer: Self-pay | Admitting: Internal Medicine

## 2022-06-21 ENCOUNTER — Encounter
Admission: EM | Disposition: A | Discharge status: Home / Self Care | Payer: Self-pay | Source: Home / Self Care | Attending: Internal Medicine

## 2022-06-21 DIAGNOSIS — E11628 Type 2 diabetes mellitus with other skin complications: Secondary | ICD-10-CM | POA: Diagnosis not present

## 2022-06-21 DIAGNOSIS — M86172 Other acute osteomyelitis, left ankle and foot: Secondary | ICD-10-CM | POA: Diagnosis present

## 2022-06-21 DIAGNOSIS — K219 Gastro-esophageal reflux disease without esophagitis: Secondary | ICD-10-CM | POA: Diagnosis present

## 2022-06-21 DIAGNOSIS — R7401 Elevation of levels of liver transaminase levels: Secondary | ICD-10-CM | POA: Diagnosis present

## 2022-06-21 DIAGNOSIS — L089 Local infection of the skin and subcutaneous tissue, unspecified: Secondary | ICD-10-CM | POA: Diagnosis not present

## 2022-06-21 HISTORY — PX: IRRIGATION AND DEBRIDEMENT ABSCESS: SHX5252

## 2022-06-21 HISTORY — PX: TRANSMETATARSAL AMPUTATION: SHX6197

## 2022-06-21 LAB — COMPREHENSIVE METABOLIC PANEL
ALT: 189 U/L — ABNORMAL HIGH (ref 0–44)
AST: 151 U/L — ABNORMAL HIGH (ref 15–41)
Albumin: 2.6 g/dL — ABNORMAL LOW (ref 3.5–5.0)
Alkaline Phosphatase: 72 U/L (ref 38–126)
Anion gap: 7 (ref 5–15)
BUN: 21 mg/dL — ABNORMAL HIGH (ref 6–20)
CO2: 25 mmol/L (ref 22–32)
Calcium: 8.2 mg/dL — ABNORMAL LOW (ref 8.9–10.3)
Chloride: 104 mmol/L (ref 98–111)
Creatinine, Ser: 0.86 mg/dL (ref 0.61–1.24)
GFR, Estimated: >60 mL/min (ref >60)
Glucose, Bld: 111 mg/dL — ABNORMAL HIGH (ref 70–99)
Potassium: 4 mmol/L (ref 3.5–5.1)
Sodium: 136 mmol/L (ref 135–145)
Total Bilirubin: 0.7 mg/dL (ref 0.3–1.2)
Total Protein: 6.9 g/dL (ref 6.5–8.1)

## 2022-06-21 LAB — GLUCOSE, CAPILLARY
Glucose-Capillary: 120 mg/dL — ABNORMAL HIGH (ref 70–99)
Glucose-Capillary: 126 mg/dL — ABNORMAL HIGH (ref 70–99)
Glucose-Capillary: 116 mg/dL — ABNORMAL HIGH (ref 70–99)
Glucose-Capillary: 101 mg/dL — ABNORMAL HIGH (ref 70–99)

## 2022-06-21 LAB — CBC
HCT: 30.7 % — ABNORMAL LOW (ref 39.0–52.0)
Hemoglobin: 10.2 g/dL — ABNORMAL LOW (ref 13.0–17.0)
MCH: 30.2 pg (ref 26.0–34.0)
MCHC: 33.2 g/dL (ref 30.0–36.0)
MCV: 90.8 fL (ref 80.0–100.0)
Platelets: 302 10*3/uL (ref 150–400)
RBC: 3.38 MIL/uL — ABNORMAL LOW (ref 4.22–5.81)
RDW: 12 % (ref 11.5–15.5)
WBC: 9.4 10*3/uL (ref 4.0–10.5)
nRBC: 0 % (ref 0.0–0.2)

## 2022-06-21 SURGERY — IRRIGATION AND DEBRIDEMENT ABSCESS
Anesthesia: General | Site: Foot | Laterality: Left

## 2022-06-21 MED ORDER — ONDANSETRON HCL 4 MG/2ML IJ SOLN
INTRAMUSCULAR | Status: DC | PRN
  Administered 2022-06-21: 4 mg via INTRAVENOUS

## 2022-06-21 MED ORDER — EPHEDRINE 5 MG/ML INJ
INTRAVENOUS | Status: AC
  Filled 2022-06-21: qty 5

## 2022-06-21 MED ORDER — 0.9 % SODIUM CHLORIDE (POUR BTL) OPTIME
TOPICAL | Status: DC | PRN
  Administered 2022-06-21: 500 mL

## 2022-06-21 MED ORDER — BUPIVACAINE HCL (PF) 0.5 % IJ SOLN
INTRAMUSCULAR | Status: AC
  Filled 2022-06-21: qty 30

## 2022-06-21 MED ORDER — FENTANYL CITRATE (PF) 100 MCG/2ML IJ SOLN: 25.0000 ug | INTRAMUSCULAR | Status: DC | PRN

## 2022-06-21 MED ORDER — SODIUM CHLORIDE 0.9 % IR SOLN
Status: DC | PRN
  Administered 2022-06-21: 3000 mL
  Administered 2022-06-21: 1000 mL

## 2022-06-21 MED ORDER — EPHEDRINE SULFATE (PRESSORS) 50 MG/ML IJ SOLN
INTRAMUSCULAR | Status: DC | PRN
  Administered 2022-06-21: 10 mg via INTRAVENOUS
  Administered 2022-06-21: 5 mg via INTRAVENOUS

## 2022-06-21 MED ORDER — LIDOCAINE HCL (PF) 2 % IJ SOLN
INTRAMUSCULAR | Status: AC
  Filled 2022-06-21: qty 5

## 2022-06-21 MED ORDER — ONDANSETRON HCL 4 MG/2ML IJ SOLN
INTRAMUSCULAR | Status: AC
  Filled 2022-06-21: qty 2

## 2022-06-21 MED ORDER — VANCOMYCIN HCL 1750 MG/350ML IV SOLN
1750.0000 mg | Freq: Two times a day (BID) | INTRAVENOUS | Status: DC
  Administered 2022-06-22: 1750 mg via INTRAVENOUS
  Filled 2022-06-21 (×3): qty 350

## 2022-06-21 MED ORDER — SODIUM CHLORIDE FLUSH 0.9 % IV SOLN
INTRAVENOUS | Status: AC
  Filled 2022-06-21: qty 20

## 2022-06-21 MED ORDER — OXYCODONE HCL 5 MG PO TABS
5.0000 mg | ORAL_TABLET | Freq: Once | ORAL | Status: AC
  Administered 2022-06-21: 5 mg via ORAL

## 2022-06-21 MED ORDER — SEVOFLURANE IN SOLN
RESPIRATORY_TRACT | Status: AC
  Filled 2022-06-21: qty 250

## 2022-06-21 MED ORDER — FENTANYL CITRATE (PF) 100 MCG/2ML IJ SOLN
INTRAMUSCULAR | Status: AC
  Filled 2022-06-21: qty 2

## 2022-06-21 MED ORDER — PROPOFOL 10 MG/ML IV BOLUS
INTRAVENOUS | Status: AC
  Filled 2022-06-21: qty 20

## 2022-06-21 MED ORDER — ACETAMINOPHEN 10 MG/ML IV SOLN
INTRAVENOUS | Status: AC
  Filled 2022-06-21: qty 100

## 2022-06-21 MED ORDER — MORPHINE SULFATE (PF) 2 MG/ML IV SOLN: 2.0000 mg | INTRAVENOUS | Status: DC | PRN

## 2022-06-21 MED ORDER — GADOBUTROL 1 MMOL/ML IV SOLN
10.0000 mL | Freq: Once | INTRAVENOUS | Status: AC | PRN
  Administered 2022-06-21: 10 mL via INTRAVENOUS

## 2022-06-21 MED ORDER — BUPIVACAINE HCL (PF) 0.5 % IJ SOLN
INTRAMUSCULAR | Status: DC | PRN
  Administered 2022-06-21: 20 mL

## 2022-06-21 MED ORDER — OXYCODONE-ACETAMINOPHEN 5-325 MG PO TABS
1.0000 | ORAL_TABLET | ORAL | Status: DC | PRN
  Administered 2022-06-22 – 2022-06-27 (×6): 2 via ORAL
  Filled 2022-06-21 (×6): qty 2

## 2022-06-21 MED ORDER — ACETAMINOPHEN 10 MG/ML IV SOLN
INTRAVENOUS | Status: DC | PRN
  Administered 2022-06-21: 1000 mg via INTRAVENOUS

## 2022-06-21 MED ORDER — PHENYLEPHRINE HCL (PRESSORS) 10 MG/ML IV SOLN
INTRAVENOUS | Status: DC | PRN
  Administered 2022-06-21 (×2): 80 ug via INTRAVENOUS
  Administered 2022-06-21 (×3): 160 ug via INTRAVENOUS

## 2022-06-21 MED ORDER — FENTANYL CITRATE (PF) 100 MCG/2ML IJ SOLN
INTRAMUSCULAR | Status: DC | PRN
  Administered 2022-06-21 (×4): 50 ug via INTRAVENOUS

## 2022-06-21 MED ORDER — PROPOFOL 10 MG/ML IV BOLUS
INTRAVENOUS | Status: DC | PRN
  Administered 2022-06-21 (×2): 50 mg via INTRAVENOUS
  Administered 2022-06-21: 150 mg via INTRAVENOUS

## 2022-06-21 MED ORDER — ONDANSETRON HCL 4 MG/2ML IJ SOLN: 4.0000 mg | Freq: Once | INTRAMUSCULAR | Status: DC | PRN

## 2022-06-21 MED ORDER — OXYCODONE HCL 5 MG PO TABS
ORAL_TABLET | ORAL | Status: AC
  Filled 2022-06-21: qty 1

## 2022-06-21 MED ORDER — MIDAZOLAM HCL 2 MG/2ML IJ SOLN
INTRAMUSCULAR | Status: AC
  Filled 2022-06-21: qty 2

## 2022-06-21 MED ORDER — MIDAZOLAM HCL 2 MG/2ML IJ SOLN
INTRAMUSCULAR | Status: DC | PRN
  Administered 2022-06-21: 2 mg via INTRAVENOUS

## 2022-06-21 MED ORDER — LIDOCAINE HCL (CARDIAC) PF 100 MG/5ML IV SOSY
PREFILLED_SYRINGE | INTRAVENOUS | Status: DC | PRN
  Administered 2022-06-21: 80 mg via INTRAVENOUS

## 2022-06-21 SURGICAL SUPPLY — 68 items
BLADE MED AGGRESSIVE (BLADE) ×2 IMPLANT
BLADE OSCILLATING/SAGITTAL (BLADE) ×1
BLADE SURG 15 STRL LF DISP TIS (BLADE) ×1 IMPLANT
BLADE SURG 15 STRL SS (BLADE) ×1
BLADE SURG MINI STRL (BLADE) IMPLANT
BLADE SW THK.38XMED LNG THN (BLADE) IMPLANT
BNDG COHESIVE 4X5 TAN ST LF (GAUZE/BANDAGES/DRESSINGS) ×3 IMPLANT
BNDG ELASTIC 4X5.8 VLCR NS LF (GAUZE/BANDAGES/DRESSINGS) ×2 IMPLANT
BNDG ESMARK 4X12 TAN STRL LF (GAUZE/BANDAGES/DRESSINGS) ×2 IMPLANT
BNDG GAUZE DERMACEA FLUFF (GAUZE/BANDAGES/DRESSINGS) ×1
BNDG GAUZE DERMACEA FLUFF 4 (GAUZE/BANDAGES/DRESSINGS) ×1 IMPLANT
CUFF TOURN SGL QUICK 12 (TOURNIQUET CUFF) IMPLANT
CUFF TOURN SGL QUICK 18X4 (TOURNIQUET CUFF) IMPLANT
DRAIN PENROSE 12X.25 LTX STRL (MISCELLANEOUS) ×1 IMPLANT
DRAPE FLUOR MINI C-ARM 54X84 (DRAPES) ×2 IMPLANT
DRSG GAUZE FLUFF 36X18 (GAUZE/BANDAGES/DRESSINGS) ×2 IMPLANT
DURAPREP 26ML APPLICATOR (WOUND CARE) ×2 IMPLANT
ELECT REM PT RETURN 9FT ADLT (ELECTROSURGICAL) ×2
ELECTRODE REM PT RTRN 9FT ADLT (ELECTROSURGICAL) ×1 IMPLANT
GAUZE SPONGE 4X4 12PLY STRL (GAUZE/BANDAGES/DRESSINGS) ×4 IMPLANT
GAUZE STRETCH 2X75IN STRL (MISCELLANEOUS) ×3 IMPLANT
GAUZE XEROFORM 1X8 LF (GAUZE/BANDAGES/DRESSINGS) ×3 IMPLANT
GLOVE BIO SURGEON STRL SZ7.5 (GLOVE) ×2 IMPLANT
GLOVE SURG UNDER LTX SZ8 (GLOVE) ×2 IMPLANT
GOWN STRL REUS W/ TWL LRG LVL3 (GOWN DISPOSABLE) ×2 IMPLANT
GOWN STRL REUS W/TWL LRG LVL3 (GOWN DISPOSABLE) ×2
HANDLE YANKAUER SUCT BULB TIP (MISCELLANEOUS) ×2 IMPLANT
HANDPIECE VERSAJET 45 DEG 14 (MISCELLANEOUS) ×1 IMPLANT
HANDPIECE VERSAJET DEBRIDEMENT (MISCELLANEOUS) ×2 IMPLANT
IV NS 1000ML (IV SOLUTION) ×1
IV NS 1000ML BAXH (IV SOLUTION) IMPLANT
IV NS IRRIG 3000ML ARTHROMATIC (IV SOLUTION) ×2 IMPLANT
KIT TURNOVER KIT A (KITS) ×2 IMPLANT
LABEL OR SOLS (LABEL) ×2 IMPLANT
MANIFOLD NEPTUNE II (INSTRUMENTS) ×2 IMPLANT
NDL FILTER BLUNT 18X1 1/2 (NEEDLE) ×1 IMPLANT
NDL HYPO 25X1 1.5 SAFETY (NEEDLE) ×3 IMPLANT
NDL SAFETY ECLIPSE 18X1.5 (NEEDLE) ×1 IMPLANT
NEEDLE FILTER BLUNT 18X 1/2SAF (NEEDLE)
NEEDLE FILTER BLUNT 18X1 1/2 (NEEDLE) IMPLANT
NEEDLE HYPO 18GX1.5 SHARP (NEEDLE)
NEEDLE HYPO 25X1 1.5 SAFETY (NEEDLE) ×4 IMPLANT
NS IRRIG 500ML POUR BTL (IV SOLUTION) ×2 IMPLANT
PACK EXTREMITY ARMC (MISCELLANEOUS) ×2 IMPLANT
PAD ABD DERMACEA PRESS 5X9 (GAUZE/BANDAGES/DRESSINGS) ×5 IMPLANT
PENCIL ELECTRO HAND CTR (MISCELLANEOUS) ×1 IMPLANT
PULSAVAC PLUS IRRIG FAN TIP (DISPOSABLE) ×2
RASP SM TEAR CROSS CUT (RASP) IMPLANT
SOL PREP PVP 2OZ (MISCELLANEOUS) ×2
SOLUTION PREP PVP 2OZ (MISCELLANEOUS) ×1 IMPLANT
SPONGE T-LAP 18X18 ~~LOC~~+RFID (SPONGE) ×2 IMPLANT
STAPLER SKIN PROX 35W (STAPLE) ×1 IMPLANT
STOCKINETTE STRL 6IN 960660 (GAUZE/BANDAGES/DRESSINGS) ×2 IMPLANT
STRAP SAFETY 5IN WIDE (MISCELLANEOUS) ×2 IMPLANT
SUT ETHILON 3-0 FS-10 30 BLK (SUTURE) ×4
SUT ETHILON 4-0 (SUTURE) ×2
SUT ETHILON 4-0 FS2 18XMFL BLK (SUTURE) ×2
SUT VIC AB 2-0 CT1 27 (SUTURE) ×2
SUT VIC AB 2-0 CT1 TAPERPNT 27 (SUTURE) IMPLANT
SUT VIC AB 3-0 SH 27 (SUTURE) ×1
SUT VIC AB 3-0 SH 27X BRD (SUTURE) ×1 IMPLANT
SUT VIC AB 4-0 FS2 27 (SUTURE) ×4 IMPLANT
SUTURE EHLN 3-0 FS-10 30 BLK (SUTURE) ×1 IMPLANT
SUTURE ETHLN 4-0 FS2 18XMF BLK (SUTURE) ×2 IMPLANT
SYR 10ML LL (SYRINGE) ×4 IMPLANT
SYR 3ML LL SCALE MARK (SYRINGE) ×1 IMPLANT
TIP FAN IRRIG PULSAVAC PLUS (DISPOSABLE) IMPLANT
WATER STERILE IRR 500ML POUR (IV SOLUTION) ×2 IMPLANT

## 2022-06-21 NOTE — Progress Notes (Signed)
  Transition of Care [facility]) Screening Note   Patient Details  Name: [name] Date of Birth: [birth year]   Transition of Care [facility]) CM/SW Contact:    Gildardo Griffes, LCSW Phone Number: 06/21/2022, 10:16 AM    Transition of Care Department [facility]) has reviewed patient and no TOC needs have been identified at this time. We will continue to monitor patient advancement through interdisciplinary progression rounds. If new patient transition needs arise, please place a TOC consult.  [place], [place] [phone]

## 2022-06-21 NOTE — Assessment & Plan Note (Addendum)
Lipitor initially held due to transaminitis. Repeat CMP tomorrow and resume if LFTs are further improved.

## 2022-06-21 NOTE — Progress Notes (Signed)
EDREES, VALENT ([id number]) Visit Report for 06/20/2022 Chief Complaint Document Details Patient Name: [name] [name] Date of Service: 06/20/2022 1:00 PM Medical Record Number: [id number] Patient Account Number: [id number] Date of Birth/Sex: 1961/08/31 (61 y.o. M) Treating RN: Huel Coventry Primary Care Provider: [name] Other Clinician: Referring Provider: Loura Pardon Treating Provider/Extender: Tilda Franco in Treatment: 0 Information Obtained from: Patient Chief Complaint 06/20/2022; left dorsal foot wound Electronic Signature(s) Signed: 06/20/2022 1:48:26 PM By: Geralyn Corwin DO Entered By: Geralyn Corwin on 06/20/2022 13:37:17 [name] [name] ([id number]) -------------------------------------------------------------------------------- HPI Details Patient Name: [name] [name] Date of Service: 06/20/2022 1:00 PM Medical Record Number: [id number] Patient Account Number: [id number] Date of Birth/Sex: 06-20-1961 (61 y.o. M) Treating RN: Huel Coventry Primary Care Provider: [name] Other Clinician: Referring Provider: Loura Pardon Treating Provider/Extender: Tilda Franco in Treatment: 0 History of Present Illness HPI Description: 06/10/2020 upon evaluation today patient appears to be doing poorly in regard to his right plantar foot, left great toe, and left second toe. Unfortunately he has significant wounds at these locations but especially in regard to the second toe which does have a lot of necrotic tissue over the distal portion of the toe. The great toe does have some necrotic tissue as well. With that being said the patient tells me that this has been going on for 1-2 months and seems to be getting worse especially in regard to the toe. He has not had any specific x-rays to identify obvious signs of osteomyelitis up to this point that something would likely get a different today as well. With that being said he also has not had any current arterial  studies which I think is something else that we probably will look into performing at this point. He does have a history of diabetes mellitus type 2, and diabetic neuropathy. Currently he has been using normal shoes for ambulation though he does have some kind of external device so that he does not have to have actual still toed boots anymore at work which he states has helped nonetheless we is at work he does have to have something that protects his feet as such per policy. He tells me he is having to do a lot more walking right now compared to normal but he can try to see if he can change something in that regard. 06/16/2020 upon evaluation today patient appears to be doing well all things considered with regard to his wounds. I do not feel like anything is it any worse and overall I feel like the Bactrim has done well. The culture that I reviewed did not reveal any specific organisms unfortunately that would help tailor treatment. I did actually review the x-ray as well it does appear that he has evidence of osteomyelitis of the distal tuft of the second toe left foot. That was discussed with him today. He likely will need a referral to infectious disease. 06/30/2020 on evaluation today patient appears to be doing well at this time in regard to his wounds all things considered. The erythema has improved he does have evidence of osteomyelitis again the second toe on the left foot but at the other sites there were no obvious signs. He does have wounds bilaterally on his feet and toes. These are to require sharp debridement today. He is still pending as far as his appointment with infectious disease. With that being said this is actually scheduled for 29 July which is this month. He also has an appointment on the 27th with vascular. Overall  at least we are getting to the point where I think we are getting something is taken care of here. 07/12/2020 patient did have his appointment with [facility] infectious  disease. They have placed him on medications which include Flagyl 500 mg and Levaquin 750 mg for the next several weeks until September 9. Other than that he overall seems to be looking quite well and I am very pleased with how things are progressing at this point. 07/21/2020 on evaluation today patient appears to be doing well with regard to his wounds. He is on Flagyl as well as Levaquin which is helping to take care of the infection it appears. Fortunately there is no signs of active infection systemically at this time which is also good news. I am very pleased with where things stand. 07/28/20 on evaluation today patient appears to be doing well with regard to his wounds. He has been tolerating the dressing changes without complication we've been using the alginate currently. With that being said I do feel like the patient is making good progress albeit slow. 08/04/2020 on evaluation today patient's wound actually is showing signs of good improvement which is great news and overall very pleased with where things stand. Especially in regard to the right foot. The left great toe unfortunately is still having a lot of drainage and is somewhat macerated. This actually does appear to go deeper and is actually showing signs of bone exposure in the base of the wound. Again he is on antibiotics and I think we could consider HBO therapy in fact this is something I may discuss with him next week depending on how the toe looks. With that being said in the short-term I think I am going to actually recommend that he change the dressing more frequently right has been doing every other day 08/16/2020 on evaluation today patient appears to be doing well with regard to his ulcers. He is going require some debridement of both sites remaining he still is having a lot of drainage from the toe but again this seems to be making some progress here just very slowly. I did discuss with him the possibility of hyperbarics today  but he really was not interested in that at this point. 08/25/2020 upon evaluation today patient's wounds appear to be doing okay at this point. He is progressing quite slowly at this time but nonetheless does seem to be making fairly good progress at this time which is good news. Fortunately there is no signs of active infection at this time. He does have an appointment with his infectious disease doctor next week on Thursday therefore he will not be able to make the appointment here we will likely see him in 2 weeks in that regard. 09/09/2020 upon evaluation today patient appears to be doing well currently in regard to his wounds. He still has openings at both locations although the great toe on the left does appear to be a little smaller to me which is good news. Fortunately there is no signs of active infection at this time which is excellent. 09/30/2020 on evaluation today patient appears to be doing well with regard to his left great toe ulcer this is measuring a little smaller. In regard to the right foot plantar unfortunately this is not really significantly improved. I think the biggest issue here is he continues to wear his work shoes and walks sufficiently on this to because it did not really be able to close as effectively as it needs to. We  previously discussed total contact casting but the patient states he cannot do this and work and he also can do this and drive which is essential to him. He tells me that he does not really have any time off and he is very concerned about the fact that he has to continue to work. Again this is a discussion we have had multiple times in the past as well. 10/13/2020 on evaluation today patient appears to be doing decently well in regard to his left great toe ulcer this is not as macerated mainly likely due to the fact that he is not working currently in his work boots for a large portion of his day. Unfortunately he had to have an amputation in regard to  the wound location on his right foot this got really bad very quickly. He ended up going to urgent care subsequently to the hospital and has since had the amputation. I did not view that today as it is wrapped up and he recently just saw his podiatrist in that regard. Fortunately there is no signs of active infection at this time which is great news. [name] [name] 10/20/2020 upon evaluation today patient appears to be doing actually very well in regard to his toe ulcer. He has been tolerating the dressing changes without complication. Fortunately there is no evidence of active infection which is great news and in general I been very pleased with how things seem to be progressing. No fevers, chills, nausea, vomiting, or diarrhea. 10/27/2020 on evaluation today patient's wound actually showing signs of good improvement. Fortunately there is no signs of active infection at this time. There does not appear to be any evidence of drainage significantly and I think that the biggest issue he is having is keeping his Band- Aid in place so that the wound does not dry out. Fortunately this is a better problem to have than it staying too wet like it was previous. I do believe we can manage this. 11/01/2020 upon evaluation today patient appears to be doing about the same in regard to his toe ulcer. I feel like this is still not really showing signs of the improvement that I would expect. I am the like to remove some of the callus around the edges of the wound today is much as I can. 11/15/2020 on evaluation today patient actually appears to be doing well with regard to his toe ulcer. I feel like he is making progress. It is taken some time to get this to heal of course and he continues to develop some callus we will would need to remove that today. Nonetheless I feel like he is making progress but me we want to try something little different I am thinking endoform would be a good option to see how  that we will do for him. Patient is in agreement with that plan. 11/29/2020 on evaluation today patient appears to be doing excellent in regard to his ankle ulcer. In fact this appears to be completely healed based on what I am seeing today. There is no evidence of active infection at this time. No fevers, chills, nausea, vomiting, or diarrhea. 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 He reports increased swelling to the left foot  over the past 1 to 2 weeks. He saw his primary care provider for this issue and was started on Keflex and mupirocin ointment. Over the past 24 hours he has developed a worsening wound to the dorsal aspect of the foot with thick serous sanguinous drainage. There is increased erythema and warmth to the foot. He currently denies fever/chills, nausea/vomiting. Electronic Signature(s) Signed: 06/20/2022 1:48:26 PM By: Kalman Shan DO Entered By: Kalman Shan on 06/20/2022 13:41:17 [name] [name] -------------------------------------------------------------------------------- Physical Exam Details Patient Name: [name] [name] Date of Service: 06/20/2022 1:00 PM Medical Record Number: [id number] Patient Account Number: [id number] Date of Birth/Sex: 11-Aug-1961 (61 y.o. M) Treating RN: Cornell Barman Primary Care Provider: Jon Billings Other Clinician: Referring Provider: Charlynne Cousins Treating Provider/Extender: Yaakov Guthrie in Treatment: 0 Constitutional . Cardiovascular . Psychiatric . Notes Left foot: Increased swelling, warmth and erythema to the foot with streaking up the extremity. Open wound on the dorsal aspect with purulent serosanguineous  drainage. Electronic Signature(s) Signed: 06/20/2022 1:48:26 PM By: Kalman Shan DO Entered By: Kalman Shan on 06/20/2022 13:42:56 [name] [name] -------------------------------------------------------------------------------- Physician Orders Details Patient Name: [name] [name] Date of Service: 06/20/2022 1:00 PM Medical Record Number: [id number] Patient Account Number: [id number] Date of Birth/Sex: [birth year] (61 y.o. M) Treating RN: Cornell Barman Primary Care Provider: Jon Billings Other Clinician: Referring Provider: Charlynne Cousins Treating Provider/Extender: Yaakov Guthrie in Treatment: 0 Verbal / Phone Orders: No Diagnosis Coding ICD-10 Coding Code Description E11.621 Type 2 diabetes mellitus with foot ulcer L97.522 Non-pressure chronic ulcer of other part of left foot with fat layer exposed E11.40 Type 2 diabetes mellitus with diabetic neuropathy, unspecified Discharge From [facility] Only - Patient instructed to go to ED for evaluation and treatment. Follow-up Appointments o Other: - as needed. Wound Treatment Electronic Signature(s) Signed: 06/20/2022 1:44:37 PM By: Gretta Cool, BSN, RN, CWS, Kim RN, BSN Signed: 06/20/2022 1:48:26 PM By: Kalman Shan DO Entered By: Gretta Cool BSN, RN, CWS, Kim on 06/20/2022 13:44:37 Vanengen, Daking ([id number]) -------------------------------------------------------------------------------- Problem List Details Patient Name: [name] [name] Date of Service: 06/20/2022 1:00 PM Medical Record Number: [id number] Patient Account Number: [id number] Date of Birth/Sex: 21-Dec-1960 (61 y.o. M) Treating RN: Cornell Barman Primary Care Provider: Jon Billings Other Clinician: Referring Provider: Charlynne Cousins Treating Provider/Extender: Yaakov Guthrie in Treatment: 0 Active Problems ICD-10 Encounter Code Description Active Date MDM Diagnosis E11.621 Type 2 diabetes mellitus with foot ulcer 06/20/2022  No Yes L97.522 Non-pressure chronic ulcer of other part of left foot with fat layer 06/20/2022 No Yes exposed E11.40 Type 2 diabetes mellitus with diabetic neuropathy, unspecified 06/20/2022 No Yes L03.116 Cellulitis of left lower limb 06/20/2022 No Yes Inactive Problems Resolved Problems Electronic Signature(s) Signed: 06/20/2022 1:48:26 PM By: Kalman Shan DO Entered By: Kalman Shan on 06/20/2022 13:47:19 Martelli, Milik ([id number]) -------------------------------------------------------------------------------- Progress Note Details Patient Name: [name] [name] Date of Service: 06/20/2022 1:00 PM Medical Record Number: [id number] Patient Account Number: [id number] Date of Birth/Sex: Jan 25, 1961 (61 y.o. M) Treating RN: Cornell Barman Primary Care Provider: Jon Billings Other Clinician: Referring Provider: Charlynne Cousins Treating Provider/Extender: Yaakov Guthrie in Treatment: 0 Subjective Chief Complaint Information obtained from Patient 06/20/2022; left dorsal foot wound History of Present Illness (HPI) 06/10/2020 upon evaluation today patient appears to be doing poorly in regard to his right plantar foot, left great toe, and left second toe. Unfortunately he has significant wounds at these locations but especially in regard to the second toe which does have a lot of necrotic tissue over the distal portion of  the toe. The great toe does have some necrotic tissue as well. With that being said the patient tells me that this has been going on for 1-2 months and seems to be getting worse especially in regard to the toe. He has not had any specific x-rays to identify obvious signs of osteomyelitis up to this point that something would likely get a different today as well. With that being said he also has not had any current arterial studies which I think is something else that we probably will look into performing at this point. He does have a history of diabetes mellitus type 2,  and diabetic neuropathy. Currently he has been using normal shoes for ambulation though he does have some kind of external device so that he does not have to have actual still toed boots anymore at work which he states has helped nonetheless we is at work he does have to have something that protects his feet as such per policy. He tells me he is having to do a lot more walking right now compared to normal but he can try to see if he can change something in that regard. 06/16/2020 upon evaluation today patient appears to be doing well all things considered with regard to his wounds. I do not feel like anything is it any worse and overall I feel like the Bactrim has done well. The culture that I reviewed did not reveal any specific organisms unfortunately that would help tailor treatment. I did actually review the x-ray as well it does appear that he has evidence of osteomyelitis of the distal tuft of the second toe left foot. That was discussed with him today. He likely will need a referral to infectious disease. 06/30/2020 on evaluation today patient appears to be doing well at this time in regard to his wounds all things considered. The erythema has improved he does have evidence of osteomyelitis again the second toe on the left foot but at the other sites there were no obvious signs. He does have wounds bilaterally on his feet and toes. These are to require sharp debridement today. He is still pending as far as his appointment with infectious disease. With that being said this is actually scheduled for 29 July which is this month. He also has an appointment on the 27th with vascular. Overall at least we are getting to the point where I think we are getting something is taken care of here. 07/12/2020 patient did have his appointment with [facility] infectious disease. They have placed him on medications which include Flagyl 500 mg and Levaquin 750 mg for the next several weeks until September 9. Other than that  he overall seems to be looking quite well and I am very pleased with how things are progressing at this point. 07/21/2020 on evaluation today patient appears to be doing well with regard to his wounds. He is on Flagyl as well as Levaquin which is helping to take care of the infection it appears. Fortunately there is no signs of active infection systemically at this time which is also good news. I am very pleased with where things stand. 07/28/20 on evaluation today patient appears to be doing well with regard to his wounds. He has been tolerating the dressing changes without complication we've been using the alginate currently. With that being said I do feel like the patient is making good progress albeit slow. 08/04/2020 on evaluation today patient's wound actually is showing signs of good improvement which is great news  and overall very pleased with where things stand. Especially in regard to the right foot. The left great toe unfortunately is still having a lot of drainage and is somewhat macerated. This actually does appear to go deeper and is actually showing signs of bone exposure in the base of the wound. Again he is on antibiotics and I think we could consider HBO therapy in fact this is something I may discuss with him next week depending on how the toe looks. With that being said in the short-term I think I am going to actually recommend that he change the dressing more frequently right has been doing every other day 08/16/2020 on evaluation today patient appears to be doing well with regard to his ulcers. He is going require some debridement of both sites remaining he still is having a lot of drainage from the toe but again this seems to be making some progress here just very slowly. I did discuss with him the possibility of hyperbarics today but he really was not interested in that at this point. 08/25/2020 upon evaluation today patient's wounds appear to be doing okay at this point. He is  progressing quite slowly at this time but nonetheless does seem to be making fairly good progress at this time which is good news. Fortunately there is no signs of active infection at this time. He does have an appointment with his infectious disease doctor next week on Thursday therefore he will not be able to make the appointment here we will likely see him in 2 weeks in that regard. 09/09/2020 upon evaluation today patient appears to be doing well currently in regard to his wounds. He still has openings at both locations although the great toe on the left does appear to be a little smaller to me which is good news. Fortunately there is no signs of active infection at this time which is excellent. 09/30/2020 on evaluation today patient appears to be doing well with regard to his left great toe ulcer this is measuring a little smaller. In regard to the right foot plantar unfortunately this is not really significantly improved. I think the biggest issue here is he continues to wear his work shoes and walks sufficiently on this to because it did not really be able to close as effectively as it needs to. We previously discussed total contact casting but the patient states he cannot do this and work and he also can do this and drive which is essential to him. He tells me that he does not really have any time off and he is very concerned about the fact that he has to continue to work. Again this is a discussion we have had multiple times in the past as well. [name] [name] ([id number]) 10/13/2020 on evaluation today patient appears to be doing decently well in regard to his left great toe ulcer this is not as macerated mainly likely due to the fact that he is not working currently in his work boots for a large portion of his day. 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 He ended up going to urgent care subsequently to the  hospital and has since had the amputation. I did not view that today as it is wrapped up and he recently just saw his podiatrist in that regard. Fortunately there is no signs of active infection at this time which is great news. 10/20/2020 upon evaluation  today patient appears to be doing actually very well in regard to his toe ulcer. He has been tolerating the dressing changes without complication. Fortunately there is no evidence of active infection which is great news and in general I been very pleased with how things seem to be progressing. No fevers, chills, nausea, vomiting, or diarrhea. 10/27/2020 on evaluation today patient's wound actually showing signs of good improvement. Fortunately there is no signs of active infection at this time. There does not appear to be any evidence of drainage significantly and I think that the biggest issue he is having is keeping his Band- Aid in place so that the wound does not dry out. Fortunately this is a better problem to have than it staying too wet like it was previous. I do believe we can manage this. 11/01/2020 upon evaluation today patient appears to be doing about the same in regard to his toe ulcer. I feel like this is still not really showing signs of the improvement that I would expect. I am the like to remove some of the callus around the edges of the wound today is much as I can. 11/15/2020 on evaluation today patient actually appears to be doing well with regard to his toe ulcer. I feel like he is making progress. It is taken some time to get this to heal of course and he continues to develop some callus we will would need to remove that today. Nonetheless I feel like he is making progress but me we want to try something little different I am thinking endoform would be a good option to see how that we will do for him. Patient is in agreement with that plan. 11/29/2020 on evaluation today patient appears to be doing excellent in regard to his  ankle ulcer. In fact this appears to be completely healed based on what I am seeing today. There is no evidence of active infection at this time. No fevers, chills, nausea, vomiting, or diarrhea. Readmission 06/20/2022 Mr. 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 He reports increased swelling to the left foot over the past 1 to 2 weeks. He saw his primary care provider for this issue and was started on Keflex and mupirocin ointment. Over the past 24 hours he has developed a worsening wound to the dorsal aspect of the foot with thick serous sanguinous drainage. There is increased erythema and warmth to the foot. He currently denies fever/chills, nausea/vomiting. Patient History Information obtained from Patient. Allergies No Known Drug Allergies Family History Diabetes - Maternal Grandparents, No family history of Stroke. Social History Never smoker, Marital Status - Single, Alcohol Use - Never, Drug Use - No History, Caffeine Use - Daily. Medical History Eyes Patient has history of Cataracts - right removed, left 7/15 Endocrine Patient has history of Type II Diabetes Musculoskeletal Patient has history of Osteoarthritis Neurologic Patient has history of Neuropathy Medical And Surgical History Notes Cardiovascular High Cholesterol Review of Systems (ROS) Respiratory Denies complaints or symptoms of Chronic or frequent coughs, Shortness of Breath. Integumentary (Skin) Complains or has symptoms of Wounds. Objective Constitutional [name] [name] ([id number]) Vitals Time Taken: 12:59 PM, Height: 71 in, Weight: 230 lbs, BMI: 32.1, Temperature: 98.2 F, Pulse: 98 bpm, Respiratory Rate: 16 breaths/min, Blood Pressure:  144/80 mmHg. General  Notes: Left foot: Increased swelling, warmth and erythema to the foot with streaking up the extremity. Open wound on the dorsal aspect with purulent serosanguineous drainage. Integumentary (Hair, Skin) Wound #4 status is Open. Original cause of wound was Gradually Appeared. The date acquired was: 06/19/2022. The wound is located on the Left,Distal,Dorsal Foot. The wound measures 0.2cm length x 0.2cm width x 3.2cm depth; 0.031cm^2 area and 0.101cm^3 volume. There is no tunneling or undermining noted. There is a large amount of serosanguineous drainage noted. There is no granulation within the wound bed. There is no necrotic tissue within the wound bed. 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 He has a wound on the dorsal aspect that has thick serosanguinous and purulent drainage. 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 At this time I recommended the patient go to the ED for further evaluation and likely admission for IV antibiotics and further imaging. I discussed he is at risk of losing his foot and leg if he does not seek immediate care. He expressed understanding and states he will drive over now to the ED to be evaluated. Plan Discharge From [facility] Services: Consult Only - Patient instructed to go to ED for evaluation and treatment. Follow-up Appointments: Other: - as needed. 1. Recommend patient go to the ED for further evaluation and likely admission for IV antibiotics and further imaging 2. Follow-up post discharge Electronic Signature(s) Signed: 06/20/2022 1:48:26 PM By: Kalman Shan DO Entered By:  Kalman Shan on 06/20/2022 13:47:32 [name] [name] -------------------------------------------------------------------------------- ROS/PFSH Details Patient Name: [name] [name] Date of Service: 06/20/2022 1:00 PM Medical Record Number: [id number] Patient Account Number: [id number] Date of Birth/Sex: 1961-05-01 (61 y.o. M) Treating RN: Cornell Barman Primary Care Provider: Jon Billings Other Clinician: Referring Provider: Charlynne Cousins Treating Provider/Extender: Yaakov Guthrie in Treatment: 0 Information Obtained From Patient Respiratory Complaints and Symptoms: Negative for: Chronic or frequent coughs; Shortness of Breath Integumentary (Skin) Complaints and Symptoms: Positive for: Wounds Eyes Medical History: Positive for: Cataracts - right removed, left 7/15 Cardiovascular Medical History: Past Medical History Notes: High Cholesterol Endocrine Medical History: Positive for: Type II Diabetes Time with diabetes: 19 years Treated with: Insulin, Oral agents Blood sugar tested every day: No Musculoskeletal Medical History: Positive for: Osteoarthritis Neurologic Medical History: Positive for: Neuropathy HBO Extended History Items Eyes: Cataracts Immunizations Pneumococcal Vaccine: Received Pneumococcal Vaccination: No Implantable Devices None Family and Social History [name] [name] Diabetes: Yes - Maternal Grandparents; Stroke: No; Never smoker; Marital Status - Single; Alcohol Use: Never; Drug Use: No History; Caffeine Use: Daily Electronic Signature(s) Signed: 06/20/2022 1:48:26 PM By: Kalman Shan DO Signed: 06/21/2022 8:02:47 AM By: Gretta Cool, BSN, RN, CWS, Kim RN, BSN Entered By: Gretta Cool, BSN, RN, CWS, Kim on 06/20/2022 13:03:55 [name] [name] -------------------------------------------------------------------------------- SuperBill Details Patient Name: [name] [name] Date of Service: 06/20/2022 Medical  Record Number: [id number] Patient Account Number: [id number] Date of Birth/Sex: July 14, 1961 (61 y.o. M) Treating RN: Cornell Barman Primary Care Provider: Jon Billings Other Clinician: Referring Provider: Charlynne Cousins Treating Provider/Extender: Yaakov Guthrie in Treatment: 0 Diagnosis Coding ICD-10 Codes Code Description E11.621 Type 2 diabetes mellitus with foot ulcer  [id number] Non-pressure chronic ulcer of other part of left foot with fat layer exposed E11.40 Type 2 diabetes mellitus with diabetic neuropathy, unspecified L03.116 Cellulitis of left lower limb Facility Procedures CPT4 Code: [id number] Description: 99214 - WOUND CARE VISIT-LEV 4 EST PT Modifier: Quantity: 1 Physician Procedures CPT4 Code: [id number] Description: [id number] - WC PHYS LEVEL 4 - EST PT Modifier: Quantity: 1 CPT4 Code: Description: ICD-10 Diagnosis Description E11.621 Type 2 diabetes mellitus with foot ulcer L97.522 Non-pressure chronic ulcer of other part of left foot with fat layer ex E11.40 Type 2 diabetes mellitus with diabetic neuropathy, unspecified L03.116  Cellulitis of left lower limb Modifier: posed Quantity: Electronic Signature(s) Signed: 06/20/2022 1:48:26 PM By: Kalman Shan DO Entered By: Kalman Shan on 06/20/2022 13:47:54

## 2022-06-21 NOTE — Progress Notes (Signed)
[name], [name] ([id number]) Visit Report for 06/20/2022 Abuse Risk Screen Details Patient Name: [name], [name] Date of Service: 06/20/2022 1:00 PM Medical Record Number: [id number] Patient Account Number: [id number] Date of Birth/Sex: [birth year] (61 y.o. M) Treating RN: Huel Coventry Primary Care Lenox Ladouceur: [name] Other Clinician: Referring Zakira Ressel: Loura Pardon Treating Anysia Choi/Extender: Tilda Franco in Treatment: 0 Abuse Risk Screen Items Answer ABUSE RISK SCREEN: Has anyone close to you tried to hurt or harm you recentlyo No Do you feel uncomfortable with anyone in your familyo No Has anyone forced you do things that you didnot want to doo No Electronic Signature(s) Signed: 06/21/2022 8:02:47 AM By: Elliot Gurney, BSN, RN, CWS, Kim RN, BSN Entered By: Elliot Gurney, BSN, RN, CWS, Kim on 06/20/2022 13:04:00 [name], [name] ([id number]) -------------------------------------------------------------------------------- Activities of Daily Living Details Patient Name: [name] Date of Service: 06/20/2022 1:00 PM Medical Record Number: [id number] Patient Account Number: [id number] Date of Birth/Sex: [birth year] (61 y.o. M) Treating RN: Huel Coventry Primary Care Keatin Benham: [name] Other Clinician: Referring Corvin Sorbo: Loura Pardon Treating Danaysha Kirn/Extender: Tilda Franco in Treatment: 0 Activities of Daily Living Items Answer Activities of Daily Living (Please select one for each item) Drive Automobile Completely Able Take Medications Completely Able Use Telephone Completely Able Care for Appearance Completely Able Use Toilet Completely Able Bath / Shower Completely Able Dress Self Completely Able Feed Self Completely Able Walk Completely Able Get In / Out Bed Completely Able Housework Completely Able Prepare Meals Completely Able Handle Money Completely Able Shop for Self Completely Able Electronic Signature(s) Signed: 06/21/2022 8:02:47 AM By: Elliot Gurney, BSN,  RN, CWS, Kim RN, BSN Entered By: Elliot Gurney, BSN, RN, CWS, Kim on 06/20/2022 13:04:14 [name], [name] ([id number]) -------------------------------------------------------------------------------- Education Screening Details Patient Name: [name] Date of Service: 06/20/2022 1:00 PM Medical Record Number: [id number] Patient Account Number: [id number] Date of Birth/Sex: Sep 23, 1961 (61 y.o. M) Treating RN: Huel Coventry Primary Care Carlisle Enke: [name] Other Clinician: Referring Kerem Gilmer: Loura Pardon Treating Angelys Yetman/Extender: Tilda Franco in Treatment: 0 Primary Learner Assessed: Patient Learning Preferences/Education Level/Primary Language Preferred Language: English Cognitive Barrier Language Barrier: No Translator Needed: No Memory Deficit: No Emotional Barrier: No Cultural/Religious Beliefs Affecting Medical Care: No Physical Barrier Impaired Vision: No Impaired Hearing: No Decreased Hand dexterity: No Knowledge/Comprehension Knowledge Level: High Comprehension Level: High Ability to understand written instructions: High Ability to understand verbal instructions: High Motivation Anxiety Level: Calm Cooperation: Cooperative Education Importance: Acknowledges Need Interest in Health Problems: Asks Questions Perception: Coherent Willingness to Engage in Self-Management High Activities: Readiness to Engage in Self-Management High Activities: Psychologist, prison and probation services) Signed: 06/21/2022 8:02:47 AM By: Elliot Gurney, BSN, RN, CWS, Kim RN, BSN Entered By: Elliot Gurney, BSN, RN, CWS, Kim on 06/20/2022 13:04:40 [name], [name] ([id number]) -------------------------------------------------------------------------------- Fall Risk Assessment Details Patient Name: [name] Date of Service: 06/20/2022 1:00 PM Medical Record Number: [id number] Patient Account Number: [id number] Date of Birth/Sex: May 05, 1961 (61 y.o. M) Treating RN: Huel Coventry Primary Care Nimrod Wendt:  [name] Other Clinician: Referring Niyla Marone: Loura Pardon Treating Ayyub Krall/Extender: Tilda Franco in Treatment: 0 Fall Risk Assessment Items Have you had 2 or more falls in the last 12 monthso 0 No Have you had any fall that resulted in injury in the last 12 monthso 0 No FALLS RISK SCREEN History of falling - immediate or within 3 months 0 No Secondary diagnosis (Do you have 2 or more medical diagnoseso) 0 No Ambulatory aid None/bed rest/wheelchair/nurse 0 Yes Crutches/cane/walker 0 No Furniture 0 No Intravenous therapy Access/Saline/Heparin Lock 0 No Gait/Transferring Normal/ bed rest/ wheelchair 0 Yes  Weak (short steps with or without shuffle, stooped but able to lift head while walking, may 0 No seek support from furniture) Impaired (short steps with shuffle, may have difficulty arising from chair, head down, impaired 0 No balance) Mental Status Oriented to own ability 0 Yes Electronic Signature(s) Signed: 06/21/2022 8:02:47 AM By: Elliot Gurney, BSN, RN, CWS, Kim RN, BSN Entered By: Elliot Gurney, BSN, RN, CWS, Kim on 06/20/2022 13:04:49 [name], [name] ([id number]) -------------------------------------------------------------------------------- Foot Assessment Details Patient Name: [name] Date of Service: 06/20/2022 1:00 PM Medical Record Number: [id number] Patient Account Number: [id number] Date of Birth/Sex: 1961/02/20 (61 y.o. M) Treating RN: Huel Coventry Primary Care Jearlene Bridwell: [name] Other Clinician: Referring Eschol Auxier: Loura Pardon Treating [name]/Extender: Tilda Franco in Treatment: 0 Foot Assessment Items Site Locations + = Sensation present, - = Sensation absent, C = Callus, U = Ulcer R = Redness, W = Warmth, M = Maceration, PU = Pre-ulcerative lesion F = Fissure, S = Swelling, D = Dryness Assessment Right: Left: Other Deformity: No No Prior Foot Ulcer: No Yes Prior Amputation: Yes Yes Charcot Joint: No No Ambulatory Status:  Ambulatory Without Help Gait: Steady Electronic Signature(s) Signed: 06/21/2022 8:02:47 AM By: Elliot Gurney, BSN, RN, CWS, Kim RN, BSN Entered By: Elliot Gurney, BSN, RN, CWS, Kim on 06/20/2022 13:06:01 [name], [name] ([id number]) -------------------------------------------------------------------------------- Nutrition Risk Screening Details Patient Name: [name] Date of Service: 06/20/2022 1:00 PM Medical Record Number: [id number] Patient Account Number: [id number] Date of Birth/Sex: [birth year] (61 y.o. M) Treating RN: Huel Coventry Primary Care Jovie Swanner: [name] Other Clinician: Referring Ashunti Schofield: Loura Pardon Treating [name]/Extender: Tilda Franco in Treatment: 0 Height (in): 71 Weight (lbs): 230 Body Mass Index (BMI): 32.1 Nutrition Risk Screening Items Score Screening NUTRITION RISK SCREEN: I have an illness or condition that made me change the kind and/or amount of food I eat 0 No I eat fewer than two meals per day 0 No I eat few fruits and vegetables, or milk products 2 Yes I have three or more drinks of beer, liquor or wine almost every day 0 No I have tooth or mouth problems that make it hard for me to eat 0 No I don't always have enough money to buy the food I need 0 No I eat alone most of the time 0 No I take three or more different prescribed or over-the-counter drugs a day 1 Yes Without wanting to, I have lost or gained 10 pounds in the last six months 0 No I am not always physically able to shop, cook and/or feed myself 0 No Nutrition Protocols Good Risk Protocol Provide education on elevated Moderate Risk Protocol 0 blood sugars and impact on wound healing, as applicable High Risk Proctocol Risk Level: Moderate Risk Score: 3 Electronic Signature(s) Signed: 06/21/2022 8:02:47 AM By: Elliot Gurney, BSN, RN, CWS, Kim RN, BSN Entered By: Elliot Gurney, BSN, RN, CWS, Kim on 06/20/2022 13:05:13

## 2022-06-21 NOTE — Anesthesia Preprocedure Evaluation (Signed)
Anesthesia Evaluation  Patient identified by MRN, date of birth, ID band Patient awake    Reviewed: Allergy & Precautions, H&P , NPO status , Patient's Chart, lab work & pertinent test results, reviewed documented beta blocker date and time   Airway Mallampati: II  TM Distance: >3 FB Neck ROM: full    Dental  (+) Teeth Intact   Pulmonary neg pulmonary ROS,    Pulmonary exam normal        Cardiovascular Exercise Tolerance: Good + Peripheral Vascular Disease  Normal cardiovascular exam Rate:Normal     Neuro/Psych negative neurological ROS  negative psych ROS   GI/Hepatic Neg liver ROS, GERD  Medicated,  Endo/Other  negative endocrine ROSdiabetes, Well Controlled, Type 2, Oral Hypoglycemic Agents  Renal/GU negative Renal ROS  negative genitourinary   Musculoskeletal   Abdominal   Peds  Hematology negative hematology ROS (+)   Anesthesia Other Findings   Reproductive/Obstetrics negative OB ROS                             Anesthesia Physical Anesthesia Plan  ASA: 3 and emergent  Anesthesia Plan: General LMA   Post-op Pain Management:    Induction:   PONV Risk Score and Plan: 3  Airway Management Planned:   Additional Equipment:   Intra-op Plan:   Post-operative Plan:   Informed Consent: I have reviewed the patients History and Physical, chart, labs and discussed the procedure including the risks, benefits and alternatives for the proposed anesthesia with the patient or authorized representative who has indicated his/her understanding and acceptance.       Plan Discussed with: CRNA  Anesthesia Plan Comments:         Anesthesia Quick Evaluation

## 2022-06-21 NOTE — Consult Note (Signed)
Pharmacy Antibiotic Note  [name] is a 61 y.o. male admitted on 06/20/2022 with  DFI / concern for osteomyelitis .  Pharmacy has been consulted for vancomycin dosing. Patient is also ordered cefepime.  Plan:  Vancomycin 1.75 g IV q12h --Calculated AUC: 521, Cmin 14.5 --BMI 31; Used Vd 0.72 --Daily Scr per protocol --Levels at steady state or as clinically indicated  Height: 6' (182.9 cm) Weight: 105.2 kg (231 lb 14.8 oz) IBW/kg (Calculated) : 77.6  Temp (24hrs), Avg:98.6 F (37 C), Min:98 F (36.7 C), Max:98.9 F (37.2 C)  Recent Labs  Lab 06/20/22 1426 06/20/22 1658 06/21/22 0526  WBC 13.9*  --  9.4  CREATININE 1.18  --  0.86  LATICACIDVEN 1.0 0.9  --      Estimated Creatinine Clearance: 115.9 mL/min (by C-G formula based on SCr of 0.86 mg/dL).    No Known Allergies  Antimicrobials this admission: Cefepime 7/12 >>  Vancomycin 7/12 >>   Dose adjustments this admission: Adjusting from Vanc 1250mg  Q12 to 1750mg  Q12  Microbiology results: 7/12 BCx: NGTD 7/12 MRSA PCR: (-)  Thank you for allowing pharmacy to be a part of this patient's care.  Cenia Zaragosa A Celita Aron 06/21/2022 1:21 PM

## 2022-06-21 NOTE — Anesthesia Procedure Notes (Signed)
Procedure Name: LMA Insertion Date/Time: 06/21/2022 6:13 PM  Performed by: Henrietta Hoover, CRNAPre-anesthesia Checklist: Patient identified, Suction available, Patient being monitored and Emergency Drugs available Patient Re-evaluated:Patient Re-evaluated prior to induction Oxygen Delivery Method: Circle system utilized Preoxygenation: Pre-oxygenation with 100% oxygen Induction Type: IV induction Ventilation: Mask ventilation without difficulty LMA: LMA inserted LMA Size: 5.0 Number of attempts: 1 Placement Confirmation: positive ETCO2 and breath sounds checked- equal and bilateral Tube secured with: Tape Dental Injury: Teeth and Oropharynx as per pre-operative assessment

## 2022-06-21 NOTE — Progress Notes (Signed)
ARUSH, [name] Visit Report for 06/20/2022 Allergy List Details Patient Name: [name], [name] Date of Service: 06/20/2022 1:00 PM Medical Record Number: [id number] Patient Account Number: [id number] Date of Birth/Sex: Apr 28, 1961 (61 y.o. M) [name] Primary Care [name]: [name] Other Clinician: Referring [name]: [name] Treating Sovereign Ramiro/Extender: [name] in Treatment: 0 Allergies Active Allergies No Known Drug Allergies Allergy Notes Electronic Signature(s) Signed: 06/21/2022 8:02:47 AM By: [name], BSN, RN, CWS, Kim RN, BSN Entered By: [name] on 06/20/2022 13:01:03 [name] -------------------------------------------------------------------------------- Arrival Information Details Patient Name: [name] Date of Service: 06/20/2022 1:00 PM Medical Record Number: [id number] Patient Account Number: [id number] Date of Birth/Sex: [birth year] (61 y.o. M) [name] Primary Care Morris [name]: [name] Other Clinician: Referring [name]: [name] Treating [name]/Extender: [name] in Treatment: 0 Visit Information Patient Arrived: Ambulatory Arrival Time: 12:58 Transfer Assistance: None Patient Identification Verified: Yes Secondary Verification Process Completed: Yes Patient Requires Transmission-Based Precautions: No Patient Has Alerts: Yes Patient Alerts: Type II Diabetic Aspirin 325 History Since Last Visit Electronic Signature(s) Signed: 06/21/2022 8:02:47 AM By: [name], BSN, RN, CWS, Kim RN, BSN Entered By: [name] on 06/20/2022 12:59:19 [name], [name] -------------------------------------------------------------------------------- Clinic Level of Care Assessment Details Patient Name: [name] Date of Service: 06/20/2022 1:00 PM Medical Record Number: [id number] Patient Account Number: [id number] Date of  Birth/Sex: 1961/02/27 (61 y.o. M) [name] Primary Care Gara Kincade: [name] Other Clinician: Referring [name]: [name] Treating [name]/Extender: [name] in Treatment: 0 Clinic Level of Care Assessment Items TOOL 2 Quantity Score []  - Use when only an EandM is performed on the INITIAL visit 0 ASSESSMENTS - Nursing Assessment / Reassessment X - General Physical Exam (combine w/ comprehensive assessment (listed just below) when performed on new 1 20 pt. evals) X- 1 25 Comprehensive Assessment (HX, ROS, Risk Assessments, Wounds Hx, etc.) ASSESSMENTS - Wound and Skin Assessment / Reassessment X - Simple Wound Assessment / Reassessment - one wound 1 5 []  - 0 Complex Wound Assessment / Reassessment - multiple wounds []  - 0 Dermatologic / Skin Assessment (not related to wound area) ASSESSMENTS - Ostomy and/or Continence Assessment and Care []  - Incontinence Assessment and Management 0 []  - 0 Ostomy Care Assessment and Management (repouching, etc.) PROCESS - Coordination of Care X - Simple Patient / Family Education for ongoing care 1 15 []  - 0 Complex (extensive) Patient / Family Education for ongoing care []  - 0 Staff obtains Programmer, systems, Records, Test Results / Process Orders []  - 0 Staff telephones HHA, Nursing Homes / Clarify orders / etc []  - 0 Routine Transfer to another Facility (non-emergent condition) []  - 0 Routine Hospital Admission (non-emergent condition) X- 1 15 New Admissions / Biomedical engineer / Ordering NPWT, Apligraf, etc. []  - 0 Emergency Hospital Admission (emergent condition) X- 1 10 Simple Discharge Coordination []  - 0 Complex (extensive) Discharge Coordination PROCESS - Special Needs []  - Pediatric / Minor Patient Management 0 []  - 0 Isolation Patient Management []  - 0 Hearing / Language / Visual special needs []  - 0 Assessment of Community assistance (transportation, D/C planning, etc.) []  -  0 Additional assistance / Altered mentation []  - 0 Support Surface(s) Assessment (bed, cushion, seat, etc.) INTERVENTIONS - Wound Cleansing / Measurement X - Wound Imaging (photographs - any number of wounds) 1 5 []  - 0 Wound Tracing (instead of photographs) X- 1 5 Simple Wound Measurement - one wound []  -  0 Complex Wound Measurement - multiple wounds [name] X- 1 5 Simple Wound Cleansing - one wound []  - 0 Complex Wound Cleansing - multiple wounds INTERVENTIONS - Wound Dressings X - Small Wound Dressing one or multiple wounds 1 10 []  - 0 Medium Wound Dressing one or multiple wounds []  - 0 Large Wound Dressing one or multiple wounds []  - 0 Application of Medications - injection INTERVENTIONS - Miscellaneous []  - External ear exam 0 X- 1 5 Specimen Collection (cultures, biopsies, blood, body fluids, etc.) X- 1 5 Specimen(s) / Culture(s) sent or taken to Lab for analysis []  - 0 Patient Transfer (multiple staff / Lift / Similar devices) []  - 0 Simple Staple / Suture removal (25 or less) []  - 0 Complex Staple / Suture removal (26 or more) []  - 0 Hypo / Hyperglycemic Management (close monitor of Blood Glucose) X- 1 15 Ankle / Brachial Index (ABI) - do not check if billed separately Has the patient been seen at the hospital within the last three years: Yes Total Score: 140 Level Of Care: New/Established - Level 4 Electronic Signature(s) Signed: 06/21/2022 8:02:47 AM By: , BSN, RN, CWS, Kim RN, BSN Entered By: , BSN, RN, CWS, [name] on 06/20/2022 13:45:18 [name], [name] ( ) -------------------------------------------------------------------------------- [name] Date of Service: 06/20/2022 1:00 PM Medical Record Number: Patient Account Number: Date of Birth/Sex: November 07, 1961 (61 y.o. M) [name] Primary Care Casy Tavano: 08/21/2022  Other Clinician: Referring Konnor Vondrasek: [id number] [name] in Treatment: 0 Encounter Discharge Information Items Discharge Condition: Stable Ambulatory Status: Ambulatory Discharge Destination: Hospital Schedule Follow-up Appointment: No Clinical Summary of Care: Notes Quaran Kedzierski told patient to go to the ED for evaluation and treatment of wound. Electronic Signature(s) Signed: 06/20/2022 1:55:07 PM By: [id number], BSN, RN, CWS, Kim RN, BSN Entered By: [id number], BSN, RN, CWS, [name] on 06/20/2022 13:55:07 Ribeiro, Kyston (11-16-1981) -------------------------------------------------------------------------------- Lower Extremity Assessment Details Patient Name: [name] Date of Service: 06/20/2022 1:00 PM Medical Record Number: Loura Pardon Patient Account Number: Tilda Franco Date of Birth/Sex: 15-Mar-1961 (61 y.o. M) [name] Primary Care Jackelyn Illingworth: 08/21/2022 Other Clinician: Referring Alpa Salvo: [id number] Treating Jalayiah Bibian/Extender: [name] in Treatment: 0 Edema Assessment Assessed: [Left: Yes] [Right: No] Edema: [Left: Ye] [Right: s] Calf Left: Right: Point of Measurement: 30 cm From Medial Instep 38.7 cm Ankle Left: Right: Point of Measurement: 10 cm From Medial Instep 29 cm Vascular Assessment Pulses: Dorsalis Pedis Palpable: [Left:Yes] Posterior Tibial Palpable: [Left:Yes] Blood Pressure: Brachial: [Left:150] Ankle: [Left:Dorsalis Pedis: 174 1.16] [Right:Dorsalis Pedis: 183 1.22] Electronic Signature(s) Signed: 06/21/2022 8:02:47 AM By: [id number], BSN, RN, CWS, Kim RN, BSN Entered By: [id number], BSN, RN, CWS, [name] on 06/20/2022 13:24:31 Lahm, Daquon (11-16-1981) -------------------------------------------------------------------------------- Multi Wound Chart Details Patient Name: [name] Date of Service: 06/20/2022 1:00 PM Medical Record Number: Loura Pardon Patient Account Number:  Tilda Franco Date of Birth/Sex: Dec 26, 1960 (61 y.o. M) [name] Primary Care Elihu Milstein: 08/21/2022 Other Clinician: Referring Adraine Biffle: [id number] Treating Bj Morlock/Extender: [name] in Treatment: 0 Vital Signs Height(in): 71 Pulse(bpm): 98 Weight(lbs): 230 Blood Pressure(mmHg): 144/80 Body Mass Index(BMI): 32.1 Temperature(F): 98.2 Respiratory Rate(breaths/min): 16 Photos: [N/A:N/A] Wound Location: Left, Distal, Dorsal Foot N/A N/A Wounding Event: Gradually Appeared N/A N/A Primary Etiology: Diabetic Wound/Ulcer of the Lower N/A N/A Extremity Comorbid History: Cataracts, Type II Diabetes, N/A N/A Osteoarthritis, Neuropathy Date Acquired: 06/19/2022 N/A N/A Weeks of Treatment: 0 N/A N/A Wound Status: Open  N/A N/A Wound Recurrence: No N/A N/A Pending Amputation on Yes N/A N/A Presentation: Measurements L x W x D (cm) 0.2x0.2x3.2 N/A N/A Area (cm) : 0.031 N/A N/A Volume (cm) : 0.101 N/A N/A Classification: Unable to visualize wound bed N/A N/A Exudate Amount: Large N/A N/A Exudate Type: Serosanguineous N/A N/A Exudate Color: red, brown N/A N/A Granulation Amount: None Present (0%) N/A N/A Necrotic Amount: None Present (0%) N/A N/A Exposed Structures: Fascia: No N/A N/A Fat Layer (Subcutaneous Tissue): No Tendon: No Muscle: No Joint: No Bone: No Epithelialization: None N/A N/A Treatment Notes Electronic Signature(s) Signed: 06/20/2022 1:43:38 PM By: [name], BSN, RN, CWS, Kim RN, BSN Entered By: [name] on 06/20/2022 13:43:37 [name], [name] ([id number]) -------------------------------------------------------------------------------- Multi-Disciplinary Care Plan Details Patient Name: [name] Date of Service: 06/20/2022 1:00 PM Medical Record Number: [id number] Patient Account Number: [id number] Date of Birth/Sex: 09/30/1961 (61 y.o. M) [name] Primary Care Mccall Will: [name] Other  Clinician: Referring Attikus Bartoszek: [name] Treating Arrietty Dercole/Extender: [name] in Treatment: 0 Active Inactive Electronic Signature(s) Signed: 06/20/2022 1:43:27 PM By: [name], BSN, RN, CWS, Kim RN, BSN Entered By: [name] on 06/20/2022 13:43:27 [name], [name] ([id number]) -------------------------------------------------------------------------------- Pain Assessment Details Patient Name: [name] Date of Service: 06/20/2022 1:00 PM Medical Record Number: [id number] Patient Account Number: [id number] Date of Birth/Sex: 09/25/1961 (61 y.o. M) [name] Primary Care Kiyana Vazguez: [name] Other Clinician: Referring Anyra Kaufman: [name] Treating Doniesha Landau/Extender: [name] in Treatment: 0 Active Problems Location of Pain Severity and Description of Pain Patient Has Paino No Site Locations Pain Management and Medication Current Pain Management: Electronic Signature(s) Signed: 06/21/2022 8:02:47 AM By: [name], BSN, RN, CWS, Kim RN, BSN Entered By: [name] on 06/20/2022 12:59:25 [name], [name] ([id number]) -------------------------------------------------------------------------------- Patient/Caregiver Education Details Patient Name: [name] Date of Service: 06/20/2022 1:00 PM Medical Record Number: [id number] Patient Account Number: [id number] Date of Birth/Gender: [birth year] (61 y.o. M) [name] Primary Care Physician: [name] Other Clinician: Referring Physician: Charlynne [name] Treating Physician/Extender: [name] in Treatment: 0 Education Assessment Education Provided To: Patient Education Topics Provided Notes Patient instructed to go to ED for evaluation and treatment. Electronic Signature(s) Signed: 06/21/2022 8:02:47 AM By: [name], BSN, RN, CWS, Kim RN, BSN Entered By: [name] on 06/20/2022 13:53:46 [name], [name]  ([id number]) -------------------------------------------------------------------------------- Wound Assessment Details Patient Name: [name] Date of Service: 06/20/2022 1:00 PM Medical Record Number: [id number] Patient Account Number: [id number] Date of Birth/Sex: Jul 31, 1961 (61 y.o. M) [name] Primary Care Kamiya Acord: [name] Other Clinician: Referring [employer] Null: [name] Treating [name]/Extender: [name] in Treatment: 0 Wound Status Wound Number: 4 Primary Etiology: Diabetic Wound/Ulcer of the Lower Extremity Wound Location: Left, Distal, Dorsal Foot Wound Status: Open Wounding Event: Gradually Appeared Comorbid Cataracts, Type II Diabetes, Osteoarthritis, History: Neuropathy Date Acquired: 06/19/2022 Weeks Of Treatment: 0 Clustered Wound: No Pending Amputation On Presentation Photos Wound Measurements Length: (cm) 0.2 Width: (cm) 0.2 Depth: (cm) 3.2 Area: (cm) 0.031 Volume: (cm) 0.101 % Reduction in Area: 0% % Reduction in Volume: 0% Epithelialization: None Tunneling: No Undermining: No Wound Description Classification: Unable to visualize wound bed Exudate Amount: Large Exudate Type: Serosanguineous Exudate Color: red, brown Foul Odor After Cleansing: No Slough/Fibrino No Wound Bed Granulation Amount: None Present (0%) Exposed Structure Necrotic Amount: None Present (0%) Fascia Exposed: No Fat Layer (Subcutaneous Tissue) Exposed: No Tendon Exposed: No Muscle Exposed: No Joint  Exposed: No Bone Exposed: No Assessment Notes Patient's foot was red and swollen with a thick red and brown fluid coming from the wound. Treatment Notes Wound #4 (Foot) Wound Laterality: Dorsal, Left, Distal Cleanser [name], [name] ([id number]) Peri-Wound Care Topical Primary Dressing Secondary Dressing Secured With Compression Wrap Compression Stockings Add-Ons Electronic Signature(s) Signed: 06/20/2022 2:00:08 PM By: [name],  BSN, RN, CWS, Kim RN, BSN Entered By: [name], BSN, RN, CWS, [name] on 06/20/2022 14:00:08 [name], [name] ([id number]) -------------------------------------------------------------------------------- Vitals Details Patient Name: [name] Date of Service: 06/20/2022 1:00 PM Medical Record Number: [id number] Patient Account Number: [id number] Date of Birth/Sex: [birth year] (61 y.o. M) [name] Primary Care Domnique Vanegas: [name] Other Clinician: Referring Jaleia Hanke: Loura Pardon Treating Jamon Hayhurst/Extender: Tilda Franco in Treatment: 0 Vital Signs Time Taken: 12:59 Temperature (F): 98.2 Height (in): 71 Pulse (bpm): 98 Weight (lbs): 230 Respiratory Rate (breaths/min): 16 Body Mass Index (BMI): 32.1 Blood Pressure (mmHg): 144/80 Reference Range: 80 - 120 mg / dl Electronic Signature(s) Signed: 06/21/2022 8:02:47 AM By: [name], BSN, RN, CWS, Kim RN, BSN Entered By: [name], BSN, RN, CWS, [name] on 06/20/2022 13:00:24

## 2022-06-21 NOTE — Transfer of Care (Signed)
Immediate Anesthesia Transfer of Care Note  Patient: [name]  Procedure(s) Performed: IRRIGATION AND DEBRIDEMENT ABSCESS (Left: Foot) TRANSMETATARSAL AMPUTATION-1st and 2nd (Left: Foot)  Patient Location: PACU  Anesthesia Type:General  Level of Consciousness: sedated  Airway & Oxygen Therapy: Patient Spontanous Breathing and Patient connected to face mask oxygen  Post-op Assessment: Report given to RN and Post -op Vital signs reviewed and stable  Post vital signs: Reviewed  Last Vitals:  Vitals Value Taken Time  BP    Temp    Pulse    Resp    SpO2      Last Pain:  Vitals:   06/21/22 1611  TempSrc: Temporal  PainSc:       Patients Stated Pain Goal: 0 (06/21/22 1611)  Complications: No notable events documented.

## 2022-06-21 NOTE — Op Note (Signed)
Date of operation: 06/21/2022.  Surgeon: Ricci Barker D.P.M.  Preoperative diagnosis: 1.  Osteomyelitis left first and second metatarsals. 2.  Abscess left dorsal midfoot.  Postoperative diagnosis: Same.  Procedures: 1.  Transmetatarsal amputation left foot. 2.  I&D deep abscess left foot.  Anesthesia: LMA.  Hemostasis: Pneumatic tourniquet left calf 250 mmHg.  Estimated blood loss: 15 cc.  Cultures: Bone culture left first and second metatarsals.  Deep tissue culture left midfoot.  Pathology: Left forefoot.  Injectables: 20 cc 0.5% Marcaine plain.  Complications: None apparent.  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  Recently developed increasing swelling and issues with the left foot a few weeks ago.  Presented to the emergency department for evaluation and was diagnosed with osteomyelitis and abscesses.  Operative procedure: Patient was taken to the operating room and placed on the table in the supine position.  Following satisfactory LMA anesthesia a pneumatic tourniquet was applied at the level of the left calf and the foot was prepped and draped in the usual sterile fashion.  The foot was exsanguinated using an Esmarch and the tourniquet inflated to 250 mmHg.  Attention was directed to the left forefoot where an incision was made from medial to lateral across the forefoot just proximal to the toes and carried sharply down to the level of bone creating a full-thickness flap.  Dissection carried back proximally along the metatarsal shaft using an elevator creating a free flap.  Similar incision was then made plantarly from medial to lateral across the forefoot indicated and carried sharply full-thickness creating a full-thickness flap plantarly.  Dissection carried along the plantar aspect of the metatarsals using an elevator and at this point the first and second metatarsals were  disarticulated at the cuneiform joints with the first metatarsal removed in toto.  Osteotomy was then created at the bases of metatarsals 3, 4, and 5 completing a transmetatarsal amputation and the forefoot was removed from the field in toto.  Bone sample was taken from the first and second metatarsals and sent separately for bone cultures.  Some devitalized tissue from the chronic osteomyelitis and ulcerative areas in the forefoot was noted and the flaps were thoroughly debrided with a Versajet debrider on a setting of 4 revealing good healthy tissues.  The wound was then thoroughly irrigated using pulsed irrigation saline.  Deep tissues were then sutured using 2-0 Vicryl simple interrupted sutures followed by superficial tissues using 3-0 Vicryl simple interrupted sutures followed by skin closure using staples.     Attention was then directed to the dorsum of the foot where MRI revealed a deep abscess.  Linear incision was made over the dorsal medial midfoot and deepened via sharp and blunt dissection down through the deep fascia where a large purulent abscess was exposed with significant drainage.  Culture was taken for sensitivities.  The deep cavity was then debrided with a Versajet debrider on a setting of 2 and then thoroughly irrigated with pulsed irrigation.  The incision was then partially closed using 3-0 nylon simple interrupted sutures leaving a central area open for packing which was packed with saline soaked gauze.  Xeroform applied to the transmetatarsal amputation site followed by 4 x 4's fluffs ABDs and Kerlix.  Tourniquet was released on the left foot and a second Kerlix and Ace wrap applied for compression.  Patient was awakened and transported the PACU having tolerated the anesthesia and procedures well.

## 2022-06-21 NOTE — Assessment & Plan Note (Signed)
Continue PPI ?

## 2022-06-21 NOTE — Assessment & Plan Note (Signed)
Well-controlled with last A1c 6.2%.  Hold oral medications.  Cover with sliding scale NovoLog for now

## 2022-06-21 NOTE — Consult Note (Signed)
Reason for Consult: Osteomyelitis with abscess left foot Referring Physician: [name] is an 61 y.o. male.  HPI: This is an 61 year old diabetic male whose had a history of ulceration on the left foot.  Previously has been seen by Dr. Excell Seltzer where there were plans for debridement of the second metatarsal in March but this was canceled.  Patient has not followed up with podiatry since that time and has been managed outpatient apparently by his primary care.  Recently referred to wound care who then referred the patient to the hospital.  States the wound had been doing well until about 20 days ago when he started noticed some drainage.  Recently had some increased swelling and redness with drainage.  Significantly worse in the last week and he presented to the emergency department for evaluation.  Past Medical History:  Diagnosis Date   Arthritis    hands   COVID-19 12/29/2019   Diabetes mellitus without complication (HCC)    type 2   GERD (gastroesophageal reflux disease)    Hypercholesterolemia    Osteomyelitis of left foot [facility])     Past Surgical History:  Procedure Laterality Date   ADENOIDECTOMY     AMPUTATION Right 10/07/2020   Procedure: AMPUTATION RAY-Right Fifth Ray;  Surgeon: Rosetta Posner, DPM;  Location: [facility];  Service: Podiatry;  Laterality: Right;   AMPUTATION Left 01/07/2021   Procedure: AMPUTATION RAY - Left 1st and 2nd Ray;  Surgeon: Rosetta Posner, DPM;  Location: [facility];  Service: Podiatry;  Laterality: Left;   AMPUTATION TOE     CATARACT EXTRACTION W/PHACO Right 03/03/2020   Procedure: CATARACT EXTRACTION PHACO AND INTRAOCULAR LENS PLACEMENT (IOC) RIGHT DIABETIC VISION BLUE;  Surgeon: Elliot Cousin, MD;  Location: [facility];  Service: Ophthalmology;  Laterality: Right;  COVID ( + ) 12-29-2019  30.97 02:27.7   CATARACT EXTRACTION W/PHACO Left 06/23/2020   Procedure: CATARACT EXTRACTION PHACO AND INTRAOCULAR LENS PLACEMENT (IOC) LEFT DIABETIC  VISION BLUE 5.14  00:34.7;  Surgeon: Elliot Cousin, MD;  Location: [facility];  Service: Ophthalmology;  Laterality: Left;  DIABETIC   ELBOW FRACTURE SURGERY Right    EXTERNAL EAR SURGERY     IRRIGATION AND DEBRIDEMENT FOOT Right 10/07/2020   Procedure: IRRIGATION AND DEBRIDEMENT FOOT;  Surgeon: Rosetta Posner, DPM;  Location: [facility];  Service: Podiatry;  Laterality: Right;    Family History  Problem Relation Age of Onset   Dementia Mother    Hypertension Mother    Cancer Brother        Cancer   Diabetes Maternal Grandmother    Hypertension Maternal Grandmother    COPD Neg Hx    Heart disease Neg Hx    Stroke Neg Hx     Social History:  reports that he has never smoked. He has never used smokeless tobacco. He reports that he does not currently use alcohol. He reports that he does not use drugs.  Allergies: No Known Allergies  Medications: Scheduled:  insulin aspart  0-6 Units Subcutaneous TID WC    Results for orders placed or performed during the hospital encounter of 06/20/22 (from the past 48 hour(s))  Comprehensive metabolic panel     Status: Abnormal   Collection Time: 06/20/22  2:26 PM  Result Value Ref Range   Sodium 137 135 - 145 mmol/L   Potassium 4.2 3.5 - 5.1 mmol/L   Chloride 101 98 - 111 mmol/L   CO2 25 22 - 32 mmol/L   Glucose, Bld 166 (  H) 70 - 99 mg/dL    Comment: Glucose reference range applies only to samples taken after fasting for at least 8 hours.   BUN 30 (H) 6 - 20 mg/dL   Creatinine, Ser 9.38 0.61 - 1.24 mg/dL   Calcium 8.7 (L) 8.9 - 10.3 mg/dL   Total Protein 8.1 6.5 - 8.1 g/dL   Albumin 3.0 (L) 3.5 - 5.0 g/dL   AST 101 (H) 15 - 41 U/L   ALT 187 (H) 0 - 44 U/L   Alkaline Phosphatase 85 38 - 126 U/L   Total Bilirubin 0.6 0.3 - 1.2 mg/dL   GFR, Estimated >75 >10 mL/min    Comment: (NOTE) Calculated using the CKD-EPI Creatinine Equation (2021)    Anion gap 11 5 - 15    Comment: Performed at [facility], [address] [place], [place] [address]  Lactic acid, plasma     Status: None   Collection Time: 06/20/22  2:26 PM  Result Value Ref Range   Lactic Acid, Venous 1.0 0.5 - 1.9 mmol/L    Comment: Performed at [facility], [address] [place], [place] [address]  CBC with Differential     Status: Abnormal   Collection Time: 06/20/22  2:26 PM  Result Value Ref Range   WBC 13.9 (H) 4.0 - 10.5 K/uL   RBC 3.63 (L) 4.22 - 5.81 MIL/uL   Hemoglobin 11.0 (L) 13.0 - 17.0 g/dL   HCT 23.5 (L) 36.1 - 44.3 %   MCV 93.1 80.0 - 100.0 fL   MCH 30.3 26.0 - 34.0 pg   MCHC 32.5 30.0 - 36.0 g/dL   RDW 15.4 00.8 - 67.6 %   Platelets 392 150 - 400 K/uL   nRBC 0.0 0.0 - 0.2 %   Neutrophils Relative % 85 %   Neutro Abs 11.7 (H) 1.7 - 7.7 K/uL   Lymphocytes Relative 9 %   Lymphs Abs 1.3 0.7 - 4.0 K/uL   Monocytes Relative 4 %   Monocytes Absolute 0.6 0.1 - 1.0 K/uL   Eosinophils Relative 1 %   Eosinophils Absolute 0.2 0.0 - 0.5 K/uL   Basophils Relative 0 %   Basophils Absolute 0.1 0.0 - 0.1 K/uL   Immature Granulocytes 1 %   Abs Immature Granulocytes 0.08 (H) 0.00 - 0.07 K/uL    Comment: Performed at [facility], [address] [place], [place] [address]  Culture, blood (Routine x 2)     Status: None (Preliminary result)   Collection Time: 06/20/22  2:26 PM   Specimen: BLOOD  Result Value Ref Range   Specimen Description BLOOD BLOOD LEFT FOREARM    Special Requests      BOTTLES DRAWN AEROBIC AND ANAEROBIC Blood Culture adequate volume   Culture      NO GROWTH < 24 HOURS Performed at [facility], [address] [place], [place] [address]    Report Status PENDING   Sedimentation rate     Status: Abnormal   Collection Time: 06/20/22  2:26 PM  Result Value Ref Range   Sed Rate 115 (H) 0 - 20 mm/hr    Comment: Performed at [facility], [address] [place], [place] [address]  Lactic acid, plasma     Status: None   Collection Time: 06/20/22  4:58 PM  Result  Value Ref Range   Lactic Acid, Venous 0.9 0.5 - 1.9 mmol/L    Comment: Performed at [facility], [address] [place], [place] [address]  Culture, blood (Routine x 2)     Status: None (Preliminary result)   Collection Time: 06/20/22  4:58 PM   Specimen: BLOOD  Result Value Ref Range   Specimen Description BLOOD BLOOD LEFT ARM    Special Requests      BOTTLES DRAWN AEROBIC AND ANAEROBIC Blood Culture adequate volume   Culture      NO GROWTH < 12 HOURS Performed at [facility], [address] [place], [place] [address]    Report Status PENDING   C-reactive protein     Status: Abnormal   Collection Time: 06/20/22  4:58 PM  Result Value Ref Range   CRP 24.8 (H) <1.0 mg/dL    Comment: Performed at [facility], [address] [place], [place] [address]  Hepatitis panel, acute     Status: None   Collection Time: 06/20/22  4:58 PM  Result Value Ref Range   Hepatitis B Surface Ag NON REACTIVE NON REACTIVE   HCV Ab NON REACTIVE NON REACTIVE    Comment: (NOTE) Nonreactive HCV antibody screen is consistent with no HCV infections,  unless recent infection is suspected or other evidence exists to indicate HCV infection.     Hep A IgM NON REACTIVE NON REACTIVE   Hep B C IgM NON REACTIVE NON REACTIVE    Comment: Performed at [facility], [address], [place], [place] [address]  Hemoglobin A1c     Status: Abnormal   Collection Time: 06/20/22  4:58 PM  Result Value Ref Range   Hgb A1c MFr Bld 6.2 (H) 4.8 - 5.6 %    Comment: (NOTE) Pre diabetes:          5.7%-6.4%  Diabetes:              >6.4%  Glycemic control for   <7.0% adults with diabetes    Mean Plasma Glucose 131.24 mg/dL    Comment: Performed at [facility], [address] [place], [place] [address]  HIV Antibody (routine testing w rflx)     Status: None   Collection Time: 06/20/22  4:58 PM  Result Value Ref Range   HIV Screen 4th Generation wRfx Non Reactive Non Reactive     Comment: Performed at [facility], [address], [place], [place] [address]  MRSA Next Gen by PCR, Nasal     Status: None   Collection Time: 06/20/22  4:58 PM   Specimen: Nasal Mucosa; Nasal Swab  Result Value Ref Range   MRSA by PCR Next Gen NOT DETECTED NOT DETECTED    Comment: (NOTE) The GeneXpert MRSA Assay (FDA approved for NASAL specimens only), is one component of a comprehensive MRSA colonization surveillance program. It is not intended to diagnose MRSA infection nor to guide or monitor treatment for MRSA infections. Test performance is not FDA approved in patients less than 67 years old. Performed at [facility], [address] [place], [place] [address]   Glucose, capillary     Status: Abnormal   Collection Time: 06/20/22 10:45 PM  Result Value Ref Range   Glucose-Capillary 103 (H) 70 - 99 mg/dL    Comment: Glucose reference range applies only to samples taken after fasting for at least 8 hours.  Comprehensive metabolic panel     Status: Abnormal   Collection Time: 06/21/22  5:26 AM  Result Value Ref Range   Sodium 136 135 - 145 mmol/L   Potassium  4.0 3.5 - 5.1 mmol/L   Chloride 104 98 - 111 mmol/L   CO2 25 22 - 32 mmol/L   Glucose, Bld 111 (H) 70 - 99 mg/dL    Comment: Glucose reference range applies only to samples taken after fasting for at least 8 hours.   BUN 21 (H) 6 - 20 mg/dL   Creatinine, Ser 1.610.86 0.61 - 1.24 mg/dL   Calcium 8.2 (L) 8.9 - 10.3 mg/dL   Total Protein 6.9 6.5 - 8.1 g/dL   Albumin 2.6 (L) 3.5 - 5.0 g/dL   AST 096151 (H) 15 - 41 U/L   ALT 189 (H) 0 - 44 U/L   Alkaline Phosphatase 72 38 - 126 U/L   Total Bilirubin 0.7 0.3 - 1.2 mg/dL   GFR, Estimated >04>60 >54>60 mL/min    Comment: (NOTE) Calculated using the CKD-EPI Creatinine Equation (2021)    Anion gap 7 5 - 15    Comment: Performed at [facility], [address] [place], [place] [address]  CBC     Status: Abnormal   Collection Time: 06/21/22  5:26 AM  Result  Value Ref Range   WBC 9.4 4.0 - 10.5 K/uL   RBC 3.38 (L) 4.22 - 5.81 MIL/uL   Hemoglobin 10.2 (L) 13.0 - 17.0 g/dL   HCT 91.430.7 (L) 78.239.0 - 95.652.0 %   MCV 90.8 80.0 - 100.0 fL   MCH 30.2 26.0 - 34.0 pg   MCHC 33.2 30.0 - 36.0 g/dL   RDW 21.312.0 08.611.5 - 57.815.5 %   Platelets 302 150 - 400 K/uL   nRBC 0.0 0.0 - 0.2 %    Comment: Performed at [facility], [address] [place], [place] [address]  Glucose, capillary     Status: Abnormal   Collection Time: 06/21/22  7:39 AM  Result Value Ref Range   Glucose-Capillary 120 (H) 70 - 99 mg/dL    Comment: Glucose reference range applies only to samples taken after fasting for at least 8 hours.   Comment 1 Notify RN     MR FOOT LEFT W WO CONTRAST  Result Date: 06/21/2022 CLINICAL DATA:  Foot pain and swelling. History of prior amputations. EXAM: MRI OF THE LEFT FOREFOOT WITHOUT AND WITH CONTRAST TECHNIQUE: Multiplanar, multisequence MR imaging of the left foot was performed both before and after administration of intravenous contrast. CONTRAST:  10mL GADAVIST GADOBUTROL 1 MMOL/ML IV SOLN COMPARISON:  Radiographs 06/20/2022 FINDINGS: Severe/diffuse soft tissue swelling/edema consistent with severe cellulitis most notably over the medial aspect of the foot. There are multiple abscesses. The largest is a curvilinear abscess wrapping around the partially amputated first metatarsal this measures a maximum of 4 cm. Second largest abscess is in the dorsal aspect of the midfoot and this measures 4.0 x 3.1 x 1.5 cm. Several other smaller abscesses noted. Diffuse osteomyelitis involving the first and second metatarsals. Chronic appearing deformity of the third MTP joint but I do not see any definite findings to suggest septic arthritis or osteomyelitis. 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 IMPRESSION: 1. Severe/diffuse cellulitis most notably over the medial aspect of the foot. 2. Multiple subcutaneous abscesses. 3. Diffuse osteomyelitis  involving the first and second metatarsals. 4. 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 Electronically Signed   By: Rudie MeyerP.  Gallerani M.D.   On: 06/21/2022 07:31   US ABDOMEN LIMITED RUQ (LIVER/GB)  Result Date: 06/20/2022 CLINICAL DATA:  Transaminitis. EXAM: ULTRASOUND ABDOMEN LIMITED RIGHT UPPER QUADRANT COMPARISON:  None Available. FINDINGS: Gallbladder: Numerous small  less than 1 cm gallstones are seen. No evidence of gallbladder dilatation or wall thickening. No sonographic Murphy sign noted by sonographer. Common bile duct: Diameter: 5 mm, within normal limits. Liver: No focal lesion identified. Within normal limits in parenchymal echogenicity. Portal vein is patent on color Doppler imaging with normal direction of blood flow towards the liver. Other: None. IMPRESSION: Cholelithiasis. No sonographic signs of cholecystitis or biliary ductal dilatation. Unremarkable sonographic appearance of liver. Electronically Signed   By: Danae Orleans M.D.   On: 06/20/2022 18:12   DG Foot Complete Left  Result Date: 06/20/2022 CLINICAL DATA:  Infection and concern for osteomyelitis.  Diabetes. EXAM: LEFT FOOT - COMPLETE 3+ VIEW COMPARISON:  Left foot radiograph dated 01/07/2021. FINDINGS: Status post prior amputation of the midportion of the first metatarsal and second phalanges. Several small bone densities adjacent to the first metatarsal amputation may be chronic. Osteomyelitis is not excluded. There is irregularity of the second metatarsal likely related to chronic infection or less likely old healed fracture. There is sclerotic changes and irregularity of the third metatarsophalangeal joint consistent with chronic osteomyelitis. There is no acute fracture or dislocation. The bones are osteopenic. There is soft tissue thickening of the forefoot primarily over the first and second ray. No soft tissue gas. IMPRESSION: 1. No acute fracture or dislocation. 2. Status post prior amputation of the midportion  of the first metatarsal and second phalanges. 3. Soft tissue thickening of the forefoot and chronic changes of the second metatarsal and third MTP joint. MRI or a white blood cell nuclear scan may provide better evaluation if there is high clinical concern for acute osteomyelitis. Electronically Signed   By: Elgie Collard M.D.   On: 06/20/2022 14:45    Review of Systems  Constitutional:  Negative for chills and fever.  HENT:  Negative for sinus pain and sore throat.   Respiratory:  Negative for cough and shortness of breath.   Cardiovascular:  Negative for chest pain and palpitations.  Gastrointestinal:  Negative for nausea and vomiting.  Genitourinary:  Negative for frequency and urgency.  Musculoskeletal:        Recent increase with pain in the left foot.  Previous amputations on the left first and second toes.  Skin:        Recent increased redness and swelling with some drainage from ulcerative areas on the left foot.  Has had some chronic problems with ulcerations  Neurological:        Significant neuropathy associated with his diabetes.  Psychiatric/Behavioral:  Negative for confusion. The patient is not nervous/anxious.    Blood pressure 130/71, pulse 73, temperature 98.9 F (37.2 C), temperature source Oral, resp. rate 20, height 6' (1.829 m), weight 105.2 kg, SpO2 96 %. Physical Exam Cardiovascular:     Comments: DP and PT pulses fully palpable on the right.  PT pulse 2/4 on the left with DP pulse 1/4 palpable. Musculoskeletal:     Comments: Previous partial ray resection first metatarsal with amputation of the first and second toes.  Skin:    Comments: Significant edema with some erythema in the left forefoot with some dry crusted drainage from the dorsal aspect.  Skin is mildly atrophic.  Neurological:     Comments: Loss of protective threshold with monofilament wire distally in the forefoot and toes.     Assessment/Plan: Assessment: 1.  Extensive osteomyelitis left  first and second metatarsals. 2.  Multiple abscesses left foot and ankle. 3.  Diabetes with associated neuropathy.  Plan: Discussed with the patient the extensive nature of the infection in his left foot including the bone and top of the foot and ankle.  Discussed the need for debridement of the infected bone as well as all of the abscessed areas.  Also discussed that we may need to go ahead and consider transmetatarsal amputation.  Discussed possible risk and complications of the procedure including but not limited to mostly inability of the wound to heal due to the extent of infection or his diabetes.  Patient is at significant risk for the need for higher amputation.  Discussed this in detail with the patient.  Questions invited and answered.  At this point we will plan for surgery this evening after work.  Consent form for I&D multiple sites including bone left foot with possible transmetatarsal amputation.  N.p.o. after breakfast, 9 AM, and plan for surgery this evening.  Ricci Barker 06/21/2022, 8:54 AM

## 2022-06-21 NOTE — Assessment & Plan Note (Addendum)
Presented with AST 193, ALT 187 with otherwise normal hepatic function other than albumin 3.0.  Acute hepatitis panel negative.  Patient denies alcohol use.  Right upper quadrant ultrasound showed cholelithiasis without signs of acute cholecystitis or biliary ductal dilation.  Liver appeared normal on ultrasound.  Patient has no abdominal pain nausea or vomiting.  AST normalized ALT improved, 94 -- Monitor LFTs

## 2022-06-21 NOTE — Assessment & Plan Note (Addendum)
Due to nonhealing diabetic foot ulcer.  Presented with worsening swelling and drainage despite 14-day course of Keflex.  MRI of the left foot shows severe diffuse cellulitis mostly over the medial aspect of the foot, multiple subcutaneous abscesses, diffuse osteomyelitis involving the first and second metatarsals, as well as severe midfoot degenerative changes felt to be neuropathic. Initially treated with empiric IV vancomycin and cefepime. 7/13: Underwent transmetatarsal amputation of left foot I&D of abscesses, culture sent 7/16: Operative culture growing MSSA 7/17: final pathology results still pending from bone tissue -- Podiatry following -- Transition antibiotic to IV cefazolin -- ID following, appreciate recommendations -- Monitor fever curve, CBC -- Follow-up podiatry's recommendations

## 2022-06-21 NOTE — Assessment & Plan Note (Signed)
See acute osteomyelitis

## 2022-06-21 NOTE — Progress Notes (Addendum)
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  He had recently been treated with 14-day course of Keflex as an outpatient.  Presented to the ED on 06/20/2021 after being seen in clinic where the wound was noted to have become more red swollen and with increased drainage.    Patient mated to the hospital for IV antibiotics.  Podiatry consulted with plans to take patient to the OR for debridement.  Podiatry feels patient may ultimately require transmetatarsal amputation.  Assessment and Plan: * Diabetic foot infection (HCC) See acute osteomyelitis  Transaminitis Presented with AST 193, ALT 187 with otherwise normal hepatic function other than albumin 3.0.  Acute hepatitis panel negative.  Patient denies alcohol use.  Right upper quadrant ultrasound showed cholelithiasis without signs of acute cholecystitis or biliary ductal dilation.  Liver appeared normal on ultrasound.  Patient has no abdominal pain nausea or vomiting.  AST improved to 151 ALT 189 from 187 -- Monitor LFTs  GERD (gastroesophageal reflux disease) Continue PPI  Osteomyelitis of foot, left, acute (HCC) Due to nonhealing diabetic foot ulcer.  Presented with worsening swelling and drainage despite 14-day course of Keflex.  MRI of the left foot shows severe diffuse cellulitis mostly over the medial aspect of the foot, multiple subcutaneous abscesses, diffuse osteomyelitis involving the first and second metatarsals, as well as severe midfoot degenerative changes felt to be neuropathic. -- Podiatry following -- Going to the OR for  extensive debridement this evening -- Hold heparin VTE ppx, place SCD's -- Continue IV vancomycin and cefepime -- ID consult -- Monitor fever curve, CBC -- Follow-up podiatry as recommendations postop -- N.p.o., resume diet after surgery this evening  Type 2 diabetes mellitus with diabetic neuropathy, with long-term current use of insulin (HCC) Well-controlled with last A1c 6.2%.  Hold oral medications.  Cover with sliding scale NovoLog for now  Hyperlipidemia associated with type 2 diabetes mellitus (HCC) Lipitor on hold due to transaminitis        Subjective: Patient awake sitting up in bed visiting with brother at bedside when seen on rounds today.  He reports his foot hurts at times but otherwise feeling okay.  Denies fevers or chills.  Denies having any abdominal pain nausea or vomiting.  He asked about his liver tests and ultrasound results which we discussed.  He denies any alcohol use.  Physical Exam: Vitals:   06/21/22 0153 06/21/22 0500 06/21/22 0517 06/21/22 1611  BP: (!) 148/76  130/71 (!) 146/80  Pulse: 79  73 78  Resp: 18  20 16   Temp:   98.9 F (37.2 C) 98.7 F (37.1 C)  TempSrc:   Oral Temporal  SpO2: 95%  96% 96%  Weight:  105.2 kg  105.2 kg  Height:    6' (1.829 m)   General exam: awake, alert, no acute distress HEENT: atraumatic, clear conjunctiva, anicteric sclera, moist mucus membranes, hearing grossly normal  Respiratory system: CTAB, no wheezes, rales or rhonchi, normal respiratory effort. Cardiovascular system: normal S1/S2, RRR, no pedal edema.   Gastrointestinal system: soft, NT, ND, no HSM felt, +bowel sounds. Central nervous system: A&O x3.  no gross focal neurologic deficits, normal speech Extremities: Left foot is status post amputation of the first 2 toes with a plantar aspect ulcer with no visible drainage but some surrounding edema in the left foot,  Skin: dry, intact, normal temperature, no rashes seen on visualized skin Psychiatry: normal  mood, congruent affect, judgement and insight appear normal   Data Reviewed:  Labs notable for glucose 111, BUN 21, calcium 8.2 with albumin 2.6, AST improved 151, ALT 189 from 187, hemoglobin 10.2, hemoglobin A1c 6.2% Acute hepatitis panel negative, HIV screen negative.  Right upper quadrant ultrasound with cholelithiasis no signs of cholecystitis or biliary ductal dilation, unremarkable appearance of the liver.  Family Communication: Brother was at bedside on rounds  Disposition: Status is: Inpatient Remains inpatient appropriate because: On empiric IV antibiotics pending cultures, podiatry planning for surgical debridement later today   Planned Discharge Destination: Home    Time spent: 40 minutes  Author: Pennie Banter, DO 06/21/2022 5:55 PM  For on call review [email or web address].

## 2022-06-21 NOTE — Hospital Course (Signed)
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  He had recently been treated with 14-day course of Keflex as an outpatient.  Presented to the ED on 06/20/2021 after being seen in clinic where the wound was noted to have become more red swollen and with increased drainage.    Patient mated to the hospital for IV antibiotics.  Podiatry consulted with plans to take patient to the OR for debridement.  Podiatry feels patient may ultimately require transmetatarsal amputation.

## 2022-06-22 ENCOUNTER — Encounter: Payer: Self-pay | Admitting: Podiatry

## 2022-06-22 ENCOUNTER — Other Ambulatory Visit: Payer: Self-pay

## 2022-06-22 DIAGNOSIS — Z794 Long term (current) use of insulin: Secondary | ICD-10-CM

## 2022-06-22 DIAGNOSIS — E1169 Type 2 diabetes mellitus with other specified complication: Secondary | ICD-10-CM

## 2022-06-22 DIAGNOSIS — E11628 Type 2 diabetes mellitus with other skin complications: Secondary | ICD-10-CM | POA: Diagnosis not present

## 2022-06-22 DIAGNOSIS — E1140 Type 2 diabetes mellitus with diabetic neuropathy, unspecified: Secondary | ICD-10-CM

## 2022-06-22 DIAGNOSIS — L089 Local infection of the skin and subcutaneous tissue, unspecified: Secondary | ICD-10-CM | POA: Diagnosis not present

## 2022-06-22 LAB — CBC
HCT: 31.2 % — ABNORMAL LOW (ref 39.0–52.0)
Hemoglobin: 10.1 g/dL — ABNORMAL LOW (ref 13.0–17.0)
MCH: 29.8 pg (ref 26.0–34.0)
MCHC: 32.4 g/dL (ref 30.0–36.0)
MCV: 92 fL (ref 80.0–100.0)
Platelets: 279 10*3/uL (ref 150–400)
RBC: 3.39 MIL/uL — ABNORMAL LOW (ref 4.22–5.81)
RDW: 12.1 % (ref 11.5–15.5)
WBC: 11.7 10*3/uL — ABNORMAL HIGH (ref 4.0–10.5)
nRBC: 0 % (ref 0.0–0.2)

## 2022-06-22 LAB — COMPREHENSIVE METABOLIC PANEL
ALT: 142 U/L — ABNORMAL HIGH (ref 0–44)
AST: 72 U/L — ABNORMAL HIGH (ref 15–41)
Albumin: 2.5 g/dL — ABNORMAL LOW (ref 3.5–5.0)
Alkaline Phosphatase: 66 U/L (ref 38–126)
Anion gap: 3 — ABNORMAL LOW (ref 5–15)
BUN: 13 mg/dL (ref 6–20)
CO2: 25 mmol/L (ref 22–32)
Calcium: 8 mg/dL — ABNORMAL LOW (ref 8.9–10.3)
Chloride: 109 mmol/L (ref 98–111)
Creatinine, Ser: 0.78 mg/dL (ref 0.61–1.24)
GFR, Estimated: >60 mL/min (ref >60)
Glucose, Bld: 187 mg/dL — ABNORMAL HIGH (ref 70–99)
Potassium: 4.4 mmol/L (ref 3.5–5.1)
Sodium: 137 mmol/L (ref 135–145)
Total Bilirubin: 0.5 mg/dL (ref 0.3–1.2)
Total Protein: 6.9 g/dL (ref 6.5–8.1)

## 2022-06-22 LAB — GLUCOSE, CAPILLARY
Glucose-Capillary: 229 mg/dL — ABNORMAL HIGH (ref 70–99)
Glucose-Capillary: 176 mg/dL — ABNORMAL HIGH (ref 70–99)
Glucose-Capillary: 231 mg/dL — ABNORMAL HIGH (ref 70–99)
Glucose-Capillary: 260 mg/dL — ABNORMAL HIGH (ref 70–99)
Glucose-Capillary: 223 mg/dL — ABNORMAL HIGH (ref 70–99)

## 2022-06-22 LAB — AEROBIC CULTURE W GRAM STAIN (SUPERFICIAL SPECIMEN): Gram Stain: NONE SEEN

## 2022-06-22 LAB — MAGNESIUM: Magnesium: 1.7 mg/dL (ref 1.7–2.4)

## 2022-06-22 MED ORDER — PHENOL 1.4 % MT LIQD
1.0000 | OROMUCOSAL | Status: DC | PRN
  Administered 2022-06-22: 1 via OROMUCOSAL
  Filled 2022-06-22: qty 177

## 2022-06-22 MED ORDER — VANCOMYCIN HCL 1500 MG/300ML IV SOLN
1500.0000 mg | Freq: Two times a day (BID) | INTRAVENOUS | Status: DC
  Administered 2022-06-22 – 2022-06-24 (×4): 1500 mg via INTRAVENOUS
  Filled 2022-06-22 (×4): qty 300

## 2022-06-22 NOTE — Progress Notes (Signed)
1 Day Post-Op   Subjective/Chief Complaint: Patient seen.  Some pain which does seem to be controlled with medication.  Otherwise no complaints.   Objective: Vital signs in last 24 hours: Temp:  [97.5 F (36.4 C)-98.7 F (37.1 C)] 98.4 F (36.9 C) (07/14 0900) Pulse Rate:  [70-80] 72 (07/14 0900) Resp:  [14-19] 18 (07/14 0900) BP: (105-146)/(51-80) 135/74 (07/14 0900) SpO2:  [94 %-97 %] 96 % (07/14 0900) Weight:  [105.2 kg] 105.2 kg (07/13 1611) Last BM Date : 06/20/22  Intake/Output from previous day: 07/13 0701 - 07/14 0700 In: 940 [P.O.:240; I.V.:600; IV Piggyback:100] Out: 950 [Urine:950] Intake/Output this shift: Total I/O In: 552.2 [IV Piggyback:552.2] Out: 450 [Urine:450]  Bandage on the left foot is dry and intact.  Upon removal there is only mild bleeding from the transmetatarsal amputation site.  Some moderate drainage noted from the dorsal I&D site.  Some erythema and early appearing cyanotic appearance to the dorsal flap.  Otherwise incision is well coapted.       Lab Results:  Recent Labs    06/21/22 0526 06/22/22 0602  WBC 9.4 11.7*  HGB 10.2* 10.1*  HCT 30.7* 31.2*  PLT 302 279   BMET Recent Labs    06/21/22 0526 06/22/22 0602  NA 136 137  K 4.0 4.4  CL 104 109  CO2 25 25  GLUCOSE 111* 187*  BUN 21* 13  CREATININE 0.86 0.78  CALCIUM 8.2* 8.0*   PT/INR No results for input(s): "LABPROT", "INR" in the last 72 hours. ABG No results for input(s): "PHART", "HCO3" in the last 72 hours.  Invalid input(s): "PCO2", "PO2"  Studies/Results: DG MINI C-ARM IMAGE ONLY  Result Date: 06/21/2022 There is no interpretation for this exam.  This order is for images obtained during a surgical procedure.  Please See "Surgeries" Tab for more information regarding the procedure.   MR FOOT LEFT W WO CONTRAST  Result Date: 06/21/2022 CLINICAL DATA:  Foot pain and swelling. History of prior amputations. EXAM: MRI OF THE LEFT FOREFOOT WITHOUT AND WITH  CONTRAST TECHNIQUE: Multiplanar, multisequence MR imaging of the left foot was performed both before and after administration of intravenous contrast. CONTRAST:  47mL GADAVIST GADOBUTROL 1 MMOL/ML IV SOLN COMPARISON:  Radiographs 06/20/2022 FINDINGS: Severe/diffuse soft tissue swelling/edema consistent with severe cellulitis most notably over the medial aspect of the foot. There are multiple abscesses. The largest is a curvilinear abscess wrapping around the partially amputated first metatarsal this measures a maximum of 4 cm. Second largest abscess is in the dorsal aspect of the midfoot and this measures 4.0 x 3.1 x 1.5 cm. Several other smaller abscesses noted. Diffuse osteomyelitis involving the first and second metatarsals. Chronic appearing deformity of the third MTP joint but I do not see any definite findings to suggest septic arthritis or osteomyelitis. 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 IMPRESSION: 1. Severe/diffuse cellulitis most notably over the medial aspect of the foot. 2. Multiple subcutaneous abscesses. 3. Diffuse osteomyelitis involving the first and second metatarsals. 4. 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 Electronically Signed   By: Rudie Meyer M.D.   On: 06/21/2022 07:31   US ABDOMEN LIMITED RUQ (LIVER/GB)  Result Date: 06/20/2022 CLINICAL DATA:  Transaminitis. EXAM: ULTRASOUND ABDOMEN LIMITED RIGHT UPPER QUADRANT COMPARISON:  None Available. FINDINGS: Gallbladder: Numerous small less than 1 cm gallstones are seen. No evidence of gallbladder dilatation or wall thickening. No sonographic Murphy sign noted by sonographer. Common bile duct: Diameter: 5 mm, within normal limits. Liver: No focal lesion  identified. Within normal limits in parenchymal echogenicity. Portal vein is patent on color Doppler imaging with normal direction of blood flow towards the liver. Other: None. IMPRESSION: Cholelithiasis. No sonographic signs of cholecystitis or biliary  ductal dilatation. Unremarkable sonographic appearance of liver. Electronically Signed   By: Danae Orleans M.D.   On: 06/20/2022 18:12   DG Foot Complete Left  Result Date: 06/20/2022 CLINICAL DATA:  Infection and concern for osteomyelitis.  Diabetes. EXAM: LEFT FOOT - COMPLETE 3+ VIEW COMPARISON:  Left foot radiograph dated 01/07/2021. FINDINGS: Status post prior amputation of the midportion of the first metatarsal and second phalanges. Several small bone densities adjacent to the first metatarsal amputation may be chronic. Osteomyelitis is not excluded. There is irregularity of the second metatarsal likely related to chronic infection or less likely old healed fracture. There is sclerotic changes and irregularity of the third metatarsophalangeal joint consistent with chronic osteomyelitis. There is no acute fracture or dislocation. The bones are osteopenic. There is soft tissue thickening of the forefoot primarily over the first and second ray. No soft tissue gas. IMPRESSION: 1. No acute fracture or dislocation. 2. 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 3. Soft tissue thickening of the forefoot and chronic changes of the second metatarsal and third MTP joint. MRI or a white blood cell nuclear scan may provide better evaluation if there is high clinical concern for acute osteomyelitis. Electronically Signed   By: Elgie Collard M.D.   On: 06/20/2022 14:45    Anti-infectives: Anti-infectives (From admission, onward)    Start     Dose/Rate Route Frequency Ordered Stop   06/21/22 2000  vancomycin (VANCOREADY) IVPB 1750 mg/350 mL        1,750 mg 175 mL/hr over 120 Minutes Intravenous Every 12 hours 06/21/22 1321     06/21/22 0800  vancomycin (VANCOREADY) IVPB 1250 mg/250 mL  Status:  Discontinued        1,250 mg 166.7 mL/hr over 90 Minutes Intravenous Every 12 hours 06/20/22 2000 06/21/22 1321   06/20/22 2200  ceFEPIme (MAXIPIME) 2 g in sodium  chloride 0.9 % 100 mL IVPB        2 g 200 mL/hr over 30 Minutes Intravenous Every 8 hours 06/20/22 1927     06/20/22 1700  vancomycin (VANCOREADY) IVPB 1500 mg/300 mL       See Hyperspace for full Linked Orders Report.   1,500 mg 150 mL/hr over 120 Minutes Intravenous  Once 06/20/22 1656 06/20/22 2003   06/20/22 1700  vancomycin (VANCOCIN) IVPB 1000 mg/200 mL premix       See Hyperspace for full Linked Orders Report.   1,000 mg 200 mL/hr over 60 Minutes Intravenous  Once 06/20/22 1656 06/20/22 2109   06/20/22 1700  ceFEPIme (MAXIPIME) 2 g in sodium chloride 0.9 % 100 mL IVPB        2 g 200 mL/hr over 30 Minutes Intravenous  Once 06/20/22 1657 06/20/22 1755       Assessment/Plan: s/p Procedure(s): IRRIGATION AND DEBRIDEMENT ABSCESS (Left) TRANSMETATARSAL AMPUTATION-1st and 2nd (Left) Assessment: Status post I&D and transmetatarsal amputation left foot, condition guarded.  Plan: Betadine gauze applied to the incision site.  Sterile saline wet-to-dry gauze packed within the dorsal I&D wound.  Bulky sterile bandage applied.  Continue on antibiotics with close observation.  Patient still at risk for need for repeat debridement and/or amputation.  LOS: 2 days    Ricci Barker 06/22/2022

## 2022-06-22 NOTE — Consult Note (Signed)
NAME: [name]  DOB: Oct 24, 1961  MRN: [id number]  Date/Time: 06/22/2022 11:32 AM  REQUESTING PROVIDER: Genella Mech Subjective:  REASON FOR CONSULT: left foot infection ? [name] is a 61 y.o. with a history of DM, left 1st and 2nd toe amputaiton , chronic wound left foot presented with worsening wound on the left foot- He was sent form wound clinic on  In July Aug 2021 he was treated with 6 weeks of PO levaquin and flagyl for osteo of the toes- Culture had only rare skin bacteria He was followed by [facility] ID then On 10/07/20 he had amputation of the rt foot 5th ray On 01/07/21 he underwent left partial first ray amputation and 2nd toe amputation. HE says following that the wound had healed but wound open up some time- there was plan to do further surgery in march this year but it was canceled as patient felt the wound had healed On 6/28/ patient went to PCP for wound on the left foot and was prescribed keflex and referred to wound clinic- He went to the wound clinic on 06/20/22 and was referred to the ED Vitals in the ED 148/89, Temp 98, Pulse 83. RR 16, sats 96% Labs  WBC 13.9, HB 11, PLT 392 and cr 1.18. He had MRI of the foot and that revealed diffuse osteomyelitis involving the first and second metatarsal with severe midfoot degenerative changes. He was started on vanco and cefepime-  He underwent TMA on 06/21/22 preliminary reading was staph aureus from the wound- surgical cultures pending  Pt has no fever or chills Lives on his own   Past Medical History:  Diagnosis Date   Arthritis    hands   COVID-19 12/29/2019   Diabetes mellitus without complication (HCC)    type 2   GERD (gastroesophageal reflux disease)    Hypercholesterolemia    Osteomyelitis of left foot [facility])     Past Surgical History:  Procedure Laterality Date   ADENOIDECTOMY     AMPUTATION Right 10/07/2020   Procedure: AMPUTATION RAY-Right Fifth Ray;  Surgeon: [place] More, DPM;  Location: [facility] OR[facility];  Service:  Podiatry;  Laterality: Right;   AMPUTATION Left 01/07/2021   Procedure: AMPUTATION RAY - Left 1st and 2nd Ray;  Surgeon: [place] More, DPM;  Location: [facility];  Service: Podiatry;  Laterality: Left;   AMPUTATION TOE     CATARACT EXTRACTION W/PHACO Right 03/03/2020   Procedure: CATARACT EXTRACTION PHACO AND INTRAOCULAR LENS PLACEMENT ([place]) RIGHT DIABETIC VISION BLUE;  Surgeon: Marchia Meiers, MD;  Location: [place];  Service: Ophthalmology;  Laterality: Right;  COVID ( + ) 12-29-2019  30.97 02:27.7   CATARACT EXTRACTION W/PHACO Left 06/23/2020   Procedure: CATARACT EXTRACTION PHACO AND INTRAOCULAR LENS PLACEMENT ([place]) LEFT DIABETIC VISION BLUE 5.14  00:34.7;  Surgeon: Marchia Meiers, MD;  Location: [place];  Service: Ophthalmology;  Laterality: Left;  DIABETIC   ELBOW FRACTURE SURGERY Right    EXTERNAL EAR SURGERY     IRRIGATION AND DEBRIDEMENT FOOT Right 10/07/2020   Procedure: IRRIGATION AND DEBRIDEMENT FOOT;  Surgeon: [place] More, DPM;  Location: [facility];  Service: Podiatry;  Laterality: Right;    Social History   Socioeconomic History   Marital status: Single    Spouse name: Not on file   Number of children: Not on file   Years of education: Not on file   Highest education level: Not on file  Occupational History   Not on file  Tobacco Use   Smoking status: Never  Smokeless tobacco: Never  Vaping Use   Vaping Use: Never used  Substance and Sexual Activity   Alcohol use: Not Currently    Comment: occasionally   Drug use: No   Sexual activity: Not Currently  Other Topics Concern   Not on file  Social History Narrative   Not on file   Social Determinants of Health   Financial Resource Strain: Not on file  Food Insecurity: Not on file  Transportation Needs: Not on file  Physical Activity: Not on file  Stress: Not on file  Social Connections: Not on file  Intimate Partner Violence: Not on file    Family History  Problem Relation Age of Onset    Dementia Mother    Hypertension Mother    Cancer Brother        Cancer   Diabetes Maternal Grandmother    Hypertension Maternal Grandmother    COPD Neg Hx    Heart disease Neg Hx    Stroke Neg Hx    No Known Allergies I? Current Facility-Administered Medications  Medication Dose Route Frequency Provider Last Rate Last Admin   albuterol (PROVENTIL) (2.5 MG/3ML) 0.083% nebulizer solution 2.5 mg  2.5 mg Nebulization Q2H PRN Sharlotte Alamo, DPM       ceFEPIme (MAXIPIME) 2 g in sodium chloride 0.9 % 100 mL IVPB  2 g Intravenous Q8H Sharlotte Alamo, DPM   Stopped at 06/22/22 3016   insulin aspart (novoLOG) injection 0-6 Units  0-6 Units Subcutaneous TID WC Sharlotte Alamo, DPM   1 Units at 06/22/22 0853   morphine (PF) 2 MG/ML injection 2 mg  2 mg Intravenous Q2H PRN Sharlotte Alamo, DPM       ondansetron [facility]) tablet 4 mg  4 mg Oral Q6H PRN Sharlotte Alamo, DPM       Or   ondansetron [facility]) injection 4 mg  4 mg Intravenous Q6H PRN Sharlotte Alamo, DPM       oxyCODONE-acetaminophen (PERCOCET/ROXICET) 5-325 MG per tablet 1-2 tablet  1-2 tablet Oral Q4H PRN Sharlotte Alamo, DPM   2 tablet at 06/22/22 0726   phenol (CHLORASEPTIC) mouth spray 1 spray  1 spray Mouth/Throat PRN Nicole Kindred A, DO   1 spray at 06/22/22 0727   traMADol (ULTRAM) tablet 50 mg  50 mg Oral Q8H PRN Sharlotte Alamo, DPM       vancomycin (VANCOREADY) IVPB 1750 mg/350 mL  1,750 mg Intravenous Q12H Sharlotte Alamo, DPM 175 mL/hr at 06/22/22 0854 Infusion Verify at 06/22/22 0854     Abtx:  Anti-infectives (From admission, onward)    Start     Dose/Rate Route Frequency Ordered Stop   06/21/22 2000  vancomycin (VANCOREADY) IVPB 1750 mg/350 mL        1,750 mg 175 mL/hr over 120 Minutes Intravenous Every 12 hours 06/21/22 1321     06/21/22 0800  vancomycin (VANCOREADY) IVPB 1250 mg/250 mL  Status:  Discontinued        1,250 mg 166.7 mL/hr over 90 Minutes Intravenous Every 12 hours 06/20/22 2000 06/21/22 1321   06/20/22 2200  ceFEPIme (MAXIPIME) 2  g in sodium chloride 0.9 % 100 mL IVPB        2 g 200 mL/hr over 30 Minutes Intravenous Every 8 hours 06/20/22 1927     06/20/22 1700  vancomycin (VANCOREADY) IVPB 1500 mg/300 mL       See Hyperspace for full Linked Orders Report.   1,500 mg 150 mL/hr over 120 Minutes Intravenous  Once 06/20/22 1656 06/20/22  2003   06/20/22 1700  vancomycin (VANCOCIN) IVPB 1000 mg/200 mL premix       See Hyperspace for full Linked Orders Report.   1,000 mg 200 mL/hr over 60 Minutes Intravenous  Once 06/20/22 1656 06/20/22 2109   06/20/22 1700  ceFEPIme (MAXIPIME) 2 g in sodium chloride 0.9 % 100 mL IVPB        2 g 200 mL/hr over 30 Minutes Intravenous  Once 06/20/22 1657 06/20/22 1755       REVIEW OF SYSTEMS:  Const: negative fever, negative chills, negative weight loss Eyes: negative diplopia or visual changes, negative eye pain ENT: negative coryza, negative sore throat Resp: negative cough, hemoptysis, dyspnea Cards: negative for chest pain, palpitations, lower extremity edema GU: negative for frequency, dysuria and hematuria GI: Negative for abdominal pain, diarrhea, bleeding, constipation Skin: negative for rash and pruritus Heme: negative for easy bruising and gum/nose bleeding MS: left foot pain Neurolo:negative for headaches, dizziness, vertigo, memory problems  Psych: negative for feelings of anxiety, depression  Endocrine: , diabetes Allergy/Immunology- negative for any medication or food allergies  Objective:  VITALS:  BP 135/74 (BP Location: Right Arm)   Pulse 72   Temp 98.4 F (36.9 C)   Resp 18   Ht 6' (1.829 m)   Wt 105.2 kg   SpO2 96%   BMI 31.45 kg/m  LDA Foley Central line Other drainage tubes PHYSICAL EXAM:  General: Alert, cooperative, no distress, appears stated age.  Head: Normocephalic, without obvious abnormality, atraumatic. Eyes: Conjunctivae clear, anicteric sclerae. Pupils are equal ENT Nares normal. No drainage or sinus tenderness. Lips, mucosa, and  tongue normal. No Thrush Neck: Supple, symmetrical, no adenopathy, thyroid: non tender no carotid bruit and no JVD. Back: No CVA tenderness. Lungs: Clear to auscultation bilaterally. No Wheezing or Rhonchi. No rales. Heart: Regular rate and rhythm, no murmur, rub or gallop. Abdomen: Soft, non-tender,not distended. Bowel sounds normal. No masses Extremities: rt foot- 5th toe amputated- healed very well Left foot- TMA- sutures approximated- an area of maceration       Skin: No rashes or lesions. Or bruising Lymph: Cervical, supraclavicular normal. Neurologic: Grossly non-focal Pertinent Labs Lab Results CBC    Component Value Date/Time   WBC 11.7 (H) 06/22/2022 0602   RBC 3.39 (L) 06/22/2022 0602   HGB 10.1 (L) 06/22/2022 0602   HGB 12.0 (L) 04/20/2022 1517   HCT 31.2 (L) 06/22/2022 0602   HCT 35.2 (L) 04/20/2022 1517   PLT 279 06/22/2022 0602   PLT 226 04/20/2022 1517   MCV 92.0 06/22/2022 0602   MCV 92 04/20/2022 1517   MCH 29.8 06/22/2022 0602   MCHC 32.4 06/22/2022 0602   RDW 12.1 06/22/2022 0602   RDW 12.7 04/20/2022 1517   LYMPHSABS 1.3 06/20/2022 1426   LYMPHSABS 2.1 04/20/2022 1517   MONOABS 0.6 06/20/2022 1426   EOSABS 0.2 06/20/2022 1426   EOSABS 0.3 04/20/2022 1517   BASOSABS 0.1 06/20/2022 1426   BASOSABS 0.0 04/20/2022 1517       Latest Ref Rng & Units 06/22/2022    6:02 AM 06/21/2022    5:26 AM 06/20/2022    2:26 PM  CMP  Glucose 70 - 99 mg/dL 187  111  166   BUN 6 - 20 mg/dL _0 Creatinine 0.61 - 1.24 mg/dL 0.78  0.86  1.18   Sodium 135 - 145 mmol/L 137  136  137   Potassium 3.5 - 5.1 mmol/L 4.4  4.0  4.2   Chloride 98 - 111 mmol/L 109  104  101   CO2 22 - 32 mmol/L _0 Calcium 8.9 - 10.3 mg/dL 8.0  8.2  8.7   Total Protein 6.5 - 8.1 g/dL 6.9  6.9  8.1   Total Bilirubin 0.3 - 1.2 mg/dL 0.5  0.7  0.6   Alkaline Phos 38 - 126 U/L 66  72  85   AST 15 - 41 U/L 72  151  193   ALT 0 - 44 U/L 142  189  187        Microbiology: Recent Results (from the past 240 hour(s))  Aerobic Culture w Gram Stain (superficial specimen)     Status: None (Preliminary result)   Collection Time: 06/20/22  1:33 PM   Specimen: Wound  Result Value Ref Range Status   Specimen Description   Final    WOUND Performed at [facility], [address] [place], [place] [address]    Special Requests   Final    LEFT FOOT Performed at [facility], [address] [place], [place] [address]    Gram Stain   Final    NO WBC SEEN RARE GRAM POSITIVE COCCI Performed at [facility], [place] [address], [place], [place] [address]    Culture FEW STAPHYLOCOCCUS AUREUS  Final   Report Status PENDING  Incomplete  Culture, blood (Routine x 2)     Status: None (Preliminary result)   Collection Time: 06/20/22  2:26 PM   Specimen: BLOOD  Result Value Ref Range Status   Specimen Description BLOOD BLOOD LEFT FOREARM  Final   Special Requests   Final    BOTTLES DRAWN AEROBIC AND ANAEROBIC Blood Culture adequate volume   Culture   Final    NO GROWTH 2 DAYS Performed at [facility], [address], [place], [place] [address]    Report Status PENDING  Incomplete  Culture, blood (Routine x 2)     Status: None (Preliminary result)   Collection Time: 06/20/22  4:58 PM   Specimen: BLOOD  Result Value Ref Range Status   Specimen Description BLOOD BLOOD LEFT ARM  Final   Special Requests   Final    BOTTLES DRAWN AEROBIC AND ANAEROBIC Blood Culture adequate volume   Culture   Final    NO GROWTH 2 DAYS Performed at [facility] [address], [place], [place] [address]    Report Status PENDING  Incomplete  MRSA Next Gen by PCR, Nasal     Status: None   Collection Time: 06/20/22  4:58 PM   Specimen: Nasal Mucosa; Nasal Swab  Result Value Ref Range Status   MRSA by PCR Next Gen NOT DETECTED NOT DETECTED Final    Comment: (NOTE) The GeneXpert MRSA Assay (FDA approved for NASAL  specimens only), is one component of a comprehensive MRSA colonization surveillance program. It is not intended to diagnose MRSA infection nor to guide or monitor treatment for MRSA infections. Test performance is not FDA approved in patients less than 8 years old. Performed at [facility], [address] [place], [place] [address]   Aerobic/Anaerobic Culture w Gram Stain (surgical/deep wound)     Status: None (Preliminary result)   Collection Time: 06/21/22  6:54 PM   Specimen: PATH Other; Tissue  Result Value Ref Range Status   Specimen Description   Final    BONE Performed at [facility], [address] [place], [place] [address]    Special Requests LEFT 2ND METATARSAL  Final   Gram Stain   Final    NO SQUAMOUS EPITHELIAL CELLS SEEN NO WBC SEEN NO ORGANISMS SEEN Performed at [facility], [address] [place], [place] [address]    Culture PENDING  Incomplete   Report Status PENDING  Incomplete  Aerobic/Anaerobic Culture w Gram Stain (surgical/deep wound)     Status: None (Preliminary result)   Collection Time: 06/21/22  6:55 PM   Specimen: PATH Other; Tissue  Result Value Ref Range Status   Specimen Description WOUND LEFT FOOT  Final   Special Requests DEEP  Final   Gram Stain   Final    NO SQUAMOUS EPITHELIAL CELLS SEEN NO WBC SEEN NO ORGANISMS SEEN Performed at [facility], [address] [place], [place] [address]    Culture PENDING  Incomplete   Report Status PENDING  Incomplete  Aerobic/Anaerobic Culture w Gram Stain (surgical/deep wound)     Status: None (Preliminary result)   Collection Time: 06/21/22  6:55 PM   Specimen: PATH Other; Wound  Result Value Ref Range Status   Specimen Description   Final    BONE Performed at [facility], [address] [place], [place] [address]    Special Requests 1ST METATARSAL LEFT  Final   Gram Stain   Final    NO SQUAMOUS EPITHELIAL CELLS SEEN RARE WBC SEEN NO  ORGANISMS SEEN Performed at [facility], [place] [address] [place],  [address]    Culture PENDING  Incomplete   Report Status PENDING  Incomplete    IMAGING RESULTS: MRI foot Multiple subcutaneous abscesses, diffuse osteomyelitis involving the first and second met I have personally reviewed the films ? Impression/Recommendation ?Diabetic foot infection with left foot abscesses and osteomyelitis of the first and second met head S/p TMA Superficial culture staph aureus Deep surgical cultures pending Continue vanco and cefepime until result May need 4 weeks of IV- depends on pathology and his progress  DM- management as per primary team ? ? ___________________________________________________ Discussed with patient, and Dr.Cline ID will follow him peripherally this weekend. Call if needed Note:  This document was prepared using Dragon voice recognition software and may include unintentional dictation errors.

## 2022-06-22 NOTE — Progress Notes (Signed)
Progress Note   Patient: [name] [id number]:[id number] DOB: [birth year] DOA: 06/20/2022     2 DOS: the patient was seen and examined on 06/22/2022   Brief hospital course: 61 year old male with past medical history of insulin-dependent type 2 diabetes, history of osteomyelitis status post amputation of the left first and second toes in January 2022, hyperlipidemia, GERD, obesity who has a 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  He had recently been treated with 14-day course of Keflex as an outpatient.  Presented to the ED on 06/20/2021 after being seen in clinic where the wound was noted to have become more red swollen and with increased drainage.    Patient mated to the hospital for IV antibiotics.  Podiatry consulted with plans to take patient to the OR for debridement.  Podiatry feels patient may ultimately require transmetatarsal amputation.  Assessment and Plan: * Diabetic foot infection (HCC) See acute osteomyelitis  Transaminitis Presented with AST 193, ALT 187 with otherwise normal hepatic function other than albumin 3.0.  Acute hepatitis panel negative.  Patient denies alcohol use.  Right upper quadrant ultrasound showed cholelithiasis without signs of acute cholecystitis or biliary ductal dilation.  Liver appeared normal on ultrasound.  Patient has no abdominal pain nausea or vomiting.  AST improved to 151 ALT 189 from 187 -- Monitor LFTs  GERD (gastroesophageal reflux disease) Continue PPI  Osteomyelitis of foot, left, acute (HCC) Due to nonhealing diabetic foot ulcer.  Presented with worsening swelling and drainage despite 14-day course of Keflex.  MRI of the left foot shows severe diffuse cellulitis mostly over the medial aspect of the foot, multiple subcutaneous abscesses, diffuse osteomyelitis involving the first and second metatarsals, as well as severe midfoot degenerative changes felt to be neuropathic. -- Podiatry following -- Going to the OR for  extensive debridement this evening -- Hold heparin VTE ppx, place SCD's -- Continue IV vancomycin and cefepime -- ID consult -- Monitor fever curve, CBC -- Follow-up podiatry as recommendations postop -- N.p.o., resume diet after surgery this evening  Type 2 diabetes mellitus with diabetic neuropathy, with long-term current use of insulin (HCC) Well-controlled with last A1c 6.2%.  Hold oral medications.  Cover with sliding scale NovoLog for now  Hyperlipidemia associated with type 2 diabetes mellitus (HCC) Lipitor on hold due to transaminitis        Subjective: Patient awake sitting up in bed eating breakfast when seen on rounds this morning.  He reports pain is adequately controlled.  Denies fevers chills.  Had some indigestion and dyspepsia earlier but currently feeling better.  No other acute complaints at this time.  Physical Exam: Vitals:   06/21/22 2127 06/22/22 0431 06/22/22 0900 06/22/22 1617  BP: 121/68 134/73 135/74 140/90  Pulse: 70 75 72 78  Resp: 16 17 18 18   Temp: 97.7 F (36.5 C) 98.1 F (36.7 C) 98.4 F (36.9 C) 99.5 F (37.5 C)  TempSrc:    Oral  SpO2: 96% 94% 96% 94%  Weight:      Height:       General exam: awake, alert, no acute distressmembranes, hearing grossly normal  Respiratory system: On room air, normal respiratory effort, lungs clear. Cardiovascular system: Regular rhythm, no peripheral edema.   Gastrointestinal system: soft, nontender nondistended abdomen. Central nervous system: A&O x3. no gross focal neurologic deficits, normal speech Extremities: Left foot with clean dry intact dressing and Ace wrap Skin: dry, intact, normal temperature Psychiatry: normal mood, congruent affect, judgement and insight appear  normal   Data Reviewed: Notable labs: Glucose 187, calcium 8.0 with albumin 2.5, AST 72 improving, ALT 142 improving, WBC 11.7, hemoglobin 10.1  Micro --operative bone cultures too young to read.  Admission blood culture negative to  date.   Family Communication: Brother was at bedside on rounds 7/13.  None present today, will attempt to call  Disposition: Status is: Inpatient Remains inpatient appropriate because: On empiric IV antibiotics pending culture results   Planned Discharge Destination: Home    Time spent: 40 minutes  Author: Pennie Banter, DO 06/22/2022 4:18 PM  For on call review [email or web address].

## 2022-06-22 NOTE — Progress Notes (Signed)
Pharmacy - Brief Note (vancomycin dosing)  Adjust vancomycin to 1500mg  IV q12h.  Adjust dose for age and expected CrCl for age Predicted AUC 467 (goal 400-600) SCr was rounded to 0.9 for CrCl ~17ml/min (normalized CrCl) - monitor renal fx - check on Sunday - follow cultures - check levels if vancomycin to continue beyond 5 days   Friday, PharmD, BCPS, BCIDP Work Cell: [phone] 06/22/2022 1:53 PM

## 2022-06-22 NOTE — Consult Note (Signed)
Pharmacy Antibiotic Note  [name] is a 61 y.o. male admitted on 06/20/2022 with  DFI / concern for osteomyelitis .  Pharmacy has been consulted for vancomycin dosing. Patient is also ordered cefepime.  Wound culture from I&D and second metatarsal is still pending finalization. Per podiatry, will continue empiric therapy for at least another 24 hours or until cultures have finalized.  Plan:  Continue Vancomycin 1.75 g IV q12h --Calculated AUC: 486.5, Cmin 13.1 --BMI 31; Used Vd 0.72 --Daily Scr per protocol --Will order levels once at steady state  Height: 6' (182.9 cm) Weight: 105.2 kg (231 lb 14.8 oz) IBW/kg (Calculated) : 77.6  Temp (24hrs), Avg:98 F (36.7 C), Min:97.5 F (36.4 C), Max:98.7 F (37.1 C)  Recent Labs  Lab 06/20/22 1426 06/20/22 1658 06/21/22 0526 06/22/22 0602  WBC 13.9*  --  9.4 11.7*  CREATININE 1.18  --  0.86 0.78  LATICACIDVEN 1.0 0.9  --   --      Estimated Creatinine Clearance: 124.6 mL/min (by C-G formula based on SCr of 0.78 mg/dL).    No Known Allergies  Antimicrobials this admission: Cefepime 7/12 >>  Vancomycin 7/12 >>   Dose adjustments this admission: N/A  Microbiology results: 7/12 BCx: NGTD 7/12 MRSA PCR: (-) 7/13 bone tissue cx: NG<24hrs 7/13 left foot would dx: collected  Thank you for allowing pharmacy to be a part of this patient's care.  Brogan England A Joseph Bias 06/22/2022 1:25 PM

## 2022-06-23 DIAGNOSIS — M86172 Other acute osteomyelitis, left ankle and foot: Secondary | ICD-10-CM

## 2022-06-23 LAB — CBC
HCT: 30.7 % — ABNORMAL LOW (ref 39.0–52.0)
Hemoglobin: 10.1 g/dL — ABNORMAL LOW (ref 13.0–17.0)
MCH: 30.1 pg (ref 26.0–34.0)
MCHC: 32.9 g/dL (ref 30.0–36.0)
MCV: 91.6 fL (ref 80.0–100.0)
Platelets: 274 10*3/uL (ref 150–400)
RBC: 3.35 MIL/uL — ABNORMAL LOW (ref 4.22–5.81)
RDW: 12.1 % (ref 11.5–15.5)
WBC: 10.7 10*3/uL — ABNORMAL HIGH (ref 4.0–10.5)
nRBC: 0 % (ref 0.0–0.2)

## 2022-06-23 LAB — COMPREHENSIVE METABOLIC PANEL
ALT: 94 U/L — ABNORMAL HIGH (ref 0–44)
AST: 28 U/L (ref 15–41)
Albumin: 2.5 g/dL — ABNORMAL LOW (ref 3.5–5.0)
Alkaline Phosphatase: 63 U/L (ref 38–126)
Anion gap: 4 — ABNORMAL LOW (ref 5–15)
BUN: 12 mg/dL (ref 6–20)
CO2: 25 mmol/L (ref 22–32)
Calcium: 8.1 mg/dL — ABNORMAL LOW (ref 8.9–10.3)
Chloride: 108 mmol/L (ref 98–111)
Creatinine, Ser: 0.82 mg/dL (ref 0.61–1.24)
GFR, Estimated: >60 mL/min (ref >60)
Glucose, Bld: 149 mg/dL — ABNORMAL HIGH (ref 70–99)
Potassium: 4.1 mmol/L (ref 3.5–5.1)
Sodium: 137 mmol/L (ref 135–145)
Total Bilirubin: 0.4 mg/dL (ref 0.3–1.2)
Total Protein: 6.9 g/dL (ref 6.5–8.1)

## 2022-06-23 LAB — GLUCOSE, CAPILLARY
Glucose-Capillary: 155 mg/dL — ABNORMAL HIGH (ref 70–99)
Glucose-Capillary: 178 mg/dL — ABNORMAL HIGH (ref 70–99)
Glucose-Capillary: 214 mg/dL — ABNORMAL HIGH (ref 70–99)
Glucose-Capillary: 199 mg/dL — ABNORMAL HIGH (ref 70–99)

## 2022-06-23 LAB — MAGNESIUM: Magnesium: 1.9 mg/dL (ref 1.7–2.4)

## 2022-06-23 NOTE — Progress Notes (Signed)
2 Days Post-Op   Subjective/Chief Complaint: Patient seen.  Still complains of some pain in the left foot but manageable with the pain medication.   Objective: Vital signs in last 24 hours: Temp:  [98.2 F (36.8 C)-99.5 F (37.5 C)] 98.5 F (36.9 C) (07/15 0823) Pulse Rate:  [76-81] 79 (07/15 0823) Resp:  [18-21] 18 (07/15 0823) BP: (130-144)/(60-90) 144/77 (07/15 0823) SpO2:  [93 %-95 %] 95 % (07/15 0823) Last BM Date : 06/20/22  Intake/Output from previous day: 07/14 0701 - 07/15 0700 In: 1164.8 [IV Piggyback:1164.8] Out: 1100 [Urine:1100] Intake/Output this shift: Total I/O In: 360 [P.O.:360] Out: -   Dressing is dry and intact.  There is some mild drainage from the middle portion of the transmetatarsal incision.  Incision is well coapted but there does appear to be some early necrosis on the dorsal flap.  Moderate drainage from the open wound with some purulence on the packing.  Lab Results:  Recent Labs    06/22/22 0602 06/23/22 0526  WBC 11.7* 10.7*  HGB 10.1* 10.1*  HCT 31.2* 30.7*  PLT 279 274   BMET Recent Labs    06/22/22 0602 06/23/22 0526  NA 137 137  K 4.4 4.1  CL 109 108  CO2 25 25  GLUCOSE 187* 149*  BUN 13 12  CREATININE 0.78 0.82  CALCIUM 8.0* 8.1*   PT/INR No results for input(s): "LABPROT", "INR" in the last 72 hours. ABG No results for input(s): "PHART", "HCO3" in the last 72 hours.  Invalid input(s): "PCO2", "PO2"  Studies/Results: DG MINI C-ARM IMAGE ONLY  Result Date: 06/21/2022 There is no interpretation for this exam.  This order is for images obtained during a surgical procedure.  Please See "Surgeries" Tab for more information regarding the procedure.    Anti-infectives: Anti-infectives (From admission, onward)    Start     Dose/Rate Route Frequency Ordered Stop   06/22/22 2200  vancomycin (VANCOREADY) IVPB 1500 mg/300 mL        1,500 mg 150 mL/hr over 120 Minutes Intravenous Every 12 hours 06/22/22 1350     06/21/22  2000  vancomycin (VANCOREADY) IVPB 1750 mg/350 mL  Status:  Discontinued        1,750 mg 175 mL/hr over 120 Minutes Intravenous Every 12 hours 06/21/22 1321 06/22/22 1350   06/21/22 0800  vancomycin (VANCOREADY) IVPB 1250 mg/250 mL  Status:  Discontinued        1,250 mg 166.7 mL/hr over 90 Minutes Intravenous Every 12 hours 06/20/22 2000 06/21/22 1321   06/20/22 2200  ceFEPIme (MAXIPIME) 2 g in sodium chloride 0.9 % 100 mL IVPB        2 g 200 mL/hr over 30 Minutes Intravenous Every 8 hours 06/20/22 1927     06/20/22 1700  vancomycin (VANCOREADY) IVPB 1500 mg/300 mL       See Hyperspace for full Linked Orders Report.   1,500 mg 150 mL/hr over 120 Minutes Intravenous  Once 06/20/22 1656 06/20/22 2003   06/20/22 1700  vancomycin (VANCOCIN) IVPB 1000 mg/200 mL premix       See Hyperspace for full Linked Orders Report.   1,000 mg 200 mL/hr over 60 Minutes Intravenous  Once 06/20/22 1656 06/20/22 2109   06/20/22 1700  ceFEPIme (MAXIPIME) 2 g in sodium chloride 0.9 % 100 mL IVPB        2 g 200 mL/hr over 30 Minutes Intravenous  Once 06/20/22 1657 06/20/22 1755       Assessment/Plan: s/p Procedure(s):  IRRIGATION AND DEBRIDEMENT ABSCESS (Left) TRANSMETATARSAL AMPUTATION-1st and 2nd (Left) Assessment: Status post I&D with transmetatarsal amputation left foot, condition guarded.  Plan: Sterile saline gauze packed into the open dorsal wound.  Betadine and sterile bandaging applied to the transmetatarsal amputation site followed by bulky bandage.  Plan for reevaluation tomorrow.  Patient may need washout and revision of his amputation site and will continue to closely monitor  LOS: 3 days    Ricci Barker 06/23/2022

## 2022-06-23 NOTE — Progress Notes (Signed)
Progress Note   Patient: [name] [id number] DOB: 11/08/1961 DOA: 06/20/2022     3 DOS: the patient was seen and examined on 06/23/2022   Brief hospital course: 61 year old male with past medical history of insulin-dependent type 2 diabetes, history of osteomyelitis status post amputation of the left first and second toes in January 2022, hyperlipidemia, GERD, obesity who has a 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  He had recently been treated with 14-day course of Keflex as an outpatient.  Presented to the ED on 06/20/2021 after being seen in clinic where the wound was noted to have become more red swollen and with increased drainage.    Patient mated to the hospital for IV antibiotics.  Podiatry consulted with plans to take patient to the OR for debridement.  Podiatry feels patient may ultimately require transmetatarsal amputation.  Assessment and Plan: * Osteomyelitis of foot, left, acute (HCC) Due to nonhealing diabetic foot ulcer.  Presented with worsening swelling and drainage despite 14-day course of Keflex.  MRI of the left foot shows severe diffuse cellulitis mostly over the medial aspect of the foot, multiple subcutaneous abscesses, diffuse osteomyelitis involving the first and second metatarsals, as well as severe midfoot degenerative changes felt to be neuropathic. -- Podiatry following -- Going to the OR for extensive debridement this evening -- Hold heparin VTE ppx, place SCD's -- Continue IV vancomycin and cefepime -- ID consult -- Monitor fever curve, CBC -- Follow-up podiatry as recommendations postop -- N.p.o., resume diet after surgery this evening  Transaminitis Presented with AST 193, ALT 187 with otherwise normal hepatic function other than albumin 3.0.  Acute hepatitis panel negative.  Patient denies alcohol use.  Right upper quadrant ultrasound showed cholelithiasis without signs of acute cholecystitis or biliary ductal dilation.   Liver appeared normal on ultrasound.  Patient has no abdominal pain nausea or vomiting.  AST normalized ALT improved, 94 -- Monitor LFTs  GERD (gastroesophageal reflux disease) Continue PPI  Diabetic foot infection (HCC) See acute osteomyelitis  Type 2 diabetes mellitus with diabetic neuropathy, with long-term current use of insulin (HCC) Well-controlled with last A1c 6.2%.  Hold oral medications.  Cover with sliding scale NovoLog for now  Hyperlipidemia associated with type 2 diabetes mellitus (HCC) Lipitor on hold due to transaminitis        Subjective: Patient awake sitting up in bed, seen during breakfast this morning on rounds.  He reports intermittently uncontrolled pain but otherwise feeling fairly well.  Says he slept okay.  He is worried about podiatry his next visit and assessment of his foot, worried about requiring further amputation.  Physical Exam: Vitals:   06/22/22 1617 06/22/22 2006 06/23/22 0414 06/23/22 0823  BP: 140/90 130/60 (!) 142/72 (!) 144/77  Pulse: 78 81 76 79  Resp: 18 (!) 21 20 18   Temp: 99.5 F (37.5 C) 98.6 F (37 C) 98.2 F (36.8 C) 98.5 F (36.9 C)  TempSrc: Oral Oral Oral   SpO2: 94% 93% 94% 95%  Weight:      Height:       General exam: awake, alert, no acute distressmembranes, hearing grossly normal  Respiratory system: On room air, normal respiratory effort, lungs clear. Cardiovascular system: Regular rhythm, no peripheral edema.   Gastrointestinal system: soft, nontender nondistended abdomen. Central nervous system: A&O x3. no gross focal neurologic deficits, normal speech Extremities: Left foot with clean dry intact dressing and Ace wrap Skin: dry, intact, normal temperature Psychiatry: normal mood, congruent affect,  judgement and insight appear normal   Data Reviewed: Notable labs: Glucose 149, calcium 8.1, anion gap 4, albumin 2.5, ALT 94 improved, AST normalized, WBC improved 10.7, hemoglobin stable 10.1  Micro --operative  bone cultures reintubated for better growth so far no organisms seen   Family Communication: Brother was at bedside on rounds 7/13.  None present today, will attempt to call  Disposition: Status is: Inpatient Remains inpatient appropriate because: On empiric IV antibiotics pending culture results   Planned Discharge Destination: Home    Time spent: 35 minutes  Author: Pennie Banter, DO 06/23/2022 3:20 PM  For on call review [email or web address].

## 2022-06-24 DIAGNOSIS — M86172 Other acute osteomyelitis, left ankle and foot: Secondary | ICD-10-CM | POA: Diagnosis not present

## 2022-06-24 DIAGNOSIS — I10 Essential (primary) hypertension: Secondary | ICD-10-CM

## 2022-06-24 LAB — CBC
HCT: 32.5 % — ABNORMAL LOW (ref 39.0–52.0)
Hemoglobin: 11.1 g/dL — ABNORMAL LOW (ref 13.0–17.0)
MCH: 31.2 pg (ref 26.0–34.0)
MCHC: 34.2 g/dL (ref 30.0–36.0)
MCV: 91.3 fL (ref 80.0–100.0)
Platelets: 282 10*3/uL (ref 150–400)
RBC: 3.56 MIL/uL — ABNORMAL LOW (ref 4.22–5.81)
RDW: 11.9 % (ref 11.5–15.5)
WBC: 11.3 10*3/uL — ABNORMAL HIGH (ref 4.0–10.5)
nRBC: 0 % (ref 0.0–0.2)

## 2022-06-24 LAB — GLUCOSE, CAPILLARY
Glucose-Capillary: 162 mg/dL — ABNORMAL HIGH (ref 70–99)
Glucose-Capillary: 238 mg/dL — ABNORMAL HIGH (ref 70–99)
Glucose-Capillary: 198 mg/dL — ABNORMAL HIGH (ref 70–99)
Glucose-Capillary: 207 mg/dL — ABNORMAL HIGH (ref 70–99)

## 2022-06-24 LAB — BASIC METABOLIC PANEL
Anion gap: 7 (ref 5–15)
BUN: 11 mg/dL (ref 6–20)
CO2: 25 mmol/L (ref 22–32)
Calcium: 8.6 mg/dL — ABNORMAL LOW (ref 8.9–10.3)
Chloride: 106 mmol/L (ref 98–111)
Creatinine, Ser: 0.69 mg/dL (ref 0.61–1.24)
GFR, Estimated: >60 mL/min (ref >60)
Glucose, Bld: 151 mg/dL — ABNORMAL HIGH (ref 70–99)
Potassium: 4 mmol/L (ref 3.5–5.1)
Sodium: 138 mmol/L (ref 135–145)

## 2022-06-24 MED ORDER — CEFAZOLIN SODIUM-DEXTROSE 2-4 GM/100ML-% IV SOLN
2.0000 g | Freq: Three times a day (TID) | INTRAVENOUS | Status: DC
  Administered 2022-06-24 – 2022-06-27 (×9): 2 g via INTRAVENOUS
  Filled 2022-06-24 (×9): qty 100

## 2022-06-24 MED ORDER — HYDRALAZINE HCL 10 MG PO TABS: 10.0000 mg | ORAL_TABLET | Freq: Four times a day (QID) | ORAL | Status: DC | PRN

## 2022-06-24 MED ORDER — AMLODIPINE BESYLATE 5 MG PO TABS
5.0000 mg | ORAL_TABLET | Freq: Every day | ORAL | Status: DC
  Administered 2022-06-24 – 2022-06-27 (×4): 5 mg via ORAL
  Filled 2022-06-24 (×4): qty 1

## 2022-06-24 NOTE — Plan of Care (Signed)
  Problem: Pain Managment: Goal: General experience of comfort will improve Outcome: Progressing   Problem: Safety: Goal: Ability to remain free from injury will improve Outcome: Progressing   

## 2022-06-24 NOTE — Progress Notes (Signed)
3 Days Post-Op   Subjective/Chief Complaint: Patient seen.  States pain is well controlled.   Objective: Vital signs in last 24 hours: Temp:  [97.5 F (36.4 C)-99.5 F (37.5 C)] 98 F (36.7 C) (07/16 0807) Pulse Rate:  [72-80] 78 (07/16 0807) Resp:  [16-18] 16 (07/16 0807) BP: (159-182)/(71-86) 162/86 (07/16 0807) SpO2:  [94 %-100 %] 100 % (07/16 0807) Last BM Date : 06/20/22  Intake/Output from previous day: 07/15 0701 - 07/16 0700 In: 660 [P.O.:360; IV Piggyback:300] Out: 2900 [Urine:2900] Intake/Output this shift: Total I/O In: 240 [P.O.:240] Out: 900 [Urine:900]  Minimal bleeding and drainage noted from the transmetatarsal amputation site.  Some moderate drainage from the dorsal I&D site with some purulence still noted on the packing.  Some mild incisional early necrosis changes noted mostly along the dorsal flap of the transmetatarsal amputation site.  No expressible drainage or purulence.  Erythema has significantly improved along the dorsum of the foot.     Lab Results:  Recent Labs    06/23/22 0526 06/24/22 0406  WBC 10.7* 11.3*  HGB 10.1* 11.1*  HCT 30.7* 32.5*  PLT 274 282   BMET Recent Labs    06/23/22 0526 06/24/22 0406  NA 137 138  K 4.1 4.0  CL 108 106  CO2 25 25  GLUCOSE 149* 151*  BUN 12 11  CREATININE 0.82 0.69  CALCIUM 8.1* 8.6*   PT/INR No results for input(s): "LABPROT", "INR" in the last 72 hours. ABG No results for input(s): "PHART", "HCO3" in the last 72 hours.  Invalid input(s): "PCO2", "PO2"  Studies/Results: No results found.  Anti-infectives: Anti-infectives (From admission, onward)    Start     Dose/Rate Route Frequency Ordered Stop   06/22/22 2200  vancomycin (VANCOREADY) IVPB 1500 mg/300 mL        1,500 mg 150 mL/hr over 120 Minutes Intravenous Every 12 hours 06/22/22 1350     06/21/22 2000  vancomycin (VANCOREADY) IVPB 1750 mg/350 mL  Status:  Discontinued        1,750 mg 175 mL/hr over 120 Minutes Intravenous  Every 12 hours 06/21/22 1321 06/22/22 1350   06/21/22 0800  vancomycin (VANCOREADY) IVPB 1250 mg/250 mL  Status:  Discontinued        1,250 mg 166.7 mL/hr over 90 Minutes Intravenous Every 12 hours 06/20/22 2000 06/21/22 1321   06/20/22 2200  ceFEPIme (MAXIPIME) 2 g in sodium chloride 0.9 % 100 mL IVPB        2 g 200 mL/hr over 30 Minutes Intravenous Every 8 hours 06/20/22 1927     06/20/22 1700  vancomycin (VANCOREADY) IVPB 1500 mg/300 mL       See Hyperspace for full Linked Orders Report.   1,500 mg 150 mL/hr over 120 Minutes Intravenous  Once 06/20/22 1656 06/20/22 2003   06/20/22 1700  vancomycin (VANCOCIN) IVPB 1000 mg/200 mL premix       See Hyperspace for full Linked Orders Report.   1,000 mg 200 mL/hr over 60 Minutes Intravenous  Once 06/20/22 1656 06/20/22 2109   06/20/22 1700  ceFEPIme (MAXIPIME) 2 g in sodium chloride 0.9 % 100 mL IVPB        2 g 200 mL/hr over 30 Minutes Intravenous  Once 06/20/22 1657 06/20/22 1755       Assessment/Plan: s/p Procedure(s): IRRIGATION AND DEBRIDEMENT ABSCESS (Left) TRANSMETATARSAL AMPUTATION-1st and 2nd (Left) Assessment: Status post I&D and transmetatarsal amputation left foot, condition guarded.  Plan: Betadine dressing applied to the transmetatarsal amputation site  with sterile saline gauze packed within the dorsal wound.  Bulky bandage applied.  Patient may have bathroom privileges with assistance in his surgical shoe with pressure only on the heel.  At this point I think the patient may be stable for discharge pending antibiotic recommendation from infectious disease.  Cultures appear to show Staph aureus, sensitivities not returned yet.  Plan for follow-up later this week outpatient.  LOS: 4 days    Ricci Barker 06/24/2022

## 2022-06-24 NOTE — TOC CM/SW Note (Addendum)
Per ID note, patient may need 4 weeks IV abx depending on pathology and progress. Sent secure email to Jeri Modena with [employer] to notify.  Charlynn Court, CSW [phone]  2:30 pm: Since patient has [employer], it is unlikely that we will be able to find a home health RN for IV abx. [employer] representative will reach out to [employer] for RN.  Charlynn Court, CSW [phone]

## 2022-06-24 NOTE — Assessment & Plan Note (Signed)
I do not see a formal diagnosis of hypertension, BPs have been mildly elevated here possibly due to pain. -- Start 5 mg amlodipine -- Hydralazine as needed

## 2022-06-24 NOTE — Progress Notes (Addendum)
Progress Note   Patient: [name] [id number] DOB: October 15, 1961 DOA: 06/20/2022     4 DOS: the patient was seen and examined on 06/24/2022   Brief hospital course: 61 year old male with past medical history of insulin-dependent type 2 diabetes, history of osteomyelitis status post amputation of the left first and second toes in January 2022, hyperlipidemia, GERD, obesity who has a 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  He had recently been treated with 14-day course of Keflex as an outpatient.  Presented to the ED on 06/20/2021 after being seen in clinic where the wound was noted to have become more red swollen and with increased drainage.    Patient mated to the hospital for IV antibiotics.  Podiatry consulted with plans to take patient to the OR for debridement.  Podiatry feels patient may ultimately require transmetatarsal amputation.  Assessment and Plan: * Osteomyelitis of foot, left, acute (HCC) Due to nonhealing diabetic foot ulcer.  Presented with worsening swelling and drainage despite 14-day course of Keflex.  MRI of the left foot shows severe diffuse cellulitis mostly over the medial aspect of the foot, multiple subcutaneous abscesses, diffuse osteomyelitis involving the first and second metatarsals, as well as severe midfoot degenerative changes felt to be neuropathic. Initially treated with empiric IV vancomycin and cefepime. 7/13: Underwent transmetatarsal amputation of left foot I&D of abscesses, culture sent 7/16: Operative culture growing MSSA -- Podiatry following -- Transition antibiotic to IV cefazolin -- ID following, appreciate recommendations -- Monitor fever curve, CBC -- Follow-up podiatry's recommendations   Essential hypertension I do not see a formal diagnosis of hypertension, BPs have been mildly elevated here possibly due to pain. -- Start 5 mg amlodipine -- Hydralazine as needed  Transaminitis Presented with AST 193, ALT 187  with otherwise normal hepatic function other than albumin 3.0.  Acute hepatitis panel negative.  Patient denies alcohol use.  Right upper quadrant ultrasound showed cholelithiasis without signs of acute cholecystitis or biliary ductal dilation.  Liver appeared normal on ultrasound.  Patient has no abdominal pain nausea or vomiting.  AST normalized ALT improved, 94 -- Monitor LFTs  GERD (gastroesophageal reflux disease) Continue PPI  Diabetic foot infection (HCC) See acute osteomyelitis  Type 2 diabetes mellitus with diabetic neuropathy, with long-term current use of insulin (HCC) Well-controlled with last A1c 6.2%.  Hold oral medications.  Cover with sliding scale NovoLog for now  Hyperlipidemia associated with type 2 diabetes mellitus (HCC) Lipitor on hold due to transaminitis        Subjective: Patient awake resting in bed when seen on rounds today.  He reports pain overall well controlled but increases at times, he hopes to go home soon.  Denies other acute complaints.  Physical Exam: Vitals:   06/23/22 1601 06/23/22 1937 06/24/22 0413 06/24/22 0807  BP: (!) 182/85 (!) 159/71 (!) 161/76 (!) 162/86  Pulse: 74 80 72 78  Resp: 18 16 16 16   Temp: 99.5 F (37.5 C) 98.6 F (37 C) (!) 97.5 F (36.4 C) 98 F (36.7 C)  TempSrc:  Oral Oral Oral  SpO2: 96% 97% 94% 100%  Weight:      Height:       General exam: awake, alert, no acute distressmembranes, hearing grossly normal  Respiratory system: On room air, normal respiratory effort, lungs clear. Cardiovascular system: Regular rhythm, no peripheral edema.   Gastrointestinal system: soft, nontender nondistended abdomen. Central nervous system: A&O x3. no gross focal neurologic deficits, normal speech Extremities: Left foot  with clean dry intact dressing and Ace wrap Skin: dry, intact, normal temperature Psychiatry: normal mood, congruent affect, judgement and insight appear normal   Data Reviewed: Notable labs: Glucose 151,  calcium 8.6, WBC 11.3 from 10.7, hemoglobin 11.1 from 10.1  Micro --operative bone cultures growing MSSA   Family Communication: Brother was at bedside on rounds 7/13.  None present today, will attempt to call  Disposition: Status is: Inpatient Remains inpatient appropriate because: Remains on IV antibiotics for osteomyelitis, podiatry monitoring wound healing in case of any need for further surgery, awaiting definitive antibiotic plan   Planned Discharge Destination: Home    Time spent: 35 minutes  Author: Pennie Banter, DO 06/24/2022 2:15 PM  For on call review [email or web address].

## 2022-06-25 DIAGNOSIS — M86172 Other acute osteomyelitis, left ankle and foot: Secondary | ICD-10-CM | POA: Diagnosis not present

## 2022-06-25 DIAGNOSIS — A4901 Methicillin susceptible Staphylococcus aureus infection, unspecified site: Secondary | ICD-10-CM

## 2022-06-25 LAB — CULTURE, BLOOD (ROUTINE X 2)
Culture: NO GROWTH
Culture: NO GROWTH
Special Requests: ADEQUATE
Special Requests: ADEQUATE

## 2022-06-25 LAB — CBC
HCT: 33 % — ABNORMAL LOW (ref 39.0–52.0)
Hemoglobin: 11 g/dL — ABNORMAL LOW (ref 13.0–17.0)
MCH: 30 pg (ref 26.0–34.0)
MCHC: 33.3 g/dL (ref 30.0–36.0)
MCV: 89.9 fL (ref 80.0–100.0)
Platelets: 267 10*3/uL (ref 150–400)
RBC: 3.67 MIL/uL — ABNORMAL LOW (ref 4.22–5.81)
RDW: 12 % (ref 11.5–15.5)
WBC: 9.3 10*3/uL (ref 4.0–10.5)
nRBC: 0 % (ref 0.0–0.2)

## 2022-06-25 LAB — GLUCOSE, CAPILLARY
Glucose-Capillary: 165 mg/dL — ABNORMAL HIGH (ref 70–99)
Glucose-Capillary: 204 mg/dL — ABNORMAL HIGH (ref 70–99)
Glucose-Capillary: 213 mg/dL — ABNORMAL HIGH (ref 70–99)
Glucose-Capillary: 222 mg/dL — ABNORMAL HIGH (ref 70–99)

## 2022-06-25 MED ORDER — SENNOSIDES-DOCUSATE SODIUM 8.6-50 MG PO TABS
1.0000 | ORAL_TABLET | Freq: Every evening | ORAL | Status: DC | PRN
Start: 2022-06-25 — End: 2022-06-27

## 2022-06-25 MED ORDER — ENOXAPARIN SODIUM 60 MG/0.6ML IJ SOSY
0.5000 mg/kg | PREFILLED_SYRINGE | INTRAMUSCULAR | Status: DC
  Administered 2022-06-25 – 2022-06-26 (×2): 52.5 mg via SUBCUTANEOUS
  Filled 2022-06-25 (×2): qty 0.6

## 2022-06-25 MED ORDER — POLYETHYLENE GLYCOL 3350 17 G PO PACK
17.0000 g | PACK | Freq: Every day | ORAL | Status: DC
  Administered 2022-06-25 – 2022-06-27 (×3): 17 g via ORAL
  Filled 2022-06-25 (×3): qty 1

## 2022-06-25 NOTE — Progress Notes (Signed)
   Date of Admission:  06/20/2022    ID: [name] is a 61 y.o. male  Principal Problem:   Osteomyelitis of foot, left, acute (HCC) Active Problems:   Hyperlipidemia associated with type 2 diabetes mellitus (HCC)   Type 2 diabetes mellitus with diabetic neuropathy, with long-term current use of insulin (HCC)   Diabetic foot infection (HCC)   GERD (gastroesophageal reflux disease)   Transaminitis   Essential hypertension    Subjective: Says he is feeling okay Nervous about the left foot No pain  Medications:   amLODipine  5 mg Oral Daily   insulin aspart  0-6 Units Subcutaneous TID WC    Objective: Vital signs in last 24 hours: Temp:  [97.7 F (36.5 C)-98 F (36.7 C)] 97.8 F (36.6 C) (07/17 0750) Pulse Rate:  [66-72] 68 (07/17 0750) Resp:  [18-20] 18 (07/17 0750) BP: (137-157)/(66-85) 157/85 (07/17 0750) SpO2:  [95 %-98 %] 98 % (07/17 0750) Weight:  [105.2 kg] 105.2 kg (07/17 0434)   PHYSICAL EXAM:  General: Alert, cooperative, no distress, appears stated age.  Lungs: Clear to auscultation bilaterally. No Wheezing or Rhonchi. No rales. Heart: Regular rate and rhythm, no murmur, rub or gallop. Abdomen: Soft, non-tender,not distended. Bowel sounds normal. No masses Extremities: Left TMA site- well approximated- dorsal surgical site is packed The TMA margin has some discoloration     Skin: No rashes or lesions. Or bruising Lymph: Cervical, supraclavicular normal. Neurologic: Grossly non-focal  Lab Results Recent Labs    06/23/22 0526 06/24/22 0406 06/25/22 0520  WBC 10.7* 11.3* 9.3  HGB 10.1* 11.1* 11.0*  HCT 30.7* 32.5* 33.0*  NA 137 138  --   K 4.1 4.0  --   CL 108 106  --   CO2 25 25  --   BUN 12 11  --   CREATININE 0.82 0.69  --    Liver Panel Recent Labs    06/23/22 0526  PROT 6.9  ALBUMIN 2.5*  AST 28  ALT 94*  ALKPHOS 63  BILITOT 0.4    Microbiology: Wound culture - MSSA Studies/Results: No results  found.   Assessment/Plan: Diabetic foot infection with left foot abscess and osteo of the first and 2nd met head-  S/p TMA- culture MSSA Bone pathology pending Broad spectrum antibiotic de-escalated to cefazolin May need 2 -4 weeks of IV depending on the  bone margin   DM- management as per primary team  PT seen with Dr.Cline      

## 2022-06-25 NOTE — Progress Notes (Signed)
4 Days Post-Op   Subjective/Chief Complaint: Patient seen.  Still some pain but states it is improving.   Objective: Vital signs in last 24 hours: Temp:  [97.7 F (36.5 C)-98 F (36.7 C)] 97.8 F (36.6 C) (07/17 0750) Pulse Rate:  [66-72] 68 (07/17 0750) Resp:  [18-20] 18 (07/17 0750) BP: (137-157)/(66-85) 157/85 (07/17 0750) SpO2:  [95 %-98 %] 98 % (07/17 0750) Weight:  [105.2 kg] 105.2 kg (07/17 0434) Last BM Date : 06/20/22  Intake/Output from previous day: 07/16 0701 - 07/17 0700 In: 480 [P.O.:480] Out: 2650 [Urine:2650] Intake/Output this shift: Total I/O In: 240 [P.O.:240] Out: 800 [Urine:800]  Minimal bleeding and drainage noted from the transmetatarsal amputation site.  Still some mild to moderate drainage from the dorsal wound with some purulence still noted on the packing.  No significant surrounding erythema still some mild incisional necrosis.   Lab Results:  Recent Labs    06/24/22 0406 06/25/22 0520  WBC 11.3* 9.3  HGB 11.1* 11.0*  HCT 32.5* 33.0*  PLT 282 267   BMET Recent Labs    06/23/22 0526 06/24/22 0406  NA 137 138  K 4.1 4.0  CL 108 106  CO2 25 25  GLUCOSE 149* 151*  BUN 12 11  CREATININE 0.82 0.69  CALCIUM 8.1* 8.6*   PT/INR No results for input(s): "LABPROT", "INR" in the last 72 hours. ABG No results for input(s): "PHART", "HCO3" in the last 72 hours.  Invalid input(s): "PCO2", "PO2"  Studies/Results: No results found.  Anti-infectives: Anti-infectives (From admission, onward)    Start     Dose/Rate Route Frequency Ordered Stop   06/24/22 2200  ceFAZolin (ANCEF) IVPB 2g/100 mL premix        2 g 200 mL/hr over 30 Minutes Intravenous Every 8 hours 06/24/22 1337     06/22/22 2200  vancomycin (VANCOREADY) IVPB 1500 mg/300 mL  Status:  Discontinued        1,500 mg 150 mL/hr over 120 Minutes Intravenous Every 12 hours 06/22/22 1350 06/24/22 1337   06/21/22 2000  vancomycin (VANCOREADY) IVPB 1750 mg/350 mL  Status:   Discontinued        1,750 mg 175 mL/hr over 120 Minutes Intravenous Every 12 hours 06/21/22 1321 06/22/22 1350   06/21/22 0800  vancomycin (VANCOREADY) IVPB 1250 mg/250 mL  Status:  Discontinued        1,250 mg 166.7 mL/hr over 90 Minutes Intravenous Every 12 hours 06/20/22 2000 06/21/22 1321   06/20/22 2200  ceFEPIme (MAXIPIME) 2 g in sodium chloride 0.9 % 100 mL IVPB  Status:  Discontinued        2 g 200 mL/hr over 30 Minutes Intravenous Every 8 hours 06/20/22 1927 06/24/22 1337   06/20/22 1700  vancomycin (VANCOREADY) IVPB 1500 mg/300 mL       See Hyperspace for full Linked Orders Report.   1,500 mg 150 mL/hr over 120 Minutes Intravenous  Once 06/20/22 1656 06/20/22 2003   06/20/22 1700  vancomycin (VANCOCIN) IVPB 1000 mg/200 mL premix       See Hyperspace for full Linked Orders Report.   1,000 mg 200 mL/hr over 60 Minutes Intravenous  Once 06/20/22 1656 06/20/22 2109   06/20/22 1700  ceFEPIme (MAXIPIME) 2 g in sodium chloride 0.9 % 100 mL IVPB        2 g 200 mL/hr over 30 Minutes Intravenous  Once 06/20/22 1657 06/20/22 1755       Assessment/Plan: s/p Procedure(s): IRRIGATION AND DEBRIDEMENT ABSCESS (Left) TRANSMETATARSAL  AMPUTATION-1st and 2nd (Left) Assessment: Status post I&D and transmetatarsal amputation left foot.  Plan: Betadine applied to the transmetatarsal amputation site with sterile saline gauze packed within the dorsal wound.  Patient will need daily packing of the dorsal wound and will most likely have to be taught how to do this himself or a family member as apparently home health care will not be able to come out.  Plan for follow-up outpatient in 1 week.  LOS: 5 days    Ricci Barker 06/25/2022

## 2022-06-25 NOTE — Progress Notes (Signed)
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  He had recently been treated with 14-day course of Keflex as an outpatient.  Presented to the ED on 06/20/2021 after being seen in clinic where the wound was noted to have become more red swollen and with increased drainage.    Patient mated to the hospital for IV antibiotics.  Podiatry consulted with plans to take patient to the OR for debridement.  Podiatry feels patient may ultimately require transmetatarsal amputation.  Assessment and Plan: * Osteomyelitis of foot, left, acute (HCC) Due to nonhealing diabetic foot ulcer.  Presented with worsening swelling and drainage despite 14-day course of Keflex.  MRI of the left foot shows severe diffuse cellulitis mostly over the medial aspect of the foot, multiple subcutaneous abscesses, diffuse osteomyelitis involving the first and second metatarsals, as well as severe midfoot degenerative changes felt to be neuropathic. Initially treated with empiric IV vancomycin and cefepime. 7/13: Underwent transmetatarsal amputation of left foot I&D of abscesses, culture sent 7/16: Operative culture growing MSSA 7/17: final pathology results still pending from bone tissue -- Podiatry following -- Transition antibiotic to IV cefazolin -- ID following, appreciate recommendations -- Monitor fever curve, CBC -- Follow-up podiatry's recommendations   Essential hypertension I do not see a formal diagnosis of hypertension, BPs have been mildly elevated here possibly due to pain. -- Start 5 mg amlodipine -- Hydralazine  as needed  Transaminitis Presented with AST 193, ALT 187 with otherwise normal hepatic function other than albumin 3.0.  Acute hepatitis panel negative.  Patient denies alcohol use.  Right upper quadrant ultrasound showed cholelithiasis without signs of acute cholecystitis or biliary ductal dilation.  Liver appeared normal on ultrasound.  Patient has no abdominal pain nausea or vomiting.  AST normalized ALT improved, 94 -- Monitor LFTs  GERD (gastroesophageal reflux disease) Continue PPI  Diabetic foot infection (HCC) See acute osteomyelitis  Type 2 diabetes mellitus with diabetic neuropathy, with long-term current use of insulin (HCC) Well-controlled with last A1c 6.2%.  Hold oral medications.  Cover with sliding scale NovoLog for now  Hyperlipidemia associated with type 2 diabetes mellitus (HCC) Lipitor initially held due to transaminitis. Repeat CMP tomorrow and resume if LFTs are further improved.        Subjective: Patient awake resting in bed when seen on rounds today.  He reports intermittently having bouts of pain but overall its been controlled.  Had a small bowel movement this morning but feels need to go more.  Tolerated the offloading shoe with ambulation from the bed to the bathroom using a walker.   No other acute complaints at this time.  Physical Exam: Vitals:   06/24/22 1543 06/24/22 1914 06/25/22 0434 06/25/22 0750  BP: (!) 157/66 (!) 153/74 137/75 (!) 157/85  Pulse: 72 72 66 68  Resp:  20 20 18   Temp:  97.7 F (36.5 C) 98 F (36.7 C) 97.8 F (36.6 C)  TempSrc:  Oral Oral   SpO2: 98% 96% 95% 98%  Weight:   105.2 kg   Height:       General exam: awake, alert, no acute  distressmembranes, hearing grossly normal  Respiratory system: On room air, normal respiratory effort, lungs clear. Cardiovascular system: Regular rhythm, no peripheral edema.   Central nervous system: A&O x3. no gross focal neurologic deficits, normal speech Extremities: Left foot with  clean dry intact dressing and Ace wrap Skin: dry, intact, normal temperature Psychiatry: normal mood, congruent affect, judgement and insight appear normal   Data Reviewed: Notable labs: Hemoglobin 11.0 stable.  CBGs today 165, 204  Micro --operative bone cultures growing MSSA, susceptibilities pending   Family Communication: Brother was at bedside on rounds 7/13.  None present today, will attempt to call  Disposition: Status is: Inpatient Remains inpatient appropriate because: Remains on IV antibiotics for osteomyelitis with final culture susceptibilities pending, training education for wound packing and dressing changes underway   Planned Discharge Destination: Home    Time spent: 35 minutes  Author: Pennie Banter, DO 06/25/2022 3:11 PM  For on call review [email or web address].

## 2022-06-25 NOTE — TOC Progression Note (Signed)
Transition of Care [facility]) - Progression Note    Patient Details  Name: [name] MRN: [id number] Date of Birth: April 03, 1961  [facility]) CM/SW Contact  Chapman Fitch, RN Phone Number: 06/25/2022, 3:50 PM  Clinical Narrative:      Per Yvone Neu will be providing nursing care at discharge.  Per Yvone Neu could assess the patient's wound on a weekly basis, however patient/family would be responsible for daily dressing changes.  MDs and Bedside RN notified       Expected Discharge Plan and Services                                                 Social Determinants of Health (SDOH) Interventions    Readmission Risk Interventions     No data to display

## 2022-06-26 DIAGNOSIS — M86172 Other acute osteomyelitis, left ankle and foot: Secondary | ICD-10-CM | POA: Diagnosis not present

## 2022-06-26 LAB — COMPREHENSIVE METABOLIC PANEL
ALT: 77 U/L — ABNORMAL HIGH (ref 0–44)
AST: 41 U/L (ref 15–41)
Albumin: 2.7 g/dL — ABNORMAL LOW (ref 3.5–5.0)
Alkaline Phosphatase: 71 U/L (ref 38–126)
Anion gap: 6 (ref 5–15)
BUN: 15 mg/dL (ref 6–20)
CO2: 29 mmol/L (ref 22–32)
Calcium: 9 mg/dL (ref 8.9–10.3)
Chloride: 102 mmol/L (ref 98–111)
Creatinine, Ser: 0.8 mg/dL (ref 0.61–1.24)
GFR, Estimated: >60 mL/min (ref >60)
Glucose, Bld: 174 mg/dL — ABNORMAL HIGH (ref 70–99)
Potassium: 4.1 mmol/L (ref 3.5–5.1)
Sodium: 137 mmol/L (ref 135–145)
Total Bilirubin: 0.5 mg/dL (ref 0.3–1.2)
Total Protein: 8 g/dL (ref 6.5–8.1)

## 2022-06-26 LAB — GLUCOSE, CAPILLARY
Glucose-Capillary: 189 mg/dL — ABNORMAL HIGH (ref 70–99)
Glucose-Capillary: 225 mg/dL — ABNORMAL HIGH (ref 70–99)
Glucose-Capillary: 187 mg/dL — ABNORMAL HIGH (ref 70–99)
Glucose-Capillary: 212 mg/dL — ABNORMAL HIGH (ref 70–99)

## 2022-06-26 LAB — AEROBIC/ANAEROBIC CULTURE W GRAM STAIN (SURGICAL/DEEP WOUND)
Gram Stain: NONE SEEN
Gram Stain: NONE SEEN
Gram Stain: NONE SEEN

## 2022-06-26 LAB — SURGICAL PATHOLOGY

## 2022-06-26 MED ORDER — ATORVASTATIN CALCIUM 20 MG PO TABS
20.0000 mg | ORAL_TABLET | Freq: Every day | ORAL | Status: DC
Start: 2022-06-27 — End: 2022-06-26

## 2022-06-26 MED ORDER — ATORVASTATIN CALCIUM 20 MG PO TABS
20.0000 mg | ORAL_TABLET | ORAL | Status: DC
  Administered 2022-06-27: 20 mg via ORAL
  Filled 2022-06-26: qty 1

## 2022-06-26 NOTE — Progress Notes (Signed)
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  He had recently been treated with 14-day course of Keflex as an outpatient.  Presented to the ED on 06/20/2021 after being seen in clinic where the wound was noted to have become more red swollen and with increased drainage.    Patient mated to the hospital for IV antibiotics.  Podiatry consulted with plans to take patient to the OR for debridement.  Podiatry feels patient may ultimately require transmetatarsal amputation.  Assessment and Plan: * Osteomyelitis of foot, left, acute (HCC) Due to nonhealing diabetic foot ulcer.  Presented with worsening swelling and drainage despite 14-day course of Keflex.  MRI of the left foot shows severe diffuse cellulitis mostly over the medial aspect of the foot, multiple subcutaneous abscesses, diffuse osteomyelitis involving the first and second metatarsals, as well as severe midfoot degenerative changes felt to be neuropathic. Initially treated with empiric IV vancomycin and cefepime. 7/13: Underwent transmetatarsal amputation of left foot I&D of abscesses, culture sent 7/16: Operative culture growing MSSA 7/17: final pathology results still pending from bone tissue -- Podiatry following -- Transition antibiotic to IV cefazolin -- ID following, appreciate recommendations -- Monitor fever curve, CBC -- Follow-up podiatry's recommendations   Essential hypertension I do not see a formal diagnosis of hypertension, BPs have been mildly elevated here possibly due to pain. -- Start 5 mg amlodipine -- Hydralazine  as needed  Transaminitis Presented with AST 193, ALT 187 with otherwise normal hepatic function other than albumin 3.0.  Acute hepatitis panel negative.  Patient denies alcohol use.  Right upper quadrant ultrasound showed cholelithiasis without signs of acute cholecystitis or biliary ductal dilation.  Liver appeared normal on ultrasound.  Patient has no abdominal pain nausea or vomiting.  AST normalized ALT improved, 94 -- Monitor LFTs  GERD (gastroesophageal reflux disease) Continue PPI  Diabetic foot infection (HCC) See acute osteomyelitis  Type 2 diabetes mellitus with diabetic neuropathy, with long-term current use of insulin (HCC) Well-controlled with last A1c 6.2%.  Hold oral medications.  Cover with sliding scale NovoLog for now  Hyperlipidemia associated with type 2 diabetes mellitus (HCC) Lipitor initially held due to transaminitis. Repeat CMP tomorrow and resume if LFTs are further improved.        Subjective: Patient awake resting in bed when seen on rounds today.  He reports pain overall controlled.  He felt queasy earlier this morning but a little better now, reports having bowel movements this morning.  Says he should have help from his brother/family with dressing changes if he needs it, waiting to attempt dressing change himself with nursing later today.  Physical Exam: Vitals:   06/25/22 1957 06/26/22 0624 06/26/22 0813 06/26/22 1520  BP: (!) 168/91 128/69 (!) 144/85 (!) 159/86  Pulse: 71 85 74 80  Resp: 16 18 18 18   Temp: 97.9 F (36.6 C) 97.8 F (36.6 C) 97.8 F (36.6 C) 98.4 F (36.9 C)  TempSrc: Oral   Oral  SpO2: 96% 97% 97% 99%  Weight:      Height:       General exam: awake, alert, no acute  distressmembranes, hearing grossly normal  Respiratory system: On room air, normal respiratory effort. Cardiovascular system: Regular rhythm, no peripheral edema.   Central nervous system: A&O x3. no gross focal neurologic deficits, normal  speech Extremities: Left foot with clean dry intact dressing and Ace wrap Psychiatry: normal mood, congruent affect, judgement and insight appear normal   Data Reviewed: Notable labs: CMP with glucose 174, albumin 2.7, ALT 77 otherwise normal.  CBGs today 189, 225, 187   Family Communication: None present today, will attempt to call  Disposition: Status is: Inpatient Remains inpatient appropriate because: Awaiting final bone pathology and d/c antibiotic recommendation. Anticipate d/c tomorrow.   Planned Discharge Destination: Home    Time spent: 35 minutes  Author: Pennie Banter, DO 06/26/2022 6:41 PM  For on call review [email or web address].

## 2022-06-26 NOTE — TOC Progression Note (Signed)
Transition of Care [facility]) - Progression Note    Patient Details  Name: [name] MRN: [id number] Date of Birth: September 12, 1961  [facility]) CM/SW Contact  Beverly Sessions, RN Phone Number: 06/26/2022, 4:13 PM  Clinical Narrative:    Met with patient at beside.  Patient would like home health services to assist with dressing changes if able.  He will be educated on how to change his dressing, and said his brother would be available if needed.  Patient states he does not have a preference of home health agency.  Referral made and accepted by [facility].   Patient states that he has a RW at home and his brother will bring it at discharge.   Per MD awaiting final determination if patient will need IV antibiotics at discharge. Pam with [employer] update         Expected Discharge Plan and Services                                                 Social Determinants of Health (SDOH) Interventions    Readmission Risk Interventions     No data to display

## 2022-06-27 ENCOUNTER — Inpatient Hospital Stay: Payer: Self-pay

## 2022-06-27 DIAGNOSIS — E11628 Type 2 diabetes mellitus with other skin complications: Secondary | ICD-10-CM | POA: Diagnosis not present

## 2022-06-27 DIAGNOSIS — M86172 Other acute osteomyelitis, left ankle and foot: Secondary | ICD-10-CM | POA: Diagnosis not present

## 2022-06-27 DIAGNOSIS — L089 Local infection of the skin and subcutaneous tissue, unspecified: Secondary | ICD-10-CM | POA: Diagnosis not present

## 2022-06-27 DIAGNOSIS — E1169 Type 2 diabetes mellitus with other specified complication: Secondary | ICD-10-CM | POA: Diagnosis not present

## 2022-06-27 LAB — GLUCOSE, CAPILLARY
Glucose-Capillary: 176 mg/dL — ABNORMAL HIGH (ref 70–99)
Glucose-Capillary: 232 mg/dL — ABNORMAL HIGH (ref 70–99)

## 2022-06-27 MED ORDER — CHLORHEXIDINE GLUCONATE CLOTH 2 % EX PADS: 6.0000 | MEDICATED_PAD | Freq: Every day | CUTANEOUS | 0 refills | Status: AC

## 2022-06-27 MED ORDER — SODIUM CHLORIDE 0.9% FLUSH: 10.0000 mL | INTRAVENOUS | Status: DC | PRN

## 2022-06-27 MED ORDER — SENNOSIDES-DOCUSATE SODIUM 8.6-50 MG PO TABS: 1.0000 | ORAL_TABLET | Freq: Every evening | ORAL | 0 refills | Status: AC | PRN

## 2022-06-27 MED ORDER — OXYCODONE-ACETAMINOPHEN 5-325 MG PO TABS
1.0000 | ORAL_TABLET | Freq: Four times a day (QID) | ORAL | 0 refills | Status: AC | PRN
Start: 2022-06-27 — End: 2022-06-30

## 2022-06-27 MED ORDER — AMLODIPINE BESYLATE 5 MG PO TABS: 5.0000 mg | ORAL_TABLET | Freq: Every day | ORAL | 0 refills | Status: DC

## 2022-06-27 MED ORDER — CHLORHEXIDINE GLUCONATE CLOTH 2 % EX PADS
6.0000 | MEDICATED_PAD | Freq: Every day | CUTANEOUS | Status: DC
Start: 2022-06-27 — End: 2022-06-27

## 2022-06-27 MED ORDER — CEFAZOLIN IV (FOR PTA / DISCHARGE USE ONLY): 2.0000 g | Freq: Three times a day (TID) | INTRAVENOUS | 0 refills | Status: AC

## 2022-06-27 MED ORDER — SODIUM CHLORIDE 0.9% FLUSH: 10.0000 mL | Freq: Two times a day (BID) | INTRAVENOUS | Status: DC

## 2022-06-27 NOTE — TOC Transition Note (Addendum)
Transition of Care [facility]) - CM/SW Discharge Note   Patient Details  Name: [name] MRN: [id number] Date of Birth: 08/26/1961  [facility]) CM/SW Contact:  Chapman Fitch, RN Phone Number: 06/27/2022, 2:12 PM   Clinical Narrative:     Patient to discharge today PICC line placed Pam with [facility] at bedside for education  Kandee Keen with [facility] and Pam with [employer] aware of discharge.   Per patient brother will transport at discharge and will bring his RW  Bedside RN to change dressing before discharge      Per patient he will be discharging to his brothers home.  Updated Veatrice Bourbon with address    Patient Goals and CMS Choice        Discharge Placement                       Discharge Plan and Services                                     Social Determinants of Health (SDOH) Interventions     Readmission Risk Interventions     No data to display

## 2022-06-27 NOTE — Progress Notes (Signed)
PHARMACY CONSULT NOTE FOR:  OUTPATIENT  PARENTERAL ANTIBIOTIC THERAPY (OPAT)  Indication: MSSA osteomyelitis of left foot Regimen: Cefazolin 2gm IV q8h  End date: 07/19/2022  Labs - Once weekly:  CBC/D, CMP, ESR, CRP Please pull PIC at completion of IV antibiotics Fax weekly lab results  promptly to [phone]  IV antibiotic discharge orders are pended. To discharging provider:  please sign these orders via discharge navigator,  Select New Orders & click on the button choice - Manage This Unsigned Work.     Thank you for allowing pharmacy to be a part of this patient's care.  Doreene Eland, PharmD, BCPS, BCIDP Work Cell: [phone] 06/27/2022 9:10 AM

## 2022-06-27 NOTE — Progress Notes (Signed)
Pt discharged per MD order. IV removed. Discharge instructions reviewed with pt. Dressing changed performed by pt with nurse at the bedside. Dressing change supplies given for 4 days. Pt verbalized understanding. Pt taken out in wheelchair by staff.

## 2022-06-27 NOTE — Progress Notes (Signed)
Peripherally Inserted Central Catheter Placement  The IV Nurse has discussed with the patient and/or persons authorized to consent for the patient, the purpose of this procedure and the potential benefits and risks involved with this procedure.  The benefits include less needle sticks, lab draws from the catheter, and the patient may be discharged home with the catheter. Risks include, but not limited to, infection, bleeding, blood clot (thrombus formation), and puncture of an artery; nerve damage and irregular heartbeat and possibility to perform a PICC exchange if needed/ordered by physician.  Alternatives to this procedure were also discussed.  Bard Power PICC patient education guide, fact sheet on infection prevention and patient information card has been provided to patient /or left at bedside.    PICC Placement Documentation  PICC Single Lumen 06/27/22 Right Brachial 41 cm 0 cm (Active)  Indication for Insertion or Continuance of Line Home intravenous therapies (PICC only) 06/27/22 1238  Exposed Catheter (cm) 0 cm 06/27/22 1238  Site Assessment Clean, Dry, Intact 06/27/22 1238  Line Status Flushed;Saline locked;Blood return noted 06/27/22 1238  Dressing Type Transparent;Securing device 06/27/22 1238  Dressing Status Antimicrobial disc in place 06/27/22 1238  Dressing Intervention New dressing;Other (Comment) 06/27/22 1238  Dressing Change Due 07/04/22 06/27/22 1238       Reginia Forts [place]ce 06/27/2022, 12:52 PM

## 2022-06-27 NOTE — Discharge Summary (Signed)
[name] VVK:[id number] DOB: 17-Jan-1961 DOA: 06/20/2022  PCP: Jon Billings, NP  Admit date: 06/20/2022 Discharge date: 06/27/2022  Admitted From: home Disposition:  home  Recommendations for Outpatient Follow-up:  Follow up with PCP in 1 week Please obtain BMP/CBC in one week Please follow up with ID Clinic  Appt: 07/12/22 at 10.30am  Home Health:yes    Discharge Condition:Stable CODE STATUS:full  Diet recommendation: Heart Healthy  Brief/Interim Summary: Per HPI: 61 year old male with past medical history of insulin-dependent type 2 diabetes, history of osteomyelitis status post amputation of the left first and second toes in January 2022, hyperlipidemia, GERD, obesity who has a 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  He had recently been treated with 14-day course of Keflex as an outpatient.  Presented to the ED on 06/20/2021 after being seen in clinic where the wound was noted to have become more red swollen and with increased drainage.  Patient was admitted to the hospital for IV antibiotics.  Podiatry and ID were consulted.  Status post I&D and transmetatarsal amputation left foot.  He was found with Left foot osteomyelitis due to MSSA.  He had a PICC placement and was sent home with IV antibiotics per ID orders.  Osteomyelitis of foot, left, acute ([place]) Due to nonhealing diabetic foot ulcer.  Presented with worsening swelling and drainage despite 14-day course of Keflex.  MRI of the left foot shows severe diffuse cellulitis mostly over the medial aspect of the foot, multiple subcutaneous abscesses, diffuse osteomyelitis involving the first and second metatarsals, as well as severe midfoot degenerative changes felt to be neuropathic. Podiatry and ID consulted. Status post I&D and transmetatarsal amputation left foot.  S/p PICC today Will discharge with iv antibiotics Cefazolin 2 grams Iv every 8 hours Duration: 4 weeks End Date:07/19/22     PIC Care  Per Protocol:   Labs weekly while on IV antibiotics: _X_ CBC with differential   _X_ CMP _X_ CRP _X_ ESR     _X_ Please pull PIC at completion of IV antibiotics    Essential hypertension Continue amlodipine F/u with pcp for further mx   Transaminitis Presented with AST 193, ALT 187 with otherwise normal hepatic function other than albumin 3.0.  Acute hepatitis panel negative.  Patient denies alcohol use.  Right upper quadrant ultrasound showed cholelithiasis without signs of acute cholecystitis or biliary ductal dilation.  Liver appeared normal on ultrasound.  LFT trended down F/u with pcp for further monitoring      GERD (gastroesophageal reflux disease) Continue PPI   Diabetic foot infection ([place]) See acute osteomyelitis as above   Type 2 diabetes mellitus with diabetic neuropathy, with long-term current use of insulin ([place]) Well-controlled with last A1c 6.2%.   Continue home meds    Hyperlipidemia associated with type 2 diabetes mellitus ([place]) Lipitor initially held due to transaminitis, then resumed F/u with pcp      Discharge Diagnoses:  Principal Problem:   Osteomyelitis of foot, left, acute ([place]) Active Problems:   Hyperlipidemia associated with type 2 diabetes mellitus ([place])   Type 2 diabetes mellitus with diabetic neuropathy, with long-term current use of insulin ([place])   Diabetic foot infection ([place])   GERD (gastroesophageal reflux disease)   Transaminitis   Essential hypertension    Discharge Instructions  Discharge Instructions     [facility] pharmacist to adjust dose for Vancomycin, Aminoglycosides and other anti-infective therapies as requested by physician.   Complete by: As directed  [facility] to provide Cath Flo 733m   Complete by: As directed    Administer for PICC line occlusion and as ordered by physician for other access device issues.   Anaphylaxis Kit: Provided to treat any anaphylactic reaction to the  medication being provided to the patient if First Dose or when requested by physician   Complete by: As directed    Epinephrine 187mml vial / amp: Administer 0.33m44m0.33ml59mubcutaneously once for moderate to severe anaphylaxis, nurse to call physician and pharmacy when reaction occurs and call 911 if needed for immediate care   Diphenhydramine 50mg34mIV vial: Administer 25-50mg 733mM PRN for first dose reaction, rash, itching, mild reaction, nurse to call physician and pharmacy when reaction occurs   Sodium Chloride 0.9% NS 500ml I36mdminister if needed for hypovolemic blood pressure drop or as ordered by physician after call to physician with anaphylactic reaction   Call MD for:  severe uncontrolled pain   Complete by: As directed    Call MD for:  temperature >100.4   Complete by: As directed    Change dressing on IV access line weekly and PRN   Complete by: As directed    Diet - low sodium heart healthy   Complete by: As directed    Discharge wound care:   Complete by: As directed    As above   Flush IV access with Sodium Chloride 0.9% and Heparin 10 units/ml or 100 units/ml   Complete by: As directed    Home infusion instructions - [facility]   Complete by: As directed    Instructions: Flush IV access with Sodium Chloride 0.9% and Heparin 10units/ml or 100units/ml   Change dressing on IV access line: Weekly and PRN   Instructions Cath Flo 2mg: Ad34mister for PICC Line occlusion and as ordered by physician for other access device   [facility] pharmacist to adjust dose for: Vancomycin, Aminoglycosides and other anti-infective therapies as requested by physician   Increase activity slowly   Complete by: As directed    Method of administration may be changed at the discretion of home infusion pharmacist based upon assessment of the patient and/or caregiver's ability to self-administer the medication ordered   Complete by: As directed       Allergies as of  06/27/2022   No Known Allergies      Medication List     STOP taking these medications    ferrous sulfate 325 (65 FE) MG tablet       TAKE these medications    Accu-Chek Guide test strip Generic drug: glucose blood 1 each by Other route daily.   amLODipine 5 MG tablet Commonly known as: NORVASC Take 1 tablet (5 mg total) by mouth daily. Start taking on: June 28, 2022   aspirin EC 325 MG tablet Take 325 mg by mouth daily.   atorvastatin 20 MG tablet Commonly known as: LIPITOR Take 1 tablet (20 mg total) by mouth every other day.   B COMPLEX 1 PO Take 1 tablet by mouth daily.   BD Pen Needle Micro U/F 32G X 6 MM Misc Generic drug: Insulin Pen Needle USE 1  ONCE DAILY AS NEEDED   ceFAZolin  IVPB Commonly known as: ANCEF Inject 2 g into the vein every 8 (eight) hours for 22 days. Indication:  MSSA osteomyelitis of left foot First Dose: Yes Last Day of Therapy:  07/19/2022 Labs - Once weekly:  CBC/D, CMP, ESR, CRP Please pull PIC at  completion of IV antibiotics Fax weekly lab results  promptly to [phone] Method of administration: IV Push Method of administration may be changed at the discretion of home infusion pharmacist based upon assessment of the patient and/or caregiver's ability to self-administer the medication ordered.   Chlorhexidine Gluconate Cloth 2 % Pads Apply 6 each topically daily.   Cinnamon 500 MG capsule Take 500 mg by mouth daily.   Garlic 2563 MG Caps Take 1,000 mg by mouth daily.   glipiZIDE 5 MG tablet Commonly known as: GLUCOTROL Take 1 tablet (5 mg total) by mouth 2 (two) times daily before a meal.   metFORMIN 1000 MG tablet Commonly known as: GLUCOPHAGE Take 1 tablet (1,000 mg total) by mouth 2 (two) times daily with a meal.   mupirocin ointment 2 % Commonly known as: BACTROBAN Apply 1 Application topically 2 (two) times daily.   oxyCODONE-acetaminophen 5-325 MG tablet Commonly known as: PERCOCET/ROXICET Take 1 tablet  by mouth every 6 (six) hours as needed for up to 3 days for severe pain.   senna-docusate 8.6-50 MG tablet Commonly known as: Senokot-S Take 1 tablet by mouth at bedtime as needed for up to 7 days for mild constipation.   VITAMIN C CR 1500 MG Tbcr Take 1,500 mg by mouth daily.               Discharge Care Instructions  (From admission, onward)           Start     Ordered   06/27/22 0000  Change dressing on IV access line weekly and PRN  (Home infusion instructions - [facility] )        06/27/22 1225   06/27/22 0000  Discharge wound care:       Comments: As above   06/27/22 1219            Follow-up Information     Sharlotte Alamo, DPM. Go on 07/05/2022.   Specialty: Podiatry Why: Go at 3:15pm. Contact information: [place] [id number] [phone]         Jon Billings, NP. Go on 07/06/2022.   Specialty: Nurse Practitioner Why: Go at 2:20pm. You will see Dr Park Liter. Contact information: [place] [address] [phone]                No Known Allergies  Consultations: Podiatry , ID   Procedures/Studies: [place] EKG SITE RITE  Result Date: 06/27/2022 If Site Rite image not attached, placement could not be confirmed due to current cardiac rhythm.  [place] EKG SITE RITE  Result Date: 06/27/2022 If Site Rite image not attached, placement could not be confirmed due to current cardiac rhythm.  DG MINI C-ARM IMAGE ONLY  Result Date: 06/21/2022 There is no interpretation for this exam.  This order is for images obtained during a surgical procedure.  Please See "Surgeries" Tab for more information regarding the procedure.   MR FOOT LEFT W WO CONTRAST  Result Date: 06/21/2022 CLINICAL DATA:  Foot pain and swelling. History of prior amputations. EXAM: MRI OF THE LEFT FOREFOOT WITHOUT AND WITH CONTRAST TECHNIQUE: Multiplanar, multisequence MR imaging of the left foot was performed both before and  after administration of intravenous contrast. CONTRAST:  56m GADAVIST GADOBUTROL 1 MMOL/ML IV SOLN COMPARISON:  Radiographs 06/20/2022 FINDINGS: Severe/diffuse soft tissue swelling/edema consistent with severe cellulitis most notably over the medial aspect of the foot. There are multiple abscesses. The largest is a curvilinear abscess wrapping  around the partially amputated first metatarsal this measures a maximum of 4 cm. Second largest abscess is in the dorsal aspect of the midfoot and this measures 4.0 x 3.1 x 1.5 cm. Several other smaller abscesses noted. Diffuse osteomyelitis involving the first and second metatarsals. Chronic appearing deformity of the third MTP joint but I do not see any definite findings to suggest septic arthritis or osteomyelitis. 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 IMPRESSION: 1. Severe/diffuse cellulitis most notably over the medial aspect of the foot. 2. Multiple subcutaneous abscesses. 3. Diffuse osteomyelitis involving the first and second metatarsals. 4. 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 Electronically Signed   By: Marijo Sanes M.D.   On: 06/21/2022 07:31   US ABDOMEN LIMITED RUQ (LIVER/GB)  Result Date: 06/20/2022 CLINICAL DATA:  Transaminitis. EXAM: ULTRASOUND ABDOMEN LIMITED RIGHT UPPER QUADRANT COMPARISON:  None Available. FINDINGS: Gallbladder: Numerous small less than 1 cm gallstones are seen. No evidence of gallbladder dilatation or wall thickening. No sonographic Murphy sign noted by sonographer. Common bile duct: Diameter: 5 mm, within normal limits. Liver: No focal lesion identified. Within normal limits in parenchymal echogenicity. Portal vein is patent on color Doppler imaging with normal direction of blood flow towards the liver. Other: None. IMPRESSION: Cholelithiasis. No sonographic signs of cholecystitis or biliary ductal dilatation. Unremarkable sonographic appearance of liver. Electronically Signed   By: Marlaine Hind  M.D.   On: 06/20/2022 18:12   DG Foot Complete Left  Result Date: 06/20/2022 CLINICAL DATA:  Infection and concern for osteomyelitis.  Diabetes. EXAM: LEFT FOOT - COMPLETE 3+ VIEW COMPARISON:  Left foot radiograph dated 01/07/2021. FINDINGS: Status post prior amputation of the midportion of the first metatarsal and second phalanges. Several small bone densities adjacent to the first metatarsal amputation may be chronic. Osteomyelitis is not excluded. There is irregularity of the second metatarsal likely related to chronic infection or less likely old healed fracture. There is sclerotic changes and irregularity of the third metatarsophalangeal joint consistent with chronic osteomyelitis. There is no acute fracture or dislocation. The bones are osteopenic. There is soft tissue thickening of the forefoot primarily over the first and second ray. No soft tissue gas. IMPRESSION: 1. No acute fracture or dislocation. 2. 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 3. Soft tissue thickening of the forefoot and chronic changes of the second metatarsal and third MTP joint. MRI or a white blood cell nuclear scan may provide better evaluation if there is high clinical concern for acute osteomyelitis. Electronically Signed   By: Anner Crete M.D.   On: 06/20/2022 14:45      Subjective: No complaints this am  Discharge Exam: Vitals:   06/27/22 0839 06/27/22 1601  BP: (!) 146/84 (!) 164/81  Pulse: 78 84  Resp: 18 18  Temp: 98.1 F (36.7 C) 97.9 F (36.6 C)  SpO2: 97% 96%   Vitals:   06/27/22 0414 06/27/22 0500 06/27/22 0839 06/27/22 1601  BP: (!) 120/55  (!) 146/84 (!) 164/81  Pulse: 65  78 84  Resp: '18  18 18  ' Temp: 98.1 F (36.7 C)  98.1 F (36.7 C) 97.9 F (36.6 C)  TempSrc:      SpO2: 94%  97% 96%  Weight:  102.8 kg    Height:        General: Pt is alert, awake, not in acute distress Cardiovascular: RRR, S1/S2 +, no rubs, no  gallops Respiratory: CTA bilaterally, no wheezing, no rhonchi Abdominal: Soft, NT,  ND, bowel sounds + Extremities: LLE dressing in place, no cyanosis    The results of significant diagnostics from this hospitalization (including imaging, microbiology, ancillary and laboratory) are listed below for reference.     Microbiology: Recent Results (from the past 240 hour(s))  Aerobic Culture w Gram Stain (superficial specimen)     Status: None   Collection Time: 06/20/22  1:33 PM   Specimen: Wound  Result Value Ref Range Status   Specimen Description   Final    WOUND Performed at [facility], [address], [place], [place] [address]    Special Requests   Final    LEFT FOOT Performed at [facility], [address] [place], [place] [address]    Gram Stain   Final    NO WBC SEEN RARE GRAM POSITIVE COCCI Performed at [facility], [place] [address] [place], [place] [address]    Culture FEW STAPHYLOCOCCUS AUREUS  Final   Report Status 06/22/2022 FINAL  Final   Organism ID, Bacteria STAPHYLOCOCCUS AUREUS  Final      Susceptibility   Staphylococcus aureus - MIC*    CIPROFLOXACIN <=0.5 SENSITIVE Sensitive     ERYTHROMYCIN <=0.25 SENSITIVE Sensitive     GENTAMICIN <=0.5 SENSITIVE Sensitive     OXACILLIN 0.5 SENSITIVE Sensitive     TETRACYCLINE <=1 SENSITIVE Sensitive     VANCOMYCIN 1 SENSITIVE Sensitive     TRIMETH/SULFA <=10 SENSITIVE Sensitive     CLINDAMYCIN <=0.25 SENSITIVE Sensitive     RIFAMPIN <=0.5 SENSITIVE Sensitive     Inducible Clindamycin NEGATIVE Sensitive     * FEW STAPHYLOCOCCUS AUREUS  Culture, blood (Routine x 2)     Status: None   Collection Time: 06/20/22  2:26 PM   Specimen: BLOOD  Result Value Ref Range Status   Specimen Description BLOOD BLOOD LEFT FOREARM  Final   Special Requests   Final    BOTTLES DRAWN AEROBIC AND ANAEROBIC Blood Culture adequate volume   Culture   Final    NO GROWTH 5 DAYS Performed at [facility], [address] [place], [place] [address]    Report Status 06/25/2022 FINAL  Final  Culture, blood (Routine x 2)     Status: None   Collection Time: 06/20/22  4:58 PM   Specimen: BLOOD  Result Value Ref Range Status   Specimen Description BLOOD BLOOD LEFT ARM  Final   Special Requests   Final    BOTTLES DRAWN AEROBIC AND ANAEROBIC Blood Culture adequate volume   Culture   Final    NO GROWTH 5 DAYS Performed at [facility], [address], [place], [place] [address]    Report Status 06/25/2022 FINAL  Final  MRSA Next Gen by PCR, Nasal     Status: None   Collection Time: 06/20/22  4:58 PM   Specimen: Nasal Mucosa; Nasal Swab  Result Value Ref Range Status   MRSA by PCR Next Gen NOT DETECTED NOT DETECTED Final    Comment: (NOTE) The GeneXpert MRSA Assay (FDA approved for NASAL specimens only), is one component of a comprehensive MRSA colonization surveillance program. It is not intended to diagnose MRSA infection nor to guide or monitor treatment for MRSA infections. Test performance is not FDA approved in patients less than 62 years old. Performed at [facility], [address] [place], [place] [address]   Aerobic/Anaerobic Culture w Gram Stain (surgical/deep wound)     Status: None   Collection Time:  06/21/22  6:54 PM   Specimen: PATH Other; Tissue  Result Value Ref Range Status   Specimen Description   Final    BONE Performed at [facility], [address] [place], [place] [address]    Special Requests LEFT 2ND METATARSAL  Final   Gram Stain   Final    NO SQUAMOUS EPITHELIAL CELLS SEEN NO WBC SEEN NO ORGANISMS SEEN    Culture   Final    RARE STAPHYLOCOCCUS AUREUS SUSCEPTIBILITIES PERFORMED ON PREVIOUS CULTURE WITHIN THE LAST 5 DAYS. NO ANAEROBES ISOLATED Performed at [facility], [place] [address] [place], [place] [address]    Report Status 06/26/2022 FINAL  Final  Aerobic/Anaerobic Culture w Gram Stain (surgical/deep  wound)     Status: None   Collection Time: 06/21/22  6:55 PM   Specimen: PATH Other; Tissue  Result Value Ref Range Status   Specimen Description WOUND LEFT FOOT  Final   Special Requests DEEP  Final   Gram Stain   Final    NO SQUAMOUS EPITHELIAL CELLS SEEN NO WBC SEEN NO ORGANISMS SEEN    Culture   Final    FEW STAPHYLOCOCCUS AUREUS SUSCEPTIBILITIES PERFORMED ON PREVIOUS CULTURE WITHIN THE LAST 5 DAYS. NO ANAEROBES ISOLATED Performed at [facility], [place] [address], [place], [place] [address]    Report Status 06/26/2022 FINAL  Final  Aerobic/Anaerobic Culture w Gram Stain (surgical/deep wound)     Status: None   Collection Time: 06/21/22  6:55 PM   Specimen: PATH Other; Wound  Result Value Ref Range Status   Specimen Description   Final    BONE Performed at [facility], [address] [place], [place] [address]    Special Requests 1ST METATARSAL LEFT  Final   Gram Stain   Final    NO SQUAMOUS EPITHELIAL CELLS SEEN RARE WBC SEEN NO ORGANISMS SEEN    Culture   Final    RARE STAPHYLOCOCCUS AUREUS NO ANAEROBES ISOLATED CRITICAL RESULT CALLED TO, READ BACK BY AND VERIFIED WITH: DPM CLINE [id number] FCP Performed at [facility], [place] [address], [place], [place] [address]    Report Status 06/26/2022 FINAL  Final   Organism ID, Bacteria STAPHYLOCOCCUS AUREUS  Final      Susceptibility   Staphylococcus aureus - MIC*    CIPROFLOXACIN <=0.5 SENSITIVE Sensitive     ERYTHROMYCIN <=0.25 SENSITIVE Sensitive     GENTAMICIN <=0.5 SENSITIVE Sensitive     OXACILLIN <=0.25 SENSITIVE Sensitive     TETRACYCLINE <=1 SENSITIVE Sensitive     VANCOMYCIN <=0.5 SENSITIVE Sensitive     TRIMETH/SULFA <=10 SENSITIVE Sensitive     CLINDAMYCIN <=0.25 SENSITIVE Sensitive     RIFAMPIN <=0.5 SENSITIVE Sensitive     Inducible Clindamycin NEGATIVE Sensitive     * RARE STAPHYLOCOCCUS AUREUS     Labs: BNP (last 3 results) No results for input(s): "BNP" in the last 8760  hours. Basic Metabolic Panel: Recent Labs  Lab 06/21/22 0526 06/22/22 0602 06/23/22 0526 06/24/22 0406 06/26/22 0553  NA 136 137 137 138 137  K 4.0 4.4 4.1 4.0 4.1  CL 104 109 108 106 102  CO2 '25 25 25 25 29  ' GLUCOSE 111* 187* 149* 151* 174*  BUN 21* '13 12 11 15  ' CREATININE 0.86 0.78 0.82 0.69 0.80  CALCIUM 8.2* 8.0* 8.1* 8.6* 9.0  MG  --  1.7 1.9  --   --    Liver Function Tests: Recent Labs  Lab 06/21/22 0526 06/22/22 0602 06/23/22 0526 06/26/22 0553  AST 151* 72* 28 41  ALT 189* 142* 94* 77*  ALKPHOS 72 66 63 71  BILITOT 0.7 0.5 0.4 0.5  PROT 6.9 6.9 6.9 8.0  ALBUMIN 2.6* 2.5* 2.5* 2.7*   No results for input(s): "LIPASE", "AMYLASE" in the last 168 hours. No results for input(s): "AMMONIA" in the last 168 hours. CBC: Recent Labs  Lab 06/21/22 0526 06/22/22 0602 06/23/22 0526 06/24/22 0406 06/25/22 0520  WBC 9.4 11.7* 10.7* 11.3* 9.3  HGB 10.2* 10.1* 10.1* 11.1* 11.0*  HCT 30.7* 31.2* 30.7* 32.5* 33.0*  MCV 90.8 92.0 91.6 91.3 89.9  PLT 302 279 274 282 267   Cardiac Enzymes: No results for input(s): "CKTOTAL", "CKMB", "CKMBINDEX", "TROPONINI" in the last 168 hours. BNP: Invalid input(s): "POCBNP" CBG: Recent Labs  Lab 06/26/22 1133 06/26/22 1656 06/26/22 2129 06/27/22 0829 06/27/22 1257  GLUCAP 225* 187* 212* 176* 232*   D-Dimer No results for input(s): "DDIMER" in the last 72 hours. Hgb A1c No results for input(s): "HGBA1C" in the last 72 hours. Lipid Profile No results for input(s): "CHOL", "HDL", "LDLCALC", "TRIG", "CHOLHDL", "LDLDIRECT" in the last 72 hours. Thyroid function studies No results for input(s): "TSH", "T4TOTAL", "T3FREE", "THYROIDAB" in the last 72 hours.  Invalid input(s): "FREET3" Anemia work up No results for input(s): "VITAMINB12", "FOLATE", "FERRITIN", "TIBC", "IRON", "RETICCTPCT" in the last 72 hours. Urinalysis    Component Value Date/Time   COLORURINE YELLOW (A) 01/06/2021 2230   APPEARANCEUR Clear 11/24/2021  1337   LABSPEC 1.021 01/06/2021 2230   PHURINE 5.0 01/06/2021 2230   GLUCOSEU Negative 11/24/2021 1337   HGBUR SMALL (A) 01/06/2021 2230   BILIRUBINUR Negative 11/24/2021 1337   KETONESUR 20 (A) 01/06/2021 2230   PROTEINUR 3+ (A) 11/24/2021 1337   PROTEINUR 100 (A) 01/06/2021 2230   NITRITE Negative 11/24/2021 1337   NITRITE NEGATIVE 01/06/2021 2230   LEUKOCYTESUR Negative 11/24/2021 1337   LEUKOCYTESUR NEGATIVE 01/06/2021 2230   Sepsis Labs Recent Labs  Lab 06/22/22 0602 06/23/22 0526 06/24/22 0406 06/25/22 0520  WBC 11.7* 10.7* 11.3* 9.3   Microbiology Recent Results (from the past 240 hour(s))  Aerobic Culture w Gram Stain (superficial specimen)     Status: None   Collection Time: 06/20/22  1:33 PM   Specimen: Wound  Result Value Ref Range Status   Specimen Description   Final    WOUND Performed at [facility], [address], [place], [place] [address]    Special Requests   Final    LEFT FOOT Performed at [facility], [address] [place], [place] [address]    Gram Stain   Final    NO WBC SEEN RARE GRAM POSITIVE COCCI Performed at [facility], [place] [address], [place], [place] [address]    Culture FEW STAPHYLOCOCCUS AUREUS  Final   Report Status 06/22/2022 FINAL  Final   Organism ID, Bacteria STAPHYLOCOCCUS AUREUS  Final      Susceptibility   Staphylococcus aureus - MIC*    CIPROFLOXACIN <=0.5 SENSITIVE Sensitive     ERYTHROMYCIN <=0.25 SENSITIVE Sensitive     GENTAMICIN <=0.5 SENSITIVE Sensitive     OXACILLIN 0.5 SENSITIVE Sensitive     TETRACYCLINE <=1 SENSITIVE Sensitive     VANCOMYCIN 1 SENSITIVE Sensitive     TRIMETH/SULFA <=10 SENSITIVE Sensitive     CLINDAMYCIN <=0.25 SENSITIVE Sensitive     RIFAMPIN <=0.5 SENSITIVE Sensitive     Inducible Clindamycin NEGATIVE Sensitive     *  FEW STAPHYLOCOCCUS AUREUS  Culture, blood (Routine x 2)     Status: None   Collection Time: 06/20/22  2:26 PM   Specimen: BLOOD  Result Value  Ref Range Status   Specimen Description BLOOD BLOOD LEFT FOREARM  Final   Special Requests   Final    BOTTLES DRAWN AEROBIC AND ANAEROBIC Blood Culture adequate volume   Culture   Final    NO GROWTH 5 DAYS Performed at [facility], [address], [place], [place] [address]    Report Status 06/25/2022 FINAL  Final  Culture, blood (Routine x 2)     Status: None   Collection Time: 06/20/22  4:58 PM   Specimen: BLOOD  Result Value Ref Range Status   Specimen Description BLOOD BLOOD LEFT ARM  Final   Special Requests   Final    BOTTLES DRAWN AEROBIC AND ANAEROBIC Blood Culture adequate volume   Culture   Final    NO GROWTH 5 DAYS Performed at [facility], [address], [place], [place] [address]    Report Status 06/25/2022 FINAL  Final  MRSA Next Gen by PCR, Nasal     Status: None   Collection Time: 06/20/22  4:58 PM   Specimen: Nasal Mucosa; Nasal Swab  Result Value Ref Range Status   MRSA by PCR Next Gen NOT DETECTED NOT DETECTED Final    Comment: (NOTE) The GeneXpert MRSA Assay (FDA approved for NASAL specimens only), is one component of a comprehensive MRSA colonization surveillance program. It is not intended to diagnose MRSA infection nor to guide or monitor treatment for MRSA infections. Test performance is not FDA approved in patients less than 36 years old. Performed at [facility], [address] [place], [place] [address]   Aerobic/Anaerobic Culture w Gram Stain (surgical/deep wound)     Status: None   Collection Time: 06/21/22  6:54 PM   Specimen: PATH Other; Tissue  Result Value Ref Range Status   Specimen Description   Final    BONE Performed at [facility], [address] [place], [place] [address]    Special Requests LEFT 2ND METATARSAL  Final   Gram Stain   Final    NO SQUAMOUS EPITHELIAL CELLS SEEN NO WBC SEEN NO ORGANISMS SEEN    Culture   Final    RARE STAPHYLOCOCCUS AUREUS SUSCEPTIBILITIES PERFORMED ON  PREVIOUS CULTURE WITHIN THE LAST 5 DAYS. NO ANAEROBES ISOLATED Performed at [facility], [place] [address] [place], [place] [address]    Report Status 06/26/2022 FINAL  Final  Aerobic/Anaerobic Culture w Gram Stain (surgical/deep wound)     Status: None   Collection Time: 06/21/22  6:55 PM   Specimen: PATH Other; Tissue  Result Value Ref Range Status   Specimen Description WOUND LEFT FOOT  Final   Special Requests DEEP  Final   Gram Stain   Final    NO SQUAMOUS EPITHELIAL CELLS SEEN NO WBC SEEN NO ORGANISMS SEEN    Culture   Final    FEW STAPHYLOCOCCUS AUREUS SUSCEPTIBILITIES PERFORMED ON PREVIOUS CULTURE WITHIN THE LAST 5 DAYS. NO ANAEROBES ISOLATED Performed at [facility], [place] [address] [place], [place] [address]    Report Status 06/26/2022 FINAL  Final  Aerobic/Anaerobic Culture w Gram Stain (surgical/deep wound)     Status: None   Collection Time: 06/21/22  6:55 PM   Specimen: PATH Other; Wound  Result Value Ref Range Status   Specimen Description  Final    BONE Performed at [facility], [address] [place], [place] [address]    Special Requests 1ST METATARSAL LEFT  Final   Gram Stain   Final    NO SQUAMOUS EPITHELIAL CELLS SEEN RARE WBC SEEN NO ORGANISMS SEEN    Culture   Final    RARE STAPHYLOCOCCUS AUREUS NO ANAEROBES ISOLATED CRITICAL RESULT CALLED TO, READ BACK BY AND VERIFIED WITH: DPM CLINE [id number] FCP Performed at [facility], [place] [address] [place], [place] [address]    Report Status 06/26/2022 FINAL  Final   Organism ID, Bacteria STAPHYLOCOCCUS AUREUS  Final      Susceptibility   Staphylococcus aureus - MIC*    CIPROFLOXACIN <=0.5 SENSITIVE Sensitive     ERYTHROMYCIN <=0.25 SENSITIVE Sensitive     GENTAMICIN <=0.5 SENSITIVE Sensitive     OXACILLIN <=0.25 SENSITIVE Sensitive     TETRACYCLINE <=1 SENSITIVE Sensitive     VANCOMYCIN <=0.5 SENSITIVE Sensitive     TRIMETH/SULFA <=10 SENSITIVE Sensitive      CLINDAMYCIN <=0.25 SENSITIVE Sensitive     RIFAMPIN <=0.5 SENSITIVE Sensitive     Inducible Clindamycin NEGATIVE Sensitive     * RARE STAPHYLOCOCCUS AUREUS     Time coordinating discharge: Over 30 minutes  SIGNED:   Nolberto Hanlon, MD  [facility] 06/27/2022, 4:58 PM Pager   If 7PM-7AM, please contact night-coverage [email or web address] Password [email or web address]

## 2022-06-27 NOTE — Progress Notes (Signed)
  Date of Admission:  06/20/2022    ID: [name] is a 61 y.o. male  Principal Problem:   Osteomyelitis of foot, left, acute ([place]) Active Problems:   Hyperlipidemia associated with type 2 diabetes mellitus ([place])   Type 2 diabetes mellitus with diabetic neuropathy, with long-term current use of insulin ([facility])   Diabetic foot infection ([place])   GERD (gastroesophageal reflux disease)   Transaminitis   Essential hypertension    Subjective: Says he is feeling okay He understands he needs to get IV antibiotic at home  Medications:   amLODipine  5 mg Oral Daily   atorvastatin  20 mg Oral QODAY   enoxaparin (LOVENOX) injection  0.5 mg/kg Subcutaneous Q24H   insulin aspart  0-6 Units Subcutaneous TID WC   polyethylene glycol  17 g Oral Daily    Objective: Vital signs in last 24 hours: Temp:  [98.1 F (36.7 C)-98.4 F (36.9 C)] 98.1 F (36.7 C) (07/19 0839) Pulse Rate:  [65-80] 78 (07/19 0839) Resp:  [18-20] 18 (07/19 0839) BP: (120-164)/(55-86) 146/84 (07/19 0839) SpO2:  [94 %-99 %] 97 % (07/19 0839) Weight:  [102.8 kg] 102.8 kg (07/19 0500)   PHYSICAL EXAM:  General: Alert, cooperative, no distress, appears stated age.  Lungs: Clear to auscultation bilaterally. No Wheezing or Rhonchi. No rales. Heart: Regular rate and rhythm, no murmur, rub or gallop. Abdomen: Soft, non-tender,not distended. Bowel sounds normal. No masses Extremities: Left TMA site-   Not seen today well approximated- dorsal surgical site is packed The TMA margin has some discoloration     Skin: No rashes or lesions. Or bruising Lymph: Cervical, supraclavicular normal. Neurologic: Grossly non-focal  Lab Results Recent Labs    06/25/22 0520 06/26/22 0553  WBC 9.3  --   HGB 11.0*  --   HCT 33.0*  --   NA  --  137  K  --  4.1  CL  --  102  CO2  --  29  BUN  --  15  CREATININE  --  0.80   Liver Panel Recent Labs    06/26/22 0553  PROT 8.0  ALBUMIN 2.7*  AST 41  ALT 77*  ALKPHOS 62   BILITOT 0.5    Microbiology: Wound culture - MSSA    Assessment/Plan: Diabetic foot infection with left foot abscess and osteo of the first and 2nd met head-  S/p TMA- culture MSSA Bone pathology proximal margin of the disarticulated met head is positive for osteo ON cefazolin For 4 weeks -  DM- management as per primary team  Discussed the management with him Will follow him as OP

## 2022-06-27 NOTE — Treatment Plan (Signed)
Diagnosis:Left foot osteomyelitis due to MSSA s/p TMA Baseline Creatinine <1  No Known Allergies  OPAT Orders Discharge antibiotics: Cefazolin 2 grams Iv every 8 hours Duration: 4 weeks End Date:07/19/22   [facility] Care Per Protocol:  Labs weekly while on IV antibiotics: _X_ CBC with differential  _X_ CMP _X_ CRP _X_ ESR   _X_ Please pull PIC at completion of IV antibiotics _  Fax weekly lab results  promptly to [phone]  Clinic Follow Up Appt: 07/12/22 at 10.30am   Call [phone] with any questions

## 2022-06-28 DIAGNOSIS — E1169 Type 2 diabetes mellitus with other specified complication: Secondary | ICD-10-CM | POA: Diagnosis not present

## 2022-06-28 DIAGNOSIS — E7800 Pure hypercholesterolemia, unspecified: Secondary | ICD-10-CM | POA: Diagnosis not present

## 2022-06-28 DIAGNOSIS — K219 Gastro-esophageal reflux disease without esophagitis: Secondary | ICD-10-CM | POA: Diagnosis not present

## 2022-06-28 DIAGNOSIS — Z4781 Encounter for orthopedic aftercare following surgical amputation: Secondary | ICD-10-CM | POA: Diagnosis not present

## 2022-06-28 DIAGNOSIS — Z452 Encounter for adjustment and management of vascular access device: Secondary | ICD-10-CM | POA: Diagnosis not present

## 2022-06-28 DIAGNOSIS — Z9181 History of falling: Secondary | ICD-10-CM | POA: Diagnosis not present

## 2022-06-28 DIAGNOSIS — M86172 Other acute osteomyelitis, left ankle and foot: Secondary | ICD-10-CM | POA: Diagnosis not present

## 2022-06-28 DIAGNOSIS — Z8616 Personal history of COVID-19: Secondary | ICD-10-CM | POA: Diagnosis not present

## 2022-06-28 DIAGNOSIS — Z794 Long term (current) use of insulin: Secondary | ICD-10-CM | POA: Diagnosis not present

## 2022-06-28 DIAGNOSIS — Z792 Long term (current) use of antibiotics: Secondary | ICD-10-CM | POA: Diagnosis not present

## 2022-06-28 DIAGNOSIS — E1140 Type 2 diabetes mellitus with diabetic neuropathy, unspecified: Secondary | ICD-10-CM | POA: Diagnosis not present

## 2022-06-28 DIAGNOSIS — Z89422 Acquired absence of other left toe(s): Secondary | ICD-10-CM | POA: Diagnosis not present

## 2022-06-29 DIAGNOSIS — M86172 Other acute osteomyelitis, left ankle and foot: Secondary | ICD-10-CM | POA: Diagnosis not present

## 2022-06-30 DIAGNOSIS — M86172 Other acute osteomyelitis, left ankle and foot: Secondary | ICD-10-CM | POA: Diagnosis not present

## 2022-07-01 DIAGNOSIS — M86172 Other acute osteomyelitis, left ankle and foot: Secondary | ICD-10-CM | POA: Diagnosis not present

## 2022-07-02 ENCOUNTER — Emergency Department
Admission: EM | Admit: 2022-07-02 | Discharge: 2022-07-02 | Disposition: A | Discharge status: Home / Self Care | Payer: BC Managed Care – PPO | Attending: Emergency Medicine | Admitting: Emergency Medicine

## 2022-07-02 ENCOUNTER — Other Ambulatory Visit: Payer: Self-pay

## 2022-07-02 ENCOUNTER — Other Ambulatory Visit: Payer: BC Managed Care – PPO

## 2022-07-02 ENCOUNTER — Ambulatory Visit: Payer: BC Managed Care – PPO | Admitting: Nurse Practitioner

## 2022-07-02 DIAGNOSIS — Z95828 Presence of other vascular implants and grafts: Secondary | ICD-10-CM

## 2022-07-02 DIAGNOSIS — Z89422 Acquired absence of other left toe(s): Secondary | ICD-10-CM | POA: Diagnosis not present

## 2022-07-02 DIAGNOSIS — Y712 Prosthetic and other implants, materials and accessory cardiovascular devices associated with adverse incidents: Secondary | ICD-10-CM | POA: Insufficient documentation

## 2022-07-02 DIAGNOSIS — Z8616 Personal history of COVID-19: Secondary | ICD-10-CM | POA: Diagnosis not present

## 2022-07-02 DIAGNOSIS — Z4781 Encounter for orthopedic aftercare following surgical amputation: Secondary | ICD-10-CM | POA: Diagnosis not present

## 2022-07-02 DIAGNOSIS — E7800 Pure hypercholesterolemia, unspecified: Secondary | ICD-10-CM | POA: Diagnosis not present

## 2022-07-02 DIAGNOSIS — T82898A Other specified complication of vascular prosthetic devices, implants and grafts, initial encounter: Secondary | ICD-10-CM | POA: Diagnosis not present

## 2022-07-02 DIAGNOSIS — Z9181 History of falling: Secondary | ICD-10-CM | POA: Diagnosis not present

## 2022-07-02 DIAGNOSIS — Z792 Long term (current) use of antibiotics: Secondary | ICD-10-CM | POA: Diagnosis not present

## 2022-07-02 DIAGNOSIS — T82598A Other mechanical complication of other cardiac and vascular devices and implants, initial encounter: Secondary | ICD-10-CM | POA: Insufficient documentation

## 2022-07-02 DIAGNOSIS — K219 Gastro-esophageal reflux disease without esophagitis: Secondary | ICD-10-CM | POA: Diagnosis not present

## 2022-07-02 DIAGNOSIS — Z794 Long term (current) use of insulin: Secondary | ICD-10-CM | POA: Diagnosis not present

## 2022-07-02 DIAGNOSIS — M86172 Other acute osteomyelitis, left ankle and foot: Secondary | ICD-10-CM | POA: Diagnosis not present

## 2022-07-02 DIAGNOSIS — E1140 Type 2 diabetes mellitus with diabetic neuropathy, unspecified: Secondary | ICD-10-CM | POA: Diagnosis not present

## 2022-07-02 DIAGNOSIS — Z452 Encounter for adjustment and management of vascular access device: Secondary | ICD-10-CM | POA: Diagnosis not present

## 2022-07-02 DIAGNOSIS — E1169 Type 2 diabetes mellitus with other specified complication: Secondary | ICD-10-CM | POA: Diagnosis not present

## 2022-07-02 MED ORDER — HEPARIN SOD (PORK) LOCK FLUSH 10 UNIT/ML IV SOLN
10.0000 [IU] | Freq: Once | INTRAVENOUS | Status: AC
  Administered 2022-07-02: 10 [IU]
  Filled 2022-07-02: qty 1

## 2022-07-02 NOTE — ED Notes (Signed)
Pt verbalized understanding of discharge instructions. Opportunity for questions provided.  

## 2022-07-02 NOTE — ED Provider Notes (Signed)
[facility] Provider Note    Event Date/Time   First MD Initiated Contact with Patient 07/02/22 1652     (approximate)   History   Vascular Access Problem   HPI  [name] is a 61 y.o. male presents emergency department stating he has a problem with his PICC line.  States a home health nurse was unable to flush his PICC line this morning.  No fever or chills.  States he needs his 3 PM antibiotic.      Physical Exam   Triage Vital Signs: ED Triage Vitals  Enc Vitals Group     BP 07/02/22 1541 119/75     Pulse Rate 07/02/22 1541 95     Resp 07/02/22 1541 16     Temp 07/02/22 1541 98.8 F (37.1 C)     Temp Source 07/02/22 1541 Oral     SpO2 07/02/22 1541 96 %     Weight 07/02/22 1613 226 lb 10.1 oz (102.8 kg)     Height 07/02/22 1613 6' (1.829 m)     Head Circumference --      Peak Flow --      Pain Score 07/02/22 1613 0     Pain Loc --      Pain Edu? --      Excl. in GC? --     Most recent vital signs: Vitals:   07/02/22 1541  BP: 119/75  Pulse: 95  Resp: 16  Temp: 98.8 F (37.1 C)  SpO2: 96%     General: Awake, no distress.   CV:  Good peripheral perfusion. regular rate and  rhythm Resp:  Normal effort.  Abd:  No distention.   Other:      ED Results / Procedures / Treatments   Labs (all labs ordered are listed, but only abnormal results are displayed) Labs Reviewed - No data to display   EKG     RADIOLOGY     PROCEDURES:   Procedures   MEDICATIONS ORDERED IN ED: Medications  heparin flush 10 UNIT/ML injection 10 Units (has no administration in time range)     IMPRESSION / MDM / ASSESSMENT AND PLAN / ED COURSE  I reviewed the triage vital signs and the nursing notes.                              Differential diagnosis includes, but is not limited to, infected PICC line, clotted PICC line, nursing air  Patient's presentation is most consistent with acute, uncomplicated illness.   Our nursing  staff was able to flush the PICC line with 20 mL of normal saline.  Therefore the patient was allowed to use his antibiotic while here in triage.  We wanted to ensure that he was able to push the medication without any difficulty.  We will flush his line with a heparin flush.  Patient will be discharged home.  We will look for an extender.      FINAL CLINICAL IMPRESSION(S) / ED DIAGNOSES   Final diagnoses:  Status post peripherally inserted central catheter (PICC) central line placement     Rx / DC Orders   ED Discharge Orders     None        Note:  This document was prepared using Dragon voice recognition software and may include unintentional dictation errors.    Faythe Ghee, PA-C 07/02/22 1655    Minna Antis, MD 07/02/22 2030

## 2022-07-02 NOTE — ED Triage Notes (Addendum)
Pt to ED via POV from home. Pt states home health nurse tried to flush PICC line and was unable to do so. Pt currently receiving IV antibiotics for left foot infection.   This RN able to flush 10cc in and then flush another 10cc in. Pt denies any symptoms.

## 2022-07-03 DIAGNOSIS — M86172 Other acute osteomyelitis, left ankle and foot: Secondary | ICD-10-CM | POA: Diagnosis not present

## 2022-07-03 DIAGNOSIS — Z89422 Acquired absence of other left toe(s): Secondary | ICD-10-CM | POA: Diagnosis not present

## 2022-07-03 DIAGNOSIS — E1169 Type 2 diabetes mellitus with other specified complication: Secondary | ICD-10-CM | POA: Diagnosis not present

## 2022-07-03 DIAGNOSIS — Z792 Long term (current) use of antibiotics: Secondary | ICD-10-CM | POA: Diagnosis not present

## 2022-07-03 DIAGNOSIS — E1140 Type 2 diabetes mellitus with diabetic neuropathy, unspecified: Secondary | ICD-10-CM | POA: Diagnosis not present

## 2022-07-03 DIAGNOSIS — Z452 Encounter for adjustment and management of vascular access device: Secondary | ICD-10-CM | POA: Diagnosis not present

## 2022-07-03 DIAGNOSIS — K219 Gastro-esophageal reflux disease without esophagitis: Secondary | ICD-10-CM | POA: Diagnosis not present

## 2022-07-03 DIAGNOSIS — E7800 Pure hypercholesterolemia, unspecified: Secondary | ICD-10-CM | POA: Diagnosis not present

## 2022-07-03 DIAGNOSIS — Z4781 Encounter for orthopedic aftercare following surgical amputation: Secondary | ICD-10-CM | POA: Diagnosis not present

## 2022-07-03 DIAGNOSIS — Z8616 Personal history of COVID-19: Secondary | ICD-10-CM | POA: Diagnosis not present

## 2022-07-03 DIAGNOSIS — Z9181 History of falling: Secondary | ICD-10-CM | POA: Diagnosis not present

## 2022-07-03 DIAGNOSIS — Z794 Long term (current) use of insulin: Secondary | ICD-10-CM | POA: Diagnosis not present

## 2022-07-04 DIAGNOSIS — M86172 Other acute osteomyelitis, left ankle and foot: Secondary | ICD-10-CM | POA: Diagnosis not present

## 2022-07-05 DIAGNOSIS — M86172 Other acute osteomyelitis, left ankle and foot: Secondary | ICD-10-CM | POA: Diagnosis not present

## 2022-07-05 DIAGNOSIS — Z89432 Acquired absence of left foot: Secondary | ICD-10-CM | POA: Diagnosis not present

## 2022-07-05 DIAGNOSIS — Z89412 Acquired absence of left great toe: Secondary | ICD-10-CM | POA: Diagnosis not present

## 2022-07-06 ENCOUNTER — Encounter: Payer: Self-pay | Admitting: Family Medicine

## 2022-07-06 ENCOUNTER — Ambulatory Visit (INDEPENDENT_AMBULATORY_CARE_PROVIDER_SITE_OTHER): Payer: BC Managed Care – PPO | Admitting: Family Medicine

## 2022-07-06 VITALS — BP 119/74 | HR 87 | Temp 98.3°F | Wt 227.0 lb

## 2022-07-06 DIAGNOSIS — E785 Hyperlipidemia, unspecified: Secondary | ICD-10-CM

## 2022-07-06 DIAGNOSIS — E11628 Type 2 diabetes mellitus with other skin complications: Secondary | ICD-10-CM | POA: Diagnosis not present

## 2022-07-06 DIAGNOSIS — M86172 Other acute osteomyelitis, left ankle and foot: Secondary | ICD-10-CM

## 2022-07-06 DIAGNOSIS — R7401 Elevation of levels of liver transaminase levels: Secondary | ICD-10-CM

## 2022-07-06 DIAGNOSIS — Z89432 Acquired absence of left foot: Secondary | ICD-10-CM

## 2022-07-06 DIAGNOSIS — L089 Local infection of the skin and subcutaneous tissue, unspecified: Secondary | ICD-10-CM

## 2022-07-06 DIAGNOSIS — R350 Frequency of micturition: Secondary | ICD-10-CM | POA: Diagnosis not present

## 2022-07-06 DIAGNOSIS — E1169 Type 2 diabetes mellitus with other specified complication: Secondary | ICD-10-CM

## 2022-07-06 DIAGNOSIS — E1140 Type 2 diabetes mellitus with diabetic neuropathy, unspecified: Secondary | ICD-10-CM | POA: Diagnosis not present

## 2022-07-06 DIAGNOSIS — I10 Essential (primary) hypertension: Secondary | ICD-10-CM

## 2022-07-06 DIAGNOSIS — Z794 Long term (current) use of insulin: Secondary | ICD-10-CM

## 2022-07-06 DIAGNOSIS — I739 Peripheral vascular disease, unspecified: Secondary | ICD-10-CM

## 2022-07-06 LAB — URINALYSIS, ROUTINE W REFLEX MICROSCOPIC
Bilirubin, UA: NEGATIVE
Glucose, UA: NEGATIVE
Leukocytes,UA: NEGATIVE
Nitrite, UA: NEGATIVE
Specific Gravity, UA: >1.03 — ABNORMAL HIGH (ref 1.005–1.030)
Urobilinogen, Ur: 0.2 mg/dL (ref 0.2–1.0)
pH, UA: 5.5 (ref 5.0–7.5)

## 2022-07-06 LAB — MICROSCOPIC EXAMINATION: WBC, UA: NONE SEEN /hpf (ref 0–5)

## 2022-07-06 NOTE — Assessment & Plan Note (Signed)
Has PICC line in place. Feeling well. Seeing ID next week. Will check labs today and send to them. Continue packing and following with podiatry and ID as scheduled. Call with any concerns.  

## 2022-07-06 NOTE — Assessment & Plan Note (Signed)
Rechecking labs today. Await results. Treat as needed.  °

## 2022-07-06 NOTE — Assessment & Plan Note (Signed)
Continue to follow with podiatry. Continue antibiotics. Follow up as scheduled.

## 2022-07-06 NOTE — Assessment & Plan Note (Signed)
Keep BP, sugar and cholesterol under good control. Continue to follow with podiatry. Call with any concerns.

## 2022-07-06 NOTE — Progress Notes (Signed)
BP 119/74   Pulse 87   Temp 98.3 F (36.8 C)   Wt 227 lb (103 kg)   SpO2 97%   BMI 30.79 kg/m    Subjective:    Patient ID: [name], male    DOB: Feb 17, 1961, 61 y.o.   MRN: [id number]  HPI: [name] is a 61 y.o. male  Chief Complaint  Patient presents with  . Hospitalization Follow-up    Patient states he was in the hospital was admitted for a wound infection. Patient was diagnosed with Osteomyelitis of foot. Patient had follow up with surgeon yesterday.    Transition of Care Hospital Follow up.   Hospital/Facility: [facility] D/C Physician: Dr. Kurtis Bushman D/C Date: 06/27/22  Records Requested: 07/06/22 Records Received: 07/06/22 Records Reviewed: 07/06/22  Diagnoses on Discharge:   Osteomyelitis of foot, left, acute ([place])   Hyperlipidemia associated with type 2 diabetes mellitus ([place])   Type 2 diabetes mellitus with diabetic neuropathy, with long-term current use of insulin ([facility])   Diabetic foot infection ([place])   GERD (gastroesophageal reflux disease)   Transaminitis   Essential hypertension  Date of interactive Contact within 48 hours of discharge: 06/27/22 Contact was through: phone  Date of 7 day or 14 day face-to-face visit:  07/06/22  within 14 days  Outpatient Encounter Medications as of 07/06/2022  Medication Sig  . amLODipine (NORVASC) 5 MG tablet Take 1 tablet (5 mg total) by mouth daily.  Marland Kitchen atorvastatin (LIPITOR) 20 MG tablet Take 1 tablet (20 mg total) by mouth every other day.  Marland Kitchen ceFAZolin (ANCEF) IVPB Inject 2 g into the vein every 8 (eight) hours for 22 days. Indication:  MSSA osteomyelitis of left foot First Dose: Yes Last Day of Therapy:  07/19/2022 Labs - Once weekly:  CBC/D, CMP, ESR, CRP Please pull PIC at completion of IV antibiotics Fax weekly lab results  promptly to [phone] Method of administration: IV Push Method of administration may be changed at the discretion of home infusion pharmacist based upon assessment of the patient and/or  caregiver's ability to self-administer the medication ordered.  . Chlorhexidine Gluconate Cloth 2 % PADS Apply 6 each topically daily.  Marland Kitchen glipiZIDE (GLUCOTROL) 5 MG tablet Take 1 tablet (5 mg total) by mouth 2 (two) times daily before a meal.  . glucose blood (ACCU-CHEK GUIDE) test strip 1 each by Other route daily.  . metFORMIN (GLUCOPHAGE) 1000 MG tablet Take 1 tablet (1,000 mg total) by mouth 2 (two) times daily with a meal.  . Ascorbic Acid (VITAMIN C CR) 1500 MG TBCR Take 1,500 mg by mouth daily. (Patient not taking: Reported on 07/06/2022)  . aspirin EC 325 MG tablet Take 325 mg by mouth daily. (Patient not taking: Reported on 07/06/2022)  . B Complex Vitamins (B COMPLEX 1 PO) Take 1 tablet by mouth daily. (Patient not taking: Reported on 07/06/2022)  . BD PEN NEEDLE MICRO U/F 32G X 6 MM MISC USE 1  ONCE DAILY AS NEEDED (Patient not taking: Reported on 07/06/2022)  . Cinnamon 500 MG capsule Take 500 mg by mouth daily. (Patient not taking: Reported on 07/06/2022)  . Garlic 0814 MG CAPS Take 1,000 mg by mouth daily. (Patient not taking: Reported on 07/06/2022)  . mupirocin ointment (BACTROBAN) 2 % Apply 1 Application topically 2 (two) times daily. (Patient not taking: Reported on 07/06/2022)   No facility-administered encounter medications on file as of 07/06/2022.  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  He had recently been treated with 14-day course of Keflex as an outpatient.  Presented to the ED on 06/20/2021 after being seen in clinic where the wound was noted to have become more red swollen and with increased drainage.  Patient was admitted to the hospital for IV antibiotics.  Podiatry and ID were consulted.  Status post I&D and  transmetatarsal amputation left foot.  He was found with Left foot osteomyelitis due to MSSA.  He had a PICC placement and was sent home with IV antibiotics per ID orders.   Osteomyelitis of foot, left, acute ([place]) Due to nonhealing diabetic foot ulcer.  Presented with worsening swelling and drainage despite 14-day course of Keflex.  MRI of the left foot shows severe diffuse cellulitis mostly over the medial aspect of the foot, multiple subcutaneous abscesses, diffuse osteomyelitis involving the first and second metatarsals, as well as severe midfoot degenerative changes felt to be neuropathic. Podiatry and ID consulted. Status post I&D and transmetatarsal amputation left foot.  S/p PICC today Will discharge with iv antibiotics Cefazolin 2 grams Iv every 8 hours Duration: 4 weeks End Date:07/19/22   PIC Care Per Protocol:   Labs weekly while on IV antibiotics: _X_ CBC with differential   _X_ CMP _X_ CRP _X_ ESR   _X_ Please pull PIC at completion of IV antibiotics   Essential hypertension Continue amlodipine F/u with pcp for further mx   Transaminitis Presented with AST 193, ALT 187 with otherwise normal hepatic function other than albumin 3.0.  Acute hepatitis panel negative.  Patient denies alcohol use.  Right upper quadrant ultrasound showed cholelithiasis without signs of acute cholecystitis or biliary ductal dilation.  Liver appeared normal on ultrasound.  LFT trended down F/u with pcp for further monitoring   GERD (gastroesophageal reflux disease) Continue PPI   Diabetic foot infection ([place]) See acute osteomyelitis as above   Type 2 diabetes mellitus with diabetic neuropathy, with long-term current use of insulin ([place]) Well-controlled with last A1c 6.2%.   Continue home meds   Hyperlipidemia associated with type 2 diabetes mellitus ([place]) Lipitor initially held due to transaminitis, then resumed F/u with pcp"  Diagnostic Tests Reviewed: CLINICAL DATA:  Infection and  concern for osteomyelitis.  Diabetes.   EXAM: LEFT FOOT - COMPLETE 3+ VIEW   COMPARISON:  Left foot radiograph dated 01/07/2021.   FINDINGS: Status post prior amputation of the midportion of the first metatarsal and second phalanges. Several small bone densities adjacent to the first metatarsal amputation may be chronic. Osteomyelitis is not excluded. There is irregularity of the second metatarsal likely related to chronic infection or less likely old healed fracture. There is sclerotic changes and irregularity of the third metatarsophalangeal joint consistent with chronic osteomyelitis. There is no acute fracture or dislocation. The bones are osteopenic. There is soft tissue thickening of the forefoot primarily over the first and second ray. No soft tissue gas.   IMPRESSION: 1. No acute fracture or dislocation. 2. 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 3. Soft tissue thickening of the forefoot and chronic changes of the second metatarsal and third MTP joint. MRI or a white blood cell nuclear scan may provide better evaluation if there is high clinical concern for acute osteomyelitis.  CLINICAL DATA:  Transaminitis.  EXAM: ULTRASOUND ABDOMEN LIMITED RIGHT UPPER QUADRANT   COMPARISON:  None Available.   FINDINGS: Gallbladder:   Numerous small less than 1 cm gallstones are seen. No evidence of gallbladder dilatation or wall thickening. No sonographic Murphy sign noted by sonographer.   Common bile duct:   Diameter: 5 mm, within normal limits.   Liver:   No focal lesion identified. Within normal limits in parenchymal echogenicity. Portal vein is patent on color Doppler imaging with normal direction of blood flow towards the liver.   Other: None.   IMPRESSION: Cholelithiasis. No sonographic signs of cholecystitis or biliary ductal dilatation.   Unremarkable sonographic appearance of liver.  CLINICAL DATA:   Foot pain and swelling. History of prior amputations.   EXAM: MRI OF THE LEFT FOREFOOT WITHOUT AND WITH CONTRAST   TECHNIQUE: Multiplanar, multisequence MR imaging of the left foot was performed both before and after administration of intravenous contrast.   CONTRAST:  66m GADAVIST GADOBUTROL 1 MMOL/ML IV SOLN   COMPARISON:  Radiographs 06/20/2022   FINDINGS: Severe/diffuse soft tissue swelling/edema consistent with severe cellulitis most notably over the medial aspect of the foot. There are multiple abscesses. The largest is a curvilinear abscess wrapping around the partially amputated first metatarsal this measures a maximum of 4 cm. Second largest abscess is in the dorsal aspect of the midfoot and this measures 4.0 x 3.1 x 1.5 cm. Several other smaller abscesses noted.   Diffuse osteomyelitis involving the first and second metatarsals. Chronic appearing deformity of the third MTP joint but I do not see any definite findings to suggest septic arthritis or osteomyelitis.   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   IMPRESSION: 1. Severe/diffuse cellulitis most notably over the medial aspect of the foot. 2. Multiple subcutaneous abscesses. 3. Diffuse osteomyelitis involving the first and second metatarsals. 4. 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  Disposition: Home  Consults: Podiatry, ID  Discharge Instructions:  Follow up with PCP in 1 week Please obtain BMP/CBC in one week Please follow up with ID Clinic  Appt: 07/12/22 at 10.30am  Disease/illness Education:  Discussed today  Home Health/Community Services Discussions/Referrals:  In place  Establishment or re-establishment of referral orders for community resources:  In place  Discussion with other health care providers: N/A  Assessment and Support of treatment regimen adherence: Good  Appointments Coordinated with: Patient  Education for self-management, independent  living, and ADLs: Discussed today  JStephfonnotes that he has been feeling all right since getting home from the hospital. He has home health coming out. They have not done any PT with him yet, but they are giving him his antibiotics. He has not had any fevers or chills. He's waiting on his bandages and they haven't come yet. He's been packing his foot. After the antibiotics he has had some aching and haziness feeling. No significant pain. He is due to see ID next week and has already seen podiatry. No other concerns or complaints at this time.   Relevant past medical, surgical, family and social history reviewed and updated as indicated. Interim medical history since our last visit reviewed. Allergies and medications reviewed and updated.  Review of Systems  Constitutional: Negative.   Respiratory: Negative.    Cardiovascular: Negative.   Gastrointestinal: Negative.   Genitourinary:  Positive for frequency and urgency. Negative for decreased urine volume, difficulty urinating, dysuria, enuresis, flank pain, genital sores, hematuria, penile discharge, penile pain, penile swelling, scrotal swelling and testicular pain.  Musculoskeletal: Negative.   Skin: Negative.  Neurological: Negative.   Psychiatric/Behavioral: Negative.      Per HPI unless specifically indicated above     Objective:    BP 119/74   Pulse 87   Temp 98.3 F (36.8 C)   Wt 227 lb (103 kg)   SpO2 97%   BMI 30.79 kg/m   Wt Readings from Last 3 Encounters:  07/06/22 227 lb (103 kg)  07/02/22 226 lb 10.1 oz (102.8 kg)  06/27/22 226 lb 10.1 oz (102.8 kg)    Physical Exam Vitals and nursing note reviewed.  Constitutional:      General: He is not in acute distress.    Appearance: Normal appearance. He is obese. He is not ill-appearing, toxic-appearing or diaphoretic.  HENT:     Head: Normocephalic and atraumatic.     Right Ear: External ear normal.     Left Ear: External ear normal.     Nose: Nose normal.      Mouth/Throat:     Mouth: Mucous membranes are moist.     Pharynx: Oropharynx is clear.  Eyes:     General: No scleral icterus.       Right eye: No discharge.        Left eye: No discharge.     Extraocular Movements: Extraocular movements intact.     Conjunctiva/sclera: Conjunctivae normal.     Pupils: Pupils are equal, round, and reactive to light.  Cardiovascular:     Rate and Rhythm: Normal rate and regular rhythm.     Pulses: Normal pulses.     Heart sounds: Normal heart sounds. No murmur heard.    No friction rub. No gallop.  Pulmonary:     Effort: Pulmonary effort is normal. No respiratory distress.     Breath sounds: Normal breath sounds. No stridor. No wheezing, rhonchi or rales.  Chest:     Chest wall: No tenderness.  Musculoskeletal:        General: Deformity present.     Cervical back: Normal range of motion and neck supple.     Comments: Amputation of L forefoot and toes   Skin:    General: Skin is warm and dry.     Capillary Refill: Capillary refill takes less than 2 seconds.     Coloration: Skin is not jaundiced or pale.     Findings: No bruising, erythema, lesion or rash.  Neurological:     General: No focal deficit present.     Mental Status: He is alert and oriented to person, place, and time. Mental status is at baseline.  Psychiatric:        Mood and Affect: Mood normal.        Behavior: Behavior normal.        Thought Content: Thought content normal.        Judgment: Judgment normal.    Results for orders placed or performed during the hospital encounter of 06/20/22  Culture, blood (Routine x 2)   Specimen: BLOOD  Result Value Ref Range   Specimen Description BLOOD BLOOD LEFT FOREARM    Special Requests      BOTTLES DRAWN AEROBIC AND ANAEROBIC Blood Culture adequate volume   Culture      NO GROWTH 5 DAYS Performed at [facility], [address] [place], [place] [address]    Report Status 06/25/2022 FINAL   Culture, blood (Routine x 2)    Specimen: BLOOD  Result Value Ref Range   Specimen Description BLOOD BLOOD LEFT ARM  Special Requests      BOTTLES DRAWN AEROBIC AND ANAEROBIC Blood Culture adequate volume   Culture      NO GROWTH 5 DAYS Performed at [facility], [address] [place], [place] [address]    Report Status 06/25/2022 FINAL   MRSA Next Gen by PCR, Nasal   Specimen: Nasal Mucosa; Nasal Swab  Result Value Ref Range   MRSA by PCR Next Gen NOT DETECTED NOT DETECTED  Aerobic/Anaerobic Culture w Gram Stain (surgical/deep wound)   Specimen: PATH Other; Tissue  Result Value Ref Range   Specimen Description      BONE Performed at [facility], [address] [place], [place] [address]    Special Requests LEFT 2ND METATARSAL    Gram Stain      NO SQUAMOUS EPITHELIAL CELLS SEEN NO WBC SEEN NO ORGANISMS SEEN    Culture      RARE STAPHYLOCOCCUS AUREUS SUSCEPTIBILITIES PERFORMED ON PREVIOUS CULTURE WITHIN THE LAST 5 DAYS. NO ANAEROBES ISOLATED Performed at [facility], [place] [address], [place], [place] [address]    Report Status 06/26/2022 FINAL   Aerobic/Anaerobic Culture w Gram Stain (surgical/deep wound)   Specimen: PATH Other; Tissue  Result Value Ref Range   Specimen Description WOUND LEFT FOOT    Special Requests DEEP    Gram Stain      NO SQUAMOUS EPITHELIAL CELLS SEEN NO WBC SEEN NO ORGANISMS SEEN    Culture      FEW STAPHYLOCOCCUS AUREUS SUSCEPTIBILITIES PERFORMED ON PREVIOUS CULTURE WITHIN THE LAST 5 DAYS. NO ANAEROBES ISOLATED Performed at [facility], [place] [address] [place], [place] [address]    Report Status 06/26/2022 FINAL   Aerobic/Anaerobic Culture w Gram Stain (surgical/deep wound)   Specimen: PATH Other; Wound  Result Value Ref Range   Specimen Description      BONE Performed at [facility], [address] [place], [place] [address]    Special Requests 1ST METATARSAL LEFT    Gram Stain      NO SQUAMOUS EPITHELIAL  CELLS SEEN RARE WBC SEEN NO ORGANISMS SEEN    Culture      RARE STAPHYLOCOCCUS AUREUS NO ANAEROBES ISOLATED CRITICAL RESULT CALLED TO, READ BACK BY AND VERIFIED WITH: DPM CLINE [id number] FCP Performed at [facility], [place] [address] [place],  [address]    Report Status 06/26/2022 FINAL    Organism ID, Bacteria STAPHYLOCOCCUS AUREUS       Susceptibility   Staphylococcus aureus - MIC*    CIPROFLOXACIN <=0.5 SENSITIVE Sensitive     ERYTHROMYCIN <=0.25 SENSITIVE Sensitive     GENTAMICIN <=0.5 SENSITIVE Sensitive     OXACILLIN <=0.25 SENSITIVE Sensitive     TETRACYCLINE <=1 SENSITIVE Sensitive     VANCOMYCIN <=0.5 SENSITIVE Sensitive     TRIMETH/SULFA <=10 SENSITIVE Sensitive     CLINDAMYCIN <=0.25 SENSITIVE Sensitive     RIFAMPIN <=0.5 SENSITIVE Sensitive     Inducible Clindamycin NEGATIVE Sensitive     * RARE STAPHYLOCOCCUS AUREUS  Comprehensive metabolic panel  Result Value Ref Range   Sodium 137 135 - 145 mmol/L   Potassium 4.2 3.5 - 5.1 mmol/L   Chloride 101 98 - 111 mmol/L   CO2 25 22 - 32 mmol/L   Glucose, Bld 166 (H) 70 - 99 mg/dL   BUN 30 (H) 6 - 20 mg/dL   Creatinine, Ser 1.18 0.61 - 1.24 mg/dL   Calcium 8.7 (L) 8.9 - 10.3 mg/dL  Total Protein 8.1 6.5 - 8.1 g/dL   Albumin 3.0 (L) 3.5 - 5.0 g/dL   AST 193 (H) 15 - 41 U/L   ALT 187 (H) 0 - 44 U/L   Alkaline Phosphatase 85 38 - 126 U/L   Total Bilirubin 0.6 0.3 - 1.2 mg/dL   GFR, Estimated >60 >60 mL/min   Anion gap 11 5 - 15  Lactic acid, plasma  Result Value Ref Range   Lactic Acid, Venous 1.0 0.5 - 1.9 mmol/L  Lactic acid, plasma  Result Value Ref Range   Lactic Acid, Venous 0.9 0.5 - 1.9 mmol/L  CBC with Differential  Result Value Ref Range   WBC 13.9 (H) 4.0 - 10.5 K/uL   RBC 3.63 (L) 4.22 - 5.81 MIL/uL   Hemoglobin 11.0 (L) 13.0 - 17.0 g/dL   HCT 33.8 (L) 39.0 - 52.0 %   MCV 93.1 80.0 - 100.0 fL   MCH 30.3 26.0 - 34.0 pg   MCHC 32.5 30.0 - 36.0 g/dL   RDW 12.3 11.5 - 15.5 %    Platelets 392 150 - 400 K/uL   nRBC 0.0 0.0 - 0.2 %   Neutrophils Relative % 85 %   Neutro Abs 11.7 (H) 1.7 - 7.7 K/uL   Lymphocytes Relative 9 %   Lymphs Abs 1.3 0.7 - 4.0 K/uL   Monocytes Relative 4 %   Monocytes Absolute 0.6 0.1 - 1.0 K/uL   Eosinophils Relative 1 %   Eosinophils Absolute 0.2 0.0 - 0.5 K/uL   Basophils Relative 0 %   Basophils Absolute 0.1 0.0 - 0.1 K/uL   Immature Granulocytes 1 %   Abs Immature Granulocytes 0.08 (H) 0.00 - 0.07 K/uL  Sedimentation rate  Result Value Ref Range   Sed Rate 115 (H) 0 - 20 mm/hr  C-reactive protein  Result Value Ref Range   CRP 24.8 (H) <1.0 mg/dL  Hepatitis panel, acute  Result Value Ref Range   Hepatitis B Surface Ag NON REACTIVE NON REACTIVE   HCV Ab NON REACTIVE NON REACTIVE   Hep A IgM NON REACTIVE NON REACTIVE   Hep B C IgM NON REACTIVE NON REACTIVE  Hemoglobin A1c  Result Value Ref Range   Hgb A1c MFr Bld 6.2 (H) 4.8 - 5.6 %   Mean Plasma Glucose 131.24 mg/dL  HIV Antibody (routine testing w rflx)  Result Value Ref Range   HIV Screen 4th Generation wRfx Non Reactive Non Reactive  Comprehensive metabolic panel  Result Value Ref Range   Sodium 136 135 - 145 mmol/L   Potassium 4.0 3.5 - 5.1 mmol/L   Chloride 104 98 - 111 mmol/L   CO2 25 22 - 32 mmol/L   Glucose, Bld 111 (H) 70 - 99 mg/dL   BUN 21 (H) 6 - 20 mg/dL   Creatinine, Ser 0.86 0.61 - 1.24 mg/dL   Calcium 8.2 (L) 8.9 - 10.3 mg/dL   Total Protein 6.9 6.5 - 8.1 g/dL   Albumin 2.6 (L) 3.5 - 5.0 g/dL   AST 151 (H) 15 - 41 U/L   ALT 189 (H) 0 - 44 U/L   Alkaline Phosphatase 72 38 - 126 U/L   Total Bilirubin 0.7 0.3 - 1.2 mg/dL   GFR, Estimated >60 >60 mL/min   Anion gap 7 5 - 15  CBC  Result Value Ref Range   WBC 9.4 4.0 - 10.5 K/uL   RBC 3.38 (L) 4.22 - 5.81 MIL/uL   Hemoglobin 10.2 (L) 13.0 -  17.0 g/dL   HCT 30.7 (L) 39.0 - 52.0 %   MCV 90.8 80.0 - 100.0 fL   MCH 30.2 26.0 - 34.0 pg   MCHC 33.2 30.0 - 36.0 g/dL   RDW 12.0 11.5 - 15.5 %   Platelets  302 150 - 400 K/uL   nRBC 0.0 0.0 - 0.2 %  Glucose, capillary  Result Value Ref Range   Glucose-Capillary 103 (H) 70 - 99 mg/dL  Glucose, capillary  Result Value Ref Range   Glucose-Capillary 120 (H) 70 - 99 mg/dL   Comment 1 Notify RN   Glucose, capillary  Result Value Ref Range   Glucose-Capillary 126 (H) 70 - 99 mg/dL  Comprehensive metabolic panel  Result Value Ref Range   Sodium 137 135 - 145 mmol/L   Potassium 4.4 3.5 - 5.1 mmol/L   Chloride 109 98 - 111 mmol/L   CO2 25 22 - 32 mmol/L   Glucose, Bld 187 (H) 70 - 99 mg/dL   BUN 13 6 - 20 mg/dL   Creatinine, Ser 0.78 0.61 - 1.24 mg/dL   Calcium 8.0 (L) 8.9 - 10.3 mg/dL   Total Protein 6.9 6.5 - 8.1 g/dL   Albumin 2.5 (L) 3.5 - 5.0 g/dL   AST 72 (H) 15 - 41 U/L   ALT 142 (H) 0 - 44 U/L   Alkaline Phosphatase 66 38 - 126 U/L   Total Bilirubin 0.5 0.3 - 1.2 mg/dL   GFR, Estimated >60 >60 mL/min   Anion gap 3 (L) 5 - 15  CBC  Result Value Ref Range   WBC 11.7 (H) 4.0 - 10.5 K/uL   RBC 3.39 (L) 4.22 - 5.81 MIL/uL   Hemoglobin 10.1 (L) 13.0 - 17.0 g/dL   HCT 31.2 (L) 39.0 - 52.0 %   MCV 92.0 80.0 - 100.0 fL   MCH 29.8 26.0 - 34.0 pg   MCHC 32.4 30.0 - 36.0 g/dL   RDW 12.1 11.5 - 15.5 %   Platelets 279 150 - 400 K/uL   nRBC 0.0 0.0 - 0.2 %  Magnesium  Result Value Ref Range   Magnesium 1.7 1.7 - 2.4 mg/dL  Glucose, capillary  Result Value Ref Range   Glucose-Capillary 116 (H) 70 - 99 mg/dL  Glucose, capillary  Result Value Ref Range   Glucose-Capillary 101 (H) 70 - 99 mg/dL   Comment 1 Notify RN   Glucose, capillary  Result Value Ref Range   Glucose-Capillary 229 (H) 70 - 99 mg/dL  Glucose, capillary  Result Value Ref Range   Glucose-Capillary 176 (H) 70 - 99 mg/dL  Glucose, capillary  Result Value Ref Range   Glucose-Capillary 231 (H) 70 - 99 mg/dL   Comment 1 Notify RN    Comment 2 Document in Chart   Glucose, capillary  Result Value Ref Range   Glucose-Capillary 260 (H) 70 - 99 mg/dL   Comment 1  Notify RN   Comprehensive metabolic panel  Result Value Ref Range   Sodium 137 135 - 145 mmol/L   Potassium 4.1 3.5 - 5.1 mmol/L   Chloride 108 98 - 111 mmol/L   CO2 25 22 - 32 mmol/L   Glucose, Bld 149 (H) 70 - 99 mg/dL   BUN 12 6 - 20 mg/dL   Creatinine, Ser 0.82 0.61 - 1.24 mg/dL   Calcium 8.1 (L) 8.9 - 10.3 mg/dL   Total Protein 6.9 6.5 - 8.1 g/dL   Albumin 2.5 (L) 3.5 - 5.0 g/dL  AST 28 15 - 41 U/L   ALT 94 (H) 0 - 44 U/L   Alkaline Phosphatase 63 38 - 126 U/L   Total Bilirubin 0.4 0.3 - 1.2 mg/dL   GFR, Estimated >60 >60 mL/min   Anion gap 4 (L) 5 - 15  CBC  Result Value Ref Range   WBC 10.7 (H) 4.0 - 10.5 K/uL   RBC 3.35 (L) 4.22 - 5.81 MIL/uL   Hemoglobin 10.1 (L) 13.0 - 17.0 g/dL   HCT 30.7 (L) 39.0 - 52.0 %   MCV 91.6 80.0 - 100.0 fL   MCH 30.1 26.0 - 34.0 pg   MCHC 32.9 30.0 - 36.0 g/dL   RDW 12.1 11.5 - 15.5 %   Platelets 274 150 - 400 K/uL   nRBC 0.0 0.0 - 0.2 %  Magnesium  Result Value Ref Range   Magnesium 1.9 1.7 - 2.4 mg/dL  Glucose, capillary  Result Value Ref Range   Glucose-Capillary 223 (H) 70 - 99 mg/dL  Glucose, capillary  Result Value Ref Range   Glucose-Capillary 155 (H) 70 - 99 mg/dL  Glucose, capillary  Result Value Ref Range   Glucose-Capillary 178 (H) 70 - 99 mg/dL   Comment 1 Notify RN    Comment 2 Document in Chart   Glucose, capillary  Result Value Ref Range   Glucose-Capillary 214 (H) 70 - 99 mg/dL   Comment 1 Notify RN    Comment 2 Document in Chart   CBC  Result Value Ref Range   WBC 11.3 (H) 4.0 - 10.5 K/uL   RBC 3.56 (L) 4.22 - 5.81 MIL/uL   Hemoglobin 11.1 (L) 13.0 - 17.0 g/dL   HCT 32.5 (L) 39.0 - 52.0 %   MCV 91.3 80.0 - 100.0 fL   MCH 31.2 26.0 - 34.0 pg   MCHC 34.2 30.0 - 36.0 g/dL   RDW 11.9 11.5 - 15.5 %   Platelets 282 150 - 400 K/uL   nRBC 0.0 0.0 - 0.2 %  Basic metabolic panel  Result Value Ref Range   Sodium 138 135 - 145 mmol/L   Potassium 4.0 3.5 - 5.1 mmol/L   Chloride 106 98 - 111 mmol/L   CO2 25  22 - 32 mmol/L   Glucose, Bld 151 (H) 70 - 99 mg/dL   BUN 11 6 - 20 mg/dL   Creatinine, Ser 0.69 0.61 - 1.24 mg/dL   Calcium 8.6 (L) 8.9 - 10.3 mg/dL   GFR, Estimated >60 >60 mL/min   Anion gap 7 5 - 15  Glucose, capillary  Result Value Ref Range   Glucose-Capillary 199 (H) 70 - 99 mg/dL  Glucose, capillary  Result Value Ref Range   Glucose-Capillary 162 (H) 70 - 99 mg/dL   Comment 1 Notify RN    Comment 2 Document in Chart   Glucose, capillary  Result Value Ref Range   Glucose-Capillary 238 (H) 70 - 99 mg/dL   Comment 1 Notify RN    Comment 2 Document in Chart   Glucose, capillary  Result Value Ref Range   Glucose-Capillary 198 (H) 70 - 99 mg/dL  CBC  Result Value Ref Range   WBC 9.3 4.0 - 10.5 K/uL   RBC 3.67 (L) 4.22 - 5.81 MIL/uL   Hemoglobin 11.0 (L) 13.0 - 17.0 g/dL   HCT 33.0 (L) 39.0 - 52.0 %   MCV 89.9 80.0 - 100.0 fL   MCH 30.0 26.0 - 34.0 pg   MCHC 33.3 30.0 - 36.0  g/dL   RDW 12.0 11.5 - 15.5 %   Platelets 267 150 - 400 K/uL   nRBC 0.0 0.0 - 0.2 %  Glucose, capillary  Result Value Ref Range   Glucose-Capillary 207 (H) 70 - 99 mg/dL  Glucose, capillary  Result Value Ref Range   Glucose-Capillary 165 (H) 70 - 99 mg/dL  Glucose, capillary  Result Value Ref Range   Glucose-Capillary 204 (H) 70 - 99 mg/dL  Comprehensive metabolic panel  Result Value Ref Range   Sodium 137 135 - 145 mmol/L   Potassium 4.1 3.5 - 5.1 mmol/L   Chloride 102 98 - 111 mmol/L   CO2 29 22 - 32 mmol/L   Glucose, Bld 174 (H) 70 - 99 mg/dL   BUN 15 6 - 20 mg/dL   Creatinine, Ser 0.80 0.61 - 1.24 mg/dL   Calcium 9.0 8.9 - 10.3 mg/dL   Total Protein 8.0 6.5 - 8.1 g/dL   Albumin 2.7 (L) 3.5 - 5.0 g/dL   AST 41 15 - 41 U/L   ALT 77 (H) 0 - 44 U/L   Alkaline Phosphatase 71 38 - 126 U/L   Total Bilirubin 0.5 0.3 - 1.2 mg/dL   GFR, Estimated >60 >60 mL/min   Anion gap 6 5 - 15  Glucose, capillary  Result Value Ref Range   Glucose-Capillary 213 (H) 70 - 99 mg/dL  Glucose, capillary   Result Value Ref Range   Glucose-Capillary 222 (H) 70 - 99 mg/dL   Comment 1 Notify RN   Glucose, capillary  Result Value Ref Range   Glucose-Capillary 189 (H) 70 - 99 mg/dL  Glucose, capillary  Result Value Ref Range   Glucose-Capillary 225 (H) 70 - 99 mg/dL  Glucose, capillary  Result Value Ref Range   Glucose-Capillary 187 (H) 70 - 99 mg/dL  Glucose, capillary  Result Value Ref Range   Glucose-Capillary 212 (H) 70 - 99 mg/dL  Glucose, capillary  Result Value Ref Range   Glucose-Capillary 176 (H) 70 - 99 mg/dL  Glucose, capillary  Result Value Ref Range   Glucose-Capillary 232 (H) 70 - 99 mg/dL  Surgical pathology  Result Value Ref Range   SURGICAL PATHOLOGY      SURGICAL PATHOLOGY CASE: [id number] [phone] PATIENT: Cliffton Colmenares Surgical Pathology Report     Specimen Submitted: A. Forefoot, left  Clinical History: Incision and drainage with debridement bone      DIAGNOSIS: A. FOREFOOT, LEFT; TRANSMETATARSAL AMPUTATION: - THIRD AND FOURTH TOES WITH NECROSIS. - SOFT TISSUE ULCERATION AND NECROSIS, FOCALLY INVOLVING PROXIMAL RESECTION MARGIN. - STATUS POST AMPUTATION OF FIRST AND SECOND TOES; DISTAL PORTIONS OF METATARSALS WITH OSTEOMYELITIS. - ARTICULAR SURFACE OF BONE AT PROXIMAL MARGIN OF FIRST METATARSAL WITH FOCAL NECROSIS AND OSTEOMYELITIS. - ARTICULAR SURFACE OF BONE AT PROXIMAL MARGIN OF SECOND METATARSAL; NEGATIVE FOR OSTEOMYELITIS.  GROSS DESCRIPTION: A. Labeled: Left forefoot Received: Fresh Collection time: 6:57 PM on 06/21/2022 Placed into formalin time: 7:58 PM on 06/21/2022 Size: 11.8 x 11.2 x 5.5 cm Description of lesion(s): Received is a distal portion of foot with the third, fourth, and fifth toes pr esent, each with a nail.  The second and great toe have previously been amputated.  The skin at the prior amputation sites is tan and finely lobulated with a 1.5 x 0.7 cm area of ulceration at the medial aspect.  The distal aspect of the third  and fourth toes have black firm skin discoloration.  Additionally received in the same container is a 5 x 3.8  x 2.4 cm portion of bone.  The bone has a disarticulated surface with an opposing ill-defined roughened surface. Proximal margin: The proximal margin has tan lobulated skin, tan soft tissue with focal areas of hemorrhage and tan firm bone.  The third, fourth, and fifth metatarsals are cut at the resection margin.  The second metatarsal is disarticulated at the resection margin.  The additional portion of bone is presumed to be the great toe metatarsal with the disarticulated aspect presumed to be at the resection margin. Bone: The great and second toe metatarsals have areas of softened disruption proximally.  The remaining  bone is tan, firm, and grossly unremarkable. Other findings: None grossly appreciated.  Block summary: 1 - 4 - great toe disarticulated bone at presumed resection margin, perpendicularly sectioned and submitted entirely 5 - 8 - second toe disarticulated bone at resection margin, perpendicularly sectioned and submitted entirely 9 - representative ulceration with closest approach to skin and soft tissue resection margin 10 - representative third and fourth toe skin discoloration 11 - representative disrupted bone from great toe 12 - representative disrupted bone from second toe  Tissue decalcification: Cassette 1-8, 11, 12  RB 06/22/2022  Final Diagnosis performed by Quay Burow, MD.   Electronically signed 06/26/2022 9:24:59AM The electronic signature indicates that the named Attending Pathologist has evaluated the specimen Technical component performed at [facility], [address] [place], [place] [address] Lab: [phone] Dir: Rush Farmer, MD, MMM  Professional  component performed at [facility], [facility], [place], [place], [place] [address] Lab: [phone] Dir: Kathi Simpers, MD       Assessment & Plan:   Problem  List Items Addressed This Visit       Cardiovascular and Mediastinum   PAD (peripheral artery disease) ([place])    Keep BP, sugar and cholesterol under good control. Continue to follow with podiatry. Call with any concerns.      Essential hypertension    Under good control on current regimen. Continue current regimen. Continue to monitor. Call with any concerns.         Endocrine   Hyperlipidemia associated with type 2 diabetes mellitus ([place])    Under good control on current regimen. Continue current regimen. Continue to monitor. Call with any concerns.       Type 2 diabetes mellitus with diabetic neuropathy, with long-term current use of insulin ([facility])    Doing well with A1c of 6.2 in the hospital. Continue current regimen. Continue to monitor. Due for recheck in middle of October. Call with any concenrs.       Diabetic foot infection ([place])    Has PICC line in place. Feeling well. Seeing ID next week. Will check labs today and send to them. Continue packing and following with podiatry and ID as scheduled. Call with any concerns.         Musculoskeletal and Integument   Osteomyelitis of foot, left, acute ([place]) - Primary    Has PICC line in place. Feeling well. Seeing ID next week. Will check labs today and send to them. Continue packing and following with podiatry and ID as scheduled. Call with any concerns.       Relevant Orders   Comprehensive metabolic panel   CBC with Differential/Platelet   C-reactive protein   Sed Rate (ESR)   Comprehensive metabolic panel   CBC with Differential/Platelet   C-reactive protein   Sed Rate (ESR)     Other   History of transmetatarsal amputation of  left foot ([place])    Continue to follow with podiatry. Continue antibiotics. Follow up as scheduled.      Transaminitis    Rechecking labs today. Await results. Treat as needed.       Other Visit Diagnoses     Frequency of urination       Will check UA. Await results. Treat as needed.     Relevant Orders   Urinalysis, Routine w reflex microscopic        Follow up plan: Return in about 6 weeks (around 08/17/2022) for with PCP.   >25 minutes spent in chart review and with patient today

## 2022-07-06 NOTE — Assessment & Plan Note (Signed)
Doing well with A1c of 6.2 in the hospital. Continue current regimen. Continue to monitor. Due for recheck in middle of October. Call with any concenrs.

## 2022-07-06 NOTE — Assessment & Plan Note (Signed)
Under good control on current regimen. Continue current regimen. Continue to monitor. Call with any concerns.   

## 2022-07-06 NOTE — Assessment & Plan Note (Signed)
Has PICC line in place. Feeling well. Seeing ID next week. Will check labs today and send to them. Continue packing and following with podiatry and ID as scheduled. Call with any concerns.

## 2022-07-07 DIAGNOSIS — M86172 Other acute osteomyelitis, left ankle and foot: Secondary | ICD-10-CM | POA: Diagnosis not present

## 2022-07-07 LAB — CBC WITH DIFFERENTIAL/PLATELET
Basophils Absolute: 0.1 10*3/uL (ref 0.0–0.2)
Basos: 1 %
EOS (ABSOLUTE): 0.1 10*3/uL (ref 0.0–0.4)
Eos: 1 %
Hematocrit: 35.9 % — ABNORMAL LOW (ref 37.5–51.0)
Hemoglobin: 12.2 g/dL — ABNORMAL LOW (ref 13.0–17.7)
Immature Grans (Abs): 0.1 10*3/uL (ref 0.0–0.1)
Immature Granulocytes: 1 %
Lymphocytes Absolute: 1.6 10*3/uL (ref 0.7–3.1)
Lymphs: 19 %
MCH: 31.1 pg (ref 26.6–33.0)
MCHC: 34 g/dL (ref 31.5–35.7)
MCV: 92 fL (ref 79–97)
Monocytes Absolute: 0.4 10*3/uL (ref 0.1–0.9)
Monocytes: 5 %
Neutrophils Absolute: 6.1 10*3/uL (ref 1.4–7.0)
Neutrophils: 73 %
Platelets: 214 10*3/uL (ref 150–450)
RBC: 3.92 x10E6/uL — ABNORMAL LOW (ref 4.14–5.80)
RDW: 13.6 % (ref 11.6–15.4)
WBC: 8.3 10*3/uL (ref 3.4–10.8)

## 2022-07-07 LAB — COMPREHENSIVE METABOLIC PANEL
ALT: 13 IU/L (ref 0–44)
AST: 28 IU/L (ref 0–40)
Albumin/Globulin Ratio: 1.3 (ref 1.2–2.2)
Albumin: 4.1 g/dL (ref 3.8–4.9)
Alkaline Phosphatase: 72 IU/L (ref 44–121)
BUN/Creatinine Ratio: 22 — ABNORMAL HIGH (ref 9–20)
BUN: 20 mg/dL (ref 6–24)
Bilirubin Total: 0.4 mg/dL (ref 0.0–1.2)
CO2: 23 mmol/L (ref 20–29)
Calcium: 9.3 mg/dL (ref 8.7–10.2)
Chloride: 102 mmol/L (ref 96–106)
Creatinine, Ser: 0.92 mg/dL (ref 0.76–1.27)
Globulin, Total: 3.2 g/dL (ref 1.5–4.5)
Glucose: 85 mg/dL (ref 70–99)
Potassium: 4.3 mmol/L (ref 3.5–5.2)
Sodium: 140 mmol/L (ref 134–144)
Total Protein: 7.3 g/dL (ref 6.0–8.5)
eGFR: 96 mL/min/{1.73_m2} (ref >59)

## 2022-07-07 LAB — SEDIMENTATION RATE: Sed Rate: 76 mm/hr — ABNORMAL HIGH (ref 0–30)

## 2022-07-07 LAB — C-REACTIVE PROTEIN: CRP: 2 mg/L (ref 0–10)

## 2022-07-08 DIAGNOSIS — M86172 Other acute osteomyelitis, left ankle and foot: Secondary | ICD-10-CM | POA: Diagnosis not present

## 2022-07-09 DIAGNOSIS — M86172 Other acute osteomyelitis, left ankle and foot: Secondary | ICD-10-CM | POA: Diagnosis not present

## 2022-07-09 DIAGNOSIS — Z89422 Acquired absence of other left toe(s): Secondary | ICD-10-CM | POA: Diagnosis not present

## 2022-07-09 DIAGNOSIS — E1169 Type 2 diabetes mellitus with other specified complication: Secondary | ICD-10-CM | POA: Diagnosis not present

## 2022-07-09 DIAGNOSIS — Z4781 Encounter for orthopedic aftercare following surgical amputation: Secondary | ICD-10-CM | POA: Diagnosis not present

## 2022-07-09 NOTE — Addendum Note (Signed)
Addended by: Dorcas Carrow on: 07/09/2022 09:03 AM   Modules accepted: Level of Service

## 2022-07-10 DIAGNOSIS — Z9181 History of falling: Secondary | ICD-10-CM | POA: Diagnosis not present

## 2022-07-10 DIAGNOSIS — Z794 Long term (current) use of insulin: Secondary | ICD-10-CM | POA: Diagnosis not present

## 2022-07-10 DIAGNOSIS — Z452 Encounter for adjustment and management of vascular access device: Secondary | ICD-10-CM | POA: Diagnosis not present

## 2022-07-10 DIAGNOSIS — E1169 Type 2 diabetes mellitus with other specified complication: Secondary | ICD-10-CM | POA: Diagnosis not present

## 2022-07-10 DIAGNOSIS — M86172 Other acute osteomyelitis, left ankle and foot: Secondary | ICD-10-CM | POA: Diagnosis not present

## 2022-07-10 DIAGNOSIS — Z8616 Personal history of COVID-19: Secondary | ICD-10-CM | POA: Diagnosis not present

## 2022-07-10 DIAGNOSIS — E7800 Pure hypercholesterolemia, unspecified: Secondary | ICD-10-CM | POA: Diagnosis not present

## 2022-07-10 DIAGNOSIS — Z4781 Encounter for orthopedic aftercare following surgical amputation: Secondary | ICD-10-CM | POA: Diagnosis not present

## 2022-07-10 DIAGNOSIS — E1140 Type 2 diabetes mellitus with diabetic neuropathy, unspecified: Secondary | ICD-10-CM | POA: Diagnosis not present

## 2022-07-10 DIAGNOSIS — K219 Gastro-esophageal reflux disease without esophagitis: Secondary | ICD-10-CM | POA: Diagnosis not present

## 2022-07-10 DIAGNOSIS — Z792 Long term (current) use of antibiotics: Secondary | ICD-10-CM | POA: Diagnosis not present

## 2022-07-10 DIAGNOSIS — Z89422 Acquired absence of other left toe(s): Secondary | ICD-10-CM | POA: Diagnosis not present

## 2022-07-10 DIAGNOSIS — M86129 Other acute osteomyelitis, unspecified humerus: Secondary | ICD-10-CM | POA: Diagnosis not present

## 2022-07-10 NOTE — Anesthesia Postprocedure Evaluation (Signed)
Anesthesia Post Note  Patient: [name]  Procedure(s) Performed: IRRIGATION AND DEBRIDEMENT ABSCESS (Left: Foot) TRANSMETATARSAL AMPUTATION-1st and 2nd (Left: Foot)  Patient location during evaluation: PACU Anesthesia Type: General Level of consciousness: awake and alert Pain management: pain level controlled Vital Signs Assessment: post-procedure vital signs reviewed and stable Respiratory status: spontaneous breathing, nonlabored ventilation, respiratory function stable and patient connected to nasal cannula oxygen Cardiovascular status: blood pressure returned to baseline and stable Postop Assessment: no apparent nausea or vomiting Anesthetic complications: no   No notable events documented.   Last Vitals:  Vitals:   06/27/22 0839 06/27/22 1601  BP: (!) 146/84 (!) 164/81  Pulse: 78 84  Resp: 18 18  Temp: 36.7 C 36.6 C  SpO2: 97% 96%    Last Pain:  Vitals:   06/27/22 0840  TempSrc:   PainSc: 0-No pain                 Yevette Edwards

## 2022-07-11 DIAGNOSIS — M86172 Other acute osteomyelitis, left ankle and foot: Secondary | ICD-10-CM | POA: Diagnosis not present

## 2022-07-12 ENCOUNTER — Encounter: Payer: Self-pay | Admitting: Infectious Diseases

## 2022-07-12 ENCOUNTER — Ambulatory Visit: Payer: BC Managed Care – PPO | Attending: Infectious Diseases | Admitting: Infectious Diseases

## 2022-07-12 VITALS — BP 123/78 | HR 85 | Temp 97.2°F

## 2022-07-12 DIAGNOSIS — E669 Obesity, unspecified: Secondary | ICD-10-CM | POA: Diagnosis not present

## 2022-07-12 DIAGNOSIS — M868X7 Other osteomyelitis, ankle and foot: Secondary | ICD-10-CM | POA: Insufficient documentation

## 2022-07-12 DIAGNOSIS — M86172 Other acute osteomyelitis, left ankle and foot: Secondary | ICD-10-CM | POA: Diagnosis not present

## 2022-07-12 DIAGNOSIS — L089 Local infection of the skin and subcutaneous tissue, unspecified: Secondary | ICD-10-CM | POA: Diagnosis not present

## 2022-07-12 DIAGNOSIS — Z7984 Long term (current) use of oral hypoglycemic drugs: Secondary | ICD-10-CM

## 2022-07-12 DIAGNOSIS — K219 Gastro-esophageal reflux disease without esophagitis: Secondary | ICD-10-CM | POA: Diagnosis not present

## 2022-07-12 DIAGNOSIS — B957 Other staphylococcus as the cause of diseases classified elsewhere: Secondary | ICD-10-CM | POA: Insufficient documentation

## 2022-07-12 DIAGNOSIS — Z89432 Acquired absence of left foot: Secondary | ICD-10-CM

## 2022-07-12 DIAGNOSIS — Z89422 Acquired absence of other left toe(s): Secondary | ICD-10-CM | POA: Insufficient documentation

## 2022-07-12 DIAGNOSIS — L02612 Cutaneous abscess of left foot: Secondary | ICD-10-CM

## 2022-07-12 DIAGNOSIS — Z89412 Acquired absence of left great toe: Secondary | ICD-10-CM | POA: Insufficient documentation

## 2022-07-12 DIAGNOSIS — E785 Hyperlipidemia, unspecified: Secondary | ICD-10-CM | POA: Insufficient documentation

## 2022-07-12 DIAGNOSIS — E1169 Type 2 diabetes mellitus with other specified complication: Secondary | ICD-10-CM | POA: Diagnosis not present

## 2022-07-12 DIAGNOSIS — E11628 Type 2 diabetes mellitus with other skin complications: Secondary | ICD-10-CM

## 2022-07-12 DIAGNOSIS — M869 Osteomyelitis, unspecified: Secondary | ICD-10-CM

## 2022-07-12 NOTE — Progress Notes (Signed)
NAME: [name]  DOB: [birth year]  MRN: [id number]  Date/Time: 07/12/2022 10:39 AM  Subjective:  Here with his brother ?follow up visit after recent hospitalization at [facility] between 06/20/2022 until 06/27/2022. [name] is a 61 y.o. male with a history of diabetes mellitus, hyperlipidemia, obesity, GERD had presented to the hospital on 06/20/2022 with a 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  He had a past history of osteomyelitis with amputation of the left first and second toes in January 2022. Marland Kitchen He underwent TMA left foot during this hospitalization.  Bone pathology showed proximal margin positive for osteomyelitis of the disarticulated met head.  Culture was positive for methicillin sensitive Staph aureus.  After being on IV antibiotics he was discharged home to continue cefazolin IV 2 g every 8 hours for 4 weeks which she will complete on 07/19/2022 Blood culture was negative during that hospitalization Patient is doing better He wants to know whether he can stop the IV antibiotic He initially had some trouble with the PICC line and had to come to the ED as it was not drawing blood.  But he is able to give the IV antibiotic without any problem Today he has an appointment with Dr. Cleda Mccreedy.  He still has staples on the foot    Past Medical History:  Diagnosis Date   Arthritis    hands   COVID-19 12/29/2019   Diabetes mellitus without complication ([place])    type 2   GERD (gastroesophageal reflux disease)    Hypercholesterolemia    Osteomyelitis of left foot [facility])     Past Surgical History:  Procedure Laterality Date   ADENOIDECTOMY     AMPUTATION Right 10/07/2020   Procedure: AMPUTATION RAY-Right [place];  Surgeon: Caroline More, DPM;  Location: [facility];  Service: Podiatry;  Laterality: Right;   AMPUTATION Left 01/07/2021   Procedure: AMPUTATION RAY - Left 1st and 2nd Ray;  Surgeon: Caroline More, DPM;  Location: [facility];  Service: Podiatry;  Laterality:  Left;   AMPUTATION TOE     CATARACT EXTRACTION W/PHACO Right 03/03/2020   Procedure: CATARACT EXTRACTION PHACO AND INTRAOCULAR LENS PLACEMENT ([place]) RIGHT DIABETIC VISION BLUE;  Surgeon: Marchia Meiers, MD;  Location: [facility];  Service: Ophthalmology;  Laterality: Right;  COVID ( + ) 12-29-2019  30.97 02:27.7   CATARACT EXTRACTION W/PHACO Left 06/23/2020   Procedure: CATARACT EXTRACTION PHACO AND INTRAOCULAR LENS PLACEMENT ([facility]) LEFT DIABETIC VISION BLUE 5.14  00:34.7;  Surgeon: Marchia Meiers, MD;  Location: [facility];  Service: Ophthalmology;  Laterality: Left;  DIABETIC   ELBOW FRACTURE SURGERY Right    EXTERNAL EAR SURGERY     IRRIGATION AND DEBRIDEMENT ABSCESS Left 06/21/2022   Procedure: IRRIGATION AND DEBRIDEMENT ABSCESS;  Surgeon: Sharlotte Alamo, DPM;  Location: [facility];  Service: Podiatry;  Laterality: Left;   IRRIGATION AND DEBRIDEMENT FOOT Right 10/07/2020   Procedure: IRRIGATION AND DEBRIDEMENT FOOT;  Surgeon: Caroline More, DPM;  Location: [facility];  Service: Podiatry;  Laterality: Right;   TRANSMETATARSAL AMPUTATION Left 06/21/2022   Procedure: TRANSMETATARSAL AMPUTATION-1st and 2nd;  Surgeon: Sharlotte Alamo, DPM;  Location: [facility];  Service: Podiatry;  Laterality: Left;    Social History   Socioeconomic History   Marital status: Single    Spouse name: Not on file   Number of children: Not on file   Years of education: Not on file   Highest education level: Not on file  Occupational History   Not on file  Tobacco Use   Smoking status: Never   Smokeless tobacco: Never  Vaping Use   Vaping Use: Never used  Substance and Sexual Activity   Alcohol use: Not Currently    Comment: occasionally   Drug use: No   Sexual activity: Not Currently  Other Topics Concern   Not on file  Social History Narrative   Not on file   Social Determinants of Health   Financial Resource Strain: Not on file  Food Insecurity: Not on file  Transportation Needs: Not on file   Physical Activity: Not on file  Stress: Not on file  Social Connections: Not on file  Intimate Partner Violence: Not on file    Family History  Problem Relation Age of Onset   Dementia Mother    Hypertension Mother    Cancer Brother        Cancer   Diabetes Maternal Grandmother    Hypertension Maternal Grandmother    COPD Neg Hx    Heart disease Neg Hx    Stroke Neg Hx    No Known Allergies I? Current Outpatient Medications  Medication Sig Dispense Refill   amLODipine (NORVASC) 5 MG tablet Take 1 tablet (5 mg total) by mouth daily. 30 tablet 0   Ascorbic Acid (VITAMIN C CR) 1500 MG TBCR Take 1,500 mg by mouth daily.     aspirin EC 325 MG tablet Take 325 mg by mouth daily.     atorvastatin (LIPITOR) 20 MG tablet Take 1 tablet (20 mg total) by mouth every other day. 90 tablet 1   B Complex Vitamins (B COMPLEX 1 PO) Take 1 tablet by mouth daily.     BD PEN NEEDLE MICRO U/F 32G X 6 MM MISC USE 1  ONCE DAILY AS NEEDED 100 each 2   ceFAZolin (ANCEF) IVPB Inject 2 g into the vein every 8 (eight) hours for 22 days. Indication:  MSSA osteomyelitis of left foot First Dose: Yes Last Day of Therapy:  07/19/2022 Labs - Once weekly:  CBC/D, CMP, ESR, CRP Please pull PIC at completion of IV antibiotics Fax weekly lab results  promptly to [phone] Method of administration: IV Push Method of administration may be changed at the discretion of home infusion pharmacist based upon assessment of the patient and/or caregiver's ability to self-administer the medication ordered. 66 Units 0   Chlorhexidine Gluconate Cloth 2 % PADS Apply 6 each topically daily. 180 each 0   Cinnamon 500 MG capsule Take 500 mg by mouth daily.     Garlic 7494 MG CAPS Take 1,000 mg by mouth daily.     glipiZIDE (GLUCOTROL) 5 MG tablet Take 1 tablet (5 mg total) by mouth 2 (two) times daily before a meal. 180 tablet 1   glucose blood (ACCU-CHEK GUIDE) test strip 1 each by Other route daily. 100 each 1   metFORMIN  (GLUCOPHAGE) 1000 MG tablet Take 1 tablet (1,000 mg total) by mouth 2 (two) times daily with a meal. 180 tablet 1   mupirocin ointment (BACTROBAN) 2 % Apply 1 Application topically 2 (two) times daily. 22 g 2   No current facility-administered medications for this visit.     Abtx:  Anti-infectives (From admission, onward)    None       REVIEW OF SYSTEMS:  Const: negative fever, negative chills, negative weight loss Eyes: negative diplopia or visual changes, negative eye pain ENT: negative coryza, negative sore throat Resp: negative cough, hemoptysis, dyspnea Cards: negative for chest pain, palpitations, lower  extremity edema GU: negative for frequency, dysuria and hematuria GI: Negative for abdominal pain, diarrhea, bleeding, constipation Skin: negative for rash and pruritus Heme: negative for easy bruising and gum/nose bleeding MS: negative for myalgias, arthralgias, back pain and muscle weakness Neurolo:negative for headaches, dizziness, vertigo, memory problems  Psych: negative for feelings of anxiety, depression  Endocrine: negative for thyroid, diabetes Allergy/Immunology- negative for any medication or food allergies ? Objective:  VITALS:  BP 123/78   Pulse 85   Temp (!) 97.2 F (36.2 C) (Temporal)   SpO2 97%  LDA Right PICC site looks okay.  No erythema PHYSICAL EXAM:  General: Alert, cooperative, no distress, appears stated age.  Head: Normocephalic, without obvious abnormality, atraumatic. Lungs: Clear to auscultation bilaterally. No Wheezing or Rhonchi. No rales. Heart: Regular rate and rhythm, no murmur, rub or gallop. Abdomen: Soft, non-tender,not distended. Bowel sounds normal. No masses Extremities: Left foot TMA The surgical site has healed nicely Staples are present The plantar aspect where there was some the I&D site is a bit on the small hole and it can be barely packed 07/12/22     06/24/22     Skin: No rashes or lesions. Or bruising Lymph:  Cervical, supraclavicular normal. Neurologic: Grossly non-focal Pertinent Labs Lab Results 07/10/2022 ESR 34 CRP less than 1 Normal WBC Normal creatinine    Impression/Recommendation ? Diabetic foot infection of the left foot with abscesses and osteomyelitis of the first and second met head.  Status post TMA.  Status post debridement of the abscess Culture positive for MSSA Bone pathology proximal margin of the disarticulated material was positive for osteo- Patient is currently on cefazolin and will finish 4 weeks on 07/19/2022 The wound is healed very nicely He has a follow-up with the podiatrist today. Patient wanted to know whether we can stop IV antibiotics today.  Let him know that it would be better to complete the antibiotic on 07/19/2022 and then he would not need any further oral medications after that The PICC line will be removed at home by the visiting nurse  Discussed the management with the patient and with his brother in detail. Follow-up as needed ? ___________________________________________________ Discussed with patient, requesting provider Note:  This document was prepared using Dragon voice recognition software and may include unintentional dictation errors.

## 2022-07-12 NOTE — Patient Instructions (Signed)
You are here for follow up of the left foot infection- you will complete 4 weeks of IV cefazolin on 07/19/22 and PICC line can be removed after that. You wont need any antibiotics beyond 07/19/22 If your picc line is giving problem and you are not able to give the antibiotic, then we will remove it and switch to oral antibiotic until 07/19/22

## 2022-07-13 DIAGNOSIS — M86172 Other acute osteomyelitis, left ankle and foot: Secondary | ICD-10-CM | POA: Diagnosis not present

## 2022-07-14 DIAGNOSIS — M86172 Other acute osteomyelitis, left ankle and foot: Secondary | ICD-10-CM | POA: Diagnosis not present

## 2022-07-15 DIAGNOSIS — M86172 Other acute osteomyelitis, left ankle and foot: Secondary | ICD-10-CM | POA: Diagnosis not present

## 2022-07-16 DIAGNOSIS — M86172 Other acute osteomyelitis, left ankle and foot: Secondary | ICD-10-CM | POA: Diagnosis not present

## 2022-07-17 DIAGNOSIS — Z89422 Acquired absence of other left toe(s): Secondary | ICD-10-CM | POA: Diagnosis not present

## 2022-07-17 DIAGNOSIS — Z9181 History of falling: Secondary | ICD-10-CM | POA: Diagnosis not present

## 2022-07-17 DIAGNOSIS — M86172 Other acute osteomyelitis, left ankle and foot: Secondary | ICD-10-CM | POA: Diagnosis not present

## 2022-07-17 DIAGNOSIS — Z794 Long term (current) use of insulin: Secondary | ICD-10-CM | POA: Diagnosis not present

## 2022-07-17 DIAGNOSIS — Z8616 Personal history of COVID-19: Secondary | ICD-10-CM | POA: Diagnosis not present

## 2022-07-17 DIAGNOSIS — E7800 Pure hypercholesterolemia, unspecified: Secondary | ICD-10-CM | POA: Diagnosis not present

## 2022-07-17 DIAGNOSIS — E1169 Type 2 diabetes mellitus with other specified complication: Secondary | ICD-10-CM | POA: Diagnosis not present

## 2022-07-17 DIAGNOSIS — Z452 Encounter for adjustment and management of vascular access device: Secondary | ICD-10-CM | POA: Diagnosis not present

## 2022-07-17 DIAGNOSIS — Z792 Long term (current) use of antibiotics: Secondary | ICD-10-CM | POA: Diagnosis not present

## 2022-07-17 DIAGNOSIS — K219 Gastro-esophageal reflux disease without esophagitis: Secondary | ICD-10-CM | POA: Diagnosis not present

## 2022-07-17 DIAGNOSIS — Z4781 Encounter for orthopedic aftercare following surgical amputation: Secondary | ICD-10-CM | POA: Diagnosis not present

## 2022-07-17 DIAGNOSIS — E1140 Type 2 diabetes mellitus with diabetic neuropathy, unspecified: Secondary | ICD-10-CM | POA: Diagnosis not present

## 2022-07-18 DIAGNOSIS — M86172 Other acute osteomyelitis, left ankle and foot: Secondary | ICD-10-CM | POA: Diagnosis not present

## 2022-07-19 DIAGNOSIS — M86172 Other acute osteomyelitis, left ankle and foot: Secondary | ICD-10-CM | POA: Diagnosis not present

## 2022-07-20 DIAGNOSIS — E7800 Pure hypercholesterolemia, unspecified: Secondary | ICD-10-CM | POA: Diagnosis not present

## 2022-07-20 DIAGNOSIS — K219 Gastro-esophageal reflux disease without esophagitis: Secondary | ICD-10-CM | POA: Diagnosis not present

## 2022-07-20 DIAGNOSIS — E1140 Type 2 diabetes mellitus with diabetic neuropathy, unspecified: Secondary | ICD-10-CM | POA: Diagnosis not present

## 2022-07-20 DIAGNOSIS — Z794 Long term (current) use of insulin: Secondary | ICD-10-CM | POA: Diagnosis not present

## 2022-07-20 DIAGNOSIS — E1169 Type 2 diabetes mellitus with other specified complication: Secondary | ICD-10-CM | POA: Diagnosis not present

## 2022-07-20 DIAGNOSIS — M86172 Other acute osteomyelitis, left ankle and foot: Secondary | ICD-10-CM | POA: Diagnosis not present

## 2022-07-20 DIAGNOSIS — Z4781 Encounter for orthopedic aftercare following surgical amputation: Secondary | ICD-10-CM | POA: Diagnosis not present

## 2022-07-20 DIAGNOSIS — Z452 Encounter for adjustment and management of vascular access device: Secondary | ICD-10-CM | POA: Diagnosis not present

## 2022-07-20 DIAGNOSIS — Z9181 History of falling: Secondary | ICD-10-CM | POA: Diagnosis not present

## 2022-07-20 DIAGNOSIS — Z8616 Personal history of COVID-19: Secondary | ICD-10-CM | POA: Diagnosis not present

## 2022-07-20 DIAGNOSIS — Z89422 Acquired absence of other left toe(s): Secondary | ICD-10-CM | POA: Diagnosis not present

## 2022-07-20 DIAGNOSIS — Z792 Long term (current) use of antibiotics: Secondary | ICD-10-CM | POA: Diagnosis not present

## 2022-07-23 DIAGNOSIS — E1140 Type 2 diabetes mellitus with diabetic neuropathy, unspecified: Secondary | ICD-10-CM | POA: Diagnosis not present

## 2022-07-23 DIAGNOSIS — Z792 Long term (current) use of antibiotics: Secondary | ICD-10-CM | POA: Diagnosis not present

## 2022-07-23 DIAGNOSIS — K219 Gastro-esophageal reflux disease without esophagitis: Secondary | ICD-10-CM | POA: Diagnosis not present

## 2022-07-23 DIAGNOSIS — Z89422 Acquired absence of other left toe(s): Secondary | ICD-10-CM | POA: Diagnosis not present

## 2022-07-23 DIAGNOSIS — E7800 Pure hypercholesterolemia, unspecified: Secondary | ICD-10-CM | POA: Diagnosis not present

## 2022-07-23 DIAGNOSIS — Z9181 History of falling: Secondary | ICD-10-CM | POA: Diagnosis not present

## 2022-07-23 DIAGNOSIS — Z4781 Encounter for orthopedic aftercare following surgical amputation: Secondary | ICD-10-CM | POA: Diagnosis not present

## 2022-07-23 DIAGNOSIS — E1169 Type 2 diabetes mellitus with other specified complication: Secondary | ICD-10-CM | POA: Diagnosis not present

## 2022-07-23 DIAGNOSIS — Z452 Encounter for adjustment and management of vascular access device: Secondary | ICD-10-CM | POA: Diagnosis not present

## 2022-07-23 DIAGNOSIS — M86172 Other acute osteomyelitis, left ankle and foot: Secondary | ICD-10-CM | POA: Diagnosis not present

## 2022-07-23 DIAGNOSIS — Z8616 Personal history of COVID-19: Secondary | ICD-10-CM | POA: Diagnosis not present

## 2022-07-23 DIAGNOSIS — Z794 Long term (current) use of insulin: Secondary | ICD-10-CM | POA: Diagnosis not present

## 2022-07-25 DIAGNOSIS — T8189XA Other complications of procedures, not elsewhere classified, initial encounter: Secondary | ICD-10-CM | POA: Diagnosis not present

## 2022-07-26 DIAGNOSIS — T8189XA Other complications of procedures, not elsewhere classified, initial encounter: Secondary | ICD-10-CM | POA: Diagnosis not present

## 2022-07-30 DIAGNOSIS — E7800 Pure hypercholesterolemia, unspecified: Secondary | ICD-10-CM | POA: Diagnosis not present

## 2022-07-30 DIAGNOSIS — Z452 Encounter for adjustment and management of vascular access device: Secondary | ICD-10-CM | POA: Diagnosis not present

## 2022-07-30 DIAGNOSIS — Z792 Long term (current) use of antibiotics: Secondary | ICD-10-CM | POA: Diagnosis not present

## 2022-07-30 DIAGNOSIS — E1140 Type 2 diabetes mellitus with diabetic neuropathy, unspecified: Secondary | ICD-10-CM | POA: Diagnosis not present

## 2022-07-30 DIAGNOSIS — M86172 Other acute osteomyelitis, left ankle and foot: Secondary | ICD-10-CM | POA: Diagnosis not present

## 2022-07-30 DIAGNOSIS — K219 Gastro-esophageal reflux disease without esophagitis: Secondary | ICD-10-CM | POA: Diagnosis not present

## 2022-07-30 DIAGNOSIS — Z4781 Encounter for orthopedic aftercare following surgical amputation: Secondary | ICD-10-CM | POA: Diagnosis not present

## 2022-07-30 DIAGNOSIS — Z8616 Personal history of COVID-19: Secondary | ICD-10-CM | POA: Diagnosis not present

## 2022-07-30 DIAGNOSIS — Z89422 Acquired absence of other left toe(s): Secondary | ICD-10-CM | POA: Diagnosis not present

## 2022-07-30 DIAGNOSIS — Z9181 History of falling: Secondary | ICD-10-CM | POA: Diagnosis not present

## 2022-07-30 DIAGNOSIS — E1169 Type 2 diabetes mellitus with other specified complication: Secondary | ICD-10-CM | POA: Diagnosis not present

## 2022-07-30 DIAGNOSIS — Z794 Long term (current) use of insulin: Secondary | ICD-10-CM | POA: Diagnosis not present

## 2022-08-08 DIAGNOSIS — E1140 Type 2 diabetes mellitus with diabetic neuropathy, unspecified: Secondary | ICD-10-CM | POA: Diagnosis not present

## 2022-08-08 DIAGNOSIS — Z9181 History of falling: Secondary | ICD-10-CM | POA: Diagnosis not present

## 2022-08-08 DIAGNOSIS — E1169 Type 2 diabetes mellitus with other specified complication: Secondary | ICD-10-CM | POA: Diagnosis not present

## 2022-08-08 DIAGNOSIS — K219 Gastro-esophageal reflux disease without esophagitis: Secondary | ICD-10-CM | POA: Diagnosis not present

## 2022-08-08 DIAGNOSIS — Z792 Long term (current) use of antibiotics: Secondary | ICD-10-CM | POA: Diagnosis not present

## 2022-08-08 DIAGNOSIS — Z452 Encounter for adjustment and management of vascular access device: Secondary | ICD-10-CM | POA: Diagnosis not present

## 2022-08-08 DIAGNOSIS — Z8616 Personal history of COVID-19: Secondary | ICD-10-CM | POA: Diagnosis not present

## 2022-08-08 DIAGNOSIS — E7800 Pure hypercholesterolemia, unspecified: Secondary | ICD-10-CM | POA: Diagnosis not present

## 2022-08-08 DIAGNOSIS — Z4781 Encounter for orthopedic aftercare following surgical amputation: Secondary | ICD-10-CM | POA: Diagnosis not present

## 2022-08-08 DIAGNOSIS — M86172 Other acute osteomyelitis, left ankle and foot: Secondary | ICD-10-CM | POA: Diagnosis not present

## 2022-08-08 DIAGNOSIS — Z89422 Acquired absence of other left toe(s): Secondary | ICD-10-CM | POA: Diagnosis not present

## 2022-08-08 DIAGNOSIS — Z794 Long term (current) use of insulin: Secondary | ICD-10-CM | POA: Diagnosis not present

## 2022-08-20 DIAGNOSIS — E1169 Type 2 diabetes mellitus with other specified complication: Secondary | ICD-10-CM | POA: Diagnosis not present

## 2022-08-20 DIAGNOSIS — Z8616 Personal history of COVID-19: Secondary | ICD-10-CM | POA: Diagnosis not present

## 2022-08-20 DIAGNOSIS — Z89422 Acquired absence of other left toe(s): Secondary | ICD-10-CM | POA: Diagnosis not present

## 2022-08-20 DIAGNOSIS — Z9181 History of falling: Secondary | ICD-10-CM | POA: Diagnosis not present

## 2022-08-20 DIAGNOSIS — M86172 Other acute osteomyelitis, left ankle and foot: Secondary | ICD-10-CM | POA: Diagnosis not present

## 2022-08-20 DIAGNOSIS — Z794 Long term (current) use of insulin: Secondary | ICD-10-CM | POA: Diagnosis not present

## 2022-08-20 DIAGNOSIS — Z792 Long term (current) use of antibiotics: Secondary | ICD-10-CM | POA: Diagnosis not present

## 2022-08-20 DIAGNOSIS — Z4781 Encounter for orthopedic aftercare following surgical amputation: Secondary | ICD-10-CM | POA: Diagnosis not present

## 2022-08-20 DIAGNOSIS — K219 Gastro-esophageal reflux disease without esophagitis: Secondary | ICD-10-CM | POA: Diagnosis not present

## 2022-08-20 DIAGNOSIS — Z452 Encounter for adjustment and management of vascular access device: Secondary | ICD-10-CM | POA: Diagnosis not present

## 2022-08-20 DIAGNOSIS — E7800 Pure hypercholesterolemia, unspecified: Secondary | ICD-10-CM | POA: Diagnosis not present

## 2022-08-20 DIAGNOSIS — E1140 Type 2 diabetes mellitus with diabetic neuropathy, unspecified: Secondary | ICD-10-CM | POA: Diagnosis not present

## 2022-08-22 DIAGNOSIS — B351 Tinea unguium: Secondary | ICD-10-CM | POA: Diagnosis not present

## 2022-08-22 DIAGNOSIS — Z89432 Acquired absence of left foot: Secondary | ICD-10-CM | POA: Diagnosis not present

## 2022-08-22 DIAGNOSIS — E1142 Type 2 diabetes mellitus with diabetic polyneuropathy: Secondary | ICD-10-CM | POA: Diagnosis not present

## 2022-09-04 ENCOUNTER — Encounter: Payer: Self-pay | Admitting: Nurse Practitioner

## 2022-09-04 ENCOUNTER — Ambulatory Visit (INDEPENDENT_AMBULATORY_CARE_PROVIDER_SITE_OTHER): Payer: BC Managed Care – PPO | Admitting: Nurse Practitioner

## 2022-09-04 VITALS — BP 130/76 | HR 78 | Temp 98.2°F | Wt 229.7 lb

## 2022-09-04 DIAGNOSIS — Z794 Long term (current) use of insulin: Secondary | ICD-10-CM

## 2022-09-04 DIAGNOSIS — Z1211 Encounter for screening for malignant neoplasm of colon: Secondary | ICD-10-CM | POA: Diagnosis not present

## 2022-09-04 DIAGNOSIS — E1140 Type 2 diabetes mellitus with diabetic neuropathy, unspecified: Secondary | ICD-10-CM | POA: Diagnosis not present

## 2022-09-04 DIAGNOSIS — D509 Iron deficiency anemia, unspecified: Secondary | ICD-10-CM | POA: Diagnosis not present

## 2022-09-04 DIAGNOSIS — I10 Essential (primary) hypertension: Secondary | ICD-10-CM | POA: Diagnosis not present

## 2022-09-04 MED ORDER — LISINOPRIL 10 MG PO TABS: 10.0000 mg | ORAL_TABLET | Freq: Every day | ORAL | 1 refills | Status: DC

## 2022-09-04 NOTE — Progress Notes (Signed)
BP 130/76   Pulse 78   Temp 98.2 F (36.8 C) (Oral)   Wt 229 lb 11.2 oz (104.2 kg)   SpO2 98%   BMI 31.15 kg/m    Subjective:    Patient ID: [name], male    DOB: 07-12-1961, 61 y.o.   MRN: [id number]  HPI: [name] is a 61 y.o. male  Chief Complaint  Patient presents with   Diabetes    6 week follow up.   Patient reports he was rx'd Htn medication at the ED but he never started it, he would like to discuss it with provider today.    DIABETES Hypoglycemic episodes:no Polydipsia/polyuria: no Visual disturbance: no Chest pain: no Paresthesias: no Glucose Monitoring: no  Accucheck frequency: Daily  Fasting glucose: 90-140  Post prandial:  Evening:  Before meals: Taking Insulin?: no  Long acting insulin:  Short acting insulin: Blood Pressure Monitoring: not checking Retinal Examination: Not up to Date Foot Exam: Up to Date Diabetic Education: Not Completed Pneumovax: Up to Date Influenza: Not up to Date Aspirin: no  Relevant past medical, surgical, family and social history reviewed and updated as indicated. Interim medical history since our last visit reviewed. Allergies and medications reviewed and updated.  Review of Systems  Eyes:  Negative for visual disturbance.  Respiratory:  Negative for chest tightness and shortness of breath.   Cardiovascular:  Negative for chest pain, palpitations and leg swelling.  Endocrine: Negative for polydipsia and polyuria.  Neurological:  Negative for dizziness, light-headedness, numbness and headaches.    Per HPI unless specifically indicated above     Objective:    BP 130/76   Pulse 78   Temp 98.2 F (36.8 C) (Oral)   Wt 229 lb 11.2 oz (104.2 kg)   SpO2 98%   BMI 31.15 kg/m   Wt Readings from Last 3 Encounters:  09/04/22 229 lb 11.2 oz (104.2 kg)  07/06/22 227 lb (103 kg)  07/02/22 226 lb 10.1 oz (102.8 kg)    Physical Exam Vitals and nursing note reviewed.  Constitutional:      General: He  is not in acute distress.    Appearance: Normal appearance. He is not ill-appearing, toxic-appearing or diaphoretic.  HENT:     Head: Normocephalic.     Right Ear: External ear normal.     Left Ear: External ear normal.     Nose: Nose normal. No congestion or rhinorrhea.     Mouth/Throat:     Mouth: Mucous membranes are moist.  Eyes:     General:        Right eye: No discharge.        Left eye: No discharge.     Extraocular Movements: Extraocular movements intact.     Conjunctiva/sclera: Conjunctivae normal.     Pupils: Pupils are equal, round, and reactive to light.  Cardiovascular:     Rate and Rhythm: Normal rate and regular rhythm.     Heart sounds: No murmur heard. Pulmonary:     Effort: Pulmonary effort is normal. No respiratory distress.     Breath sounds: Normal breath sounds. No wheezing, rhonchi or rales.  Abdominal:     General: Abdomen is flat. Bowel sounds are normal.  Musculoskeletal:     Cervical back: Normal range of motion and neck supple.  Skin:    General: Skin is warm and dry.     Capillary Refill: Capillary refill takes less than 2 seconds.  Neurological:     General:  No focal deficit present.     Mental Status: He is alert and oriented to person, place, and time.  Psychiatric:        Mood and Affect: Mood normal.        Behavior: Behavior normal.        Thought Content: Thought content normal.        Judgment: Judgment normal.     Results for orders placed or performed in visit on 07/06/22  Microscopic Examination   Urine  Result Value Ref Range   WBC, UA None seen 0 - 5 /hpf   RBC, Urine 0-2 0 - 2 /hpf   Epithelial Cells (non renal) 0-10 0 - 10 /hpf   Mucus, UA Present (A) Not Estab.   Bacteria, UA Few (A) None seen/Few  Comprehensive metabolic panel  Result Value Ref Range   Glucose 85 70 - 99 mg/dL   BUN 20 6 - 24 mg/dL   Creatinine, Ser 0.92 0.76 - 1.27 mg/dL   eGFR 96 >59 mL/min/1.73   BUN/Creatinine Ratio 22 (H) 9 - 20   Sodium 140  134 - 144 mmol/L   Potassium 4.3 3.5 - 5.2 mmol/L   Chloride 102 96 - 106 mmol/L   CO2 23 20 - 29 mmol/L   Calcium 9.3 8.7 - 10.2 mg/dL   Total Protein 7.3 6.0 - 8.5 g/dL   Albumin 4.1 3.8 - 4.9 g/dL   Globulin, Total 3.2 1.5 - 4.5 g/dL   Albumin/Globulin Ratio 1.3 1.2 - 2.2   Bilirubin Total 0.4 0.0 - 1.2 mg/dL   Alkaline Phosphatase 72 44 - 121 IU/L   AST 28 0 - 40 IU/L   ALT 13 0 - 44 IU/L  CBC with Differential/Platelet  Result Value Ref Range   WBC 8.3 3.4 - 10.8 x10E3/uL   RBC 3.92 (L) 4.14 - 5.80 x10E6/uL   Hemoglobin 12.2 (L) 13.0 - 17.7 g/dL   Hematocrit 35.9 (L) 37.5 - 51.0 %   MCV 92 79 - 97 fL   MCH 31.1 26.6 - 33.0 pg   MCHC 34.0 31.5 - 35.7 g/dL   RDW 13.6 11.6 - 15.4 %   Platelets 214 150 - 450 x10E3/uL   Neutrophils 73 Not Estab. %   Lymphs 19 Not Estab. %   Monocytes 5 Not Estab. %   Eos 1 Not Estab. %   Basos 1 Not Estab. %   Neutrophils Absolute 6.1 1.4 - 7.0 x10E3/uL   Lymphocytes Absolute 1.6 0.7 - 3.1 x10E3/uL   Monocytes Absolute 0.4 0.1 - 0.9 x10E3/uL   EOS (ABSOLUTE) 0.1 0.0 - 0.4 x10E3/uL   Basophils Absolute 0.1 0.0 - 0.2 x10E3/uL   Immature Granulocytes 1 Not Estab. %   Immature Grans (Abs) 0.1 0.0 - 0.1 x10E3/uL  C-reactive protein  Result Value Ref Range   CRP 2 0 - 10 mg/L  Sed Rate (ESR)  Result Value Ref Range   Sed Rate 76 (H) 0 - 30 mm/hr  Urinalysis, Routine w reflex microscopic  Result Value Ref Range   Specific Gravity, UA >1.030 (H) 1.005 - 1.030   pH, UA 5.5 5.0 - 7.5   Color, UA Yellow Yellow   Appearance Ur Cloudy (A) Clear   Leukocytes,UA Negative Negative   Protein,UA 3+ (A) Negative/Trace   Glucose, UA Negative Negative   Ketones, UA Trace (A) Negative   RBC, UA 1+ (A) Negative   Bilirubin, UA Negative Negative   Urobilinogen, Ur 0.2 0.2 - 1.0 mg/dL  Nitrite, UA Negative Negative   Microscopic Examination See below:       Assessment & Plan:   Problem List Items Addressed This Visit       Cardiovascular and  Mediastinum   Essential hypertension    Chronic.  Was given Amlodipine in the ER.  Will change to Lisinopril 72m due to the patient having diabetes.  Labs ordered today.  Return to clinic in 6 months for reevaluation.  Call sooner if concerns arise.        Relevant Medications   lisinopril (ZESTRIL) 10 MG tablet     Endocrine   Type 2 diabetes mellitus with diabetic neuropathy, with long-term current use of insulin (HCC) - Primary    Chronic.  Controlled.  Last A1c was 6.2.  Sugars are running 90-140 daily.  Continue with current medication regimen of Glipizide and Metformin.  Labs ordered today.  Return to clinic in 3 months for reevaluation.  Call sooner if concerns arise.        Relevant Medications   lisinopril (ZESTRIL) 10 MG tablet   Other Relevant Orders   Comp Met (CMET)   HgB A1c   Microalbumin, Urine Waived     Other   Iron deficiency anemia    Labs ordered at visit today.  Will make recommendations based on lab results.  Has been taking Iron supplement.  Will advise once labs are returned.       Relevant Orders   CBC w/Diff   Iron, TIBC and Ferritin Panel   Other Visit Diagnoses     Screening for colon cancer       Relevant Orders   Cologuard        Follow up plan: Return in about 3 months (around 12/04/2022) for HTN, HLD, DM2 FU.

## 2022-09-04 NOTE — Assessment & Plan Note (Signed)
Labs ordered at visit today.  Will make recommendations based on lab results.  Has been taking Iron supplement.  Will advise once labs are returned.

## 2022-09-04 NOTE — Assessment & Plan Note (Signed)
Chronic.  Was given Amlodipine in the ER.  Will change to Lisinopril 10mg  due to the patient having diabetes.  Labs ordered today.  Return to clinic in 6 months for reevaluation.  Call sooner if concerns arise.

## 2022-09-04 NOTE — Assessment & Plan Note (Signed)
Chronic.  Controlled.  Last A1c was 6.2.  Sugars are running 90-140 daily.  Continue with current medication regimen of Glipizide and Metformin.  Labs ordered today.  Return to clinic in 3 months for reevaluation.  Call sooner if concerns arise.

## 2022-09-05 LAB — CBC WITH DIFFERENTIAL/PLATELET
Basophils Absolute: 0 10*3/uL (ref 0.0–0.2)
Basos: 1 %
EOS (ABSOLUTE): 0.2 10*3/uL (ref 0.0–0.4)
Eos: 4 %
Hematocrit: 35.1 % — ABNORMAL LOW (ref 37.5–51.0)
Hemoglobin: 11.6 g/dL — ABNORMAL LOW (ref 13.0–17.7)
Immature Grans (Abs): 0 10*3/uL (ref 0.0–0.1)
Immature Granulocytes: 1 %
Lymphocytes Absolute: 2.1 10*3/uL (ref 0.7–3.1)
Lymphs: 32 %
MCH: 31 pg (ref 26.6–33.0)
MCHC: 33 g/dL (ref 31.5–35.7)
MCV: 94 fL (ref 79–97)
Monocytes Absolute: 0.5 10*3/uL (ref 0.1–0.9)
Monocytes: 8 %
Neutrophils Absolute: 3.5 10*3/uL (ref 1.4–7.0)
Neutrophils: 54 %
Platelets: 205 10*3/uL (ref 150–450)
RBC: 3.74 x10E6/uL — ABNORMAL LOW (ref 4.14–5.80)
RDW: 13.7 % (ref 11.6–15.4)
WBC: 6.3 10*3/uL (ref 3.4–10.8)

## 2022-09-05 LAB — COMPREHENSIVE METABOLIC PANEL
ALT: 21 IU/L (ref 0–44)
AST: 22 IU/L (ref 0–40)
Albumin/Globulin Ratio: 1.8 (ref 1.2–2.2)
Albumin: 4.2 g/dL (ref 3.8–4.9)
Alkaline Phosphatase: 58 IU/L (ref 44–121)
BUN/Creatinine Ratio: 20 (ref 9–20)
BUN: 19 mg/dL (ref 6–24)
Bilirubin Total: 0.3 mg/dL (ref 0.0–1.2)
CO2: 23 mmol/L (ref 20–29)
Calcium: 9 mg/dL (ref 8.7–10.2)
Chloride: 102 mmol/L (ref 96–106)
Creatinine, Ser: 0.96 mg/dL (ref 0.76–1.27)
Globulin, Total: 2.3 g/dL (ref 1.5–4.5)
Glucose: 128 mg/dL — ABNORMAL HIGH (ref 70–99)
Potassium: 4.2 mmol/L (ref 3.5–5.2)
Sodium: 138 mmol/L (ref 134–144)
Total Protein: 6.5 g/dL (ref 6.0–8.5)
eGFR: 91 mL/min/{1.73_m2} (ref >59)

## 2022-09-05 LAB — HEMOGLOBIN A1C
Est. average glucose Bld gHb Est-mCnc: 108 mg/dL
Hgb A1c MFr Bld: 5.4 % (ref 4.8–5.6)

## 2022-09-05 LAB — IRON,TIBC AND FERRITIN PANEL
Ferritin: 430 ng/mL — ABNORMAL HIGH (ref 30–400)
Iron Saturation: 26 % (ref 15–55)
Iron: 62 ug/dL (ref 38–169)
Total Iron Binding Capacity: 236 ug/dL — ABNORMAL LOW (ref 250–450)
UIBC: 174 ug/dL (ref 111–343)

## 2022-09-05 NOTE — Progress Notes (Signed)
Hi Mr. [name].  It was nice to see you yesterday.  Your lab work looks good.  Your anemia is slightly lower than prior.  You should continue the iron supplement.  Your A1c is well controlled at 5.4.  No other concerns at this time. Continue with your current medication regimen.  Follow up as discussed.  Please let me know if you have any questions.

## 2022-10-03 DIAGNOSIS — E1142 Type 2 diabetes mellitus with diabetic polyneuropathy: Secondary | ICD-10-CM | POA: Diagnosis not present

## 2022-10-03 DIAGNOSIS — Z89432 Acquired absence of left foot: Secondary | ICD-10-CM | POA: Diagnosis not present

## 2022-10-07 DIAGNOSIS — Z1211 Encounter for screening for malignant neoplasm of colon: Secondary | ICD-10-CM | POA: Diagnosis not present

## 2022-10-08 ENCOUNTER — Encounter (INDEPENDENT_AMBULATORY_CARE_PROVIDER_SITE_OTHER): Payer: Self-pay

## 2022-10-09 DIAGNOSIS — E1140 Type 2 diabetes mellitus with diabetic neuropathy, unspecified: Secondary | ICD-10-CM | POA: Diagnosis not present

## 2022-10-09 DIAGNOSIS — Z89432 Acquired absence of left foot: Secondary | ICD-10-CM | POA: Diagnosis not present

## 2022-10-14 LAB — COLOGUARD: COLOGUARD: NEGATIVE

## 2022-10-15 NOTE — Progress Notes (Signed)
HI [name].  Your cologuard test was negative.  We will repeat it in 3 years.

## 2022-11-11 ENCOUNTER — Other Ambulatory Visit: Payer: Self-pay | Admitting: Nurse Practitioner

## 2022-11-12 NOTE — Telephone Encounter (Signed)
Requested Prescriptions  Pending Prescriptions Disp Refills   glipiZIDE (GLUCOTROL) 5 MG tablet [Pharmacy Med Name: glipiZIDE 5 MG Oral Tablet] 180 tablet 0    Sig: TAKE 1 TABLET BY MOUTH TWICE DAILY BEFORE A MEAL     Endocrinology:  Diabetes - Sulfonylureas Passed - 11/11/2022 10:14 AM      Passed - HBA1C is between 0 and 7.9 and within 180 days    HB A1C (BAYER DCA - WAIVED)  Date Value Ref Range Status  05/23/2020 7.3 (H) <7.0 % Final    Comment:                                          Diabetic Adult            <7.0                                       Healthy Adult        4.3 - 5.7                                                           (DCCT/NGSP) American Diabetes Association's Summary of Glycemic Recommendations for Adults with Diabetes: Hemoglobin A1c <7.0%. More stringent glycemic goals (A1c <6.0%) may further reduce complications at the cost of increased risk of hypoglycemia.    Hgb A1c MFr Bld  Date Value Ref Range Status  09/04/2022 5.4 4.8 - 5.6 % Final    Comment:             Prediabetes: 5.7 - 6.4          Diabetes: >6.4          Glycemic control for adults with diabetes: <7.0          Passed - Cr in normal range and within 360 days    Creatinine, Ser  Date Value Ref Range Status  09/04/2022 0.96 0.76 - 1.27 mg/dL Final         Passed - Valid encounter within last 6 months    Recent Outpatient Visits           2 months ago Type 2 diabetes mellitus with diabetic neuropathy, with long-term current use of insulin ([place])   [facility] Jon Billings, NP   4 months ago Osteomyelitis of foot, left, acute ([place])   [place], Megan P, DO   5 months ago Ulcer of left foot, limited to breakdown of skin ([place])   [place], Avanti, MD   6 months ago Diabetic ulcer of left foot associated with type 2 diabetes mellitus, limited to breakdown of skin, unspecified part of foot ([place])   [facility]  Jon Billings, NP   7 months ago Type 2 diabetes mellitus with diabetic neuropathy, with long-term current use of insulin ([place])   [facility] Jon Billings, NP       Future Appointments             In 3 weeks Jon Billings, NP [facility]  metFORMIN (GLUCOPHAGE) 1000 MG tablet [Pharmacy Med Name: metFORMIN HCl 1000 MG Oral Tablet] 180 tablet 0    Sig: TAKE 1 TABLET BY MOUTH TWICE DAILY WITH A MEAL     Endocrinology:  Diabetes - Biguanides Passed - 11/11/2022 10:14 AM      Passed - Cr in normal range and within 360 days    Creatinine, Ser  Date Value Ref Range Status  09/04/2022 0.96 0.76 - 1.27 mg/dL Final         Passed - HBA1C is between 0 and 7.9 and within 180 days    HB A1C (BAYER DCA - WAIVED)  Date Value Ref Range Status  05/23/2020 7.3 (H) <7.0 % Final    Comment:                                          Diabetic Adult            <7.0                                       Healthy Adult        4.3 - 5.7                                                           (DCCT/NGSP) American Diabetes Association's Summary of Glycemic Recommendations for Adults with Diabetes: Hemoglobin A1c <7.0%. More stringent glycemic goals (A1c <6.0%) may further reduce complications at the cost of increased risk of hypoglycemia.    Hgb A1c MFr Bld  Date Value Ref Range Status  09/04/2022 5.4 4.8 - 5.6 % Final    Comment:             Prediabetes: 5.7 - 6.4          Diabetes: >6.4          Glycemic control for adults with diabetes: <7.0          Passed - eGFR in normal range and within 360 days    GFR calc Af Amer  Date Value Ref Range Status  11/17/2020 118 >59 mL/min/1.73 Final    Comment:    **In accordance with recommendations from the NKF-ASN Task force,**   [facility] is in the process of updating its eGFR calculation to the   2021 CKD-EPI creatinine equation that estimates kidney function   without a race variable.     GFR, Estimated  Date Value Ref Range Status  06/26/2022 >60 >60 mL/min Final    Comment:    (NOTE) Calculated using the CKD-EPI Creatinine Equation (2021)    eGFR  Date Value Ref Range Status  09/04/2022 91 >59 mL/min/1.73 Final         Passed - B12 Level in normal range and within 720 days    Vitamin B-12  Date Value Ref Range Status  04/20/2022 238 232 - 1,245 pg/mL Final         Passed - Valid encounter within last 6 months    Recent Outpatient Visits           2 months ago Type 2 diabetes mellitus with  diabetic neuropathy, with long-term current use of insulin ([place])   [facility] Jon Billings, NP   4 months ago Osteomyelitis of foot, left, acute ([place])   [place], Megan P, DO   5 months ago Ulcer of left foot, limited to breakdown of skin ([place])   [place], Avanti, MD   6 months ago Diabetic ulcer of left foot associated with type 2 diabetes mellitus, limited to breakdown of skin, unspecified part of foot ([place])   [facility] Jon Billings, NP   7 months ago Type 2 diabetes mellitus with diabetic neuropathy, with long-term current use of insulin ([place])   [facility] Jon Billings, NP       Future Appointments             In 3 weeks Jon Billings, NP [place]            Passed - CBC within normal limits and completed in the last 12 months    WBC  Date Value Ref Range Status  09/04/2022 6.3 3.4 - 10.8 x10E3/uL Final  06/25/2022 9.3 4.0 - 10.5 K/uL Final   RBC  Date Value Ref Range Status  09/04/2022 3.74 (L) 4.14 - 5.80 x10E6/uL Final  06/25/2022 3.67 (L) 4.22 - 5.81 MIL/uL Final   Hemoglobin  Date Value Ref Range Status  09/04/2022 11.6 (L) 13.0 - 17.7 g/dL Final   Hematocrit  Date Value Ref Range Status  09/04/2022 35.1 (L) 37.5 - 51.0 % Final   MCHC  Date Value Ref Range Status  09/04/2022 33.0 31.5 - 35.7 g/dL Final   06/25/2022 33.3 30.0 - 36.0 g/dL Final   [facility]  Date Value Ref Range Status  09/04/2022 31.0 26.6 - 33.0 pg Final  06/25/2022 30.0 26.0 - 34.0 pg Final   MCV  Date Value Ref Range Status  09/04/2022 94 79 - 97 fL Final   No results found for: "PLTCOUNTKUC", "LABPLAT", "POCPLA" RDW  Date Value Ref Range Status  09/04/2022 13.7 11.6 - 15.4 % Final

## 2022-12-04 ENCOUNTER — Encounter: Payer: Self-pay | Admitting: Nurse Practitioner

## 2022-12-04 ENCOUNTER — Ambulatory Visit (INDEPENDENT_AMBULATORY_CARE_PROVIDER_SITE_OTHER): Payer: BC Managed Care – PPO | Admitting: Nurse Practitioner

## 2022-12-04 VITALS — BP 125/70 | HR 90 | Temp 99.2°F | Ht 70.0 in | Wt 241.2 lb

## 2022-12-04 DIAGNOSIS — E1169 Type 2 diabetes mellitus with other specified complication: Secondary | ICD-10-CM | POA: Diagnosis not present

## 2022-12-04 DIAGNOSIS — Z6832 Body mass index (BMI) 32.0-32.9, adult: Secondary | ICD-10-CM

## 2022-12-04 DIAGNOSIS — E6609 Other obesity due to excess calories: Secondary | ICD-10-CM

## 2022-12-04 DIAGNOSIS — E1140 Type 2 diabetes mellitus with diabetic neuropathy, unspecified: Secondary | ICD-10-CM

## 2022-12-04 DIAGNOSIS — E785 Hyperlipidemia, unspecified: Secondary | ICD-10-CM

## 2022-12-04 DIAGNOSIS — D509 Iron deficiency anemia, unspecified: Secondary | ICD-10-CM

## 2022-12-04 DIAGNOSIS — I10 Essential (primary) hypertension: Secondary | ICD-10-CM

## 2022-12-04 DIAGNOSIS — I739 Peripheral vascular disease, unspecified: Secondary | ICD-10-CM

## 2022-12-04 DIAGNOSIS — Z794 Long term (current) use of insulin: Secondary | ICD-10-CM

## 2022-12-04 DIAGNOSIS — Z0000 Encounter for general adult medical examination without abnormal findings: Secondary | ICD-10-CM

## 2022-12-04 MED ORDER — GLIPIZIDE 5 MG PO TABS: 5.0000 mg | ORAL_TABLET | Freq: Two times a day (BID) | ORAL | 1 refills | Status: DC

## 2022-12-04 MED ORDER — METFORMIN HCL 1000 MG PO TABS: 1000.0000 mg | ORAL_TABLET | Freq: Two times a day (BID) | ORAL | 1 refills | Status: DC

## 2022-12-04 MED ORDER — LISINOPRIL 10 MG PO TABS: 10.0000 mg | ORAL_TABLET | Freq: Every day | ORAL | 1 refills | Status: DC

## 2022-12-04 MED ORDER — ATORVASTATIN CALCIUM 20 MG PO TABS: 20.0000 mg | ORAL_TABLET | ORAL | 1 refills | Status: DC

## 2022-12-04 NOTE — Assessment & Plan Note (Signed)
Recommended eating smaller high protein, low fat meals more frequently and exercising 30 mins a day 5 times a week with a goal of 10-15lb weight loss in the next 3 months.  

## 2022-12-04 NOTE — Assessment & Plan Note (Signed)
Chronic.  Controlled.  Continue with current medication regimen of Atorvastatin 20mg daily.  Labs ordered today.  Return to clinic in 6 months for reevaluation.  Call sooner if concerns arise.   

## 2022-12-04 NOTE — Assessment & Plan Note (Signed)
Chronic.  Labs ordered at visit today.  Will make recommendations based on lab results.  Has been taking Iron supplement.  Will advise once labs are returned. Follow up in 6 months. Call sooner if concerns arise.

## 2022-12-04 NOTE — Assessment & Plan Note (Signed)
Chronic.  Controlled.  Last A1c was 6.4.  Sugars are running 90-140 daily.  Continue with current medication regimen of Glipizide and Metformin.  Labs ordered today.  Return to clinic in 3 months for reevaluation.  Call sooner if concerns arise.

## 2022-12-04 NOTE — Progress Notes (Signed)
BP 125/70   Pulse 90   Temp 99.2 F (37.3 C) (Oral)   Ht _0  (1.778 m)   Wt 241 lb 3.2 oz (109.4 kg)   SpO2 98%   BMI 34.61 kg/m    Subjective:    Patient ID: [name], male    DOB: 1961-06-30, 61 y.o.   MRN: [id number]  HPI: [name] is a 61 y.o. male presenting on 12/04/2022 for comprehensive medical examination. Current medical complaints include:none  He currently lives with: Interim Problems from his last visit: no  HYPERTENSION / HYPERLIPIDEMIA Satisfied with current treatment? yes Duration of hypertension: years BP monitoring frequency: not checking BP range:  BP medication side effects: no Past BP meds: lisinopril Duration of hyperlipidemia: years Cholesterol medication side effects: no Cholesterol supplements: none Past cholesterol medications: atorvastain (lipitor) Medication compliance: excellent compliance Aspirin: no Recent stressors: no Recurrent headaches: no Visual changes: no Palpitations: no Dyspnea: no Chest pain: no Lower extremity edema: no Dizzy/lightheaded: no  DIABETES Hypoglycemic episodes:no Polydipsia/polyuria: no Visual disturbance: no Chest pain: no Paresthesias: no Glucose Monitoring: yes  Accucheck frequency: Daily  Fasting glucose: 140-150  Post prandial:  Evening:  Before meals: Taking Insulin?: no  Long acting insulin:  Short acting insulin: Blood Pressure Monitoring: not checking Retinal Examination: Not up to Date Foot Exam: Up to Date Diabetic Education: Not Completed Pneumovax: Up to Date Influenza: Not up to date Aspirin: no  ANEMIA Anemia status: controlled Etiology of anemia: CKD Duration of anemia treatment:  Compliance with treatment: excellent compliance Iron supplementation side effects: no Severity of anemia: mild Fatigue: no Decreased exercise tolerance: no  Dyspnea on exertion: no Palpitations: no Bleeding: no Pica: no  Depression Screen done today and results listed below:      12/04/2022    3:23 PM 09/04/2022    3:35 PM 07/06/2022    2:42 PM 06/06/2022    3:08 PM 03/30/2022   10:48 AM  Depression screen PHQ 2/9  Decreased Interest 0 0 0 0 0  Down, Depressed, Hopeless 0 0 0 0 0  PHQ - 2 Score 0 0 0 0 0  Altered sleeping 0 0 0 0 0  Tired, decreased energy 0 0 0 0 0  Change in appetite 0 0 0 0 0  Feeling bad or failure about yourself  0 0 0 0 0  Trouble concentrating 0 0 0 0 0  Moving slowly or fidgety/restless 0 0 0 0 0  Suicidal thoughts 0 0 0 0 0  PHQ-9 Score 0 0 0 0 0  Difficult doing work/chores Not difficult at all Not difficult at all  Not difficult at all Not difficult at all    The patient does not have a history of falls. I did complete a risk assessment for falls. A plan of care for falls was documented.   Past Medical History:  Past Medical History:  Diagnosis Date   Arthritis    hands   COVID-19 12/29/2019   Diabetes mellitus without complication ([place])    type 2   GERD (gastroesophageal reflux disease)    Hypercholesterolemia    Osteomyelitis of left foot [facility])     Surgical History:  Past Surgical History:  Procedure Laterality Date   ADENOIDECTOMY     AMPUTATION Right 10/07/2020   Procedure: AMPUTATION RAY-Right [place];  Surgeon: Caroline More, DPM;  Location: [facility];  Service: Podiatry;  Laterality: Right;   AMPUTATION Left 01/07/2021   Procedure: AMPUTATION RAY - Left 1st and  2nd Ray;  Surgeon: Caroline More, DPM;  Location: [facility];  Service: Podiatry;  Laterality: Left;   AMPUTATION TOE     CATARACT EXTRACTION W/PHACO Right 03/03/2020   Procedure: CATARACT EXTRACTION PHACO AND INTRAOCULAR LENS PLACEMENT ([place]) RIGHT DIABETIC VISION BLUE;  Surgeon: Marchia Meiers, MD;  Location: [place];  Service: Ophthalmology;  Laterality: Right;  COVID ( + ) 12-29-2019  30.97 02:27.7   CATARACT EXTRACTION W/PHACO Left 06/23/2020   Procedure: CATARACT EXTRACTION PHACO AND INTRAOCULAR LENS PLACEMENT ([facility]) LEFT DIABETIC VISION  BLUE 5.14  00:34.7;  Surgeon: Marchia Meiers, MD;  Location: [place];  Service: Ophthalmology;  Laterality: Left;  DIABETIC   ELBOW FRACTURE SURGERY Right    EXTERNAL EAR SURGERY     IRRIGATION AND DEBRIDEMENT ABSCESS Left 06/21/2022   Procedure: IRRIGATION AND DEBRIDEMENT ABSCESS;  Surgeon: Sharlotte Alamo, DPM;  Location: [facility];  Service: Podiatry;  Laterality: Left;   IRRIGATION AND DEBRIDEMENT FOOT Right 10/07/2020   Procedure: IRRIGATION AND DEBRIDEMENT FOOT;  Surgeon: Caroline More, DPM;  Location: [facility];  Service: Podiatry;  Laterality: Right;   TRANSMETATARSAL AMPUTATION Left 06/21/2022   Procedure: TRANSMETATARSAL AMPUTATION-1st and 2nd;  Surgeon: Sharlotte Alamo, DPM;  Location: [facility];  Service: Podiatry;  Laterality: Left;    Medications:  Current Outpatient Medications on File Prior to Visit  Medication Sig   Ascorbic Acid (VITAMIN C CR) 1500 MG TBCR Take 1,500 mg by mouth daily.   aspirin EC 325 MG tablet Take 325 mg by mouth daily.   B Complex Vitamins (B COMPLEX 1 PO) Take 1 tablet by mouth daily.   BD PEN NEEDLE MICRO U/F 32G X 6 MM MISC USE 1  ONCE DAILY AS NEEDED   Cinnamon 500 MG capsule Take 500 mg by mouth daily.   Garlic 0086 MG CAPS Take 1,000 mg by mouth daily.   No current facility-administered medications on file prior to visit.    Allergies:  No Known Allergies  Social History:  Social History   Socioeconomic History   Marital status: Single    Spouse name: Not on file   Number of children: Not on file   Years of education: Not on file   Highest education level: Not on file  Occupational History   Not on file  Tobacco Use   Smoking status: Never   Smokeless tobacco: Never  Vaping Use   Vaping Use: Never used  Substance and Sexual Activity   Alcohol use: Not Currently    Comment: occasionally   Drug use: No   Sexual activity: Not Currently  Other Topics Concern   Not on file  Social History Narrative   Not on file   Social  Determinants of Health   Financial Resource Strain: Not on file  Food Insecurity: Not on file  Transportation Needs: Not on file  Physical Activity: Not on file  Stress: Not on file  Social Connections: Not on file  Intimate Partner Violence: Not on file   Social History   Tobacco Use  Smoking Status Never  Smokeless Tobacco Never   Social History   Substance and Sexual Activity  Alcohol Use Not Currently   Comment: occasionally    Family History:  Family History  Problem Relation Age of Onset   Dementia Mother    Hypertension Mother    Cancer Brother        Cancer   Diabetes Maternal Grandmother    Hypertension Maternal Grandmother    COPD Neg Hx  Heart disease Neg Hx    Stroke Neg Hx     Past medical history, surgical history, medications, allergies, family history and social history reviewed with patient today and changes made to appropriate areas of the chart.   Review of Systems  HENT:         Denies vision changes.  Eyes:  Negative for blurred vision and double vision.  Respiratory:  Negative for shortness of breath.   Cardiovascular:  Negative for chest pain, palpitations and leg swelling.  Neurological:  Negative for dizziness, tingling and headaches.  Endo/Heme/Allergies:  Negative for polydipsia.       Denies Polyuria   All other ROS negative except what is listed above and in the HPI.      Objective:    BP 125/70   Pulse 90   Temp 99.2 F (37.3 C) (Oral)   Ht _0  (1.778 m)   Wt 241 lb 3.2 oz (109.4 kg)   SpO2 98%   BMI 34.61 kg/m   Wt Readings from Last 3 Encounters:  12/04/22 241 lb 3.2 oz (109.4 kg)  09/04/22 229 lb 11.2 oz (104.2 kg)  07/06/22 227 lb (103 kg)    Physical Exam Vitals and nursing note reviewed.  Constitutional:      General: He is not in acute distress.    Appearance: Normal appearance. He is not ill-appearing, toxic-appearing or diaphoretic.  HENT:     Head: Normocephalic.     Right Ear: Tympanic membrane,  ear canal and external ear normal.     Left Ear: Tympanic membrane, ear canal and external ear normal.     Nose: Nose normal. No congestion or rhinorrhea.     Mouth/Throat:     Mouth: Mucous membranes are moist.  Eyes:     General:        Right eye: No discharge.        Left eye: No discharge.     Extraocular Movements: Extraocular movements intact.     Conjunctiva/sclera: Conjunctivae normal.     Pupils: Pupils are equal, round, and reactive to light.  Cardiovascular:     Rate and Rhythm: Normal rate and regular rhythm.     Heart sounds: No murmur heard. Pulmonary:     Effort: Pulmonary effort is normal. No respiratory distress.     Breath sounds: Normal breath sounds. No wheezing, rhonchi or rales.  Abdominal:     General: Abdomen is flat. Bowel sounds are normal. There is no distension.     Palpations: Abdomen is soft.     Tenderness: There is no abdominal tenderness. There is no guarding.  Musculoskeletal:     Cervical back: Normal range of motion and neck supple.  Skin:    General: Skin is warm and dry.     Capillary Refill: Capillary refill takes less than 2 seconds.  Neurological:     General: No focal deficit present.     Mental Status: He is alert and oriented to person, place, and time.     Cranial Nerves: No cranial nerve deficit.     Motor: No weakness.     Deep Tendon Reflexes: Reflexes normal.  Psychiatric:        Mood and Affect: Mood normal.        Behavior: Behavior normal.        Thought Content: Thought content normal.        Judgment: Judgment normal.     Results for orders placed or performed in visit  on 09/04/22  Comp Met (CMET)  Result Value Ref Range   Glucose 128 (H) 70 - 99 mg/dL   BUN 19 6 - 24 mg/dL   Creatinine, Ser 0.96 0.76 - 1.27 mg/dL   eGFR 91 >59 mL/min/1.73   BUN/Creatinine Ratio 20 9 - 20   Sodium 138 134 - 144 mmol/L   Potassium 4.2 3.5 - 5.2 mmol/L   Chloride 102 96 - 106 mmol/L   CO2 23 20 - 29 mmol/L   Calcium 9.0 8.7 -  10.2 mg/dL   Total Protein 6.5 6.0 - 8.5 g/dL   Albumin 4.2 3.8 - 4.9 g/dL   Globulin, Total 2.3 1.5 - 4.5 g/dL   Albumin/Globulin Ratio 1.8 1.2 - 2.2   Bilirubin Total 0.3 0.0 - 1.2 mg/dL   Alkaline Phosphatase 58 44 - 121 IU/L   AST 22 0 - 40 IU/L   ALT 21 0 - 44 IU/L  HgB A1c  Result Value Ref Range   Hgb A1c MFr Bld 5.4 4.8 - 5.6 %   Est. average glucose Bld gHb Est-mCnc 108 mg/dL  Cologuard  Result Value Ref Range   COLOGUARD Negative Negative  CBC w/Diff  Result Value Ref Range   WBC 6.3 3.4 - 10.8 x10E3/uL   RBC 3.74 (L) 4.14 - 5.80 x10E6/uL   Hemoglobin 11.6 (L) 13.0 - 17.7 g/dL   Hematocrit 35.1 (L) 37.5 - 51.0 %   MCV 94 79 - 97 fL   MCH 31.0 26.6 - 33.0 pg   MCHC 33.0 31.5 - 35.7 g/dL   RDW 13.7 11.6 - 15.4 %   Platelets 205 150 - 450 x10E3/uL   Neutrophils 54 Not Estab. %   Lymphs 32 Not Estab. %   Monocytes 8 Not Estab. %   Eos 4 Not Estab. %   Basos 1 Not Estab. %   Neutrophils Absolute 3.5 1.4 - 7.0 x10E3/uL   Lymphocytes Absolute 2.1 0.7 - 3.1 x10E3/uL   Monocytes Absolute 0.5 0.1 - 0.9 x10E3/uL   EOS (ABSOLUTE) 0.2 0.0 - 0.4 x10E3/uL   Basophils Absolute 0.0 0.0 - 0.2 x10E3/uL   Immature Granulocytes 1 Not Estab. %   Immature Grans (Abs) 0.0 0.0 - 0.1 x10E3/uL  Iron, TIBC and Ferritin Panel  Result Value Ref Range   Total Iron Binding Capacity 236 (L) 250 - 450 ug/dL   UIBC 174 111 - 343 ug/dL   Iron 62 38 - 169 ug/dL   Iron Saturation 26 15 - 55 %   Ferritin 430 (H) 30 - 400 ng/mL      Assessment & Plan:   Problem List Items Addressed This Visit       Cardiovascular and Mediastinum   PAD (peripheral artery disease) (HCC)    Chronic, followed by podiatry at this time.  Will keep BP and cholesterol + sugars under good control. Continue aspirin. Consider vascular referral in future.  Labs ordered today.  Return in 6 months.        Relevant Medications   atorvastatin (LIPITOR) 20 MG tablet   lisinopril (ZESTRIL) 10 MG tablet   Essential  hypertension    Chronic.  Controlled.  Will change to Lisinopril 31m due to the patient having diabetes.  Labs ordered today.  Return to clinic in 6 months for reevaluation.  Call sooner if concerns arise.        Relevant Medications   atorvastatin (LIPITOR) 20 MG tablet   lisinopril (ZESTRIL) 10 MG tablet  Endocrine   Hyperlipidemia associated with type 2 diabetes mellitus ([facility])    Chronic.  Controlled.  Continue with current medication regimen of Atorvastatin 45m daily.  Labs ordered today.  Return to clinic in 6 months for reevaluation.  Call sooner if concerns arise.        Relevant Medications   atorvastatin (LIPITOR) 20 MG tablet   glipiZIDE (GLUCOTROL) 5 MG tablet   lisinopril (ZESTRIL) 10 MG tablet   metFORMIN (GLUCOPHAGE) 1000 MG tablet   Other Relevant Orders   Lipid panel   Type 2 diabetes mellitus with diabetic neuropathy, with long-term current use of insulin (HCC)    Chronic.  Controlled.  Last A1c was 6.4.  Sugars are running 90-140 daily.  Continue with current medication regimen of Glipizide and Metformin.  Labs ordered today.  Return to clinic in 3 months for reevaluation.  Call sooner if concerns arise.        Relevant Medications   atorvastatin (LIPITOR) 20 MG tablet   glipiZIDE (GLUCOTROL) 5 MG tablet   lisinopril (ZESTRIL) 10 MG tablet   metFORMIN (GLUCOPHAGE) 1000 MG tablet   Other Relevant Orders   HgB A1c   Microalbumin, Urine Waived     Other   Obesity    Recommended eating smaller high protein, low fat meals more frequently and exercising 30 mins a day 5 times a week with a goal of 10-15lb weight loss in the next 3 months.       Relevant Medications   glipiZIDE (GLUCOTROL) 5 MG tablet   metFORMIN (GLUCOPHAGE) 1000 MG tablet   Iron deficiency anemia    Chronic.  Labs ordered at visit today.  Will make recommendations based on lab results.  Has been taking Iron supplement.  Will advise once labs are returned. Follow up in 6 months. Call  sooner if concerns arise.       Other Visit Diagnoses     Annual physical exam    -  Primary   Health maintenance reviewed during visit today.  Labs ordered. Declined flu shot. Colon cancer screen up to day.   Relevant Orders   TSH   PSA   Lipid panel   CBC with Differential/Platelet   Comprehensive metabolic panel   Urinalysis, Routine w reflex microscopic        Discussed aspirin prophylaxis for myocardial infarction prevention and decision was it was not indicated  LABORATORY TESTING:  Health maintenance labs ordered today as discussed above.   The natural history of prostate cancer and ongoing controversy regarding screening and potential treatment outcomes of prostate cancer has been discussed with the patient. The meaning of a false positive PSA and a false negative PSA has been discussed. He indicates understanding of the limitations of this screening test and wishes to proceed with screening PSA testing.   IMMUNIZATIONS:   - Tdap: Tetanus vaccination status reviewed: last tetanus booster within 10 years. - Influenza: Refused - Pneumovax: Up to date - Prevnar: Not applicable - COVID: Not applicable - HPV: Not applicable - Shingrix vaccine:  Discussed at visit today  SCREENING: - Colonoscopy: Up to date  Discussed with patient purpose of the colonoscopy is to detect colon cancer at curable precancerous or early stages   - AAA Screening: Not applicable  -Hearing Test: Not applicable  -Spirometry: Not applicable   PATIENT COUNSELING:    Sexuality: Discussed sexually transmitted diseases, partner selection, use of condoms, avoidance of unintended pregnancy  and contraceptive alternatives.   Advised to avoid cigarette  smoking.  I discussed with the patient that most people either abstain from alcohol or drink within safe limits (<=14/week and <=4 drinks/occasion for males, <=7/weeks and <= 3 drinks/occasion for females) and that the risk for alcohol disorders and  other health effects rises proportionally with the number of drinks per week and how often a drinker exceeds daily limits.  Discussed cessation/primary prevention of drug use and availability of treatment for abuse.   Diet: Encouraged to adjust caloric intake to maintain  or achieve ideal body weight, to reduce intake of dietary saturated fat and total fat, to limit sodium intake by avoiding high sodium foods and not adding table salt, and to maintain adequate dietary potassium and calcium preferably from fresh fruits, vegetables, and low-fat dairy products.    stressed the importance of regular exercise  Injury prevention: Discussed safety belts, safety helmets, smoke detector, smoking near bedding or upholstery.   Dental health: Discussed importance of regular tooth brushing, flossing, and dental visits.   Follow up plan: NEXT PREVENTATIVE PHYSICAL DUE IN 1 YEAR. Return in about 6 months (around 06/05/2023) for HTN, HLD, DM2 FU.

## 2022-12-04 NOTE — Assessment & Plan Note (Addendum)
Chronic, followed by podiatry at this time.  Will keep BP and cholesterol + sugars under good control. Continue aspirin. Consider vascular referral in future.  Labs ordered today.  Return in 6 months.

## 2022-12-04 NOTE — Assessment & Plan Note (Signed)
Chronic.  Controlled.  Will change to Lisinopril 10mg  due to the patient having diabetes.  Labs ordered today.  Return to clinic in 6 months for reevaluation.  Call sooner if concerns arise.

## 2022-12-04 NOTE — Progress Notes (Deleted)
There were no vitals taken for this visit.   Subjective:    Patient ID: [name], male    DOB: [birth year], 61 y.o.   MRN: [id number]  HPI: [name] is a 61 y.o. male  No chief complaint on file.  HYPERTENSION / HYPERLIPIDEMIA Satisfied with current treatment? {Blank single:19197::"yes","no"} Duration of hypertension: {Blank single:19197::"chronic","months","years"} BP monitoring frequency: {Blank single:19197::"not checking","rarely","daily","weekly","monthly","a few times a day","a few times a week","a few times a month"} BP range:  BP medication side effects: {Blank single:19197::"yes","no"} Past BP meds: {Blank EXBMWUXL:24401::"UUVO","ZDGUYQIHKV","QQVZDGLOVF/IEPPIRJJOA","CZYSAYTK","ZSWFUXNATF","TDDUKGURKY/HCWC","BJSEGBTDVV (bystolic)","carvedilol","chlorthalidone","clonidine","diltiazem","exforge HCT","HCTZ","irbesartan (avapro)","labetalol","lisinopril","lisinopril-HCTZ","losartan (cozaar)","methyldopa","nifedipine","olmesartan (benicar)","olmesartan-HCTZ","quinapril","ramipril","spironalactone","tekturna","valsartan","valsartan-HCTZ","verapamil"} Duration of hyperlipidemia: {Blank single:19197::"chronic","months","years"} Cholesterol medication side effects: {Blank single:19197::"yes","no"} Cholesterol supplements: {Blank multiple:19196::"none","fish oil","niacin","red yeast rice"} Past cholesterol medications: {Blank multiple:19196::"none","atorvastain (lipitor)","lovastatin (mevacor)","pravastatin (pravachol)","rosuvastatin (crestor)","simvastatin (zocor)","vytorin","fenofibrate (tricor)","gemfibrozil","ezetimide (zetia)","niaspan","lovaza"} Medication compliance: {Blank single:19197::"excellent compliance","good compliance","fair compliance","poor compliance"} Aspirin: {Blank single:19197::"yes","no"} Recent stressors: {Blank single:19197::"yes","no"} Recurrent headaches: {Blank single:19197::"yes","no"} Visual changes: {Blank single:19197::"yes","no"} Palpitations:  {Blank single:19197::"yes","no"} Dyspnea: {Blank single:19197::"yes","no"} Chest pain: {Blank single:19197::"yes","no"} Lower extremity edema: {Blank single:19197::"yes","no"} Dizzy/lightheaded: {Blank single:19197::"yes","no"}  DIABETES Hypoglycemic episodes:{Blank single:19197::"yes","no"} Polydipsia/polyuria: {Blank single:19197::"yes","no"} Visual disturbance: {Blank single:19197::"yes","no"} Chest pain: {Blank single:19197::"yes","no"} Paresthesias: {Blank single:19197::"yes","no"} Glucose Monitoring: {Blank single:19197::"yes","no"}  Accucheck frequency: {Blank single:19197::"Not Checking","Daily","BID","TID"}  Fasting glucose:  Post prandial:  Evening:  Before meals: Taking Insulin?: {Blank single:19197::"yes","no"}  Long acting insulin:  Short acting insulin: Blood Pressure Monitoring: {Blank single:19197::"not checking","rarely","daily","weekly","monthly","a few times a day","a few times a week","a few times a month"} Retinal Examination: {Blank single:19197::"Up to Date","Not up to Date"} Foot Exam: {Blank single:19197::"Up to Date","Not up to Date"} Diabetic Education: {Blank single:19197::"Completed","Not Completed"} Pneumovax: {Blank single:19197::"Up to Date","Not up to Date","unknown"} Influenza: {Blank single:19197::"Up to Date","Not up to Date","unknown"} Aspirin: {Blank single:19197::"yes","no"}  ANEMIA Anemia status: {Blank single:19197::"controlled","uncontrolled","better","worse","exacerbated","stable"} Etiology of anemia: Duration of anemia treatment:  Compliance with treatment: {Blank single:19197::"excellent compliance","good compliance","fair compliance","poor compliance"} Iron supplementation side effects: {Blank single:19197::"yes","no"} Severity of anemia: {Blank single:19197::"mild","moderate","severe"} Fatigue: {Blank single:19197::"yes","no"} Decreased exercise tolerance: {Blank single:19197::"yes","no"}  Dyspnea on exertion: {Blank  single:19197::"yes","no"} Palpitations: {Blank single:19197::"yes","no"} Bleeding: {Blank single:19197::"yes","no"} Pica: {Blank single:19197::"yes","no"}  Relevant past medical, surgical, family and social history reviewed and updated as indicated. Interim medical history since our last visit reviewed. Allergies and medications reviewed and updated.  Review of Systems  Per HPI unless specifically indicated above     Objective:    There were no vitals taken for this visit.  Wt Readings from Last 3 Encounters:  09/04/22 229 lb 11.2 oz (104.2 kg)  07/06/22 227 lb (103 kg)  07/02/22 226 lb 10.1 oz (102.8 kg)    Physical Exam  Results for orders placed or performed in visit on 09/04/22  Comp Met (CMET)  Result Value Ref Range   Glucose 128 (H) 70 - 99 mg/dL   BUN 19 6 - 24 mg/dL   Creatinine, Ser 0.96 0.76 - 1.27 mg/dL   eGFR 91 >59 mL/min/1.73   BUN/Creatinine Ratio 20 9 - 20   Sodium 138 134 - 144 mmol/L   Potassium 4.2 3.5 - 5.2 mmol/L   Chloride 102 96 - 106 mmol/L   CO2 23 20 - 29 mmol/L   Calcium 9.0 8.7 - 10.2 mg/dL   Total Protein 6.5 6.0 - 8.5 g/dL   Albumin 4.2 3.8 - 4.9 g/dL   Globulin, Total 2.3 1.5 - 4.5 g/dL   Albumin/Globulin Ratio 1.8 1.2 - 2.2   Bilirubin Total 0.3 0.0 - 1.2 mg/dL   Alkaline Phosphatase 58 44 - 121 IU/L   AST 22 0 - 40 IU/L   ALT 21 0 - 44 IU/L  HgB A1c  Result Value Ref Range   Hgb A1c MFr Bld 5.4 4.8 - 5.6 %   Est. average glucose Bld gHb Est-mCnc 108 mg/dL  Cologuard  Result Value Ref Range   COLOGUARD Negative Negative  CBC w/Diff  Result Value Ref Range   WBC 6.3 3.4 - 10.8 x10E3/uL   RBC 3.74 (L) 4.14 - 5.80 x10E6/uL   Hemoglobin 11.6 (L) 13.0 - 17.7 g/dL   Hematocrit 35.1 (L) 37.5 - 51.0 %   MCV 94 79 - 97 fL   MCH 31.0 26.6 - 33.0 pg   MCHC 33.0 31.5 - 35.7 g/dL   RDW 13.7 11.6 - 15.4 %   Platelets 205 150 - 450 x10E3/uL   Neutrophils 54 Not Estab. %   Lymphs 32 Not Estab. %   Monocytes 8 Not Estab. %   Eos 4 Not  Estab. %   Basos 1 Not Estab. %   Neutrophils Absolute 3.5 1.4 - 7.0 x10E3/uL   Lymphocytes Absolute 2.1 0.7 - 3.1 x10E3/uL   Monocytes Absolute 0.5 0.1 - 0.9 x10E3/uL   EOS (ABSOLUTE) 0.2 0.0 - 0.4 x10E3/uL   Basophils Absolute 0.0 0.0 - 0.2 x10E3/uL   Immature Granulocytes 1 Not Estab. %   Immature Grans (Abs) 0.0 0.0 - 0.1 x10E3/uL  Iron, TIBC and Ferritin Panel  Result Value Ref Range   Total Iron Binding Capacity 236 (L) 250 - 450 ug/dL   UIBC 174 111 - 343 ug/dL   Iron 62 38 - 169 ug/dL   Iron Saturation 26 15 - 55 %   Ferritin 430 (H) 30 - 400 ng/mL      Assessment & Plan:   Problem List Items Addressed This Visit       Cardiovascular and Mediastinum   PAD (peripheral artery disease) (HCC)   Essential hypertension - Primary     Endocrine   Hyperlipidemia associated with type 2 diabetes mellitus ([place])   Type 2 diabetes mellitus with diabetic neuropathy, with long-term current use of insulin (HCC)     Other   Obesity   Iron deficiency anemia     Follow up plan: No follow-ups on file.

## 2022-12-05 DIAGNOSIS — B351 Tinea unguium: Secondary | ICD-10-CM | POA: Diagnosis not present

## 2022-12-05 DIAGNOSIS — Z89432 Acquired absence of left foot: Secondary | ICD-10-CM | POA: Diagnosis not present

## 2022-12-05 DIAGNOSIS — E1142 Type 2 diabetes mellitus with diabetic polyneuropathy: Secondary | ICD-10-CM | POA: Diagnosis not present

## 2022-12-05 LAB — COMPREHENSIVE METABOLIC PANEL
ALT: 18 IU/L (ref 0–44)
AST: 19 IU/L (ref 0–40)
Albumin/Globulin Ratio: 1.5 (ref 1.2–2.2)
Albumin: 4 g/dL (ref 3.8–4.9)
Alkaline Phosphatase: 63 IU/L (ref 44–121)
BUN/Creatinine Ratio: 21 — ABNORMAL HIGH (ref 9–20)
BUN: 22 mg/dL (ref 6–24)
Bilirubin Total: 0.5 mg/dL (ref 0.0–1.2)
CO2: 23 mmol/L (ref 20–29)
Calcium: 8.8 mg/dL (ref 8.7–10.2)
Chloride: 102 mmol/L (ref 96–106)
Creatinine, Ser: 1.05 mg/dL (ref 0.76–1.27)
Globulin, Total: 2.7 g/dL (ref 1.5–4.5)
Glucose: 192 mg/dL — ABNORMAL HIGH (ref 70–99)
Potassium: 4.3 mmol/L (ref 3.5–5.2)
Sodium: 140 mmol/L (ref 134–144)
Total Protein: 6.7 g/dL (ref 6.0–8.5)
eGFR: 82 mL/min/{1.73_m2} (ref >59)

## 2022-12-05 LAB — PSA: Prostate Specific Ag, Serum: 3.9 ng/mL (ref 0.0–4.0)

## 2022-12-05 LAB — LIPID PANEL
Chol/HDL Ratio: 5.2 ratio — ABNORMAL HIGH (ref 0.0–5.0)
Cholesterol, Total: 173 mg/dL (ref 100–199)
HDL: 33 mg/dL — ABNORMAL LOW (ref >39)
LDL Chol Calc (NIH): 111 mg/dL — ABNORMAL HIGH (ref 0–99)
Triglycerides: 164 mg/dL — ABNORMAL HIGH (ref 0–149)
VLDL Cholesterol Cal: 29 mg/dL (ref 5–40)

## 2022-12-05 LAB — URINALYSIS, ROUTINE W REFLEX MICROSCOPIC
Bilirubin, UA: NEGATIVE
Glucose, UA: NEGATIVE
Ketones, UA: NEGATIVE
Leukocytes,UA: NEGATIVE
Nitrite, UA: NEGATIVE
Specific Gravity, UA: >1.03 — ABNORMAL HIGH (ref 1.005–1.030)
Urobilinogen, Ur: 0.2 mg/dL (ref 0.2–1.0)
pH, UA: 5 (ref 5.0–7.5)

## 2022-12-05 LAB — CBC WITH DIFFERENTIAL/PLATELET
Basophils Absolute: 0 10*3/uL (ref 0.0–0.2)
Basos: 1 %
EOS (ABSOLUTE): 0.2 10*3/uL (ref 0.0–0.4)
Eos: 3 %
Hematocrit: 38.4 % (ref 37.5–51.0)
Hemoglobin: 13 g/dL (ref 13.0–17.7)
Immature Grans (Abs): 0 10*3/uL (ref 0.0–0.1)
Immature Granulocytes: 1 %
Lymphocytes Absolute: 1.7 10*3/uL (ref 0.7–3.1)
Lymphs: 23 %
MCH: 31.3 pg (ref 26.6–33.0)
MCHC: 33.9 g/dL (ref 31.5–35.7)
MCV: 92 fL (ref 79–97)
Monocytes Absolute: 0.4 10*3/uL (ref 0.1–0.9)
Monocytes: 6 %
Neutrophils Absolute: 5 10*3/uL (ref 1.4–7.0)
Neutrophils: 66 %
Platelets: 219 10*3/uL (ref 150–450)
RBC: 4.16 x10E6/uL (ref 4.14–5.80)
RDW: 11.7 % (ref 11.6–15.4)
WBC: 7.4 10*3/uL (ref 3.4–10.8)

## 2022-12-05 LAB — TSH: TSH: 1.47 u[IU]/mL (ref 0.450–4.500)

## 2022-12-05 LAB — MICROSCOPIC EXAMINATION: Bacteria, UA: NONE SEEN

## 2022-12-05 LAB — HEMOGLOBIN A1C
Est. average glucose Bld gHb Est-mCnc: 134 mg/dL
Hgb A1c MFr Bld: 6.3 % — ABNORMAL HIGH (ref 4.8–5.6)

## 2022-12-05 LAB — MICROALBUMIN, URINE WAIVED
Creatinine, Urine Waived: 100 mg/dL (ref 10–300)
Microalb, Ur Waived: 150 mg/L — ABNORMAL HIGH (ref 0–19)
Microalb/Creat Ratio: >300 mg/g — ABNORMAL HIGH (ref <30)

## 2023-02-20 ENCOUNTER — Encounter: Payer: Self-pay | Admitting: Nurse Practitioner

## 2023-03-07 DIAGNOSIS — E1142 Type 2 diabetes mellitus with diabetic polyneuropathy: Secondary | ICD-10-CM | POA: Diagnosis not present

## 2023-03-07 DIAGNOSIS — Z89412 Acquired absence of left great toe: Secondary | ICD-10-CM | POA: Diagnosis not present

## 2023-03-07 DIAGNOSIS — Z89432 Acquired absence of left foot: Secondary | ICD-10-CM | POA: Diagnosis not present

## 2023-03-07 DIAGNOSIS — B351 Tinea unguium: Secondary | ICD-10-CM | POA: Diagnosis not present

## 2023-05-30 ENCOUNTER — Encounter: Payer: Self-pay | Admitting: Nurse Practitioner

## 2023-05-30 ENCOUNTER — Ambulatory Visit (INDEPENDENT_AMBULATORY_CARE_PROVIDER_SITE_OTHER): Payer: BC Managed Care – PPO | Admitting: Nurse Practitioner

## 2023-05-30 VITALS — BP 155/91 | HR 79 | Temp 98.6°F | Wt 255.0 lb

## 2023-05-30 DIAGNOSIS — I739 Peripheral vascular disease, unspecified: Secondary | ICD-10-CM

## 2023-05-30 DIAGNOSIS — E1169 Type 2 diabetes mellitus with other specified complication: Secondary | ICD-10-CM

## 2023-05-30 DIAGNOSIS — E1140 Type 2 diabetes mellitus with diabetic neuropathy, unspecified: Secondary | ICD-10-CM

## 2023-05-30 DIAGNOSIS — Z794 Long term (current) use of insulin: Secondary | ICD-10-CM

## 2023-05-30 DIAGNOSIS — D509 Iron deficiency anemia, unspecified: Secondary | ICD-10-CM

## 2023-05-30 DIAGNOSIS — I10 Essential (primary) hypertension: Secondary | ICD-10-CM | POA: Diagnosis not present

## 2023-05-30 DIAGNOSIS — E785 Hyperlipidemia, unspecified: Secondary | ICD-10-CM

## 2023-05-30 MED ORDER — ATORVASTATIN CALCIUM 20 MG PO TABS: 20.0000 mg | ORAL_TABLET | ORAL | 1 refills | Status: DC

## 2023-05-30 MED ORDER — METFORMIN HCL 1000 MG PO TABS: 1000.0000 mg | ORAL_TABLET | Freq: Two times a day (BID) | ORAL | 1 refills | Status: DC

## 2023-05-30 MED ORDER — DEXCOM G7 SENSOR MISC: 1.0000 [IU] | 1 refills | Status: DC

## 2023-05-30 MED ORDER — LISINOPRIL 20 MG PO TABS: 20.0000 mg | ORAL_TABLET | Freq: Every day | ORAL | 1 refills | Status: DC

## 2023-05-30 MED ORDER — GLIPIZIDE 5 MG PO TABS: 5.0000 mg | ORAL_TABLET | Freq: Two times a day (BID) | ORAL | 1 refills | Status: DC

## 2023-05-30 MED ORDER — DEXCOM G7 RECEIVER DEVI: 1.0000 [IU] | 0 refills | Status: DC

## 2023-05-30 NOTE — Assessment & Plan Note (Signed)
Chronic, followed by podiatry at this time.  Will keep BP and cholesterol + sugars under good control. Continue aspirin. Consider vascular referral in future, if needed.  Labs ordered today.  Return in 6 months.

## 2023-05-30 NOTE — Assessment & Plan Note (Signed)
Chronic.  Controlled.  Continue with current medication regimen of Atorvastatin 20mg daily.  Refills sent today.  Labs ordered today.  Return to clinic in 6 months for reevaluation.  Call sooner if concerns arise.  ? ?

## 2023-05-30 NOTE — Progress Notes (Signed)
BP (!) 155/91   Pulse 79   Temp 98.6 F (37 C) (Oral)   Wt 255 lb (115.7 kg)   SpO2 96%   BMI 36.59 kg/m    Subjective:    Patient ID: [name], male    DOB: 1961-05-03, 62 y.o.   MRN: [id number]  HPI: [name] is a 62 y.o. male  Chief Complaint  Patient presents with   Hypertension   Hyperlipidemia   Diabetes   HYPERTENSION / HYPERLIPIDEMIA Satisfied with current treatment? yes Duration of hypertension: years BP monitoring frequency: not checking BP range:  BP medication side effects: no Past BP meds: lisinopril Duration of hyperlipidemia: years Cholesterol medication side effects: no Cholesterol supplements: none Past cholesterol medications: atorvastain (lipitor) Medication compliance: excellent compliance Aspirin: no Recent stressors: no Recurrent headaches: no Visual changes: no Palpitations: no Dyspnea: no Chest pain: no Lower extremity edema: no Dizzy/lightheaded: no  DIABETES Last A1c 6.3%.   Hypoglycemic episodes:no Polydipsia/polyuria: no Visual disturbance: no Chest pain: no Paresthesias: no Glucose Monitoring: yes  Accucheck frequency: not checking  Fasting glucose:   Post prandial:  Evening:  Before meals: Taking Insulin?: no  Long acting insulin:  Short acting insulin: Blood Pressure Monitoring: not checking Retinal Examination: Not up to Date Foot Exam: Up to Date Diabetic Education: Not Completed Pneumovax: Up to Date Influenza: Not up to date Aspirin: no  ANEMIA Anemia status: controlled Etiology of anemia: CKD Duration of anemia treatment:  Compliance with treatment: excellent compliance Iron supplementation side effects: no Severity of anemia: mild Fatigue: no Decreased exercise tolerance: no  Dyspnea on exertion: no Palpitations: no Bleeding: no Pica: no   Relevant past medical, surgical, family and social history reviewed and updated as indicated. Interim medical history since our last visit  reviewed. Allergies and medications reviewed and updated.  Review of Systems  Eyes:  Negative for visual disturbance.  Respiratory:  Negative for chest tightness and shortness of breath.   Cardiovascular:  Negative for chest pain, palpitations and leg swelling.  Endocrine: Negative for polydipsia and polyuria.  Neurological:  Negative for dizziness, light-headedness, numbness and headaches.    Per HPI unless specifically indicated above     Objective:    BP (!) 155/91   Pulse 79   Temp 98.6 F (37 C) (Oral)   Wt 255 lb (115.7 kg)   SpO2 96%   BMI 36.59 kg/m   Wt Readings from Last 3 Encounters:  05/30/23 255 lb (115.7 kg)  12/04/22 241 lb 3.2 oz (109.4 kg)  09/04/22 229 lb 11.2 oz (104.2 kg)    Physical Exam Vitals and nursing note reviewed.  Constitutional:      General: He is not in acute distress.    Appearance: Normal appearance. He is not ill-appearing, toxic-appearing or diaphoretic.  HENT:     Head: Normocephalic.     Right Ear: External ear normal.     Left Ear: External ear normal.     Nose: Nose normal. No congestion or rhinorrhea.     Mouth/Throat:     Mouth: Mucous membranes are moist.  Eyes:     General:        Right eye: No discharge.        Left eye: No discharge.     Extraocular Movements: Extraocular movements intact.     Conjunctiva/sclera: Conjunctivae normal.     Pupils: Pupils are equal, round, and reactive to light.  Cardiovascular:     Rate and Rhythm: Normal rate and regular  rhythm.     Heart sounds: No murmur heard. Pulmonary:     Effort: Pulmonary effort is normal. No respiratory distress.     Breath sounds: Normal breath sounds. No wheezing, rhonchi or rales.  Abdominal:     General: Abdomen is flat. Bowel sounds are normal.  Musculoskeletal:     Cervical back: Normal range of motion and neck supple.  Skin:    General: Skin is warm and dry.     Capillary Refill: Capillary refill takes less than 2 seconds.  Neurological:      General: No focal deficit present.     Mental Status: He is alert and oriented to person, place, and time.  Psychiatric:        Mood and Affect: Mood normal.        Behavior: Behavior normal.        Thought Content: Thought content normal.        Judgment: Judgment normal.     Results for orders placed or performed in visit on 12/04/22  Microscopic Examination   Urine  Result Value Ref Range   WBC, UA 0-5 0 - 5 /hpf   RBC, Urine 0-2 0 - 2 /hpf   Epithelial Cells (non renal) 0-10 0 - 10 /hpf   Mucus, UA Present (A) Not Estab.   Bacteria, UA None seen None seen/Few  HgB A1c  Result Value Ref Range   Hgb A1c MFr Bld 6.3 (H) 4.8 - 5.6 %   Est. average glucose Bld gHb Est-mCnc 134 mg/dL  Microalbumin, Urine Waived  Result Value Ref Range   Microalb, Ur Waived 150 (H) 0 - 19 mg/L   Creatinine, Urine Waived 100 10 - 300 mg/dL   Microalb/Creat Ratio >300 (H) <30 mg/g  TSH  Result Value Ref Range   TSH 1.470 0.450 - 4.500 uIU/mL  PSA  Result Value Ref Range   Prostate Specific Ag, Serum 3.9 0.0 - 4.0 ng/mL  Lipid panel  Result Value Ref Range   Cholesterol, Total 173 100 - 199 mg/dL   Triglycerides 623 (H) 0 - 149 mg/dL   HDL 33 (L) >76 mg/dL   VLDL Cholesterol Cal 29 5 - 40 mg/dL   LDL Chol Calc (NIH) 283 (H) 0 - 99 mg/dL   Chol/HDL Ratio 5.2 (H) 0.0 - 5.0 ratio  CBC with Differential/Platelet  Result Value Ref Range   WBC 7.4 3.4 - 10.8 x10E3/uL   RBC 4.16 4.14 - 5.80 x10E6/uL   Hemoglobin 13.0 13.0 - 17.7 g/dL   Hematocrit 15.1 76.1 - 51.0 %   MCV 92 79 - 97 fL   MCH 31.3 26.6 - 33.0 pg   MCHC 33.9 31.5 - 35.7 g/dL   RDW 60.7 37.1 - 06.2 %   Platelets 219 150 - 450 x10E3/uL   Neutrophils 66 Not Estab. %   Lymphs 23 Not Estab. %   Monocytes 6 Not Estab. %   Eos 3 Not Estab. %   Basos 1 Not Estab. %   Neutrophils Absolute 5.0 1.4 - 7.0 x10E3/uL   Lymphocytes Absolute 1.7 0.7 - 3.1 x10E3/uL   Monocytes Absolute 0.4 0.1 - 0.9 x10E3/uL   EOS (ABSOLUTE) 0.2 0.0 - 0.4  x10E3/uL   Basophils Absolute 0.0 0.0 - 0.2 x10E3/uL   Immature Granulocytes 1 Not Estab. %   Immature Grans (Abs) 0.0 0.0 - 0.1 x10E3/uL  Comprehensive metabolic panel  Result Value Ref Range   Glucose 192 (H) 70 - 99 mg/dL  BUN 22 6 - 24 mg/dL   Creatinine, Ser 5.40 0.76 - 1.27 mg/dL   eGFR 82 >98 JX/BJY/7.82   BUN/Creatinine Ratio 21 (H) 9 - 20   Sodium 140 134 - 144 mmol/L   Potassium 4.3 3.5 - 5.2 mmol/L   Chloride 102 96 - 106 mmol/L   CO2 23 20 - 29 mmol/L   Calcium 8.8 8.7 - 10.2 mg/dL   Total Protein 6.7 6.0 - 8.5 g/dL   Albumin 4.0 3.8 - 4.9 g/dL   Globulin, Total 2.7 1.5 - 4.5 g/dL   Albumin/Globulin Ratio 1.5 1.2 - 2.2   Bilirubin Total 0.5 0.0 - 1.2 mg/dL   Alkaline Phosphatase 63 44 - 121 IU/L   AST 19 0 - 40 IU/L   ALT 18 0 - 44 IU/L  Urinalysis, Routine w reflex microscopic  Result Value Ref Range   Specific Gravity, UA >1.030 (H) 1.005 - 1.030   pH, UA 5.0 5.0 - 7.5   Color, UA Yellow Yellow   Appearance Ur Clear Clear   Leukocytes,UA Negative Negative   Protein,UA 3+ (A) Negative/Trace   Glucose, UA Negative Negative   Ketones, UA Negative Negative   RBC, UA Trace (A) Negative   Bilirubin, UA Negative Negative   Urobilinogen, Ur 0.2 0.2 - 1.0 mg/dL   Nitrite, UA Negative Negative   Microscopic Examination See below:       Assessment & Plan:   Problem List Items Addressed This Visit       Cardiovascular and Mediastinum   PAD (peripheral artery disease) (HCC) - Primary    Chronic, followed by podiatry at this time.  Will keep BP and cholesterol + sugars under good control. Continue aspirin. Consider vascular referral in future, if needed.  Labs ordered today.  Return in 6 months.        Relevant Medications   lisinopril (ZESTRIL) 20 MG tablet   atorvastatin (LIPITOR) 20 MG tablet   Essential hypertension    Chronic. Not well controlled.  Will increase Lisinopril to 20mg .  Recommend checking blood pressures at home and bringing log to next visit.   Labs ordered today.  Follow up in 1 month.  Call sooner if concerns arise.       Relevant Medications   lisinopril (ZESTRIL) 20 MG tablet   atorvastatin (LIPITOR) 20 MG tablet   Other Relevant Orders   Comp Met (CMET)     Endocrine   Hyperlipidemia associated with type 2 diabetes mellitus (HCC)    Chronic.  Controlled.  Continue with current medication regimen of Atorvastatin 20mg  daily.  Refills sent today.  Labs ordered today.  Return to clinic in 6 months for reevaluation.  Call sooner if concerns arise.        Relevant Medications   lisinopril (ZESTRIL) 20 MG tablet   atorvastatin (LIPITOR) 20 MG tablet   glipiZIDE (GLUCOTROL) 5 MG tablet   metFORMIN (GLUCOPHAGE) 1000 MG tablet   Other Relevant Orders   Lipid Profile   Type 2 diabetes mellitus with diabetic neuropathy, with long-term current use of insulin (HCC)    Chronic.  Controlled.  Last A1c was 6.3%.  Will send Dexcom for patient.  Continue with current medication regimen of Glipizide and Metformin.  Labs ordered today.  Return to clinic in 6 months for reevaluation.  Call sooner if concerns arise.        Relevant Medications   lisinopril (ZESTRIL) 20 MG tablet   atorvastatin (LIPITOR) 20 MG tablet   glipiZIDE (  GLUCOTROL) 5 MG tablet   metFORMIN (GLUCOPHAGE) 1000 MG tablet   Other Relevant Orders   HgB A1c     Other   Iron deficiency anemia    Labs ordered at visit today.  Will make recommendations based on lab results.        Relevant Orders   CBC w/Diff     Follow up plan: Return in about 1 month (around 06/29/2023) for BP Check.

## 2023-05-30 NOTE — Assessment & Plan Note (Signed)
Labs ordered at visit today.  Will make recommendations based on lab results.   

## 2023-05-30 NOTE — Assessment & Plan Note (Signed)
Chronic.  Controlled.  Last A1c was 6.3%.  Will send Dexcom for patient.  Continue with current medication regimen of Glipizide and Metformin.  Labs ordered today.  Return to clinic in 6 months for reevaluation.  Call sooner if concerns arise.

## 2023-05-30 NOTE — Assessment & Plan Note (Signed)
Chronic. Not well controlled.  Will increase Lisinopril to 20mg .  Recommend checking blood pressures at home and bringing log to next visit.  Labs ordered today.  Follow up in 1 month.  Call sooner if concerns arise.

## 2023-05-31 LAB — CBC WITH DIFFERENTIAL/PLATELET
Basophils Absolute: 0 10*3/uL (ref 0.0–0.2)
Basos: 1 %
EOS (ABSOLUTE): 0.3 10*3/uL (ref 0.0–0.4)
Eos: 4 %
Hematocrit: 35.2 % — ABNORMAL LOW (ref 37.5–51.0)
Hemoglobin: 11.8 g/dL — ABNORMAL LOW (ref 13.0–17.7)
Immature Grans (Abs): 0.1 10*3/uL (ref 0.0–0.1)
Immature Granulocytes: 1 %
Lymphocytes Absolute: 2.2 10*3/uL (ref 0.7–3.1)
Lymphs: 26 %
MCH: 31.6 pg (ref 26.6–33.0)
MCHC: 33.5 g/dL (ref 31.5–35.7)
MCV: 94 fL (ref 79–97)
Monocytes Absolute: 0.5 10*3/uL (ref 0.1–0.9)
Monocytes: 6 %
Neutrophils Absolute: 5.4 10*3/uL (ref 1.4–7.0)
Neutrophils: 62 %
Platelets: 211 10*3/uL (ref 150–450)
RBC: 3.74 x10E6/uL — ABNORMAL LOW (ref 4.14–5.80)
RDW: 13.1 % (ref 11.6–15.4)
WBC: 8.5 10*3/uL (ref 3.4–10.8)

## 2023-05-31 LAB — LIPID PANEL
Chol/HDL Ratio: 5.2 ratio — ABNORMAL HIGH (ref 0.0–5.0)
Cholesterol, Total: 162 mg/dL (ref 100–199)
HDL: 31 mg/dL — ABNORMAL LOW (ref >39)
LDL Chol Calc (NIH): 90 mg/dL (ref 0–99)
Triglycerides: 245 mg/dL — ABNORMAL HIGH (ref 0–149)
VLDL Cholesterol Cal: 41 mg/dL — ABNORMAL HIGH (ref 5–40)

## 2023-05-31 LAB — COMPREHENSIVE METABOLIC PANEL
ALT: 16 IU/L (ref 0–44)
AST: 20 IU/L (ref 0–40)
Albumin: 4.1 g/dL (ref 3.8–4.9)
Alkaline Phosphatase: 74 IU/L (ref 44–121)
BUN/Creatinine Ratio: 16 (ref 10–24)
BUN: 18 mg/dL (ref 8–27)
Bilirubin Total: 0.3 mg/dL (ref 0.0–1.2)
CO2: 25 mmol/L (ref 20–29)
Calcium: 9.2 mg/dL (ref 8.6–10.2)
Chloride: 104 mmol/L (ref 96–106)
Creatinine, Ser: 1.1 mg/dL (ref 0.76–1.27)
Globulin, Total: 2.5 g/dL (ref 1.5–4.5)
Glucose: 108 mg/dL — ABNORMAL HIGH (ref 70–99)
Potassium: 4.4 mmol/L (ref 3.5–5.2)
Sodium: 142 mmol/L (ref 134–144)
Total Protein: 6.6 g/dL (ref 6.0–8.5)
eGFR: 77 mL/min/{1.73_m2} (ref >59)

## 2023-05-31 LAB — HEMOGLOBIN A1C
Est. average glucose Bld gHb Est-mCnc: 140 mg/dL
Hgb A1c MFr Bld: 6.5 % — ABNORMAL HIGH (ref 4.8–5.6)

## 2023-05-31 NOTE — Progress Notes (Signed)
Hi [name]. It was nice to see you yesterday.  Your lab work looks good.  A1c increased slightly to 6.5%.  No need for medication changes at this time.  Anemia remains stable.  No concerns at this time. Continue with your current medication regimen.  Follow up as discussed.  Please let me know if you have any questions.

## 2023-06-05 ENCOUNTER — Ambulatory Visit: Payer: BC Managed Care – PPO | Admitting: Nurse Practitioner

## 2023-06-10 DIAGNOSIS — B351 Tinea unguium: Secondary | ICD-10-CM | POA: Diagnosis not present

## 2023-06-10 DIAGNOSIS — Z89432 Acquired absence of left foot: Secondary | ICD-10-CM | POA: Diagnosis not present

## 2023-06-10 DIAGNOSIS — Z89412 Acquired absence of left great toe: Secondary | ICD-10-CM | POA: Diagnosis not present

## 2023-06-10 DIAGNOSIS — E1142 Type 2 diabetes mellitus with diabetic polyneuropathy: Secondary | ICD-10-CM | POA: Diagnosis not present

## 2023-07-01 ENCOUNTER — Encounter: Payer: Self-pay | Admitting: Physician Assistant

## 2023-07-01 ENCOUNTER — Ambulatory Visit (INDEPENDENT_AMBULATORY_CARE_PROVIDER_SITE_OTHER): Payer: BC Managed Care – PPO | Admitting: Physician Assistant

## 2023-07-01 VITALS — BP 138/76 | HR 78 | Wt 255.0 lb

## 2023-07-01 DIAGNOSIS — I10 Essential (primary) hypertension: Secondary | ICD-10-CM

## 2023-07-01 NOTE — Assessment & Plan Note (Addendum)
Chronic, historic condition Lisinopril was increased to 20 mg PO every day at previous visit  Unable to assess cardiac or lungs today as patient declined these aspects of physical exam He denies issues with recent changes and has not been monitoring BP at home  BP is closer to goal today - recommend continuing with current regimen at this time, may need to add another agent if BP does not continue to improve. Patient was very closed to apt interactions today so this was not addressed but may need to discuss at later apt.   He declines needing refills today Recommend hom BP checks for monitoring Keep upcoming follow up or come in sooner if concerns arise.

## 2023-07-01 NOTE — Progress Notes (Signed)
Established Patient Office Visit  Name: [name]   MRN: [id number]    DOB: 01-24-1961   Date:07/01/2023  Today's Provider: Jacquelin Hawking, MHS, PA-C Introduced myself to the patient as a PA-C and provided education on APPs in clinical practice.         Subjective  Chief Complaint  Chief Complaint  Patient presents with   Hypertension    HPI    He denies side effects from recent increase to Lisinopril  He has not been checking at home  He denies issues or signs of low blood pressure at home   I attempted to listen to heart and lungs but patient did not indicate openness to this portion of exam and declined Cardiac and pulmonary exams. Stated "if I had known it would be this long, I would have cancelled my apt. I'm just here to get my blood pressure checked"  When I attempted to educate him on the need for a provider apt due to recent medication changes and need to ensure medication was working correctly to improve BP, he disengaged from the conversation and indicated he did not have further concerns today.  I asked several times if he had any concerns or questions today but he shook his head and replied "no" each time.  He left the apt while muttering incoherently until he reached the lobby    Patient Active Problem List   Diagnosis Date Noted   Iron deficiency anemia 09/04/2022   Essential hypertension 06/24/2022   Osteomyelitis of foot, left, acute (HCC) 06/21/2022   GERD (gastroesophageal reflux disease) 06/21/2022   Transaminitis 06/21/2022   Diabetic foot infection (HCC) 06/20/2022   Ulcer of left foot, limited to breakdown of skin (HCC) 06/06/2022   PAD (peripheral artery disease) (HCC) 07/03/2020   Type 2 diabetes mellitus with diabetic neuropathy, with long-term current use of insulin (HCC) 05/23/2020   History of transmetatarsal amputation of left foot (HCC) 05/23/2020   Hyperlipidemia associated with type 2 diabetes mellitus (HCC) 02/08/2020   ED  (erectile dysfunction) 12/28/2018   Obesity 12/24/2018    Past Surgical History:  Procedure Laterality Date   ADENOIDECTOMY     AMPUTATION Right 10/07/2020   Procedure: AMPUTATION RAY-Right [place] Ray;  Surgeon: Rosetta Posner, DPM;  Location: [facility];  Service: Podiatry;  Laterality: Right;   AMPUTATION Left 01/07/2021   Procedure: AMPUTATION RAY - Left 1st and 2nd Ray;  Surgeon: Rosetta Posner, DPM;  Location: [facility];  Service: Podiatry;  Laterality: Left;   AMPUTATION TOE     CATARACT EXTRACTION W/PHACO Right 03/03/2020   Procedure: CATARACT EXTRACTION PHACO AND INTRAOCULAR LENS PLACEMENT (IOC) RIGHT DIABETIC VISION BLUE;  Surgeon: Elliot Cousin, MD;  Location: [facility];  Service: Ophthalmology;  Laterality: Right;  COVID ( + ) 12-29-2019  30.97 02:27.7   CATARACT EXTRACTION W/PHACO Left 06/23/2020   Procedure: CATARACT EXTRACTION PHACO AND INTRAOCULAR LENS PLACEMENT (IOC) LEFT DIABETIC VISION BLUE 5.14  00:34.7;  Surgeon: Elliot Cousin, MD;  Location: [facility];  Service: Ophthalmology;  Laterality: Left;  DIABETIC   ELBOW FRACTURE SURGERY Right    EXTERNAL EAR SURGERY     IRRIGATION AND DEBRIDEMENT ABSCESS Left 06/21/2022   Procedure: IRRIGATION AND DEBRIDEMENT ABSCESS;  Surgeon: Linus Galas, DPM;  Location: [facility];  Service: Podiatry;  Laterality: Left;   IRRIGATION AND DEBRIDEMENT FOOT Right 10/07/2020   Procedure: IRRIGATION AND DEBRIDEMENT FOOT;  Surgeon: Rosetta Posner, DPM;  Location: [facility];  Service: Podiatry;  Laterality: Right;   TRANSMETATARSAL AMPUTATION Left 06/21/2022   Procedure: TRANSMETATARSAL AMPUTATION-1st and 2nd;  Surgeon: Linus Galas, DPM;  Location: [facility];  Service: Podiatry;  Laterality: Left;    Family History  Problem Relation Age of Onset   Dementia Mother    Hypertension Mother    Cancer Brother        Cancer   Diabetes Maternal Grandmother    Hypertension Maternal Grandmother    COPD Neg Hx    Heart disease Neg Hx    Stroke  Neg Hx     Social History   Tobacco Use   Smoking status: Never   Smokeless tobacco: Never  Substance Use Topics   Alcohol use: Not Currently    Comment: occasionally     Current Outpatient Medications:    Ascorbic Acid (VITAMIN C CR) 1500 MG TBCR, Take 1,500 mg by mouth daily., Disp: , Rfl:    aspirin EC 325 MG tablet, Take 325 mg by mouth daily., Disp: , Rfl:    atorvastatin (LIPITOR) 20 MG tablet, Take 1 tablet (20 mg total) by mouth every other day., Disp: 90 tablet, Rfl: 1   B Complex Vitamins (B COMPLEX 1 PO), Take 1 tablet by mouth daily., Disp: , Rfl:    BD PEN NEEDLE MICRO U/F 32G X 6 MM MISC, USE 1  ONCE DAILY AS NEEDED, Disp: 100 each, Rfl: 2   Cinnamon 500 MG capsule, Take 500 mg by mouth daily., Disp: , Rfl:    Continuous Glucose Receiver (DEXCOM G7 RECEIVER) DEVI, 1 Units by Does not apply route continuous., Disp: 1 each, Rfl: 0   Continuous Glucose Sensor (DEXCOM G7 SENSOR) MISC, 1 Units by Does not apply route every 14 (fourteen) days., Disp: 6 each, Rfl: 1   Garlic 1000 MG CAPS, Take 1,000 mg by mouth daily., Disp: , Rfl:    glipiZIDE (GLUCOTROL) 5 MG tablet, Take 1 tablet (5 mg total) by mouth 2 (two) times daily before a meal., Disp: 180 tablet, Rfl: 1   lisinopril (ZESTRIL) 20 MG tablet, Take 1 tablet (20 mg total) by mouth daily., Disp: 90 tablet, Rfl: 1   metFORMIN (GLUCOPHAGE) 1000 MG tablet, Take 1 tablet (1,000 mg total) by mouth 2 (two) times daily with a meal., Disp: 180 tablet, Rfl: 1  No Known Allergies  I personally reviewed active problem list, medication list, allergies, notes from last encounter, lab results with the patient/caregiver today.   Review of Systems  Eyes:  Negative for blurred vision and double vision.  Respiratory:  Negative for shortness of breath.   Cardiovascular:  Negative for chest pain, palpitations and leg swelling.  Neurological:  Negative for dizziness, tingling, weakness and headaches.      Objective  Vitals:    07/01/23 1526  BP: 138/76  Pulse: 78  SpO2: 96%  Weight: 255 lb (115.7 kg)    Body mass index is 36.59 kg/m.  Physical Exam Vitals reviewed.  Constitutional:      General: He is awake.     Appearance: Normal appearance. He is well-developed and well-groomed.  HENT:     Head: Normocephalic and atraumatic.  Neurological:     Mental Status: He is alert.  Psychiatric:        Mood and Affect: Affect is labile, angry and inappropriate.        Behavior: Behavior is uncooperative, agitated and withdrawn.      Recent Results (from the past 2160 hour(s))  Comp Met (CMET)  Status: Abnormal   Collection Time: 05/30/23  3:51 PM  Result Value Ref Range   Glucose 108 (H) 70 - 99 mg/dL   BUN 18 8 - 27 mg/dL   Creatinine, Ser 3.24 0.76 - 1.27 mg/dL   eGFR 77 >40 NU/UVO/5.36   BUN/Creatinine Ratio 16 10 - 24   Sodium 142 134 - 144 mmol/L   Potassium 4.4 3.5 - 5.2 mmol/L   Chloride 104 96 - 106 mmol/L   CO2 25 20 - 29 mmol/L   Calcium 9.2 8.6 - 10.2 mg/dL   Total Protein 6.6 6.0 - 8.5 g/dL   Albumin 4.1 3.8 - 4.9 g/dL   Globulin, Total 2.5 1.5 - 4.5 g/dL   Bilirubin Total 0.3 0.0 - 1.2 mg/dL   Alkaline Phosphatase 74 44 - 121 IU/L   AST 20 0 - 40 IU/L   ALT 16 0 - 44 IU/L  Lipid Profile     Status: Abnormal   Collection Time: 05/30/23  3:51 PM  Result Value Ref Range   Cholesterol, Total 162 100 - 199 mg/dL   Triglycerides 644 (H) 0 - 149 mg/dL   HDL 31 (L) >03 mg/dL   VLDL Cholesterol Cal 41 (H) 5 - 40 mg/dL   LDL Chol Calc (NIH) 90 0 - 99 mg/dL   Chol/HDL Ratio 5.2 (H) 0.0 - 5.0 ratio    Comment:                                   T. Chol/HDL Ratio                                             Men  Women                               1/2 Avg.Risk  3.4    3.3                                   Avg.Risk  5.0    4.4                                2X Avg.Risk  9.6    7.1                                3X Avg.Risk 23.4   11.0   HgB A1c     Status: Abnormal   Collection Time:  05/30/23  3:51 PM  Result Value Ref Range   Hgb A1c MFr Bld 6.5 (H) 4.8 - 5.6 %    Comment:          Prediabetes: 5.7 - 6.4          Diabetes: >6.4          Glycemic control for adults with diabetes: <7.0    Est. average glucose Bld gHb Est-mCnc 140 mg/dL  CBC w/Diff     Status: Abnormal   Collection Time: 05/30/23  3:51 PM  Result Value Ref Range   WBC 8.5 3.4 - 10.8 x10E3/uL  RBC 3.74 (L) 4.14 - 5.80 x10E6/uL   Hemoglobin 11.8 (L) 13.0 - 17.7 g/dL   Hematocrit 78.2 (L) 95.6 - 51.0 %   MCV 94 79 - 97 fL   MCH 31.6 26.6 - 33.0 pg   MCHC 33.5 31.5 - 35.7 g/dL   RDW 21.3 08.6 - 57.8 %   Platelets 211 150 - 450 x10E3/uL   Neutrophils 62 Not Estab. %   Lymphs 26 Not Estab. %   Monocytes 6 Not Estab. %   Eos 4 Not Estab. %   Basos 1 Not Estab. %   Neutrophils Absolute 5.4 1.4 - 7.0 x10E3/uL   Lymphocytes Absolute 2.2 0.7 - 3.1 x10E3/uL   Monocytes Absolute 0.5 0.1 - 0.9 x10E3/uL   EOS (ABSOLUTE) 0.3 0.0 - 0.4 x10E3/uL   Basophils Absolute 0.0 0.0 - 0.2 x10E3/uL   Immature Granulocytes 1 Not Estab. %   Immature Grans (Abs) 0.1 0.0 - 0.1 x10E3/uL     PHQ2/9:    07/01/2023    3:29 PM 05/30/2023    3:37 PM 12/04/2022    3:23 PM 09/04/2022    3:35 PM 07/06/2022    2:42 PM  Depression screen PHQ 2/9  Decreased Interest 0 0 0 0 0  Down, Depressed, Hopeless 0 0 0 0 0  PHQ - 2 Score 0 0 0 0 0  Altered sleeping 0 0 0 0 0  Tired, decreased energy 0 0 0 0 0  Change in appetite 0 0 0 0 0  Feeling bad or failure about yourself  0 0 0 0 0  Trouble concentrating 0 0 0 0 0  Moving slowly or fidgety/restless 0 0 0 0 0  Suicidal thoughts 0 0 0 0 0  PHQ-9 Score 0 0 0 0 0  Difficult doing work/chores Not difficult at all Not difficult at all Not difficult at all Not difficult at all       Fall Risk:    07/01/2023    3:28 PM 12/04/2022    3:23 PM 09/04/2022    3:35 PM 06/06/2022    3:08 PM 03/30/2022   10:48 AM  Fall Risk   Falls in the past year? 0 0 0 0 0  Number falls in past  yr: 0 0 0 0 0  Injury with Fall? 0 0 0 0 0  Risk for fall due to : No Fall Risks No Fall Risks No Fall Risks No Fall Risks No Fall Risks  Follow up Falls evaluation completed Falls evaluation completed Falls evaluation completed Falls evaluation completed Falls evaluation completed      Functional Status Survey:      Assessment & Plan  Problem List Items Addressed This Visit       Cardiovascular and Mediastinum   Essential hypertension - Primary    Chronic, historic condition Lisinopril was increased to 20 mg PO every day at previous visit  Unable to assess cardiac or lungs today as patient declined these aspects of physical exam He denies issues with recent changes and has not been monitoring BP at home  BP is closer to goal today - recommend continuing with current regimen at this time, may need to add another agent if BP does not continue to improve. Patient was very closed to apt interactions today so this was not addressed but may need to discuss at later apt.   He declines needing refills today Recommend hom BP checks for monitoring Keep upcoming follow up or come in sooner if  concerns arise.          No follow-ups on file.   I, Tsugio Elison E Chasmine Lender, PA-C, have reviewed all documentation for this visit. The documentation on 07/01/23 for the exam, diagnosis, procedures, and orders are all accurate and complete.   Jacquelin Hawking, MHS, PA-C [facility]

## 2023-09-18 DIAGNOSIS — E1142 Type 2 diabetes mellitus with diabetic polyneuropathy: Secondary | ICD-10-CM | POA: Diagnosis not present

## 2023-09-18 DIAGNOSIS — B351 Tinea unguium: Secondary | ICD-10-CM | POA: Diagnosis not present

## 2023-10-14 DIAGNOSIS — Z961 Presence of intraocular lens: Secondary | ICD-10-CM | POA: Diagnosis not present

## 2023-10-14 DIAGNOSIS — E113393 Type 2 diabetes mellitus with moderate nonproliferative diabetic retinopathy without macular edema, bilateral: Secondary | ICD-10-CM | POA: Diagnosis not present

## 2023-11-11 DIAGNOSIS — Z961 Presence of intraocular lens: Secondary | ICD-10-CM | POA: Diagnosis not present

## 2023-11-11 DIAGNOSIS — E113393 Type 2 diabetes mellitus with moderate nonproliferative diabetic retinopathy without macular edema, bilateral: Secondary | ICD-10-CM | POA: Diagnosis not present

## 2023-11-11 DIAGNOSIS — H4312 Vitreous hemorrhage, left eye: Secondary | ICD-10-CM | POA: Diagnosis not present

## 2023-11-11 LAB — HM DIABETES EYE EXAM

## 2023-12-02 ENCOUNTER — Encounter: Payer: BC Managed Care – PPO | Admitting: Nurse Practitioner

## 2023-12-16 DIAGNOSIS — E113393 Type 2 diabetes mellitus with moderate nonproliferative diabetic retinopathy without macular edema, bilateral: Secondary | ICD-10-CM | POA: Diagnosis not present

## 2023-12-16 DIAGNOSIS — H4312 Vitreous hemorrhage, left eye: Secondary | ICD-10-CM | POA: Diagnosis not present

## 2023-12-19 DIAGNOSIS — Z89412 Acquired absence of left great toe: Secondary | ICD-10-CM | POA: Diagnosis not present

## 2023-12-19 DIAGNOSIS — Z89432 Acquired absence of left foot: Secondary | ICD-10-CM | POA: Diagnosis not present

## 2023-12-19 DIAGNOSIS — B351 Tinea unguium: Secondary | ICD-10-CM | POA: Diagnosis not present

## 2023-12-19 DIAGNOSIS — E1142 Type 2 diabetes mellitus with diabetic polyneuropathy: Secondary | ICD-10-CM | POA: Diagnosis not present

## 2023-12-25 ENCOUNTER — Encounter: Payer: Self-pay | Admitting: Nurse Practitioner

## 2023-12-25 ENCOUNTER — Telehealth: Payer: Self-pay

## 2023-12-25 ENCOUNTER — Ambulatory Visit: Payer: BC Managed Care – PPO | Admitting: Nurse Practitioner

## 2023-12-25 VITALS — BP 117/78 | HR 84 | Temp 98.2°F | Ht 69.29 in | Wt 252.4 lb

## 2023-12-25 DIAGNOSIS — E785 Hyperlipidemia, unspecified: Secondary | ICD-10-CM

## 2023-12-25 DIAGNOSIS — E6609 Other obesity due to excess calories: Secondary | ICD-10-CM

## 2023-12-25 DIAGNOSIS — I739 Peripheral vascular disease, unspecified: Secondary | ICD-10-CM | POA: Diagnosis not present

## 2023-12-25 DIAGNOSIS — Z6832 Body mass index (BMI) 32.0-32.9, adult: Secondary | ICD-10-CM

## 2023-12-25 DIAGNOSIS — E1140 Type 2 diabetes mellitus with diabetic neuropathy, unspecified: Secondary | ICD-10-CM

## 2023-12-25 DIAGNOSIS — Z794 Long term (current) use of insulin: Secondary | ICD-10-CM | POA: Diagnosis not present

## 2023-12-25 DIAGNOSIS — D509 Iron deficiency anemia, unspecified: Secondary | ICD-10-CM

## 2023-12-25 DIAGNOSIS — E66811 Obesity, class 1: Secondary | ICD-10-CM

## 2023-12-25 DIAGNOSIS — E1169 Type 2 diabetes mellitus with other specified complication: Secondary | ICD-10-CM

## 2023-12-25 DIAGNOSIS — Z0000 Encounter for general adult medical examination without abnormal findings: Secondary | ICD-10-CM | POA: Diagnosis not present

## 2023-12-25 DIAGNOSIS — I10 Essential (primary) hypertension: Secondary | ICD-10-CM

## 2023-12-25 DIAGNOSIS — E119 Type 2 diabetes mellitus without complications: Secondary | ICD-10-CM

## 2023-12-25 LAB — MICROSCOPIC EXAMINATION
Bacteria, UA: NONE SEEN
RBC, Urine: NONE SEEN /[HPF] (ref 0–2)

## 2023-12-25 LAB — URINALYSIS, ROUTINE W REFLEX MICROSCOPIC
Bilirubin, UA: NEGATIVE
Glucose, UA: NEGATIVE
Ketones, UA: NEGATIVE
Leukocytes,UA: NEGATIVE
Nitrite, UA: NEGATIVE
RBC, UA: NEGATIVE
Specific Gravity, UA: >1.03 — ABNORMAL HIGH (ref 1.005–1.030)
Urobilinogen, Ur: 0.2 mg/dL (ref 0.2–1.0)
pH, UA: 5 (ref 5.0–7.5)

## 2023-12-25 LAB — MICROALBUMIN, URINE WAIVED
Creatinine, Urine Waived: 300 mg/dL (ref 10–300)
Microalb, Ur Waived: 150 mg/L — ABNORMAL HIGH (ref 0–19)

## 2023-12-25 MED ORDER — GLIPIZIDE 5 MG PO TABS: 5.0000 mg | ORAL_TABLET | Freq: Two times a day (BID) | ORAL | 1 refills | Status: DC

## 2023-12-25 MED ORDER — METFORMIN HCL 1000 MG PO TABS: 1000.0000 mg | ORAL_TABLET | Freq: Two times a day (BID) | ORAL | 1 refills | Status: DC

## 2023-12-25 MED ORDER — ATORVASTATIN CALCIUM 20 MG PO TABS: 20.0000 mg | ORAL_TABLET | ORAL | 1 refills | Status: DC

## 2023-12-25 MED ORDER — LISINOPRIL 20 MG PO TABS: 20.0000 mg | ORAL_TABLET | Freq: Every day | ORAL | 1 refills | Status: DC

## 2023-12-25 NOTE — Telephone Encounter (Signed)
 Patient most recent diabetic eye exam was requested.

## 2023-12-25 NOTE — Telephone Encounter (Signed)
-----   Message from Aileen Alexanders sent at 12/25/2023  3:43 PM EST ----- Can we request his eye exam from  eye

## 2023-12-25 NOTE — Assessment & Plan Note (Signed)
Chronic, followed by podiatry at this time.  Will keep BP and cholesterol + sugars under good control. Continue aspirin. Consider vascular referral in future, if needed.  Labs ordered today.  Return in 6 months.

## 2023-12-25 NOTE — Assessment & Plan Note (Signed)
 Recommended eating smaller high protein, low fat meals more frequently and exercising 30 mins a day 5 times a week with a goal of 10-15lb weight loss in the next 3 months.

## 2023-12-25 NOTE — Assessment & Plan Note (Signed)
 Chronic.  Controlled.  Last A1c was 6.5%.  Not able to pick up Dexcom.  Continue with current medication regimen of Glipizide  and Metformin .  Will request eye exam from [facility].  Labs ordered today.  Return to clinic in 6 months for reevaluation.  Call sooner if concerns arise.

## 2023-12-25 NOTE — Progress Notes (Signed)
 BP 117/78 (BP Location: Right Arm, Patient Position: Sitting, Cuff Size: Large)   Pulse 84   Temp 98.2 F (36.8 C) (Oral)   Ht 5' 9.29" (1.76 m)   Wt 252 lb 6.4 oz (114.5 kg)   SpO2 98%   BMI 36.96 kg/m    Subjective:    Patient ID: [name], male    DOB: [birth year], 63 y.o.   MRN: [id number]  HPI: [name] is a 63 y.o. male presenting on 12/25/2023 for comprehensive medical examination. Current medical complaints include:none  He currently lives with: Interim Problems from his last visit: no  HYPERLIPIDEMIA Hyperlipidemia status: excellent compliance Satisfied with current treatment?  no Side effects:  no Medication compliance: excellent compliance Past cholesterol meds: atorvastain (lipitor) Supplements: none Aspirin:  no The 10-year ASCVD risk score (Arnett DK, et al., 2019) is: 18.4%   Values used to calculate the score:     Age: 63 years     Sex: Male     Is Non-Hispanic African American: No     Diabetic: Yes     Tobacco smoker: No     Systolic Blood Pressure: 117 mmHg     Is BP treated: Yes     HDL Cholesterol: 31 mg/dL     Total Cholesterol: 162 mg/dL Chest pain:  no Coronary artery disease:  no Family history CAD:  no Family history early CAD:  no  Denies HA, CP, SOB, dizziness, palpitations, visual changes, and lower extremity swelling.   DIABETES Hypoglycemic episodes:no Polydipsia/polyuria: no Visual disturbance: no Chest pain: no Paresthesias: yes Glucose Monitoring: yes  Accucheck frequency:  every other day  Fasting glucose: 140  Post prandial:  Evening:  Before meals: Taking Insulin ?: yes  Long acting insulin :  Short acting insulin : Blood Pressure Monitoring: not checking Retinal Examination: Up to Date- need to request from [facility] Foot Exam: Up to Date Diabetic Education: Not Completed Pneumovax: Up to Date Influenza: Not up to Date Aspirin: yes   Depression Screen done today and results listed below:      12/25/2023    3:25 PM 07/01/2023    3:29 PM 05/30/2023    3:37 PM 12/04/2022    3:23 PM 09/04/2022    3:35 PM  Depression screen PHQ 2/9  Decreased Interest 0 0 0 0 0  Down, Depressed, Hopeless 0 0 0 0 0  PHQ - 2 Score 0 0 0 0 0  Altered sleeping 0 0 0 0 0  Tired, decreased energy 0 0 0 0 0  Change in appetite 0 0 0 0 0  Feeling bad or failure about yourself  0 0 0 0 0  Trouble concentrating 0 0 0 0 0  Moving slowly or fidgety/restless 0 0 0 0 0  Suicidal thoughts 0 0 0 0 0  PHQ-9 Score 0 0 0 0 0  Difficult doing work/chores  Not difficult at all Not difficult at all Not difficult at all Not difficult at all    The patient does not have a history of falls. I did complete a risk assessment for falls. A plan of care for falls was documented.   Past Medical History:  Past Medical History:  Diagnosis Date   Arthritis    hands   COVID-19 12/29/2019   Diabetes mellitus without complication (HCC)    type 2   GERD (gastroesophageal reflux disease)    Hypercholesterolemia    Osteomyelitis of left foot [facility])     Surgical History:  Past  Surgical History:  Procedure Laterality Date   ADENOIDECTOMY     AMPUTATION Right 10/07/2020   Procedure: AMPUTATION RAY-Right Fifth Ray;  Surgeon: Pink Bridges, DPM;  Location: [facility];  Service: Podiatry;  Laterality: Right;   AMPUTATION Left 01/07/2021   Procedure: AMPUTATION RAY - Left 1st and 2nd Ray;  Surgeon: Pink Bridges, DPM;  Location: [facility];  Service: Podiatry;  Laterality: Left;   AMPUTATION TOE     CATARACT EXTRACTION W/PHACO Right 03/03/2020   Procedure: CATARACT EXTRACTION PHACO AND INTRAOCULAR LENS PLACEMENT (IOC) RIGHT DIABETIC VISION BLUE;  Surgeon: Ola Berger, MD;  Location: [facility];  Service: Ophthalmology;  Laterality: Right;  COVID ( + ) 12-29-2019  30.97 02:27.7   CATARACT EXTRACTION W/PHACO Left 06/23/2020   Procedure: CATARACT EXTRACTION PHACO AND INTRAOCULAR LENS PLACEMENT (IOC) LEFT DIABETIC VISION BLUE  5.14  00:34.7;  Surgeon: Ola Berger, MD;  Location: [facility];  Service: Ophthalmology;  Laterality: Left;  DIABETIC   ELBOW FRACTURE SURGERY Right    EXTERNAL EAR SURGERY     IRRIGATION AND DEBRIDEMENT ABSCESS Left 06/21/2022   Procedure: IRRIGATION AND DEBRIDEMENT ABSCESS;  Surgeon: Angel Barba, DPM;  Location: [facility];  Service: Podiatry;  Laterality: Left;   IRRIGATION AND DEBRIDEMENT FOOT Right 10/07/2020   Procedure: IRRIGATION AND DEBRIDEMENT FOOT;  Surgeon: Pink Bridges, DPM;  Location: [facility];  Service: Podiatry;  Laterality: Right;   TRANSMETATARSAL AMPUTATION Left 06/21/2022   Procedure: TRANSMETATARSAL AMPUTATION-1st and 2nd;  Surgeon: Angel Barba, DPM;  Location: [facility];  Service: Podiatry;  Laterality: Left;    Medications:  Current Outpatient Medications on File Prior to Visit  Medication Sig   Ascorbic Acid  (VITAMIN C CR) 1500 MG TBCR Take 1,500 mg by mouth daily.   aspirin EC 325 MG tablet Take 325 mg by mouth daily.   B Complex Vitamins (B COMPLEX 1 PO) Take 1 tablet by mouth daily.   Cinnamon 500 MG capsule Take 500 mg by mouth daily.   Garlic 1000 MG CAPS Take 1,000 mg by mouth daily.   No current facility-administered medications on file prior to visit.    Allergies:  No Known Allergies  Social History:  Social History   Socioeconomic History   Marital status: Single    Spouse name: Not on file   Number of children: Not on file   Years of education: Not on file   Highest education level: Not on file  Occupational History   Not on file  Tobacco Use   Smoking status: Never   Smokeless tobacco: Never  Vaping Use   Vaping status: Never Used  Substance and Sexual Activity   Alcohol use: Not Currently    Comment: occasionally   Drug use: No   Sexual activity: Not Currently  Other Topics Concern   Not on file  Social History Narrative   Not on file   Social Drivers of Health   Financial Resource Strain: Not on file  Food  Insecurity: Not on file  Transportation Needs: Not on file  Physical Activity: Not on file  Stress: Not on file  Social Connections: Not on file  Intimate Partner Violence: Not on file   Social History   Tobacco Use  Smoking Status Never  Smokeless Tobacco Never   Social History   Substance and Sexual Activity  Alcohol Use Not Currently   Comment: occasionally    Family History:  Family History  Problem Relation Age of Onset   Dementia Mother  Hypertension Mother    Cancer Brother        Cancer   Diabetes Maternal Grandmother    Hypertension Maternal Grandmother    COPD Neg Hx    Heart disease Neg Hx    Stroke Neg Hx     Past medical history, surgical history, medications, allergies, family history and social history reviewed with patient today and changes made to appropriate areas of the chart.   Review of Systems  HENT:         Denies vision changes.  Cardiovascular:  Positive for leg swelling.  Neurological:  Negative for tingling.  Endo/Heme/Allergies:  Negative for polydipsia.       Denies Polyuria   All other ROS negative except what is listed above and in the HPI.      Objective:    BP 117/78 (BP Location: Right Arm, Patient Position: Sitting, Cuff Size: Large)   Pulse 84   Temp 98.2 F (36.8 C) (Oral)   Ht 5' 9.29" (1.76 m)   Wt 252 lb 6.4 oz (114.5 kg)   SpO2 98%   BMI 36.96 kg/m   Wt Readings from Last 3 Encounters:  12/25/23 252 lb 6.4 oz (114.5 kg)  07/01/23 255 lb (115.7 kg)  05/30/23 255 lb (115.7 kg)    Physical Exam Vitals and nursing note reviewed.  Constitutional:      General: He is not in acute distress.    Appearance: Normal appearance. He is not ill-appearing, toxic-appearing or diaphoretic.  HENT:     Head: Normocephalic.     Right Ear: Tympanic membrane, ear canal and external ear normal.     Left Ear: Tympanic membrane, ear canal and external ear normal.     Nose: Nose normal. No congestion or rhinorrhea.      Mouth/Throat:     Mouth: Mucous membranes are moist.  Eyes:     General:        Right eye: No discharge.        Left eye: No discharge.     Extraocular Movements: Extraocular movements intact.     Conjunctiva/sclera: Conjunctivae normal.     Pupils: Pupils are equal, round, and reactive to light.  Cardiovascular:     Rate and Rhythm: Normal rate and regular rhythm.     Heart sounds: No murmur heard. Pulmonary:     Effort: Pulmonary effort is normal. No respiratory distress.     Breath sounds: Normal breath sounds. No wheezing, rhonchi or rales.  Abdominal:     General: Abdomen is flat. Bowel sounds are normal. There is no distension.     Palpations: Abdomen is soft.     Tenderness: There is no abdominal tenderness. There is no guarding.  Musculoskeletal:     Cervical back: Normal range of motion and neck supple.     Right lower leg: No edema.     Left lower leg: No edema.  Skin:    General: Skin is warm and dry.     Capillary Refill: Capillary refill takes less than 2 seconds.  Neurological:     General: No focal deficit present.     Mental Status: He is alert and oriented to person, place, and time.     Cranial Nerves: No cranial nerve deficit.     Motor: No weakness.     Deep Tendon Reflexes: Reflexes normal.  Psychiatric:        Mood and Affect: Mood normal.        Behavior: Behavior  normal.        Thought Content: Thought content normal.        Judgment: Judgment normal.     Results for orders placed or performed in visit on 05/30/23  Comp Met (CMET)   Collection Time: 05/30/23  3:51 PM  Result Value Ref Range   Glucose 108 (H) 70 - 99 mg/dL   BUN 18 8 - 27 mg/dL   Creatinine, Ser 1.61 0.76 - 1.27 mg/dL   eGFR 77 >09 UE/AVW/0.98   BUN/Creatinine Ratio 16 10 - 24   Sodium 142 134 - 144 mmol/L   Potassium 4.4 3.5 - 5.2 mmol/L   Chloride 104 96 - 106 mmol/L   CO2 25 20 - 29 mmol/L   Calcium  9.2 8.6 - 10.2 mg/dL   Total Protein 6.6 6.0 - 8.5 g/dL   Albumin 4.1  3.8 - 4.9 g/dL   Globulin, Total 2.5 1.5 - 4.5 g/dL   Bilirubin Total 0.3 0.0 - 1.2 mg/dL   Alkaline Phosphatase 74 44 - 121 IU/L   AST 20 0 - 40 IU/L   ALT 16 0 - 44 IU/L  Lipid Profile   Collection Time: 05/30/23  3:51 PM  Result Value Ref Range   Cholesterol, Total 162 100 - 199 mg/dL   Triglycerides 119 (H) 0 - 149 mg/dL   HDL 31 (L) >14 mg/dL   VLDL Cholesterol Cal 41 (H) 5 - 40 mg/dL   LDL Chol Calc (NIH) 90 0 - 99 mg/dL   Chol/HDL Ratio 5.2 (H) 0.0 - 5.0 ratio  HgB A1c   Collection Time: 05/30/23  3:51 PM  Result Value Ref Range   Hgb A1c MFr Bld 6.5 (H) 4.8 - 5.6 %   Est. average glucose Bld gHb Est-mCnc 140 mg/dL  CBC w/Diff   Collection Time: 05/30/23  3:51 PM  Result Value Ref Range   WBC 8.5 3.4 - 10.8 x10E3/uL   RBC 3.74 (L) 4.14 - 5.80 x10E6/uL   Hemoglobin 11.8 (L) 13.0 - 17.7 g/dL   Hematocrit 78.2 (L) 95.6 - 51.0 %   MCV 94 79 - 97 fL   MCH 31.6 26.6 - 33.0 pg   MCHC 33.5 31.5 - 35.7 g/dL   RDW 21.3 08.6 - 57.8 %   Platelets 211 150 - 450 x10E3/uL   Neutrophils 62 Not Estab. %   Lymphs 26 Not Estab. %   Monocytes 6 Not Estab. %   Eos 4 Not Estab. %   Basos 1 Not Estab. %   Neutrophils Absolute 5.4 1.4 - 7.0 x10E3/uL   Lymphocytes Absolute 2.2 0.7 - 3.1 x10E3/uL   Monocytes Absolute 0.5 0.1 - 0.9 x10E3/uL   EOS (ABSOLUTE) 0.3 0.0 - 0.4 x10E3/uL   Basophils Absolute 0.0 0.0 - 0.2 x10E3/uL   Immature Granulocytes 1 Not Estab. %   Immature Grans (Abs) 0.1 0.0 - 0.1 x10E3/uL      Assessment & Plan:   Problem List Items Addressed This Visit       Cardiovascular and Mediastinum   PAD (peripheral artery disease) (HCC)   Chronic, followed by podiatry at this time.  Will keep BP and cholesterol + sugars under good control. Continue aspirin. Consider vascular referral in future, if needed.  Labs ordered today.  Return in 6 months.        Relevant Medications   atorvastatin  (LIPITOR) 20 MG tablet   lisinopril  (ZESTRIL ) 20 MG tablet   Essential  hypertension   Chronic, Controlled. On Lisinopril  20mg .  Labs ordered at visit today.  Recommend hom BP checks for monitoring Follow up in 6 months.       Relevant Medications   atorvastatin  (LIPITOR) 20 MG tablet   lisinopril  (ZESTRIL ) 20 MG tablet     Endocrine   Hyperlipidemia associated with type 2 diabetes mellitus (HCC)   Chronic.  Controlled.  Continue with current medication regimen of Atorvastatin  20mg  daily.  Refills sent today.  Labs ordered today.  Return to clinic in 6 months for reevaluation.  Call sooner if concerns arise.       Relevant Medications   atorvastatin  (LIPITOR) 20 MG tablet   glipiZIDE  (GLUCOTROL ) 5 MG tablet   lisinopril  (ZESTRIL ) 20 MG tablet   metFORMIN  (GLUCOPHAGE ) 1000 MG tablet   Other Relevant Orders   Lipid panel   Diabetes mellitus treated with insulin  and oral medication (HCC)   Chronic.  Controlled.  Last A1c was 6.5%.  Not able to pick up Dexcom.  Continue with current medication regimen of Glipizide  and Metformin .  Will request eye exam from [facility].  Labs ordered today.  Return to clinic in 6 months for reevaluation.  Call sooner if concerns arise.       Relevant Medications   atorvastatin  (LIPITOR) 20 MG tablet   glipiZIDE  (GLUCOTROL ) 5 MG tablet   lisinopril  (ZESTRIL ) 20 MG tablet   metFORMIN  (GLUCOPHAGE ) 1000 MG tablet     Other   Obesity   Recommended eating smaller high protein, low fat meals more frequently and exercising 30 mins a day 5 times a week with a goal of 10-15lb weight loss in the next 3 months.       Relevant Medications   glipiZIDE  (GLUCOTROL ) 5 MG tablet   metFORMIN  (GLUCOPHAGE ) 1000 MG tablet   Iron deficiency anemia   Labs ordered at visit today.  Will make recommendations based on lab results.        Other Visit Diagnoses       Annual physical exam    -  Primary   Health maintenance reviewed during visit today.  Labs ordered.  Declined vaccines. Colon cancer screening up to date.   Relevant  Orders   TSH   PSA   Lipid panel   CBC with Differential/Platelet   Comprehensive metabolic panel   Urinalysis, Routine w reflex microscopic   Hemoglobin A1c   Microalbumin, Urine Waived        Discussed aspirin prophylaxis for myocardial infarction prevention and decision was made to continue ASA  LABORATORY TESTING:  Health maintenance labs ordered today as discussed above.   The natural history of prostate cancer and ongoing controversy regarding screening and potential treatment outcomes of prostate cancer has been discussed with the patient. The meaning of a false positive PSA and a false negative PSA has been discussed. He indicates understanding of the limitations of this screening test and wishes to proceed with screening PSA testing.   IMMUNIZATIONS:   - Tdap: Tetanus vaccination status reviewed: last tetanus booster within 10 years. - Influenza: Refused - Pneumovax: Up to date - Prevnar: Not applicable - COVID: Refused - HPV: Not applicable - Shingrix vaccine: Refused  SCREENING: - Colonoscopy: Up to date  Discussed with patient purpose of the colonoscopy is to detect colon cancer at curable precancerous or early stages   - AAA Screening: Not applicable  -Hearing Test: Not applicable  -Spirometry: Not applicable   PATIENT COUNSELING:    Sexuality: Discussed sexually transmitted diseases, partner selection, use of condoms, avoidance  of unintended pregnancy  and contraceptive alternatives.   Advised to avoid cigarette smoking.  I discussed with the patient that most people either abstain from alcohol or drink within safe limits (<=14/week and <=4 drinks/occasion for males, <=7/weeks and <= 3 drinks/occasion for females) and that the risk for alcohol disorders and other health effects rises proportionally with the number of drinks per week and how often a drinker exceeds daily limits.  Discussed cessation/primary prevention of drug use and availability of  treatment for abuse.   Diet: Encouraged to adjust caloric intake to maintain  or achieve ideal body weight, to reduce intake of dietary saturated fat and total fat, to limit sodium intake by avoiding high sodium foods and not adding table salt, and to maintain adequate dietary potassium and calcium  preferably from fresh fruits, vegetables, and low-fat dairy products.    stressed the importance of regular exercise  Injury prevention: Discussed safety belts, safety helmets, smoke detector, smoking near bedding or upholstery.   Dental health: Discussed importance of regular tooth brushing, flossing, and dental visits.   Follow up plan: NEXT PREVENTATIVE PHYSICAL DUE IN 1 YEAR. Return in about 6 months (around 06/23/2024) for HTN, HLD, DM2 FU.

## 2023-12-25 NOTE — Assessment & Plan Note (Signed)
 Labs ordered at visit today.  Will make recommendations based on lab results.

## 2023-12-25 NOTE — Assessment & Plan Note (Signed)
 Chronic, Controlled. On Lisinopril  20mg .   Labs ordered at visit today.  Recommend hom BP checks for monitoring Follow up in 6 months.

## 2023-12-25 NOTE — Assessment & Plan Note (Signed)
 Chronic.  Controlled.  Continue with current medication regimen of Atorvastatin 20mg  daily.  Refills sent today.  Labs ordered today.  Return to clinic in 6 months for reevaluation.  Call sooner if concerns arise.

## 2023-12-26 LAB — HEMOGLOBIN A1C
Est. average glucose Bld gHb Est-mCnc: 146 mg/dL
Hgb A1c MFr Bld: 6.7 % — ABNORMAL HIGH (ref 4.8–5.6)

## 2023-12-26 LAB — CBC WITH DIFFERENTIAL/PLATELET
Basophils Absolute: 0 10*3/uL (ref 0.0–0.2)
Basos: 0 %
EOS (ABSOLUTE): 0.2 10*3/uL (ref 0.0–0.4)
Eos: 2 %
Hematocrit: 37 % — ABNORMAL LOW (ref 37.5–51.0)
Hemoglobin: 13 g/dL (ref 13.0–17.7)
Immature Grans (Abs): 0.1 10*3/uL (ref 0.0–0.1)
Immature Granulocytes: 1 %
Lymphocytes Absolute: 2.3 10*3/uL (ref 0.7–3.1)
Lymphs: 23 %
MCH: 33.5 pg — ABNORMAL HIGH (ref 26.6–33.0)
MCHC: 35.1 g/dL (ref 31.5–35.7)
MCV: 95 fL (ref 79–97)
Monocytes Absolute: 0.5 10*3/uL (ref 0.1–0.9)
Monocytes: 5 %
Neutrophils Absolute: 6.6 10*3/uL (ref 1.4–7.0)
Neutrophils: 69 %
Platelets: 234 10*3/uL (ref 150–450)
RBC: 3.88 x10E6/uL — ABNORMAL LOW (ref 4.14–5.80)
RDW: 12.8 % (ref 11.6–15.4)
WBC: 9.7 10*3/uL (ref 3.4–10.8)

## 2023-12-26 LAB — COMPREHENSIVE METABOLIC PANEL
ALT: 20 [IU]/L (ref 0–44)
AST: 21 [IU]/L (ref 0–40)
Albumin: 4.2 g/dL (ref 3.8–4.9)
Alkaline Phosphatase: 63 [IU]/L (ref 44–121)
BUN/Creatinine Ratio: 20 (ref 10–24)
BUN: 25 mg/dL (ref 8–27)
Bilirubin Total: 0.6 mg/dL (ref 0.0–1.2)
CO2: 24 mmol/L (ref 20–29)
Calcium: 9.1 mg/dL (ref 8.6–10.2)
Chloride: 101 mmol/L (ref 96–106)
Creatinine, Ser: 1.27 mg/dL (ref 0.76–1.27)
Globulin, Total: 2.1 g/dL (ref 1.5–4.5)
Glucose: 119 mg/dL — ABNORMAL HIGH (ref 70–99)
Potassium: 4.5 mmol/L (ref 3.5–5.2)
Sodium: 139 mmol/L (ref 134–144)
Total Protein: 6.3 g/dL (ref 6.0–8.5)
eGFR: 65 mL/min/{1.73_m2} (ref >59)

## 2023-12-26 LAB — LIPID PANEL
Chol/HDL Ratio: 5.6 {ratio} — ABNORMAL HIGH (ref 0.0–5.0)
Cholesterol, Total: 186 mg/dL (ref 100–199)
HDL: 33 mg/dL — ABNORMAL LOW (ref >39)
LDL Chol Calc (NIH): 118 mg/dL — ABNORMAL HIGH (ref 0–99)
Triglycerides: 199 mg/dL — ABNORMAL HIGH (ref 0–149)
VLDL Cholesterol Cal: 35 mg/dL (ref 5–40)

## 2023-12-26 LAB — PSA: Prostate Specific Ag, Serum: 3.9 ng/mL (ref 0.0–4.0)

## 2023-12-26 LAB — TSH: TSH: 1.27 u[IU]/mL (ref 0.450–4.500)

## 2024-03-10 DIAGNOSIS — E113592 Type 2 diabetes mellitus with proliferative diabetic retinopathy without macular edema, left eye: Secondary | ICD-10-CM | POA: Diagnosis not present

## 2024-03-10 DIAGNOSIS — E113291 Type 2 diabetes mellitus with mild nonproliferative diabetic retinopathy without macular edema, right eye: Secondary | ICD-10-CM | POA: Diagnosis not present

## 2024-03-10 DIAGNOSIS — H4312 Vitreous hemorrhage, left eye: Secondary | ICD-10-CM | POA: Diagnosis not present

## 2024-04-20 DIAGNOSIS — H4312 Vitreous hemorrhage, left eye: Secondary | ICD-10-CM | POA: Diagnosis not present

## 2024-04-20 DIAGNOSIS — E113592 Type 2 diabetes mellitus with proliferative diabetic retinopathy without macular edema, left eye: Secondary | ICD-10-CM | POA: Diagnosis not present

## 2024-04-20 DIAGNOSIS — E113593 Type 2 diabetes mellitus with proliferative diabetic retinopathy without macular edema, bilateral: Secondary | ICD-10-CM | POA: Diagnosis not present

## 2024-05-08 DIAGNOSIS — Z89421 Acquired absence of other right toe(s): Secondary | ICD-10-CM | POA: Diagnosis not present

## 2024-05-08 DIAGNOSIS — B351 Tinea unguium: Secondary | ICD-10-CM | POA: Diagnosis not present

## 2024-05-08 DIAGNOSIS — E1142 Type 2 diabetes mellitus with diabetic polyneuropathy: Secondary | ICD-10-CM | POA: Diagnosis not present

## 2024-05-08 DIAGNOSIS — Z89432 Acquired absence of left foot: Secondary | ICD-10-CM | POA: Diagnosis not present

## 2024-05-26 DIAGNOSIS — E113592 Type 2 diabetes mellitus with proliferative diabetic retinopathy without macular edema, left eye: Secondary | ICD-10-CM | POA: Diagnosis not present

## 2024-06-17 DIAGNOSIS — E113592 Type 2 diabetes mellitus with proliferative diabetic retinopathy without macular edema, left eye: Secondary | ICD-10-CM | POA: Diagnosis not present

## 2024-06-17 DIAGNOSIS — H4312 Vitreous hemorrhage, left eye: Secondary | ICD-10-CM | POA: Diagnosis not present

## 2024-06-23 ENCOUNTER — Encounter: Payer: Self-pay | Admitting: Nurse Practitioner

## 2024-06-23 ENCOUNTER — Ambulatory Visit (INDEPENDENT_AMBULATORY_CARE_PROVIDER_SITE_OTHER): Payer: Self-pay | Admitting: Nurse Practitioner

## 2024-06-23 VITALS — BP 128/80 | HR 75 | Temp 98.2°F | Wt 249.2 lb

## 2024-06-23 DIAGNOSIS — Z794 Long term (current) use of insulin: Secondary | ICD-10-CM

## 2024-06-23 DIAGNOSIS — D509 Iron deficiency anemia, unspecified: Secondary | ICD-10-CM

## 2024-06-23 DIAGNOSIS — E119 Type 2 diabetes mellitus without complications: Secondary | ICD-10-CM | POA: Diagnosis not present

## 2024-06-23 DIAGNOSIS — Z7984 Long term (current) use of oral hypoglycemic drugs: Secondary | ICD-10-CM | POA: Diagnosis not present

## 2024-06-23 DIAGNOSIS — E785 Hyperlipidemia, unspecified: Secondary | ICD-10-CM

## 2024-06-23 DIAGNOSIS — I10 Essential (primary) hypertension: Secondary | ICD-10-CM | POA: Diagnosis not present

## 2024-06-23 DIAGNOSIS — E1169 Type 2 diabetes mellitus with other specified complication: Secondary | ICD-10-CM | POA: Diagnosis not present

## 2024-06-23 NOTE — Assessment & Plan Note (Signed)
 Chronic.  Controlled.  Continue with current medication regimen.  Labs ordered today.  Return to clinic in 6 months for reevaluation.  Call sooner if concerns arise.  ? ?

## 2024-06-23 NOTE — Assessment & Plan Note (Signed)
 Chronic.  Controlled.  Last A1c was 6.7%.  Continue with current medication regimen of Glipizide  and Metformin .  If elevated can add GLP1. Discussed with patient during visit.   Eye exam up to date.  Labs ordered today.  Return to clinic in 6 months for reevaluation.  Call sooner if concerns arise.

## 2024-06-23 NOTE — Assessment & Plan Note (Signed)
 Chronic, Controlled. On Lisinopril  20mg .   Labs ordered at visit today.  Recommend hom BP checks for monitoring Follow up in 6 months.

## 2024-06-23 NOTE — Progress Notes (Unsigned)
 BP 128/80   Pulse 75   Temp 98.2 F (36.8 C) (Oral)   Wt 249 lb 3.2 oz (113 kg)   SpO2 98%   BMI 36.49 kg/m    Subjective:    Patient ID: [name], male    DOB: Jan 11, 1961, 63 y.o.   MRN: [id number]  HPI: [name] is a 63 y.o. male  Chief Complaint  Patient presents with   Diabetes   HYPERLIPIDEMIA Hyperlipidemia status: excellent compliance Satisfied with current treatment?  no Side effects:  no Medication compliance: excellent compliance Past cholesterol meds: atorvastain (lipitor) Supplements: none Aspirin:  no The 10-year ASCVD risk score (Arnett DK, et al., 2019) is: 18.4%   Values used to calculate the score:     Age: 21 years     Sex: Male     Is Non-Hispanic African American: No     Diabetic: Yes     Tobacco smoker: No     Systolic Blood Pressure: 117 mmHg     Is BP treated: Yes     HDL Cholesterol: 31 mg/dL     Total Cholesterol: 162 mg/dL Chest pain:  no Coronary artery disease:  no Family history CAD:  no Family history early CAD:  no  Denies HA, CP, SOB, dizziness, palpitations, visual changes, and lower extremity swelling.   DIABETES Hypoglycemic episodes:no Polydipsia/polyuria: no Visual disturbance: no Chest pain: no Paresthesias: yes Glucose Monitoring: yes  Accucheck frequency: sometimes  Fasting glucose: 140  Post prandial:  Evening:  Before meals: Taking Insulin ?: yes  Long acting insulin :  Short acting insulin : Blood Pressure Monitoring: not checking Retinal Examination: Up to Date- need to request from [facility] Foot Exam: Up to Date Diabetic Education: Not Completed Pneumovax: Up to Date Influenza: Not up to Date Aspirin: yes  Relevant past medical, surgical, family and social history reviewed and updated as indicated. Interim medical history since our last visit reviewed. Allergies and medications reviewed and updated.  Review of Systems  Eyes:  Negative for visual disturbance.  Respiratory:  Negative for  chest tightness and shortness of breath.   Cardiovascular:  Negative for chest pain, palpitations and leg swelling.  Endocrine: Negative for polydipsia and polyuria.  Neurological:  Negative for dizziness, light-headedness, numbness and headaches.    Per HPI unless specifically indicated above     Objective:    BP 128/80   Pulse 75   Temp 98.2 F (36.8 C) (Oral)   Wt 249 lb 3.2 oz (113 kg)   SpO2 98%   BMI 36.49 kg/m   Wt Readings from Last 3 Encounters:  06/23/24 249 lb 3.2 oz (113 kg)  12/25/23 252 lb 6.4 oz (114.5 kg)  07/01/23 255 lb (115.7 kg)    Physical Exam Vitals and nursing note reviewed.  Constitutional:      General: He is not in acute distress.    Appearance: Normal appearance. He is obese. He is not ill-appearing, toxic-appearing or diaphoretic.  HENT:     Head: Normocephalic.     Right Ear: External ear normal.     Left Ear: External ear normal.     Nose: Nose normal. No congestion or rhinorrhea.     Mouth/Throat:     Mouth: Mucous membranes are moist.  Eyes:     General:        Right eye: No discharge.        Left eye: No discharge.     Extraocular Movements: Extraocular movements intact.  Conjunctiva/sclera: Conjunctivae normal.     Pupils: Pupils are equal, round, and reactive to light.  Cardiovascular:     Rate and Rhythm: Normal rate and regular rhythm.     Heart sounds: No murmur heard. Pulmonary:     Effort: Pulmonary effort is normal. No respiratory distress.     Breath sounds: Normal breath sounds. No wheezing, rhonchi or rales.  Abdominal:     General: Abdomen is flat. Bowel sounds are normal.  Musculoskeletal:     Cervical back: Normal range of motion and neck supple.  Skin:    General: Skin is warm and dry.     Capillary Refill: Capillary refill takes less than 2 seconds.  Neurological:     General: No focal deficit present.     Mental Status: He is alert and oriented to person, place, and time.  Psychiatric:        Mood and  Affect: Mood normal.        Behavior: Behavior normal.        Thought Content: Thought content normal.        Judgment: Judgment normal.     Results for orders placed or performed in visit on 01/07/24  [facility] DIABETES EYE EXAM   Collection Time: 11/11/23 10:02 AM  Result Value Ref Range   [facility] Diabetic Eye Exam Retinopathy (A) No Retinopathy      Assessment & Plan:   Problem List Items Addressed This Visit       Cardiovascular and Mediastinum   Essential hypertension   Chronic, Controlled. On Lisinopril  20mg .   Labs ordered at visit today.  Recommend hom BP checks for monitoring Follow up in 6 months.       Relevant Orders   Comprehensive metabolic panel with GFR     Endocrine   Hyperlipidemia associated with type 2 diabetes mellitus (HCC)   Chronic.  Controlled.  Continue with current medication regimen of Atorvastatin  20mg  daily.  Refills sent today.  Labs ordered today.  Return to clinic in 6 months for reevaluation.  Call sooner if concerns arise.       Relevant Orders   Lipid panel   Diabetes mellitus treated with insulin  and oral medication (HCC)   Chronic.  Controlled.  Last A1c was 6.7%.  Continue with current medication regimen of Glipizide  and Metformin .  If elevated can add GLP1. Discussed with patient during visit.   Eye exam up to date.  Labs ordered today.  Return to clinic in 6 months for reevaluation.  Call sooner if concerns arise.       Relevant Orders   Hemoglobin A1c     Other   Iron deficiency anemia - Primary   Chronic.  Controlled.  Continue with current medication regimen.  Labs ordered today.  Return to clinic in 6 months for reevaluation.  Call sooner if concerns arise.        Relevant Orders   CBC With Diff/Platelet     Follow up plan: Return in about 3 months (around 09/23/2024) for HTN, HLD, DM2 FU.

## 2024-06-23 NOTE — Assessment & Plan Note (Signed)
 Chronic.  Controlled.  Continue with current medication regimen of Atorvastatin 20mg  daily.  Refills sent today.  Labs ordered today.  Return to clinic in 6 months for reevaluation.  Call sooner if concerns arise.

## 2024-06-24 ENCOUNTER — Ambulatory Visit: Payer: Self-pay | Admitting: Nurse Practitioner

## 2024-06-24 DIAGNOSIS — N1832 Chronic kidney disease, stage 3b: Secondary | ICD-10-CM

## 2024-06-24 LAB — COMPREHENSIVE METABOLIC PANEL WITH GFR
ALT: 17 IU/L (ref 0–44)
AST: 18 IU/L (ref 0–40)
Albumin: 4.4 g/dL (ref 3.9–4.9)
Alkaline Phosphatase: 56 IU/L (ref 44–121)
BUN/Creatinine Ratio: 20 (ref 10–24)
BUN: 33 mg/dL — ABNORMAL HIGH (ref 8–27)
Bilirubin Total: 0.7 mg/dL (ref 0.0–1.2)
CO2: 22 mmol/L (ref 20–29)
Calcium: 9.3 mg/dL (ref 8.6–10.2)
Chloride: 101 mmol/L (ref 96–106)
Creatinine, Ser: 1.61 mg/dL — ABNORMAL HIGH (ref 0.76–1.27)
Globulin, Total: 2.3 g/dL (ref 1.5–4.5)
Glucose: 85 mg/dL (ref 70–99)
Potassium: 4.8 mmol/L (ref 3.5–5.2)
Sodium: 139 mmol/L (ref 134–144)
Total Protein: 6.7 g/dL (ref 6.0–8.5)
eGFR: 48 mL/min/1.73 — ABNORMAL LOW (ref >59)

## 2024-06-24 LAB — CBC WITH DIFF/PLATELET
Basophils Absolute: 0.1 x10E3/uL (ref 0.0–0.2)
Basos: 1 %
EOS (ABSOLUTE): 0.2 x10E3/uL (ref 0.0–0.4)
Eos: 2 %
Hematocrit: 37.1 % — ABNORMAL LOW (ref 37.5–51.0)
Hemoglobin: 12.2 g/dL — ABNORMAL LOW (ref 13.0–17.7)
Immature Grans (Abs): 0.1 x10E3/uL (ref 0.0–0.1)
Immature Granulocytes: 1 %
Lymphocytes Absolute: 2.5 x10E3/uL (ref 0.7–3.1)
Lymphs: 28 %
MCH: 31.8 pg (ref 26.6–33.0)
MCHC: 32.9 g/dL (ref 31.5–35.7)
MCV: 97 fL (ref 79–97)
Monocytes Absolute: 0.6 x10E3/uL (ref 0.1–0.9)
Monocytes: 7 %
Neutrophils Absolute: 5.5 x10E3/uL (ref 1.4–7.0)
Neutrophils: 61 %
Platelets: 206 x10E3/uL (ref 150–450)
RBC: 3.84 x10E6/uL — ABNORMAL LOW (ref 4.14–5.80)
RDW: 12.5 % (ref 11.6–15.4)
WBC: 8.8 x10E3/uL (ref 3.4–10.8)

## 2024-06-24 LAB — LIPID PANEL
Chol/HDL Ratio: 5.3 ratio — ABNORMAL HIGH (ref 0.0–5.0)
Cholesterol, Total: 155 mg/dL (ref 100–199)
HDL: 29 mg/dL — ABNORMAL LOW (ref >39)
LDL Chol Calc (NIH): 87 mg/dL (ref 0–99)
Triglycerides: 229 mg/dL — ABNORMAL HIGH (ref 0–149)
VLDL Cholesterol Cal: 39 mg/dL (ref 5–40)

## 2024-06-24 LAB — HEMOGLOBIN A1C
Est. average glucose Bld gHb Est-mCnc: 180 mg/dL
Hgb A1c MFr Bld: 7.9 % — ABNORMAL HIGH (ref 4.8–5.6)

## 2024-06-30 ENCOUNTER — Other Ambulatory Visit

## 2024-06-30 DIAGNOSIS — N1832 Chronic kidney disease, stage 3b: Secondary | ICD-10-CM

## 2024-07-21 DIAGNOSIS — E113592 Type 2 diabetes mellitus with proliferative diabetic retinopathy without macular edema, left eye: Secondary | ICD-10-CM | POA: Diagnosis not present

## 2024-08-12 DIAGNOSIS — E1142 Type 2 diabetes mellitus with diabetic polyneuropathy: Secondary | ICD-10-CM | POA: Diagnosis not present

## 2024-08-12 DIAGNOSIS — Z89421 Acquired absence of other right toe(s): Secondary | ICD-10-CM | POA: Diagnosis not present

## 2024-08-12 DIAGNOSIS — B351 Tinea unguium: Secondary | ICD-10-CM | POA: Diagnosis not present

## 2024-08-12 DIAGNOSIS — Z89432 Acquired absence of left foot: Secondary | ICD-10-CM | POA: Diagnosis not present

## 2024-09-21 DIAGNOSIS — H4312 Vitreous hemorrhage, left eye: Secondary | ICD-10-CM | POA: Diagnosis not present

## 2024-09-21 DIAGNOSIS — E113291 Type 2 diabetes mellitus with mild nonproliferative diabetic retinopathy without macular edema, right eye: Secondary | ICD-10-CM | POA: Diagnosis not present

## 2024-09-21 DIAGNOSIS — Z961 Presence of intraocular lens: Secondary | ICD-10-CM | POA: Diagnosis not present

## 2024-09-21 DIAGNOSIS — E113592 Type 2 diabetes mellitus with proliferative diabetic retinopathy without macular edema, left eye: Secondary | ICD-10-CM | POA: Diagnosis not present

## 2024-09-28 ENCOUNTER — Ambulatory Visit (INDEPENDENT_AMBULATORY_CARE_PROVIDER_SITE_OTHER): Admitting: Nurse Practitioner

## 2024-09-28 ENCOUNTER — Encounter: Payer: Self-pay | Admitting: Nurse Practitioner

## 2024-09-28 VITALS — BP 133/74 | HR 81 | Temp 98.4°F | Ht 69.1 in | Wt 250.0 lb

## 2024-09-28 DIAGNOSIS — D509 Iron deficiency anemia, unspecified: Secondary | ICD-10-CM

## 2024-09-28 DIAGNOSIS — E785 Hyperlipidemia, unspecified: Secondary | ICD-10-CM

## 2024-09-28 DIAGNOSIS — I10 Essential (primary) hypertension: Secondary | ICD-10-CM | POA: Diagnosis not present

## 2024-09-28 DIAGNOSIS — Z7984 Long term (current) use of oral hypoglycemic drugs: Secondary | ICD-10-CM

## 2024-09-28 DIAGNOSIS — E1169 Type 2 diabetes mellitus with other specified complication: Secondary | ICD-10-CM

## 2024-09-28 DIAGNOSIS — Z794 Long term (current) use of insulin: Secondary | ICD-10-CM

## 2024-09-28 DIAGNOSIS — E119 Type 2 diabetes mellitus without complications: Secondary | ICD-10-CM | POA: Diagnosis not present

## 2024-09-28 NOTE — Assessment & Plan Note (Signed)
 Chronic.  Controlled.  Continue with current medication regimen.  Labs ordered today.  Return to clinic in 6 months for reevaluation.  Call sooner if concerns arise.  ? ?

## 2024-09-28 NOTE — Progress Notes (Signed)
 BP 133/74 (BP Location: Left Arm, Cuff Size: Normal)   Pulse 81   Temp 98.4 F (36.9 C) (Oral)   Ht 5' 9.1 (1.755 m)   Wt 250 lb (113.4 kg)   SpO2 97%   BMI 36.81 kg/m    Subjective:    Patient ID: [name], male    DOB: 1961-07-19, 63 y.o.   MRN: [id number]  HPI: [name] is a 63 y.o. male  Chief Complaint  Patient presents with   Diabetes   Hyperlipidemia   Hypertension   HYPERLIPIDEMIA Hyperlipidemia status: excellent compliance Satisfied with current treatment?  no Side effects:  no Medication compliance: excellent compliance Past cholesterol meds: atorvastain (lipitor) Supplements: none Aspirin:  no The 10-year ASCVD risk score (Arnett DK, et al., 2019) is: 18.4%   Values used to calculate the score:     Age: 31 years     Sex: Male     Is Non-Hispanic African American: No     Diabetic: Yes     Tobacco smoker: No     Systolic Blood Pressure: 117 mmHg     Is BP treated: Yes     HDL Cholesterol: 31 mg/dL     Total Cholesterol: 162 mg/dL Chest pain:  no Coronary artery disease:  no Family history CAD:  no Family history early CAD:  no  Denies HA, CP, SOB, dizziness, palpitations, visual changes, and lower extremity swelling.   DIABETES Hypoglycemic episodes:no Polydipsia/polyuria: no Visual disturbance: no Chest pain: no Paresthesias: yes Glucose Monitoring: yes  Accucheck frequency: sometimes  Fasting glucose: 125-150  Post prandial: 210-220  Evening:  Before meals: Taking Insulin ?: yes  Long acting insulin :  Short acting insulin : Blood Pressure Monitoring: not checking Retinal Examination: Up to Date- need to request from [place] [facility] Foot Exam: Up to Date Diabetic Education: Not Completed Pneumovax: Up to Date Influenza: Not up to Date Aspirin: yes  ANEMIA Anemia status: controlled Etiology of anemia: Duration of anemia treatment:  Compliance with treatment: excellent compliance Iron supplementation side effects:  no Severity of anemia: mild Fatigue: no Decreased exercise tolerance: no  Dyspnea on exertion: no Palpitations: no Bleeding: no Pica: no  Relevant past medical, surgical, family and social history reviewed and updated as indicated. Interim medical history since our last visit reviewed. Allergies and medications reviewed and updated.  Review of Systems  Constitutional:  Negative for fatigue.  Eyes:  Negative for visual disturbance.  Respiratory:  Negative for chest tightness and shortness of breath.   Cardiovascular:  Negative for chest pain, palpitations and leg swelling.  Endocrine: Negative for polydipsia and polyuria.  Neurological:  Negative for dizziness, light-headedness, numbness and headaches.    Per HPI unless specifically indicated above     Objective:    BP 133/74 (BP Location: Left Arm, Cuff Size: Normal)   Pulse 81   Temp 98.4 F (36.9 C) (Oral)   Ht 5' 9.1 (1.755 m)   Wt 250 lb (113.4 kg)   SpO2 97%   BMI 36.81 kg/m   Wt Readings from Last 3 Encounters:  09/28/24 250 lb (113.4 kg)  06/23/24 249 lb 3.2 oz (113 kg)  12/25/23 252 lb 6.4 oz (114.5 kg)    Physical Exam Vitals and nursing note reviewed.  Constitutional:      General: He is not in acute distress.    Appearance: Normal appearance. He is obese. He is not ill-appearing, toxic-appearing or diaphoretic.  HENT:     Head: Normocephalic.  Right Ear: External ear normal.     Left Ear: External ear normal.     Nose: Nose normal. No congestion or rhinorrhea.     Mouth/Throat:     Mouth: Mucous membranes are moist.  Eyes:     General:        Right eye: No discharge.        Left eye: No discharge.     Extraocular Movements: Extraocular movements intact.     Conjunctiva/sclera: Conjunctivae normal.     Pupils: Pupils are equal, round, and reactive to light.  Cardiovascular:     Rate and Rhythm: Normal rate and regular rhythm.     Heart sounds: No murmur heard. Pulmonary:     Effort:  Pulmonary effort is normal. No respiratory distress.     Breath sounds: Normal breath sounds. No wheezing, rhonchi or rales.  Abdominal:     General: Abdomen is flat. Bowel sounds are normal.  Musculoskeletal:     Cervical back: Normal range of motion and neck supple.  Skin:    General: Skin is warm and dry.     Capillary Refill: Capillary refill takes less than 2 seconds.  Neurological:     General: No focal deficit present.     Mental Status: He is alert and oriented to person, place, and time.  Psychiatric:        Mood and Affect: Mood normal.        Behavior: Behavior normal.        Thought Content: Thought content normal.        Judgment: Judgment normal.     Results for orders placed or performed in visit on 06/23/24  CBC With Diff/Platelet   Collection Time: 06/23/24  3:38 PM  Result Value Ref Range   WBC 8.8 3.4 - 10.8 x10E3/uL   RBC 3.84 (L) 4.14 - 5.80 x10E6/uL   Hemoglobin 12.2 (L) 13.0 - 17.7 g/dL   Hematocrit 62.8 (L) 62.4 - 51.0 %   MCV 97 79 - 97 fL   MCH 31.8 26.6 - 33.0 pg   MCHC 32.9 31.5 - 35.7 g/dL   RDW 87.4 88.3 - 84.5 %   Platelets 206 150 - 450 x10E3/uL   Neutrophils 61 Not Estab. %   Lymphs 28 Not Estab. %   Monocytes 7 Not Estab. %   Eos 2 Not Estab. %   Basos 1 Not Estab. %   Neutrophils Absolute 5.5 1.4 - 7.0 x10E3/uL   Lymphocytes Absolute 2.5 0.7 - 3.1 x10E3/uL   Monocytes Absolute 0.6 0.1 - 0.9 x10E3/uL   EOS (ABSOLUTE) 0.2 0.0 - 0.4 x10E3/uL   Basophils Absolute 0.1 0.0 - 0.2 x10E3/uL   Immature Granulocytes 1 Not Estab. %   Immature Grans (Abs) 0.1 0.0 - 0.1 x10E3/uL  Hemoglobin A1c   Collection Time: 06/23/24  3:38 PM  Result Value Ref Range   Hgb A1c MFr Bld 7.9 (H) 4.8 - 5.6 %   Est. average glucose Bld gHb Est-mCnc 180 mg/dL  Comprehensive metabolic panel with GFR   Collection Time: 06/23/24  3:38 PM  Result Value Ref Range   Glucose 85 70 - 99 mg/dL   BUN 33 (H) 8 - 27 mg/dL   Creatinine, Ser 8.38 (H) 0.76 - 1.27 mg/dL    eGFR 48 (L) >40 fO/fpw/8.26   BUN/Creatinine Ratio 20 10 - 24   Sodium 139 134 - 144 mmol/L   Potassium 4.8 3.5 - 5.2 mmol/L   Chloride 101 96 -  106 mmol/L   CO2 22 20 - 29 mmol/L   Calcium  9.3 8.6 - 10.2 mg/dL   Total Protein 6.7 6.0 - 8.5 g/dL   Albumin 4.4 3.9 - 4.9 g/dL   Globulin, Total 2.3 1.5 - 4.5 g/dL   Bilirubin Total 0.7 0.0 - 1.2 mg/dL   Alkaline Phosphatase 56 44 - 121 IU/L   AST 18 0 - 40 IU/L   ALT 17 0 - 44 IU/L  Lipid panel   Collection Time: 06/23/24  3:38 PM  Result Value Ref Range   Cholesterol, Total 155 100 - 199 mg/dL   Triglycerides 770 (H) 0 - 149 mg/dL   HDL 29 (L) >60 mg/dL   VLDL Cholesterol Cal 39 5 - 40 mg/dL   LDL Chol Calc (NIH) 87 0 - 99 mg/dL   Chol/HDL Ratio 5.3 (H) 0.0 - 5.0 ratio      Assessment & Plan:   Problem List Items Addressed This Visit       Cardiovascular and Mediastinum   Essential hypertension   Chronic, Controlled. On Lisinopril  20mg .   Labs ordered at visit today.  Recommend hom BP checks for monitoring and bring log to next visit. Follow up in 6 months.       Relevant Orders   Comprehensive metabolic panel with GFR     Endocrine   Hyperlipidemia associated with type 2 diabetes mellitus (HCC)   Chronic.  Controlled.  Continue with current medication regimen of Atorvastatin  20mg  daily.  Refills sent today.  Labs ordered today.  Return to clinic in 6 months for reevaluation.  Call sooner if concerns arise.       Relevant Orders   Lipid panel   Diabetes mellitus treated with insulin  and oral medication (HCC) - Primary   Chronic.  Controlled.  Last A1c was 7.9%.  Continue with current medication regimen of Glipizide  and Metformin .  If elevated can add GLP1. Discussed with patient during visit.   Eye exam up to date.  Labs ordered today.  Return to clinic in 6 months for reevaluation.  Call sooner if concerns arise.  Followed by Podiatry.  Last saw specialist in September.      Relevant Orders   Hemoglobin A1c      Other   Iron deficiency anemia   Chronic.  Controlled.  Continue with current medication regimen.  Labs ordered today.  Return to clinic in 6 months for reevaluation.  Call sooner if concerns arise.        Relevant Orders   CBC With Diff/Platelet   Iron Binding Cap (TIBC)([facility])     Follow up plan: Return in about 3 months (around 12/29/2024) for HTN, HLD, DM2 FU.

## 2024-09-28 NOTE — Assessment & Plan Note (Signed)
 Chronic.  Controlled.  Continue with current medication regimen of Atorvastatin 20mg  daily.  Refills sent today.  Labs ordered today.  Return to clinic in 6 months for reevaluation.  Call sooner if concerns arise.

## 2024-09-28 NOTE — Assessment & Plan Note (Signed)
 Chronic, Controlled. On Lisinopril  20mg .   Labs ordered at visit today.  Recommend hom BP checks for monitoring and bring log to next visit. Follow up in 6 months.

## 2024-09-28 NOTE — Assessment & Plan Note (Addendum)
 Chronic.  Controlled.  Last A1c was 7.9%.  Continue with current medication regimen of Glipizide  and Metformin .  If elevated can add GLP1. Discussed with patient during visit.   Eye exam up to date.  Labs ordered today.  Return to clinic in 6 months for reevaluation.  Call sooner if concerns arise.  Followed by Podiatry.  Last saw specialist in September.

## 2024-09-29 ENCOUNTER — Ambulatory Visit: Payer: Self-pay | Admitting: Nurse Practitioner

## 2024-09-29 LAB — CBC WITH DIFF/PLATELET
Basophils Absolute: 0 x10E3/uL (ref 0.0–0.2)
Basos: 0 %
EOS (ABSOLUTE): 0.3 x10E3/uL (ref 0.0–0.4)
Eos: 4 %
Hematocrit: 40.4 % (ref 37.5–51.0)
Hemoglobin: 13.4 g/dL (ref 13.0–17.7)
Immature Grans (Abs): 0.1 x10E3/uL (ref 0.0–0.1)
Immature Granulocytes: 1 %
Lymphocytes Absolute: 2.1 x10E3/uL (ref 0.7–3.1)
Lymphs: 23 %
MCH: 32.5 pg (ref 26.6–33.0)
MCHC: 33.2 g/dL (ref 31.5–35.7)
MCV: 98 fL — ABNORMAL HIGH (ref 79–97)
Monocytes Absolute: 0.6 x10E3/uL (ref 0.1–0.9)
Monocytes: 7 %
Neutrophils Absolute: 6.1 x10E3/uL (ref 1.4–7.0)
Neutrophils: 65 %
Platelets: 206 x10E3/uL (ref 150–450)
RBC: 4.12 x10E6/uL — ABNORMAL LOW (ref 4.14–5.80)
RDW: 12.5 % (ref 11.6–15.4)
WBC: 9.3 x10E3/uL (ref 3.4–10.8)

## 2024-09-29 LAB — COMPREHENSIVE METABOLIC PANEL WITH GFR
ALT: 24 IU/L (ref 0–44)
AST: 27 IU/L (ref 0–40)
Albumin: 4.4 g/dL (ref 3.9–4.9)
Alkaline Phosphatase: 64 IU/L (ref 47–123)
BUN/Creatinine Ratio: 22 (ref 10–24)
BUN: 34 mg/dL — ABNORMAL HIGH (ref 8–27)
Bilirubin Total: 0.7 mg/dL (ref 0.0–1.2)
CO2: 23 mmol/L (ref 20–29)
Calcium: 9.1 mg/dL (ref 8.6–10.2)
Chloride: 98 mmol/L (ref 96–106)
Creatinine, Ser: 1.55 mg/dL — ABNORMAL HIGH (ref 0.76–1.27)
Globulin, Total: 2.2 g/dL (ref 1.5–4.5)
Glucose: 97 mg/dL (ref 70–99)
Potassium: 4.7 mmol/L (ref 3.5–5.2)
Sodium: 137 mmol/L (ref 134–144)
Total Protein: 6.6 g/dL (ref 6.0–8.5)
eGFR: 51 mL/min/1.73 — ABNORMAL LOW (ref >59)

## 2024-09-29 LAB — LIPID PANEL
Chol/HDL Ratio: 5.4 ratio — ABNORMAL HIGH (ref 0.0–5.0)
Cholesterol, Total: 162 mg/dL (ref 100–199)
HDL: 30 mg/dL — ABNORMAL LOW (ref >39)
LDL Chol Calc (NIH): 98 mg/dL (ref 0–99)
Triglycerides: 195 mg/dL — ABNORMAL HIGH (ref 0–149)
VLDL Cholesterol Cal: 34 mg/dL (ref 5–40)

## 2024-09-29 LAB — IRON AND TIBC
Iron Saturation: 47 % (ref 15–55)
Iron: 116 ug/dL (ref 38–169)
Total Iron Binding Capacity: 245 ug/dL — ABNORMAL LOW (ref 250–450)
UIBC: 129 ug/dL (ref 111–343)

## 2024-09-29 LAB — HEMOGLOBIN A1C
Est. average glucose Bld gHb Est-mCnc: 148 mg/dL
Hgb A1c MFr Bld: 6.8 % — ABNORMAL HIGH (ref 4.8–5.6)

## 2024-10-09 ENCOUNTER — Other Ambulatory Visit: Payer: Self-pay | Admitting: Nurse Practitioner

## 2024-10-09 DIAGNOSIS — E1140 Type 2 diabetes mellitus with diabetic neuropathy, unspecified: Secondary | ICD-10-CM

## 2024-10-10 NOTE — Telephone Encounter (Signed)
 Requested Prescriptions  Pending Prescriptions Disp Refills   glipiZIDE  (GLUCOTROL ) 5 MG tablet [Pharmacy Med Name: glipiZIDE  5 MG Oral Tablet] 180 tablet 0    Sig: TAKE 1 TABLET BY MOUTH TWICE DAILY BEFORE A MEAL     Endocrinology:  Diabetes - Sulfonylureas Failed - 10/10/2024  6:05 PM      Failed - Cr in normal range and within 360 days    Creatinine, Ser  Date Value Ref Range Status  09/28/2024 1.55 (H) 0.76 - 1.27 mg/dL Final         Passed - HBA1C is between 0 and 7.9 and within 180 days    HB A1C (BAYER DCA - WAIVED)  Date Value Ref Range Status  05/23/2020 7.3 (H) <7.0 % Final    Comment:                                          Diabetic Adult            <7.0                                       Healthy Adult        4.3 - 5.7                                                           (DCCT/NGSP) American Diabetes Association's Summary of Glycemic Recommendations for Adults with Diabetes: Hemoglobin A1c <7.0%. More stringent glycemic goals (A1c <6.0%) may further reduce complications at the cost of increased risk of hypoglycemia.    Hgb A1c MFr Bld  Date Value Ref Range Status  09/28/2024 6.8 (H) 4.8 - 5.6 % Final    Comment:             Prediabetes: 5.7 - 6.4          Diabetes: >6.4          Glycemic control for adults with diabetes: <7.0          Passed - Valid encounter within last 6 months    Recent Outpatient Visits           1 week ago Diabetes mellitus treated with insulin  and oral medication (HCC)   [place] [facility] Melvin Pao, NP   3 months ago Iron deficiency anemia, unspecified iron deficiency anemia type   [place] [facility] Melvin Pao, NP               metFORMIN  (GLUCOPHAGE ) 1000 MG tablet [Pharmacy Med Name: metFORMIN  HCl 1000 MG Oral Tablet] 180 tablet 0    Sig: TAKE 1 TABLET BY MOUTH TWICE DAILY WITH MEALS     Endocrinology:  Diabetes - Biguanides Failed - 10/10/2024  6:05 PM      Failed -  Cr in normal range and within 360 days    Creatinine, Ser  Date Value Ref Range Status  09/28/2024 1.55 (H) 0.76 - 1.27 mg/dL Final         Failed - eGFR in normal range and within 360 days    GFR calc Af [place]  Date  Value Ref Range Status  11/17/2020 118 >59 mL/min/1.73 Final    Comment:    **In accordance with recommendations from the NKF-ASN Task force,**   [facility] is in the process of updating its eGFR calculation to the   2021 CKD-EPI creatinine equation that estimates kidney function   without a race variable.    GFR, Estimated  Date Value Ref Range Status  06/26/2022 >60 >60 mL/min Final    Comment:    (NOTE) Calculated using the CKD-EPI Creatinine Equation (2021)    eGFR  Date Value Ref Range Status  09/28/2024 51 (L) >59 mL/min/1.73 Final         Failed - B12 Level in normal range and within 720 days    Vitamin B-12  Date Value Ref Range Status  04/20/2022 238 232 - 1,245 pg/mL Final         Passed - HBA1C is between 0 and 7.9 and within 180 days    HB A1C (BAYER DCA - WAIVED)  Date Value Ref Range Status  05/23/2020 7.3 (H) <7.0 % Final    Comment:                                          Diabetic Adult            <7.0                                       Healthy Adult        4.3 - 5.7                                                           (DCCT/NGSP) American Diabetes Association's Summary of Glycemic Recommendations for Adults with Diabetes: Hemoglobin A1c <7.0%. More stringent glycemic goals (A1c <6.0%) may further reduce complications at the cost of increased risk of hypoglycemia.    Hgb A1c MFr Bld  Date Value Ref Range Status  09/28/2024 6.8 (H) 4.8 - 5.6 % Final    Comment:             Prediabetes: 5.7 - 6.4          Diabetes: >6.4          Glycemic control for adults with diabetes: <7.0          Passed - Valid encounter within last 6 months    Recent Outpatient Visits           1 week ago Diabetes mellitus treated with insulin  and  oral medication [facility])   [place] [facility] Harvel, Darice, NP   3 months ago Iron deficiency anemia, unspecified iron deficiency anemia type   [place] [facility] Melvin Darice, NP              Passed - CBC within normal limits and completed in the last 12 months    WBC  Date Value Ref Range Status  09/28/2024 9.3 3.4 - 10.8 x10E3/uL Final  06/25/2022 9.3 4.0 - 10.5 K/uL Final   RBC  Date Value Ref Range Status  09/28/2024 4.12 (L) 4.14 -  5.80 x10E6/uL Final  06/25/2022 3.67 (L) 4.22 - 5.81 MIL/uL Final   Hemoglobin  Date Value Ref Range Status  09/28/2024 13.4 13.0 - 17.7 g/dL Final   Hematocrit  Date Value Ref Range Status  09/28/2024 40.4 37.5 - 51.0 % Final   MCHC  Date Value Ref Range Status  09/28/2024 33.2 31.5 - 35.7 g/dL Final  92/82/7976 66.6 30.0 - 36.0 g/dL Final   [facility]  Date Value Ref Range Status  09/28/2024 32.5 26.6 - 33.0 pg Final  06/25/2022 30.0 26.0 - 34.0 pg Final   MCV  Date Value Ref Range Status  09/28/2024 98 (H) 79 - 97 fL Final   No results found for: PLTCOUNTKUC, LABPLAT, POCPLA RDW  Date Value Ref Range Status  09/28/2024 12.5 11.6 - 15.4 % Final          lisinopril  (ZESTRIL ) 20 MG tablet [Pharmacy Med Name: Lisinopril  20 MG Oral Tablet] 90 tablet 0    Sig: Take 1 tablet by mouth once daily     Cardiovascular:  ACE Inhibitors Failed - 10/10/2024  6:05 PM      Failed - Cr in normal range and within 180 days    Creatinine, Ser  Date Value Ref Range Status  09/28/2024 1.55 (H) 0.76 - 1.27 mg/dL Final         Passed - K in normal range and within 180 days    Potassium  Date Value Ref Range Status  09/28/2024 4.7 3.5 - 5.2 mmol/L Final         Passed - Patient is not pregnant      Passed - Last BP in normal range    BP Readings from Last 1 Encounters:  09/28/24 133/74         Passed - Valid encounter within last 6 months    Recent Outpatient Visits           1 week ago  Diabetes mellitus treated with insulin  and oral medication [facility])   [place] [facility] Melvin Pao, NP   3 months ago Iron deficiency anemia, unspecified iron deficiency anemia type   [place] [facility] Melvin Pao, NP

## 2024-11-16 DIAGNOSIS — E1142 Type 2 diabetes mellitus with diabetic polyneuropathy: Secondary | ICD-10-CM | POA: Diagnosis not present

## 2024-11-16 DIAGNOSIS — B351 Tinea unguium: Secondary | ICD-10-CM | POA: Diagnosis not present

## 2024-11-16 DIAGNOSIS — L851 Acquired keratosis [keratoderma] palmaris et plantaris: Secondary | ICD-10-CM | POA: Diagnosis not present

## 2024-11-16 DIAGNOSIS — Z89432 Acquired absence of left foot: Secondary | ICD-10-CM | POA: Diagnosis not present

## 2024-12-30 ENCOUNTER — Encounter: Payer: Self-pay | Admitting: Nurse Practitioner

## 2024-12-30 ENCOUNTER — Ambulatory Visit: Admitting: Nurse Practitioner

## 2024-12-30 VITALS — BP 129/73 | HR 76 | Temp 97.6°F | Ht 69.09 in | Wt 248.8 lb

## 2024-12-30 DIAGNOSIS — E119 Type 2 diabetes mellitus without complications: Secondary | ICD-10-CM

## 2024-12-30 DIAGNOSIS — E785 Hyperlipidemia, unspecified: Secondary | ICD-10-CM

## 2024-12-30 DIAGNOSIS — I10 Essential (primary) hypertension: Secondary | ICD-10-CM

## 2024-12-30 DIAGNOSIS — Z7984 Long term (current) use of oral hypoglycemic drugs: Secondary | ICD-10-CM

## 2024-12-30 DIAGNOSIS — N1831 Chronic kidney disease, stage 3a: Secondary | ICD-10-CM

## 2024-12-30 DIAGNOSIS — Z0000 Encounter for general adult medical examination without abnormal findings: Secondary | ICD-10-CM

## 2024-12-30 DIAGNOSIS — E1169 Type 2 diabetes mellitus with other specified complication: Secondary | ICD-10-CM | POA: Diagnosis not present

## 2024-12-30 DIAGNOSIS — D638 Anemia in other chronic diseases classified elsewhere: Secondary | ICD-10-CM | POA: Diagnosis not present

## 2024-12-30 DIAGNOSIS — Z794 Long term (current) use of insulin: Secondary | ICD-10-CM

## 2024-12-30 DIAGNOSIS — E1140 Type 2 diabetes mellitus with diabetic neuropathy, unspecified: Secondary | ICD-10-CM

## 2024-12-30 DIAGNOSIS — Z89432 Acquired absence of left foot: Secondary | ICD-10-CM | POA: Diagnosis not present

## 2024-12-30 LAB — MICROALBUMIN, URINE WAIVED
Creatinine, Urine Waived: 300 mg/dL (ref 10–300)
Microalb, Ur Waived: 150 mg/L — ABNORMAL HIGH (ref 0–19)

## 2024-12-30 MED ORDER — ATORVASTATIN CALCIUM 20 MG PO TABS: 20.0000 mg | ORAL_TABLET | ORAL | 1 refills | Status: AC

## 2024-12-30 MED ORDER — GLIPIZIDE 5 MG PO TABS: 5.0000 mg | ORAL_TABLET | Freq: Two times a day (BID) | ORAL | 1 refills | Status: AC

## 2024-12-30 MED ORDER — LISINOPRIL 20 MG PO TABS: 20.0000 mg | ORAL_TABLET | Freq: Every day | ORAL | 1 refills | Status: AC

## 2024-12-30 MED ORDER — METFORMIN HCL 1000 MG PO TABS: 1000.0000 mg | ORAL_TABLET | Freq: Two times a day (BID) | ORAL | 1 refills | Status: AC

## 2024-12-30 NOTE — Assessment & Plan Note (Signed)
 Chronic, Controlled. On Lisinopril  20mg .   Labs ordered at visit today.  Recommend hom BP checks for monitoring and bring log to next visit. Follow up in 6 months.

## 2024-12-30 NOTE — Assessment & Plan Note (Signed)
 Chronic.  Controlled.  Continue with current medication regimen of Atorvastatin 20mg  daily.  Refills sent today.  Labs ordered today.  Return to clinic in 6 months for reevaluation.  Call sooner if concerns arise.

## 2024-12-30 NOTE — Assessment & Plan Note (Signed)
 Chronic.  Controlled.  Last A1c was 6.8%.  Has made diet changes since last visit.  Sugars running 90-125.  Continue with current medication regimen of Glipizide  and Metformin .  If elevated can add GLP1.  Eye exam up to date.  Labs ordered today.  Return to clinic in 6 months for reevaluation.  Call sooner if concerns arise.  Followed by Podiatry.  Last saw specialist in December.

## 2024-12-30 NOTE — Assessment & Plan Note (Signed)
 Chronic.  Controlled. Continue with Lisinopril  for Kidney protection.    Labs ordered today.  Return to clinic in 6 months for reevaluation.  Call sooner if concerns arise.

## 2024-12-30 NOTE — Assessment & Plan Note (Signed)
 Followed by Podiatry. Last visit was 12/25. Continue to follow up with specialist.

## 2024-12-30 NOTE — Progress Notes (Signed)
 "  BP 129/73 (BP Location: Right Arm, Patient Position: Sitting, Cuff Size: Large)   Pulse 76   Temp 97.6 F (36.4 C) (Oral)   Ht 5' 9.09 (1.755 m)   Wt 248 lb 12.8 oz (112.9 kg)   SpO2 96%   BMI 36.64 kg/m    Subjective:    Patient ID: [name], male    DOB: 1961-04-12, 64 y.o.   MRN: [id number]  HPI: [name] is a 64 y.o. male presenting on 12/30/2024 for comprehensive medical examination. Current medical complaints include:none  He currently lives with: Interim Problems from his last visit: no  HYPERLIPIDEMIA Hyperlipidemia status: excellent compliance Satisfied with current treatment?  no Side effects:  no Medication compliance: excellent compliance Past cholesterol meds: atorvastain (lipitor) Supplements: none Aspirin:  no The 10-year ASCVD risk score (Arnett DK, et al., 2019) is: 23.6%   Values used to calculate the score:     Age: 73 years     Clinically relevant sex: Male     Is Non-Hispanic African American: No     Diabetic: Yes     Tobacco smoker: No     Systolic Blood Pressure: 129 mmHg     Is BP treated: Yes     HDL Cholesterol: 30 mg/dL     Total Cholesterol: 162 mg/dL Chest pain:  no Coronary artery disease:  no Family history CAD:  no Family history early CAD:  no  Denies HA, CP, SOB, dizziness, palpitations, visual changes, and lower extremity swelling.   DIABETES He has really changed his diet.  Cut out zero sugar drinks. Hypoglycemic episodes:no Polydipsia/polyuria: no Visual disturbance: no Chest pain: no Paresthesias: yes Glucose Monitoring: yes  Accucheck frequency: sometimes  Fasting glucose: 90-125/140  Post prandial:  Evening:  Before meals: Taking Insulin ?: yes  Long acting insulin :  Short acting insulin : Blood Pressure Monitoring: not checking Retinal Examination: Up to Date Foot Exam: Up to Date Diabetic Education: Not Completed Pneumovax: Up to Date Influenza: Not up to Date Aspirin: yes   Depression Screen  done today and results listed below:     12/30/2024    3:21 PM 09/28/2024    3:23 PM 06/23/2024    3:29 PM 12/25/2023    3:25 PM 07/01/2023    3:29 PM  Depression screen PHQ 2/9  Decreased Interest 0 0 0 0 0  Down, Depressed, Hopeless 0 0 0 0 0  PHQ - 2 Score 0 0 0 0 0  Altered sleeping 0 0 0 0 0  Tired, decreased energy 0 0 0 0 0  Change in appetite 0 0 0 0 0  Feeling bad or failure about yourself  0 0 0 0 0  Trouble concentrating 0 0 0 0 0  Moving slowly or fidgety/restless 0 0 0 0 0  Suicidal thoughts 0 0 0 0 0  PHQ-9 Score 0 0  0  0  0   Difficult doing work/chores Not difficult at all Not difficult at all Not difficult at all  Not difficult at all     Data saved with a previous flowsheet row definition    The patient does not have a history of falls. I did complete a risk assessment for falls. A plan of care for falls was documented.   Past Medical History:  Past Medical History:  Diagnosis Date   Arthritis    hands   COVID-19 12/29/2019   Diabetes mellitus without complication (HCC)    type 2   GERD (gastroesophageal  reflux disease)    Hypercholesterolemia    Osteomyelitis of left foot [facility])     Surgical History:  Past Surgical History:  Procedure Laterality Date   ADENOIDECTOMY     AMPUTATION Right 10/07/2020   Procedure: AMPUTATION RAY-Right [place] Ray;  Surgeon: Lennie Barter, DPM;  Location: [facility];  Service: Podiatry;  Laterality: Right;   AMPUTATION Left 01/07/2021   Procedure: AMPUTATION RAY - Left 1st and 2nd Ray;  Surgeon: Lennie Barter, DPM;  Location: [facility];  Service: Podiatry;  Laterality: Left;   AMPUTATION TOE     CATARACT EXTRACTION W/PHACO Right 03/03/2020   Procedure: CATARACT EXTRACTION PHACO AND INTRAOCULAR LENS PLACEMENT (IOC) RIGHT DIABETIC VISION BLUE;  Surgeon: Ferol Rogue, MD;  Location: [facility];  Service: Ophthalmology;  Laterality: Right;  COVID ( + ) 12-29-2019  30.97 02:27.7   CATARACT EXTRACTION W/PHACO Left  06/23/2020   Procedure: CATARACT EXTRACTION PHACO AND INTRAOCULAR LENS PLACEMENT (IOC) LEFT DIABETIC VISION BLUE 5.14  00:34.7;  Surgeon: Ferol Rogue, MD;  Location: [facility];  Service: Ophthalmology;  Laterality: Left;  DIABETIC   ELBOW FRACTURE SURGERY Right    EXTERNAL EAR SURGERY     IRRIGATION AND DEBRIDEMENT ABSCESS Left 06/21/2022   Procedure: IRRIGATION AND DEBRIDEMENT ABSCESS;  Surgeon: Neill Boas, DPM;  Location: [facility];  Service: Podiatry;  Laterality: Left;   IRRIGATION AND DEBRIDEMENT FOOT Right 10/07/2020   Procedure: IRRIGATION AND DEBRIDEMENT FOOT;  Surgeon: Lennie Barter, DPM;  Location: [facility];  Service: Podiatry;  Laterality: Right;   TRANSMETATARSAL AMPUTATION Left 06/21/2022   Procedure: TRANSMETATARSAL AMPUTATION-1st and 2nd;  Surgeon: Neill Boas, DPM;  Location: [facility];  Service: Podiatry;  Laterality: Left;    Medications:  Current Outpatient Medications on File Prior to Visit  Medication Sig   Ascorbic Acid  (VITAMIN C CR) 1500 MG TBCR Take 1,500 mg by mouth daily.   aspirin EC 325 MG tablet Take 325 mg by mouth daily.   B Complex Vitamins (B COMPLEX 1 PO) Take 1 tablet by mouth daily.   Cinnamon 500 MG capsule Take 500 mg by mouth daily.   Garlic 1000 MG CAPS Take 1,000 mg by mouth daily.   No current facility-administered medications on file prior to visit.    Allergies:  No Known Allergies  Social History:  Social History   Socioeconomic History   Marital status: Single    Spouse name: Not on file   Number of children: Not on file   Years of education: Not on file   Highest education level: Not on file  Occupational History   Not on file  Tobacco Use   Smoking status: Never   Smokeless tobacco: Never  Vaping Use   Vaping status: Never Used  Substance and Sexual Activity   Alcohol use: Not Currently    Comment: occasionally   Drug use: No   Sexual activity: Not Currently  Other Topics Concern   Not on file  Social History  Narrative   Not on file   Social Drivers of Health   Tobacco Use: Low Risk (12/30/2024)   Patient History    Smoking Tobacco Use: Never    Smokeless Tobacco Use: Never    Passive Exposure: Not on file  Financial Resource Strain: Low Risk  (08/12/2024)   Received from [facility]   Overall Financial Resource Strain (CARDIA)    Difficulty of Paying Living Expenses: Not hard at all  Food Insecurity: No Food Insecurity (08/12/2024)   Received  from [facility]   Epic    Within the past 12 months, you worried that your food would run out before you got the money to buy more.: Never true    Within the past 12 months, the food you bought just didn't last and you didn't have money to get more.: Never true  Transportation Needs: No Transportation Needs (08/12/2024)   Received from [facility] - Transportation    In the past 12 months, has lack of transportation kept you from medical appointments or from getting medications?: No    Lack of Transportation (Non-Medical): No  Physical Activity: Not on file  Stress: Not on file  Social Connections: Not on file  Intimate Partner Violence: Not on file  Depression (PHQ2-9): Low Risk (12/30/2024)   Depression (PHQ2-9)    PHQ-2 Score: 0  Alcohol Screen: Not on file  Housing: Low Risk  (08/12/2024)   Received from [facility]   Epic    In the last 12 months, was there a time when you were not able to pay the mortgage or rent on time?: No    In the past 12 months, how many times have you moved where you were living?: 0    At any time in the past 12 months, were you homeless or living in a shelter (including now)?: No  Utilities: Not At Risk (08/12/2024)   Received from [facility]   Epic    In the past 12 months has the electric, gas, oil, or water company threatened to shut off services in your home?: No  Health Literacy: Not on file   Social History    Tobacco Use  Smoking Status Never  Smokeless Tobacco Never   Social History   Substance and Sexual Activity  Alcohol Use Not Currently   Comment: occasionally    Family History:  Family History  Problem Relation Age of Onset   Dementia Mother    Hypertension Mother    Cancer Brother        Cancer   Diabetes Maternal Grandmother    Hypertension Maternal Grandmother    COPD Neg Hx    Heart disease Neg Hx    Stroke Neg Hx     Past medical history, surgical history, medications, allergies, family history and social history reviewed with patient today and changes made to appropriate areas of the chart.   Review of Systems  HENT:         Denies vision changes.  Cardiovascular:  Positive for leg swelling.  Neurological:  Negative for tingling.  Endo/Heme/Allergies:  Negative for polydipsia.       Denies Polyuria   All other ROS negative except what is listed above and in the HPI.      Objective:    BP 129/73 (BP Location: Right Arm, Patient Position: Sitting, Cuff Size: Large)   Pulse 76   Temp 97.6 F (36.4 C) (Oral)   Ht 5' 9.09 (1.755 m)   Wt 248 lb 12.8 oz (112.9 kg)   SpO2 96%   BMI 36.64 kg/m   Wt Readings from Last 3 Encounters:  12/30/24 248 lb 12.8 oz (112.9 kg)  09/28/24 250 lb (113.4 kg)  06/23/24 249 lb 3.2 oz (113 kg)    Physical Exam Vitals and nursing note reviewed.  Constitutional:      General: He is not in acute distress.    Appearance: Normal appearance. He is not ill-appearing,  toxic-appearing or diaphoretic.  HENT:     Head: Normocephalic.     Right Ear: Tympanic membrane, ear canal and external ear normal.     Left Ear: Tympanic membrane, ear canal and external ear normal.     Nose: Nose normal. No congestion or rhinorrhea.     Mouth/Throat:     Mouth: Mucous membranes are moist.  Eyes:     General:        Right eye: No discharge.        Left eye: No discharge.     Extraocular Movements: Extraocular movements intact.      Conjunctiva/sclera: Conjunctivae normal.     Pupils: Pupils are equal, round, and reactive to light.  Cardiovascular:     Rate and Rhythm: Normal rate and regular rhythm.     Heart sounds: No murmur heard. Pulmonary:     Effort: Pulmonary effort is normal. No respiratory distress.     Breath sounds: Normal breath sounds. No wheezing, rhonchi or rales.  Abdominal:     General: Abdomen is flat. Bowel sounds are normal. There is no distension.     Palpations: Abdomen is soft.     Tenderness: There is no abdominal tenderness. There is no guarding.  Musculoskeletal:     Cervical back: Normal range of motion and neck supple.     Right lower leg: No edema.     Left lower leg: No edema.  Skin:    General: Skin is warm and dry.     Capillary Refill: Capillary refill takes less than 2 seconds.  Neurological:     General: No focal deficit present.     Mental Status: He is alert and oriented to person, place, and time.     Cranial Nerves: No cranial nerve deficit.     Motor: No weakness.     Deep Tendon Reflexes: Reflexes normal.  Psychiatric:        Mood and Affect: Mood normal.        Behavior: Behavior normal.        Thought Content: Thought content normal.        Judgment: Judgment normal.     Results for orders placed or performed in visit on 09/28/24  Comprehensive metabolic panel with GFR   Collection Time: 09/28/24  3:31 PM  Result Value Ref Range   Glucose 97 70 - 99 mg/dL   BUN 34 (H) 8 - 27 mg/dL   Creatinine, Ser 8.44 (H) 0.76 - 1.27 mg/dL   eGFR 51 (L) >40 fO/fpw/8.26   BUN/Creatinine Ratio 22 10 - 24   Sodium 137 134 - 144 mmol/L   Potassium 4.7 3.5 - 5.2 mmol/L   Chloride 98 96 - 106 mmol/L   CO2 23 20 - 29 mmol/L   Calcium  9.1 8.6 - 10.2 mg/dL   Total Protein 6.6 6.0 - 8.5 g/dL   Albumin 4.4 3.9 - 4.9 g/dL   Globulin, Total 2.2 1.5 - 4.5 g/dL   Bilirubin Total 0.7 0.0 - 1.2 mg/dL   Alkaline Phosphatase 64 47 - 123 IU/L   AST 27 0 - 40 IU/L   ALT 24 0 - 44 IU/L   Hemoglobin A1c   Collection Time: 09/28/24  3:31 PM  Result Value Ref Range   Hgb A1c MFr Bld 6.8 (H) 4.8 - 5.6 %   Est. average glucose Bld gHb Est-mCnc 148 mg/dL  Lipid panel   Collection Time: 09/28/24  3:31 PM  Result Value Ref Range  Cholesterol, Total 162 100 - 199 mg/dL   Triglycerides 804 (H) 0 - 149 mg/dL   HDL 30 (L) >60 mg/dL   VLDL Cholesterol Cal 34 5 - 40 mg/dL   LDL Chol Calc (NIH) 98 0 - 99 mg/dL   Chol/HDL Ratio 5.4 (H) 0.0 - 5.0 ratio  CBC With Diff/Platelet   Collection Time: 09/28/24  3:31 PM  Result Value Ref Range   WBC 9.3 3.4 - 10.8 x10E3/uL   RBC 4.12 (L) 4.14 - 5.80 x10E6/uL   Hemoglobin 13.4 13.0 - 17.7 g/dL   Hematocrit 59.5 62.4 - 51.0 %   MCV 98 (H) 79 - 97 fL   MCH 32.5 26.6 - 33.0 pg   MCHC 33.2 31.5 - 35.7 g/dL   RDW 87.4 88.3 - 84.5 %   Platelets 206 150 - 450 x10E3/uL   Neutrophils 65 Not Estab. %   Lymphs 23 Not Estab. %   Monocytes 7 Not Estab. %   Eos 4 Not Estab. %   Basos 0 Not Estab. %   Neutrophils Absolute 6.1 1.4 - 7.0 x10E3/uL   Lymphocytes Absolute 2.1 0.7 - 3.1 x10E3/uL   Monocytes Absolute 0.6 0.1 - 0.9 x10E3/uL   EOS (ABSOLUTE) 0.3 0.0 - 0.4 x10E3/uL   Basophils Absolute 0.0 0.0 - 0.2 x10E3/uL   Immature Granulocytes 1 Not Estab. %   Immature Grans (Abs) 0.1 0.0 - 0.1 x10E3/uL  Iron Binding Cap (TIBC)([facility])   Collection Time: 09/28/24  3:31 PM  Result Value Ref Range   Total Iron Binding Capacity 245 (L) 250 - 450 ug/dL   UIBC 870 888 - 656 ug/dL   Iron 883 38 - 830 ug/dL   Iron Saturation 47 15 - 55 %      Assessment & Plan:   Problem List Items Addressed This Visit       Cardiovascular and Mediastinum   Essential hypertension   Chronic, Controlled. On Lisinopril  20mg .   Labs ordered at visit today.  Recommend hom BP checks for monitoring and bring log to next visit. Follow up in 6 months.       Relevant Medications   atorvastatin  (LIPITOR) 20 MG tablet   lisinopril  (ZESTRIL ) 20 MG tablet      Endocrine   Hyperlipidemia associated with type 2 diabetes mellitus (HCC)   Chronic.  Controlled.  Continue with current medication regimen of Atorvastatin  20mg  daily.  Refills sent today.  Labs ordered today.  Return to clinic in 6 months for reevaluation.  Call sooner if concerns arise.       Relevant Medications   atorvastatin  (LIPITOR) 20 MG tablet   glipiZIDE  (GLUCOTROL ) 5 MG tablet   lisinopril  (ZESTRIL ) 20 MG tablet   metFORMIN  (GLUCOPHAGE ) 1000 MG tablet   Other Relevant Orders   Lipid panel   Diabetes mellitus treated with insulin  and oral medication (HCC)   Chronic.  Controlled.  Last A1c was 6.8%.  Has made diet changes since last visit.  Sugars running 90-125.  Continue with current medication regimen of Glipizide  and Metformin .  If elevated can add GLP1.  Eye exam up to date.  Labs ordered today.  Return to clinic in 6 months for reevaluation.  Call sooner if concerns arise.  Followed by Podiatry.  Last saw specialist in December.        Relevant Medications   atorvastatin  (LIPITOR) 20 MG tablet   glipiZIDE  (GLUCOTROL ) 5 MG tablet   lisinopril  (ZESTRIL ) 20 MG tablet   metFORMIN  (GLUCOPHAGE ) 1000 MG  tablet   Other Relevant Orders   Comprehensive metabolic panel with GFR   Hemoglobin A1c   Microalbumin, Urine Waived     Genitourinary   CKD stage 3a, GFR 45-59 ml/min (HCC)   Chronic.  Controlled. Continue with Lisinopril  for Kidney protection.    Labs ordered today.  Return to clinic in 6 months for reevaluation.  Call sooner if concerns arise.          Other   History of transmetatarsal amputation of left foot (HCC)   Followed by Podiatry. Last visit was 12/25. Continue to follow up with specialist.      Anemia of chronic disease   Chronic.  Controlled.  Continue with current medication regimen.  Labs ordered today.  Return to clinic in 6 months for reevaluation.  Call sooner if concerns arise.        Relevant Orders   CBC With Diff/Platelet   Iron  Binding Cap (TIBC)([facility])   Other Visit Diagnoses       Annual physical exam    -  Primary   Health maintenance reviewed during visit today.  Labs ordered.  Vaccines reviewed.  Cologuard up to date.   Relevant Orders   PSA   TSH     Type 2 diabetes mellitus with diabetic neuropathy, with long-term current use of insulin  (HCC)       Relevant Medications   atorvastatin  (LIPITOR) 20 MG tablet   glipiZIDE  (GLUCOTROL ) 5 MG tablet   lisinopril  (ZESTRIL ) 20 MG tablet   metFORMIN  (GLUCOPHAGE ) 1000 MG tablet        Discussed aspirin prophylaxis for myocardial infarction prevention and decision was made to continue ASA  LABORATORY TESTING:  Health maintenance labs ordered today as discussed above.   The natural history of prostate cancer and ongoing controversy regarding screening and potential treatment outcomes of prostate cancer has been discussed with the patient. The meaning of a false positive PSA and a false negative PSA has been discussed. He indicates understanding of the limitations of this screening test and wishes to proceed with screening PSA testing.   IMMUNIZATIONS:   - Tdap: Tetanus vaccination status reviewed: last tetanus booster within 10 years. - Influenza: Refused - Pneumovax: Up to date - Prevnar: Not applicable - COVID: Refused - HPV: Not applicable - Shingrix vaccine: Refused  SCREENING: - Colonoscopy: Up to date  Discussed with patient purpose of the colonoscopy is to detect colon cancer at curable precancerous or early stages   - AAA Screening: Not applicable  -Hearing Test: Not applicable  -Spirometry: Not applicable   PATIENT COUNSELING:    Sexuality: Discussed sexually transmitted diseases, partner selection, use of condoms, avoidance of unintended pregnancy  and contraceptive alternatives.   Advised to avoid cigarette smoking.  I discussed with the patient that most people either abstain from alcohol or drink within safe limits  (<=14/week and <=4 drinks/occasion for males, <=7/weeks and <= 3 drinks/occasion for females) and that the risk for alcohol disorders and other health effects rises proportionally with the number of drinks per week and how often a drinker exceeds daily limits.  Discussed cessation/primary prevention of drug use and availability of treatment for abuse.   Diet: Encouraged to adjust caloric intake to maintain  or achieve ideal body weight, to reduce intake of dietary saturated fat and total fat, to limit sodium intake by avoiding high sodium foods and not adding table salt, and to maintain adequate dietary potassium and calcium  preferably from fresh fruits, vegetables, and low-fat  dairy products.    stressed the importance of regular exercise  Injury prevention: Discussed safety belts, safety helmets, smoke detector, smoking near bedding or upholstery.   Dental health: Discussed importance of regular tooth brushing, flossing, and dental visits.   Follow up plan: NEXT PREVENTATIVE PHYSICAL DUE IN 1 YEAR. Return in about 6 months (around 06/29/2025) for HTN, HLD, DM2 FU.  "

## 2024-12-30 NOTE — Assessment & Plan Note (Signed)
 Chronic.  Controlled.  Continue with current medication regimen.  Labs ordered today.  Return to clinic in 6 months for reevaluation.  Call sooner if concerns arise.  ? ?

## 2024-12-31 ENCOUNTER — Ambulatory Visit: Payer: Self-pay | Admitting: Nurse Practitioner

## 2024-12-31 LAB — CBC WITH DIFF/PLATELET
Basophils Absolute: 0.1 x10E3/uL (ref 0.0–0.2)
Basos: 1 %
EOS (ABSOLUTE): 0.3 x10E3/uL (ref 0.0–0.4)
Eos: 4 %
Hematocrit: 38 % (ref 37.5–51.0)
Hemoglobin: 12.4 g/dL — ABNORMAL LOW (ref 13.0–17.7)
Immature Grans (Abs): 0 x10E3/uL (ref 0.0–0.1)
Immature Granulocytes: 0 %
Lymphocytes Absolute: 2.1 x10E3/uL (ref 0.7–3.1)
Lymphs: 25 %
MCH: 31.7 pg (ref 26.6–33.0)
MCHC: 32.6 g/dL (ref 31.5–35.7)
MCV: 97 fL (ref 79–97)
Monocytes Absolute: 0.5 x10E3/uL (ref 0.1–0.9)
Monocytes: 6 %
Neutrophils Absolute: 5.5 x10E3/uL (ref 1.4–7.0)
Neutrophils: 64 %
Platelets: 236 x10E3/uL (ref 150–450)
RBC: 3.91 x10E6/uL — ABNORMAL LOW (ref 4.14–5.80)
RDW: 12.7 % (ref 11.6–15.4)
WBC: 8.5 x10E3/uL (ref 3.4–10.8)

## 2024-12-31 LAB — TSH: TSH: 1.55 u[IU]/mL (ref 0.450–4.500)

## 2024-12-31 LAB — COMPREHENSIVE METABOLIC PANEL WITH GFR
ALT: 18 IU/L (ref 0–44)
AST: 19 IU/L (ref 0–40)
Albumin: 4.3 g/dL (ref 3.9–4.9)
Alkaline Phosphatase: 61 IU/L (ref 47–123)
BUN/Creatinine Ratio: 18 (ref 10–24)
BUN: 24 mg/dL (ref 8–27)
Bilirubin Total: 0.6 mg/dL (ref 0.0–1.2)
CO2: 23 mmol/L (ref 20–29)
Calcium: 9.2 mg/dL (ref 8.6–10.2)
Chloride: 97 mmol/L (ref 96–106)
Creatinine, Ser: 1.3 mg/dL — ABNORMAL HIGH (ref 0.76–1.27)
Globulin, Total: 1.9 g/dL (ref 1.5–4.5)
Glucose: 104 mg/dL — ABNORMAL HIGH (ref 70–99)
Potassium: 4.5 mmol/L (ref 3.5–5.2)
Sodium: 134 mmol/L (ref 134–144)
Total Protein: 6.2 g/dL (ref 6.0–8.5)
eGFR: 63 mL/min/1.73

## 2024-12-31 LAB — IRON AND TIBC
Iron Saturation: 30 % (ref 15–55)
Iron: 70 ug/dL (ref 38–169)
Total Iron Binding Capacity: 235 ug/dL — ABNORMAL LOW (ref 250–450)
UIBC: 165 ug/dL (ref 111–343)

## 2024-12-31 LAB — LIPID PANEL
Chol/HDL Ratio: 5.2 ratio — ABNORMAL HIGH (ref 0.0–5.0)
Cholesterol, Total: 167 mg/dL (ref 100–199)
HDL: 32 mg/dL — ABNORMAL LOW
LDL Chol Calc (NIH): 105 mg/dL — ABNORMAL HIGH (ref 0–99)
Triglycerides: 168 mg/dL — ABNORMAL HIGH (ref 0–149)
VLDL Cholesterol Cal: 30 mg/dL (ref 5–40)

## 2024-12-31 LAB — HEMOGLOBIN A1C
Est. average glucose Bld gHb Est-mCnc: 154 mg/dL
Hgb A1c MFr Bld: 7 % — ABNORMAL HIGH (ref 4.8–5.6)

## 2024-12-31 LAB — PSA: Prostate Specific Ag, Serum: 4 ng/mL (ref 0.0–4.0)

## 2025-06-30 ENCOUNTER — Ambulatory Visit: Admitting: Nurse Practitioner
